# Patient Record
Sex: Male | Born: 1975 | Race: White | Hispanic: No | Smoking: Former smoker
Health system: Southern US, Community
[De-identification: ages and names within clinical notes are randomized; demographics above are authoritative.]

## PROBLEM LIST (undated history)

## (undated) DIAGNOSIS — M109 Gout, unspecified: Secondary | ICD-10-CM

## (undated) DIAGNOSIS — G6181 Chronic inflammatory demyelinating polyneuritis: Secondary | ICD-10-CM

## (undated) DIAGNOSIS — F1010 Alcohol abuse, uncomplicated: Secondary | ICD-10-CM

## (undated) HISTORY — PX: FOOT SURGERY: SHX648

---

## 2001-02-12 ENCOUNTER — Encounter: Payer: Self-pay | Admitting: Podiatry

## 2001-02-16 ENCOUNTER — Encounter: Payer: Self-pay | Admitting: Podiatry

## 2001-02-16 ENCOUNTER — Ambulatory Visit (HOSPITAL_COMMUNITY): Admission: RE | Admit: 2001-02-16 | Discharge: 2001-02-16 | Payer: Self-pay | Admitting: Podiatry

## 2006-06-29 ENCOUNTER — Emergency Department (HOSPITAL_COMMUNITY): Admission: EM | Admit: 2006-06-29 | Discharge: 2006-06-29 | Payer: Self-pay | Admitting: Emergency Medicine

## 2014-01-29 ENCOUNTER — Emergency Department (HOSPITAL_COMMUNITY): Payer: 59

## 2014-01-29 ENCOUNTER — Emergency Department (HOSPITAL_COMMUNITY)
Admission: EM | Admit: 2014-01-29 | Discharge: 2014-01-29 | Disposition: A | Discharge status: Home / Self Care | Payer: 59 | Attending: Emergency Medicine | Admitting: Emergency Medicine

## 2014-01-29 ENCOUNTER — Encounter (HOSPITAL_COMMUNITY): Payer: Self-pay | Admitting: Emergency Medicine

## 2014-01-29 DIAGNOSIS — Z87891 Personal history of nicotine dependence: Secondary | ICD-10-CM | POA: Insufficient documentation

## 2014-01-29 DIAGNOSIS — M25469 Effusion, unspecified knee: Secondary | ICD-10-CM | POA: Insufficient documentation

## 2014-01-29 DIAGNOSIS — M25461 Effusion, right knee: Secondary | ICD-10-CM

## 2014-01-29 DIAGNOSIS — Z862 Personal history of diseases of the blood and blood-forming organs and certain disorders involving the immune mechanism: Secondary | ICD-10-CM | POA: Insufficient documentation

## 2014-01-29 DIAGNOSIS — Z8639 Personal history of other endocrine, nutritional and metabolic disease: Secondary | ICD-10-CM | POA: Insufficient documentation

## 2014-01-29 HISTORY — DX: Gout, unspecified: M10.9

## 2014-01-29 MED ORDER — IBUPROFEN 800 MG PO TABS
800.0000 mg | ORAL_TABLET | Freq: Once | ORAL | Status: AC
  Administered 2014-01-29: 800 mg via ORAL
  Filled 2014-01-29: qty 1

## 2014-01-29 MED ORDER — PREDNISONE 50 MG PO TABS: 50.0000 mg | ORAL_TABLET | Freq: Every day | ORAL | Status: DC

## 2014-01-29 MED ORDER — ACETAMINOPHEN 325 MG PO TABS
975.0000 mg | ORAL_TABLET | Freq: Once | ORAL | Status: AC
  Administered 2014-01-29: 975 mg via ORAL
  Filled 2014-01-29: qty 3

## 2014-01-29 MED ORDER — IBUPROFEN 800 MG PO TABS: 800.0000 mg | ORAL_TABLET | Freq: Three times a day (TID) | ORAL | Status: DC | PRN

## 2014-01-29 NOTE — Discharge Instructions (Signed)
Return here as needed.  Followup with the orthopedist provided for further care and evaluation.  Ice and elevate her knee and wear the knee immobilizer and use crutches.

## 2014-01-29 NOTE — ED Notes (Signed)
Ortho tech at bedside 

## 2014-01-29 NOTE — ED Notes (Signed)
Patient reports that he ran on a treadmill 2 days ago and that afternoon his right knee was swollen.

## 2014-01-29 NOTE — ED Provider Notes (Signed)
CSN: [id number]     Arrival date & time 01/29/14  1039 History   First MD Initiated Contact with Patient 01/29/14 1114     Chief Complaint  Patient presents with  . Joint Swelling     (Consider location/radiation/quality/duration/timing/severity/associated sxs/prior Treatment) HPI Patient presents to the emergency department with swelling to his right knee that started 2 days, ago.  The patient, states, that he was using a treadmill and shortly thereafter his noticed his knee became stiff.  Patient, states he did not fall or injure the knee in any way.  The patient didn't feel any pain while running.  The patient denies nausea, vomiting, shortness of breath, numbness, weakness, dizziness, fever, back pain, or syncope.  The patient, states, that he's had a history of gout in the past and this feels somewhat similar but not completely like his previous gout episodes. Past Medical History  Diagnosis Date  . Gout    Past Surgical History  Procedure Laterality Date  . Foot surgery     Family History  Problem Relation Age of Onset  . COPD Father    History  Substance Use Topics  . Smoking status: Former Games developermoker  . Smokeless tobacco: Never Used  . Alcohol Use: No    Review of Systems  All other systems negative except as documented in the HPI. All pertinent positives and negatives as reviewed in the HPI.   Allergies  Review of patient's allergies indicates no known allergies.  Home Medications   Prior to Admission medications   Medication Sig Start Date End Date Taking? Authorizing Provider  ibuprofen (ADVIL,MOTRIN) 200 MG tablet Take 800 mg by mouth every 6 (six) hours as needed for moderate pain.   Yes Historical Provider, MD  Multiple Vitamin (MULTIVITAMIN WITH MINERALS) TABS tablet Take 1 tablet by mouth daily.   Yes Historical Provider, MD   BP 127/74  Pulse 64  Temp(Src) 98.9 F (37.2 C) (Oral)  Resp 18  Ht 6\' 2"  (1.88 m)  Wt 240 lb (108.863 kg)  BMI 30.80 kg/m2   SpO2 95% Physical Exam  Nursing note and vitals reviewed. Constitutional: He is oriented to person, place, and time. He appears well-developed and well-nourished. No distress.  HENT:  Head: Normocephalic and atraumatic.  Eyes: Pupils are equal, round, and reactive to light.  Pulmonary/Chest: Effort normal. No respiratory distress.  Musculoskeletal:       Right knee: He exhibits decreased range of motion, swelling and effusion. He exhibits no ecchymosis, no deformity, no laceration, no erythema, normal alignment, no LCL laxity, normal patellar mobility, no bony tenderness, normal meniscus and no MCL laxity. Tenderness found. Medial joint line and lateral joint line tenderness noted.  Neurological: He is alert and oriented to person, place, and time. He exhibits normal muscle tone. Coordination normal.  Skin: Skin is warm and dry. No erythema.    ED Course  Procedures (including critical care time) Labs Review Labs Reviewed - No data to display  Imaging Review Dg Knee Complete 4 Views Right  01/29/2014   CLINICAL DATA:  pain and swelling a  EXAM: RIGHT KNEE - COMPLETE 4+ VIEW  COMPARISON:  None.  FINDINGS: There is no evidence of fracture or dislocation. Moderate effusion in the suprapatellar bursa. Ossicles at the tibial tubercle. There is no evidence of arthropathy or other focal bone abnormality. Soft tissues are unremarkable.  IMPRESSION: 1. Moderate joint effusion without fracture or other acute bone abnormality.   Electronically Signed   By: Oley Balmaniel  Hassell  M.D.   On: 01/29/2014 11:54     Patient be placed in a knee immobilizer and given crutches.  Told to ice and elevate the knee.  Patient will be referred to orthopedics for further evaluation and care.  Told to return here as needed.  Prescription represent a bursitis versus gout flare.  Carlyle Dollyhristopher W Edie Darley, PA-C 01/29/14 1232

## 2014-01-29 NOTE — ED Provider Notes (Signed)
Medical screening examination/treatment/procedure(s) were performed by non-physician practitioner and as supervising physician I was immediately available for consultation/collaboration.   EKG Interpretation None      Devoria AlbeIva Knapp, MD, Armando GangFACEPWard Givens'  Iva L Knapp, MD 01/29/14 [phone]

## 2014-01-29 NOTE — ED Notes (Addendum)
Per pt on treadmill 2 days ago with increased stiffness and swelling to right knee since event. No redness but slight warmth at site.   PA at  Bedside and aware.

## 2014-01-29 NOTE — ED Notes (Signed)
Pt states he does not want anything additional for pain.

## 2014-01-29 NOTE — ED Notes (Signed)
Pt transported to xray. Will administer pain medication and ice with pt return.

## 2014-11-01 ENCOUNTER — Encounter: Payer: Self-pay | Admitting: Orthopedic Surgery

## 2015-02-09 IMAGING — CR DG KNEE COMPLETE 4+V*R*
4 series · 4 of 4 positions shown · non-contrast
Comparison: None.

CLINICAL DATA: pain and swelling a

EXAM:
RIGHT KNEE - COMPLETE 4+ VIEW

[t knee obl right (1 of 3)]
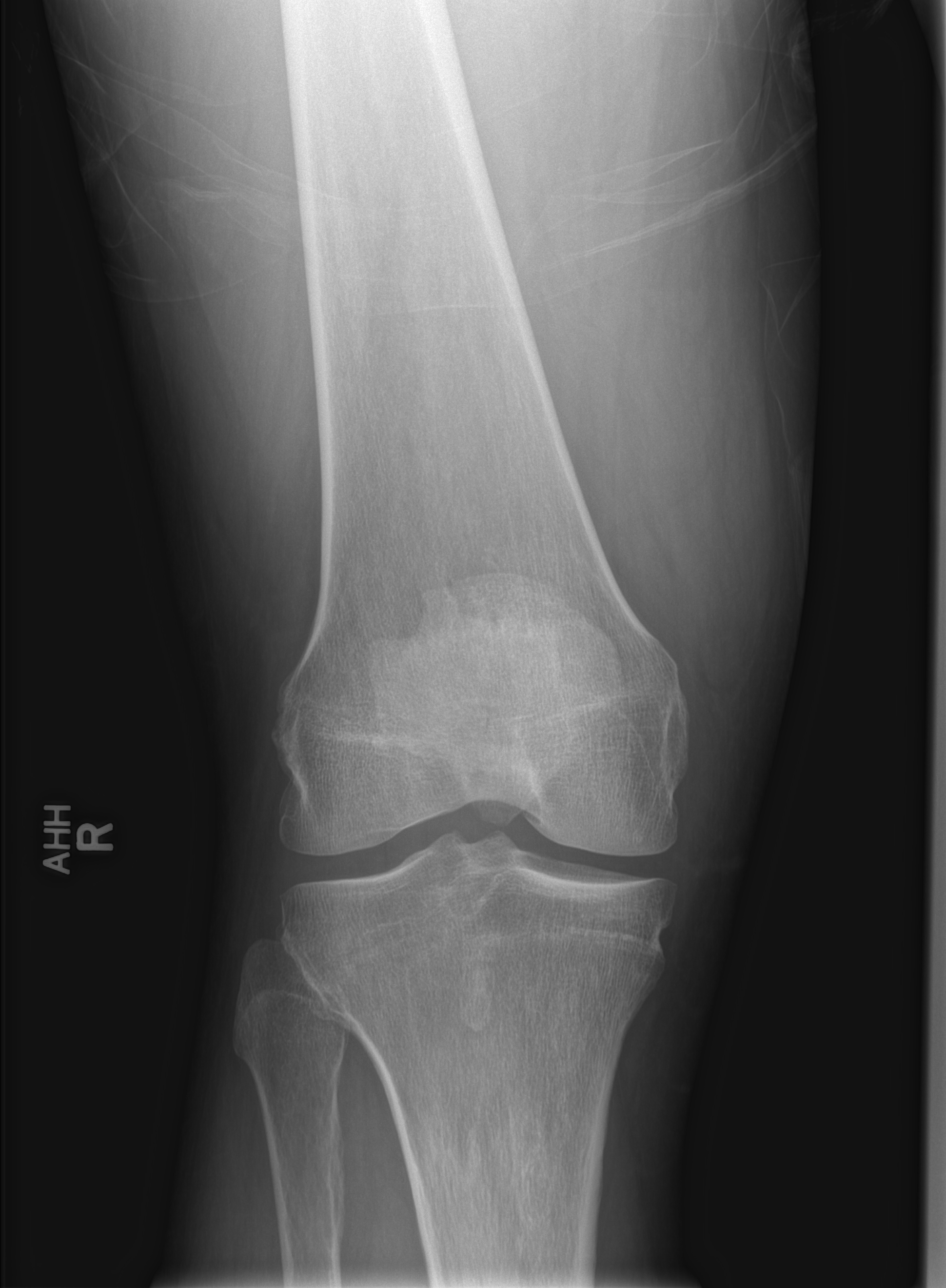

[t knee obl right (2 of 3)]
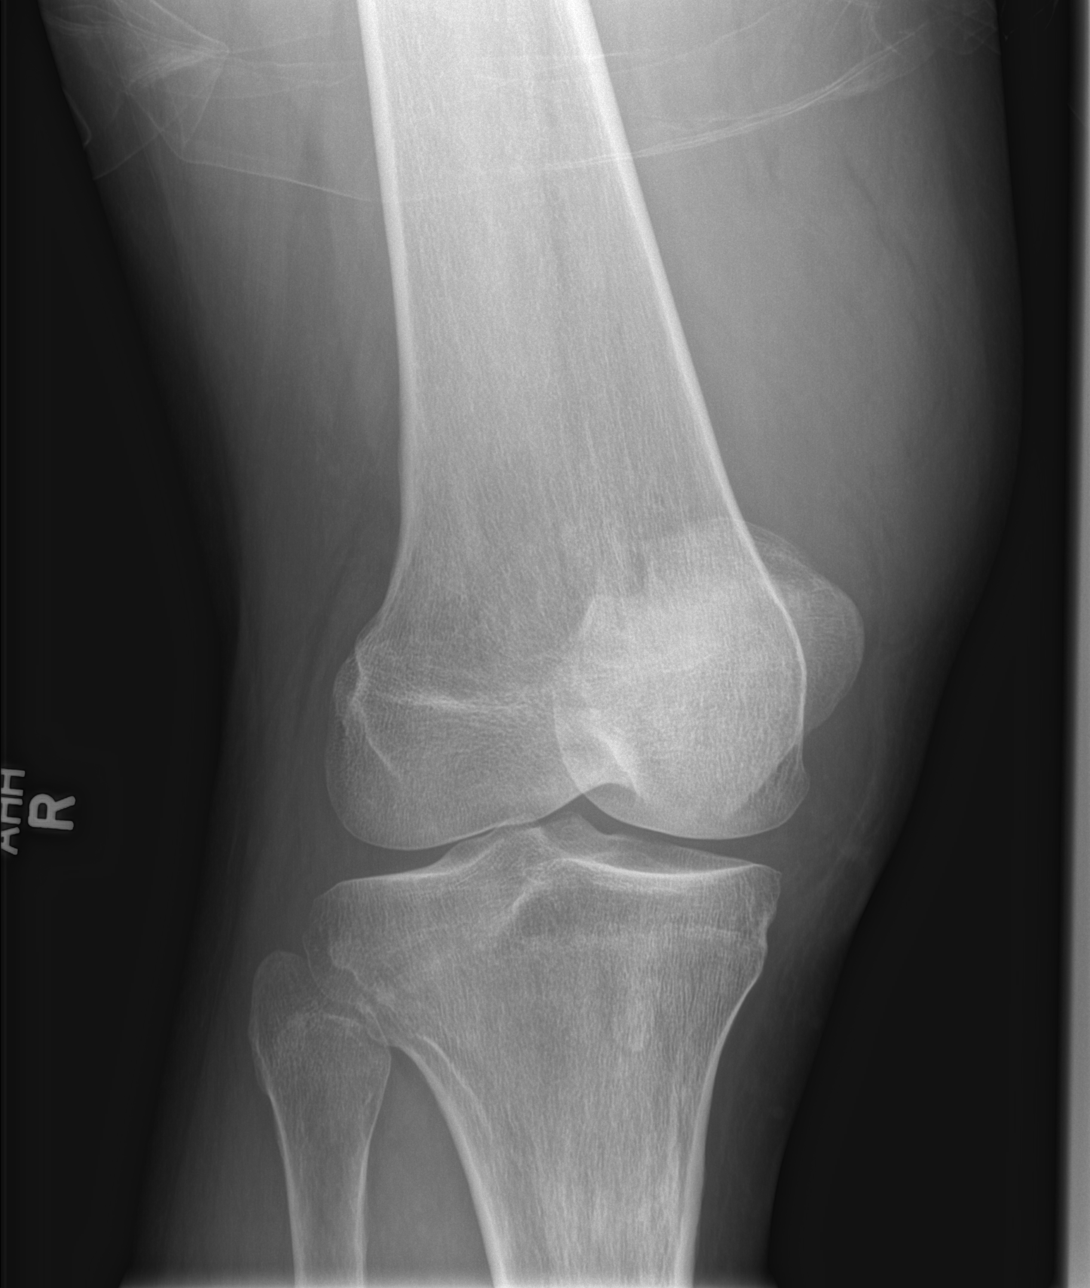

[t knee obl right (3 of 3)]
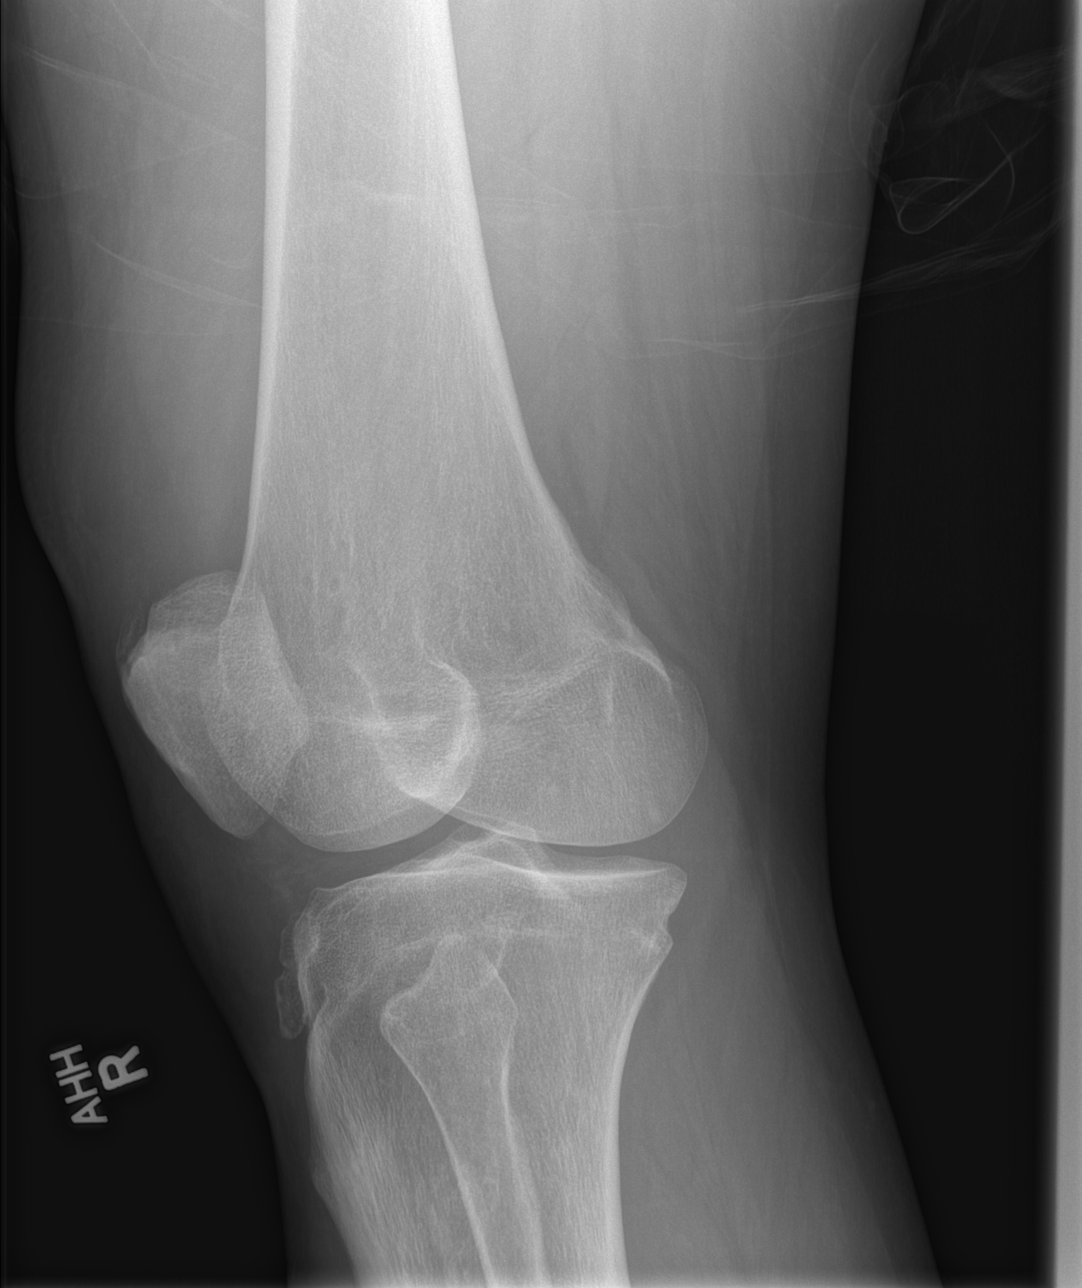

[t knee lat right]
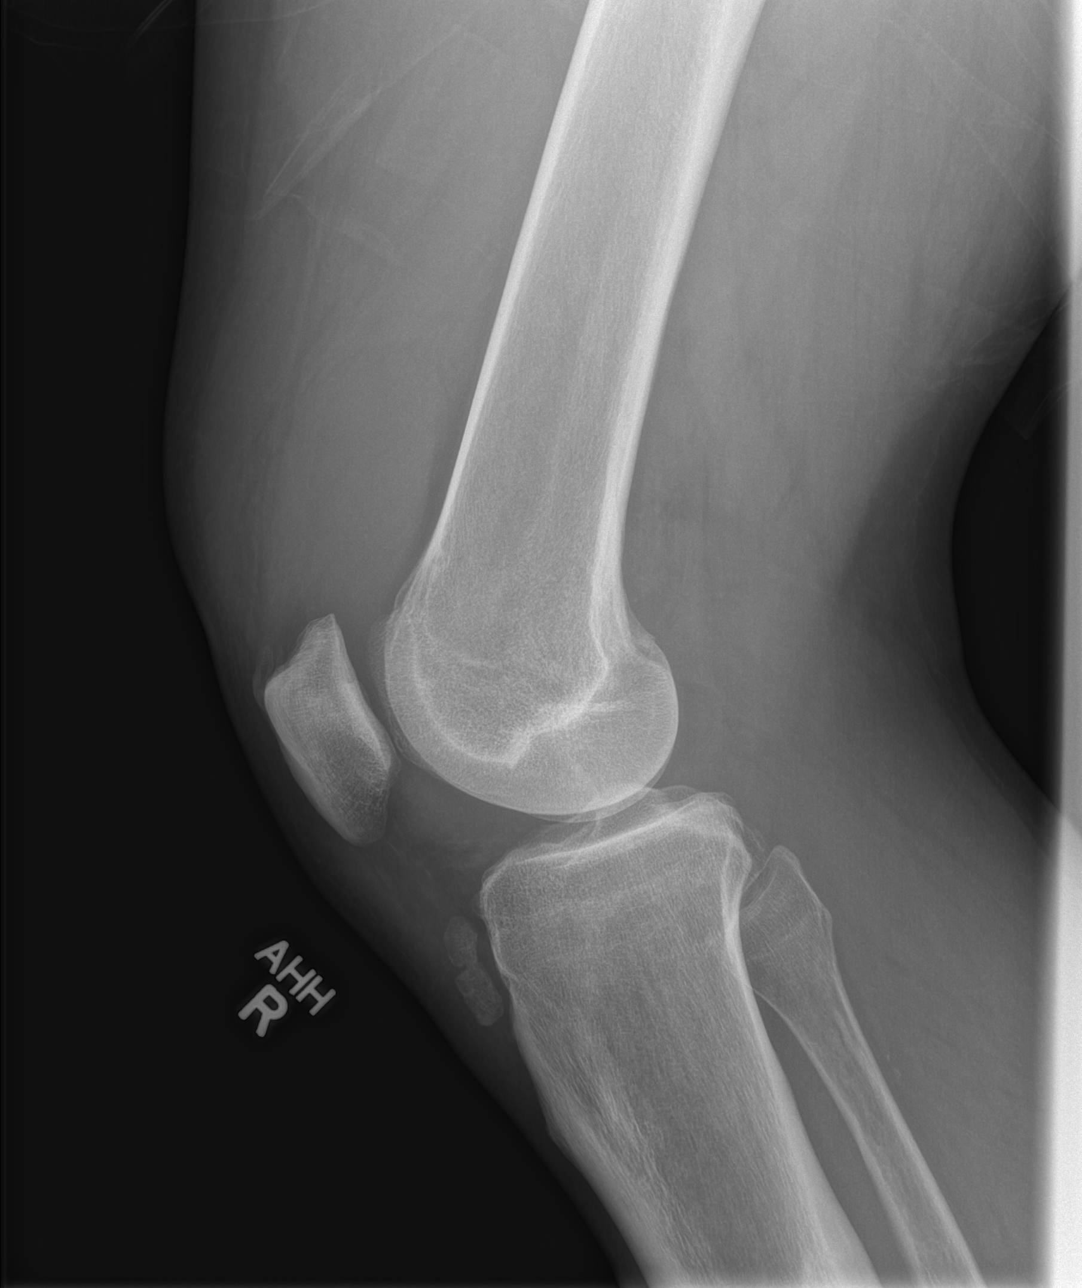

[4 of 4 positions shown; findings below may reference images not displayed]

FINDINGS: There is no evidence of fracture or dislocation. Moderate effusion
in the suprapatellar bursa. Ossicles at the tibial tubercle. There
is no evidence of arthropathy or other focal bone abnormality. Soft
tissues are unremarkable.
IMPRESSION: 1. Moderate joint effusion without fracture or other acute bone
abnormality.

## 2016-09-30 DIAGNOSIS — M109 Gout, unspecified: Secondary | ICD-10-CM | POA: Diagnosis not present

## 2016-11-06 DIAGNOSIS — M109 Gout, unspecified: Secondary | ICD-10-CM | POA: Diagnosis not present

## 2016-11-06 DIAGNOSIS — R7301 Impaired fasting glucose: Secondary | ICD-10-CM | POA: Diagnosis not present

## 2016-11-06 DIAGNOSIS — E782 Mixed hyperlipidemia: Secondary | ICD-10-CM | POA: Diagnosis not present

## 2016-11-08 DIAGNOSIS — R7301 Impaired fasting glucose: Secondary | ICD-10-CM | POA: Diagnosis not present

## 2016-11-08 DIAGNOSIS — E782 Mixed hyperlipidemia: Secondary | ICD-10-CM | POA: Diagnosis not present

## 2016-11-08 DIAGNOSIS — Z0000 Encounter for general adult medical examination without abnormal findings: Secondary | ICD-10-CM | POA: Diagnosis not present

## 2017-01-22 DIAGNOSIS — M109 Gout, unspecified: Secondary | ICD-10-CM | POA: Diagnosis not present

## 2017-02-06 DIAGNOSIS — E782 Mixed hyperlipidemia: Secondary | ICD-10-CM | POA: Diagnosis not present

## 2017-02-06 DIAGNOSIS — R7301 Impaired fasting glucose: Secondary | ICD-10-CM | POA: Diagnosis not present

## 2017-02-06 DIAGNOSIS — R5383 Other fatigue: Secondary | ICD-10-CM | POA: Diagnosis not present

## 2017-02-10 DIAGNOSIS — E782 Mixed hyperlipidemia: Secondary | ICD-10-CM | POA: Diagnosis not present

## 2017-02-10 DIAGNOSIS — R7301 Impaired fasting glucose: Secondary | ICD-10-CM | POA: Diagnosis not present

## 2017-02-10 DIAGNOSIS — E790 Hyperuricemia without signs of inflammatory arthritis and tophaceous disease: Secondary | ICD-10-CM | POA: Diagnosis not present

## 2017-08-13 DIAGNOSIS — R7301 Impaired fasting glucose: Secondary | ICD-10-CM | POA: Diagnosis not present

## 2017-08-13 DIAGNOSIS — M109 Gout, unspecified: Secondary | ICD-10-CM | POA: Diagnosis not present

## 2017-08-13 DIAGNOSIS — E782 Mixed hyperlipidemia: Secondary | ICD-10-CM | POA: Diagnosis not present

## 2017-08-13 DIAGNOSIS — E790 Hyperuricemia without signs of inflammatory arthritis and tophaceous disease: Secondary | ICD-10-CM | POA: Diagnosis not present

## 2017-08-13 DIAGNOSIS — E6609 Other obesity due to excess calories: Secondary | ICD-10-CM | POA: Diagnosis not present

## 2018-01-19 DIAGNOSIS — R7301 Impaired fasting glucose: Secondary | ICD-10-CM | POA: Diagnosis not present

## 2018-01-19 DIAGNOSIS — E782 Mixed hyperlipidemia: Secondary | ICD-10-CM | POA: Diagnosis not present

## 2018-01-19 DIAGNOSIS — Z125 Encounter for screening for malignant neoplasm of prostate: Secondary | ICD-10-CM | POA: Diagnosis not present

## 2018-01-22 DIAGNOSIS — R7301 Impaired fasting glucose: Secondary | ICD-10-CM | POA: Diagnosis not present

## 2018-01-22 DIAGNOSIS — E790 Hyperuricemia without signs of inflammatory arthritis and tophaceous disease: Secondary | ICD-10-CM | POA: Diagnosis not present

## 2018-05-07 DIAGNOSIS — Z0289 Encounter for other administrative examinations: Secondary | ICD-10-CM | POA: Diagnosis not present

## 2018-08-12 DIAGNOSIS — R7301 Impaired fasting glucose: Secondary | ICD-10-CM | POA: Diagnosis not present

## 2018-08-12 DIAGNOSIS — E6609 Other obesity due to excess calories: Secondary | ICD-10-CM | POA: Diagnosis not present

## 2018-08-12 DIAGNOSIS — E782 Mixed hyperlipidemia: Secondary | ICD-10-CM | POA: Diagnosis not present

## 2018-08-24 DIAGNOSIS — Z0000 Encounter for general adult medical examination without abnormal findings: Secondary | ICD-10-CM | POA: Diagnosis not present

## 2018-08-24 DIAGNOSIS — E782 Mixed hyperlipidemia: Secondary | ICD-10-CM | POA: Diagnosis not present

## 2018-08-24 DIAGNOSIS — M109 Gout, unspecified: Secondary | ICD-10-CM | POA: Diagnosis not present

## 2018-08-24 DIAGNOSIS — E669 Obesity, unspecified: Secondary | ICD-10-CM | POA: Diagnosis not present

## 2022-05-24 ENCOUNTER — Other Ambulatory Visit (HOSPITAL_COMMUNITY): Payer: Self-pay

## 2022-05-24 MED ORDER — WEGOVY 0.25 MG/0.5ML ~~LOC~~ SOAJ
0.2500 mg | SUBCUTANEOUS | 0 refills | Status: DC
  Filled 2022-05-24 – 2022-05-31 (×2): qty 2, 28d supply, fill #0

## 2022-05-24 MED ORDER — WEGOVY 1 MG/0.5ML ~~LOC~~ SOAJ
1.0000 mg | SUBCUTANEOUS | 2 refills | Status: DC
  Filled 2022-05-24 – 2022-05-31 (×2): qty 2, 28d supply, fill #0

## 2022-05-24 MED ORDER — WEGOVY 0.5 MG/0.5ML ~~LOC~~ SOAJ
0.5000 mg | SUBCUTANEOUS | 0 refills | Status: DC
  Filled 2022-05-24 – 2022-06-04 (×2): qty 2, 28d supply, fill #0

## 2022-05-31 ENCOUNTER — Other Ambulatory Visit (HOSPITAL_COMMUNITY): Payer: Self-pay

## 2022-06-04 ENCOUNTER — Other Ambulatory Visit (HOSPITAL_COMMUNITY): Payer: Self-pay

## 2022-09-24 ENCOUNTER — Other Ambulatory Visit (HOSPITAL_COMMUNITY): Payer: Self-pay | Admitting: Internal Medicine

## 2022-09-24 DIAGNOSIS — E782 Mixed hyperlipidemia: Secondary | ICD-10-CM

## 2022-11-14 ENCOUNTER — Ambulatory Visit (HOSPITAL_COMMUNITY)
Admission: RE | Admit: 2022-11-14 | Discharge: 2022-11-14 | Disposition: A | Discharge status: Home / Self Care | Payer: Self-pay | Source: Ambulatory Visit | Attending: Internal Medicine | Admitting: Internal Medicine

## 2022-11-14 DIAGNOSIS — E782 Mixed hyperlipidemia: Secondary | ICD-10-CM | POA: Insufficient documentation

## 2022-12-10 ENCOUNTER — Other Ambulatory Visit (HOSPITAL_COMMUNITY): Payer: Self-pay | Admitting: Internal Medicine

## 2022-12-10 ENCOUNTER — Encounter (HOSPITAL_COMMUNITY): Payer: Self-pay | Admitting: Internal Medicine

## 2022-12-10 DIAGNOSIS — R945 Abnormal results of liver function studies: Secondary | ICD-10-CM

## 2022-12-11 ENCOUNTER — Encounter: Payer: Self-pay | Admitting: Internal Medicine

## 2022-12-11 ENCOUNTER — Ambulatory Visit: Payer: 59 | Attending: Internal Medicine | Admitting: Internal Medicine

## 2022-12-11 VITALS — BP 128/88 | HR 101 | Ht 74.0 in | Wt 245.2 lb

## 2022-12-11 DIAGNOSIS — E8809 Other disorders of plasma-protein metabolism, not elsewhere classified: Secondary | ICD-10-CM

## 2022-12-11 DIAGNOSIS — Z136 Encounter for screening for cardiovascular disorders: Secondary | ICD-10-CM

## 2022-12-11 DIAGNOSIS — E785 Hyperlipidemia, unspecified: Secondary | ICD-10-CM | POA: Insufficient documentation

## 2022-12-11 DIAGNOSIS — E46 Unspecified protein-calorie malnutrition: Secondary | ICD-10-CM | POA: Insufficient documentation

## 2022-12-11 DIAGNOSIS — M7989 Other specified soft tissue disorders: Secondary | ICD-10-CM | POA: Diagnosis not present

## 2022-12-11 DIAGNOSIS — Z8249 Family history of ischemic heart disease and other diseases of the circulatory system: Secondary | ICD-10-CM | POA: Insufficient documentation

## 2022-12-11 DIAGNOSIS — E7849 Other hyperlipidemia: Secondary | ICD-10-CM | POA: Diagnosis not present

## 2022-12-11 DIAGNOSIS — G603 Idiopathic progressive neuropathy: Secondary | ICD-10-CM

## 2022-12-11 DIAGNOSIS — G629 Polyneuropathy, unspecified: Secondary | ICD-10-CM | POA: Insufficient documentation

## 2022-12-11 DIAGNOSIS — E781 Pure hyperglyceridemia: Secondary | ICD-10-CM | POA: Insufficient documentation

## 2022-12-11 NOTE — Progress Notes (Signed)
Cardiology Office Note  Date: 12/11/2022   ID: CUTBERTO RICCA, DOB [birth year], MRN [id number]  PCP:  Donnetta Hail, MD  Cardiologist:  Marjo Bicker, MD Electrophysiologist:  None   Reason for Office Visit: CAD screening at the request of Dr. Margo Aye   History of Present Illness: Connor Copeland is a 47 y.o. male with no PMH was referred to cardiology clinic for CAD screening.  Patient has family history of CAD but not premature. His father had PCI/CABG in his late 31s.  Patient denied having any symptoms of angina, DOE, syncope, palpitations.  He does have leg swelling for some time. 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  Recently, in the last 3 to 6 months, he started noticing burning, tingling, paresthesias in bilateral lower extremities making him hard to walk.  He used to smoke cigarettes but not anymore.  Past Medical History:  Diagnosis Date   Gout     Current Outpatient Medications  Medication Sig Dispense Refill   Multiple Vitamin (MULTIVITAMIN WITH MINERALS) TABS tablet Take 1 tablet by mouth daily.     No current facility-administered medications for this visit.   Allergies:  Patient has no known allergies.   Social History: The patient  reports that he has quit smoking. He has never used smokeless tobacco. He reports that he does not drink alcohol and does not use drugs.   Family History: The patient's family history includes COPD in his father.   ROS:  Please see the history of present illness. Otherwise, complete review of systems is positive for none  All other systems are reviewed and negative.   Physical Exam: VS:  BP 128/88   Pulse (!) 101   Ht 6\' 2"  (1.88 m)   Wt 245 lb 3.2 oz (111.2 kg)   SpO2 96%   BMI 31.48 kg/m , BMI Body mass index is 31.48 kg/m.  Wt Readings from Last 3 Encounters:  12/11/22 245 lb 3.2 oz (111.2 kg)  01/29/14 240 lb (108.9 kg)    General: Patient appears  comfortable at rest. HEENT: Conjunctiva and lids normal, oropharynx clear with moist mucosa. Neck: Supple, no elevated JVP or carotid bruits, no thyromegaly. Lungs: Clear to auscultation, nonlabored breathing at rest. Cardiac: Regular rate and rhythm, no S3 or significant systolic murmur, no pericardial rub. Abdomen: Soft, nontender, no hepatomegaly, bowel sounds present, no guarding or rebound. Extremities: 1-2+ pitting edema, distal pulses 2+. Skin: Warm and dry. Musculoskeletal: No kyphosis. Neuropsychiatric: Alert and oriented x3, affect grossly appropriate.  Recent Labwork: No results found for requested labs within last 365 days.  No results found for: "CHOL", "TRIG", "HDL", "CHOLHDL", "VLDL", "LDLCALC", "LDLDIRECT"  Other Studies Reviewed Today: I personally reviewed the labs  Assessment and Plan: Patient is a 47 year old M with no PMH was referred to cardiology clinic for screening of CAD.  # Family history of CAD # HLD, hypertriglyceridemia -Obtain CT calcium scoring of coronaries. I recommended to start moderate intensity statin but patient wants to try diet and exercise before starting statin. Follow-up with PCP to repeat lipid panel in 3 months and start statin if triglycerides are more than 150.  # Bilateral lower EXTR swelling likely secondary to hypoalbuminemia, but rule out heart failure: Obtain 2D echocardiogram.  # Peripheral neuropathy: Obtain ABI with ultrasound arterial Doppler lower extremities.  If ABIs are normal, he probably might benefit from neurology referral.  I have spent a total of 45 minutes with patient reviewing chart, EKGs, labs and examining patient as well as establishing an assessment and plan that was discussed with the patient.  > 50% of time was spent in direct patient care.    Medication Adjustments/Labs and Tests Ordered: Current medicines are reviewed at length with the patient today.  Concerns regarding medicines are outlined above.    Tests Ordered: Orders Placed This Encounter  Procedures   US ARTERIAL SEG MULTIPLE LE (ABI, SEGMENTAL PRESSURES, PVR'S)   EKG 12-Lead   ECHOCARDIOGRAM COMPLETE    Medication Changes: No orders of the defined types were placed in this encounter.   Disposition:  Follow up  6 months  Signed Demarques Pilz Verne Spurr, MD, 12/11/2022 2:21 PM    [facility] at [facility] [address] [place], [place], [place] [address]

## 2022-12-11 NOTE — Patient Instructions (Addendum)
Medication Instructions:  Your physician recommends that you continue on your current medications as directed. Please refer to the Current Medication list given to you today.  Labwork: none  Testing/Procedures: Your physician has requested that you have an echocardiogram. Echocardiography is a painless test that uses sound waves to create images of your heart. It provides your doctor with information about the size and shape of your heart and how well your heart's chambers and valves are working. This procedure takes approximately one hour. There are no restrictions for this procedure. Please do NOT wear cologne, perfume, aftershave, or lotions (deodorant is allowed). Please arrive 15 minutes prior to your appointment time. Your physician has requested that you have an ankle brachial index (ABI). During this test an ultrasound and blood pressure cuff are used to evaluate the arteries that supply the arms and legs with blood. Allow thirty minutes for this exam. There are no restrictions or special instructions.  Follow-Up: Your physician recommends that you schedule a follow-up appointment in: 6 months  Any Other Special Instructions Will Be Listed Below (If Applicable).  If you need a refill on your cardiac medications before your next appointment, please call your pharmacy.

## 2022-12-12 ENCOUNTER — Emergency Department (HOSPITAL_COMMUNITY): Payer: 59

## 2022-12-12 ENCOUNTER — Emergency Department (HOSPITAL_COMMUNITY)
Admission: EM | Admit: 2022-12-12 | Discharge: 2022-12-12 | Disposition: A | Discharge status: Home / Self Care | Payer: 59 | Attending: Emergency Medicine | Admitting: Emergency Medicine

## 2022-12-12 ENCOUNTER — Other Ambulatory Visit: Payer: Self-pay

## 2022-12-12 DIAGNOSIS — R209 Unspecified disturbances of skin sensation: Secondary | ICD-10-CM | POA: Insufficient documentation

## 2022-12-12 DIAGNOSIS — R2243 Localized swelling, mass and lump, lower limb, bilateral: Secondary | ICD-10-CM | POA: Diagnosis present

## 2022-12-12 DIAGNOSIS — R600 Localized edema: Secondary | ICD-10-CM | POA: Insufficient documentation

## 2022-12-12 LAB — COMPREHENSIVE METABOLIC PANEL
ALT: 40 U/L (ref 0–44)
AST: 36 U/L (ref 15–41)
Albumin: 3.1 g/dL — ABNORMAL LOW (ref 3.5–5.0)
Alkaline Phosphatase: 74 U/L (ref 38–126)
Anion gap: 10 (ref 5–15)
BUN: 11 mg/dL (ref 6–20)
CO2: 26 mmol/L (ref 22–32)
Calcium: 8.4 mg/dL — ABNORMAL LOW (ref 8.9–10.3)
Chloride: 100 mmol/L (ref 98–111)
Creatinine, Ser: 0.63 mg/dL (ref 0.61–1.24)
GFR, Estimated: >60 mL/min (ref >60)
Glucose, Bld: 92 mg/dL (ref 70–99)
Potassium: 3.7 mmol/L (ref 3.5–5.1)
Sodium: 136 mmol/L (ref 135–145)
Total Bilirubin: 0.8 mg/dL (ref 0.3–1.2)
Total Protein: 5.6 g/dL — ABNORMAL LOW (ref 6.5–8.1)

## 2022-12-12 LAB — URINALYSIS, ROUTINE W REFLEX MICROSCOPIC
Bilirubin Urine: NEGATIVE
Glucose, UA: NEGATIVE mg/dL
Hgb urine dipstick: NEGATIVE
Ketones, ur: NEGATIVE mg/dL
Leukocytes,Ua: NEGATIVE
Nitrite: NEGATIVE
Protein, ur: NEGATIVE mg/dL
Specific Gravity, Urine: 1.014 (ref 1.005–1.030)
pH: 6 (ref 5.0–8.0)

## 2022-12-12 LAB — CBC
HCT: 39.1 % (ref 39.0–52.0)
Hemoglobin: 13.3 g/dL (ref 13.0–17.0)
MCH: 35.1 pg — ABNORMAL HIGH (ref 26.0–34.0)
MCHC: 34 g/dL (ref 30.0–36.0)
MCV: 103.2 fL — ABNORMAL HIGH (ref 80.0–100.0)
Platelets: 396 10*3/uL (ref 150–400)
RBC: 3.79 MIL/uL — ABNORMAL LOW (ref 4.22–5.81)
RDW: 14.5 % (ref 11.5–15.5)
WBC: 8 10*3/uL (ref 4.0–10.5)
nRBC: 0 % (ref 0.0–0.2)

## 2022-12-12 LAB — D-DIMER, QUANTITATIVE: D-Dimer, Quant: 0.78 ug/mL-FEU — ABNORMAL HIGH (ref 0.00–0.50)

## 2022-12-12 LAB — BRAIN NATRIURETIC PEPTIDE: B Natriuretic Peptide: 107 pg/mL — ABNORMAL HIGH (ref 0.0–100.0)

## 2022-12-12 MED ORDER — HYDROCODONE-ACETAMINOPHEN 5-325 MG PO TABS
2.0000 | ORAL_TABLET | Freq: Once | ORAL | Status: AC
  Administered 2022-12-12: 2 via ORAL
  Filled 2022-12-12: qty 2

## 2022-12-12 MED ORDER — FUROSEMIDE 20 MG PO TABS: 40.0000 mg | ORAL_TABLET | Freq: Every day | ORAL | 0 refills | Status: DC

## 2022-12-12 MED ORDER — FUROSEMIDE 10 MG/ML IJ SOLN
40.0000 mg | Freq: Once | INTRAMUSCULAR | Status: AC
Start: 2022-12-12 — End: 2022-12-12
  Administered 2022-12-12: 40 mg via INTRAVENOUS
  Filled 2022-12-12: qty 4

## 2022-12-12 NOTE — Discharge Instructions (Addendum)
Evaluation today was overall reassuring.  I am starting you on Lasix which is a diuretic to help with the swelling in your legs.  Recommend you follow-up with your cardiologist as you need further evaluation for heart failure.  Also recommend you follow-up with your PCP.  If you have new shortness of breath, chest pain or swelling worsens in your legs please return emergency department for further evaluation.

## 2022-12-12 NOTE — ED Provider Notes (Signed)
[place] EMERGENCY DEPARTMENT AT [facility] Provider Note   CSN: [id number] Arrival date & time: 12/12/22  2006     History  Chief Complaint  Patient presents with   Leg Swelling   Numbness   HPI Connor Copeland is a 47 y.o. male with history of hyperlipidemia, peripheral neuropathy, and hypoalbuminemia and gout presenting for bilateral leg swelling and numbness.  States he noticed some pain tingling like sensation in both legs starting in the upper leg extending down to the toes about a week ago.  Has progressively worsened.  Woke up this morning and noticed significant swelling around both ankles extending up to the upper leg about the lower thigh and both legs.  Denies chest pain shortness of breath.  Denies urinary changes.  States now unable to ambulate around the house because of the pain and swelling.  HPI     Home Medications Prior to Admission medications   Medication Sig Start Date End Date Taking? Authorizing Provider  furosemide (LASIX) 20 MG tablet Take 2 tablets (40 mg total) by mouth daily for 3 days. 12/12/22 12/15/22 Yes Gareth Eagle, PA-C  gabapentin (NEURONTIN) 300 MG capsule Take 300 mg by mouth 3 (three) times daily. 12/09/22  Yes [provider]  Multiple Vitamin (MULTIVITAMIN WITH MINERALS) TABS tablet Take 1 tablet by mouth daily.   Yes [provider]  terbinafine (LAMISIL) 250 MG tablet Take 250 mg by mouth daily. Patient not taking: Reported on 12/12/2022 09/24/22   [provider]      Allergies    Patient has no known allergies.    Review of Systems   See HPI for pertinent positives   Physical Exam   Vitals:   12/12/22 2303 12/12/22 2304  BP: (!) 146/107 (!) 146/101  Pulse: 88 92  Resp: 15 17  Temp:    SpO2: 99% 100%    CONSTITUTIONAL:  well-appearing, NAD NEURO:  Alert and oriented x 3, CN 3-12 grossly intact EYES:  eyes equal and reactive ENT/NECK:  Supple, no stridor  CARDIO:  regular rate  and rhythm, appears well-perfused  PULM:  No respiratory distress, CTAB GI/GU:  non-distended, soft MSK/SPINE: 2+ pitting edema around his ankles extending upward towards the mid thigh bilaterally, no gross deformities, moves all extremities, able to ambulate. Gait is steady. SKIN:  no rash, atraumatic  *Additional and/or pertinent findings included in MDM below   ED Results / Procedures / Treatments   Labs (all labs ordered are listed, but only abnormal results are displayed) Labs Reviewed  CBC - Abnormal; Notable for the following components:      Result Value   RBC 3.79 (*)    MCV 103.2 (*)    MCH 35.1 (*)    All other components within normal limits  COMPREHENSIVE METABOLIC PANEL - Abnormal; Notable for the following components:   Calcium 8.4 (*)    Total Protein 5.6 (*)    Albumin 3.1 (*)    All other components within normal limits  BRAIN NATRIURETIC PEPTIDE - Abnormal; Notable for the following components:   B Natriuretic Peptide 107.0 (*)    All other components within normal limits  D-DIMER, QUANTITATIVE - Abnormal; Notable for the following components:   D-Dimer, Quant 0.78 (*)    All other components within normal limits  URINALYSIS, ROUTINE W REFLEX MICROSCOPIC    EKG EKG Interpretation  Date/Time:  Thursday Dec 12 2022 21:45:47 EDT Ventricular Rate:  88 PR Interval:  143 QRS  Duration: 101 QT Interval:  409 QTC Calculation: 495 R Axis:   94 Text Interpretation: Sinus rhythm Low voltage, extremity leads Borderline prolonged QT interval Confirmed by Benjiman Core [phone]) on 12/12/2022 10:41:44 PM  Radiology DG Chest Portable 1 View  Result Date: 12/12/2022 CLINICAL DATA:  Bilateral ankle swelling. EXAM: PORTABLE CHEST 1 VIEW COMPARISON:  None Available. FINDINGS: The heart size and mediastinal contours are within normal limits. Both lungs are clear. The visualized skeletal structures are unremarkable. IMPRESSION: No active disease. Electronically Signed    By: Aram Candela M.D.   On: 12/12/2022 22:08    Procedures Procedures    Medications Ordered in ED Medications  furosemide (LASIX) injection 40 mg (40 mg Intravenous Given 12/12/22 2255)  HYDROcodone-acetaminophen (NORCO/VICODIN) 5-325 MG per tablet 2 tablet (2 tablets Oral Given 12/12/22 2333)    ED Course/ Medical Decision Making/ A&P Clinical Course as of 12/13/22 1554  Thu Dec 12, 2022  2205 DG Chest Portable 1 View [JR]    Clinical Course User Index [JR] Gareth Eagle, PA-C                             Medical Decision Making Amount and/or Complexity of Data Reviewed Labs: ordered. Radiology: ordered. Decision-making details documented in ED Course.  Risk Prescription drug management.   Initial Impression and Ddx 47 year old well-appearing male presenting for lower extremity edema.  Exam notable for 2+ pitting edema around his ankles extending upward towards the mid thigh bilaterally.  DDx includes DVT, CHF exacerbation, venous stasis, renal failure and PE. Patient PMH that increases complexity of ED encounter:  hyperlipidemia, peripheral neuropathy, and hypoalbuminemia and gout  Interpretation of Diagnostics I independent reviewed and interpreted the labs as followed: Elevated BNP and D-dimer  - I independently visualized the following imaging with scope of interpretation limited to determining acute life threatening conditions related to emergency care: No acute findings on chest x-ray  -I independently interpreted and reviewed EKG which revealed sinus rhythm  Patient Reassessment and Ultimate Disposition/Management Treated with IV Lasix and with good result. Upon reassessment there was at least a liter of urine output noted.  Patient also mentioned that the swelling folic it had improved.  D-dimer was elevated but suspicion for DVT was unlikely given the swelling was bilateral, PE also unlikely given no chest pain shortness of breath and patient not tachycardic.   BNP was elevated along with his lower extremity edema, there is some concern for new onset heart failure.  Per chart review it appears that cardiology is following him for this concern.  Has a scheduled echocardiogram next week.  Given overall how clinically well he appears without shortness of breath or chest pain, thought it was appropriate for him to be discharged given his close follow-up.  Sent home on 3-day course of Lasix.  Advised to follow-up with cardiology.  Vital stable discharge.  Discussed pertinent return precautions.  Patient management required discussion with the following services or consulting groups:  None  Complexity of Problems Addressed Acute complicated illness or Injury  Additional Data Reviewed and Analyzed Further history obtained from: Past medical history and medications listed in the EMR, Prior ED visit notes, and Recent Consult notes  Patient Encounter Risk Assessment Prescriptions         Final Clinical Impression(s) / ED Diagnoses Final diagnoses:  Bilateral lower extremity edema    Rx / DC Orders ED Discharge Orders  Ordered    furosemide (LASIX) 20 MG tablet  Daily        12/12/22 2330              Gareth Eagle, PA-C 12/13/22 1554    Benjiman Core, MD 12/13/22 2245

## 2022-12-12 NOTE — ED Triage Notes (Signed)
Pt presents with bilateral ankle swelling that started today. Pt states that that he has some numbness in both legs and that it is getting difficult to walk. Pt has not been feeling good last few weeks with fatigue and not being able to eat much. Also endorses pain in bilateral legs.

## 2022-12-17 ENCOUNTER — Ambulatory Visit (HOSPITAL_COMMUNITY)
Admission: RE | Admit: 2022-12-17 | Discharge: 2022-12-17 | Disposition: A | Discharge status: Home / Self Care | Payer: 59 | Source: Ambulatory Visit | Attending: Internal Medicine | Admitting: Internal Medicine

## 2022-12-17 ENCOUNTER — Other Ambulatory Visit: Payer: Self-pay | Admitting: Internal Medicine

## 2022-12-17 DIAGNOSIS — G629 Polyneuropathy, unspecified: Secondary | ICD-10-CM | POA: Insufficient documentation

## 2022-12-17 DIAGNOSIS — G603 Idiopathic progressive neuropathy: Secondary | ICD-10-CM

## 2022-12-17 DIAGNOSIS — E7849 Other hyperlipidemia: Secondary | ICD-10-CM

## 2022-12-17 DIAGNOSIS — E8809 Other disorders of plasma-protein metabolism, not elsewhere classified: Secondary | ICD-10-CM

## 2022-12-17 DIAGNOSIS — Z8249 Family history of ischemic heart disease and other diseases of the circulatory system: Secondary | ICD-10-CM

## 2022-12-17 DIAGNOSIS — M7989 Other specified soft tissue disorders: Secondary | ICD-10-CM

## 2022-12-17 DIAGNOSIS — Z136 Encounter for screening for cardiovascular disorders: Secondary | ICD-10-CM

## 2022-12-17 DIAGNOSIS — E781 Pure hyperglyceridemia: Secondary | ICD-10-CM

## 2022-12-31 ENCOUNTER — Ambulatory Visit (HOSPITAL_COMMUNITY)
Admission: RE | Admit: 2022-12-31 | Discharge: 2022-12-31 | Disposition: A | Discharge status: Home / Self Care | Payer: 59 | Source: Ambulatory Visit | Attending: Internal Medicine | Admitting: Internal Medicine

## 2022-12-31 DIAGNOSIS — R945 Abnormal results of liver function studies: Secondary | ICD-10-CM | POA: Insufficient documentation

## 2023-01-10 ENCOUNTER — Other Ambulatory Visit (HOSPITAL_BASED_OUTPATIENT_CLINIC_OR_DEPARTMENT_OTHER): Payer: Self-pay

## 2023-01-10 ENCOUNTER — Emergency Department (HOSPITAL_BASED_OUTPATIENT_CLINIC_OR_DEPARTMENT_OTHER): Payer: 59 | Admitting: Radiology

## 2023-01-10 ENCOUNTER — Encounter (HOSPITAL_BASED_OUTPATIENT_CLINIC_OR_DEPARTMENT_OTHER): Payer: Self-pay | Admitting: Emergency Medicine

## 2023-01-10 ENCOUNTER — Other Ambulatory Visit: Payer: Self-pay

## 2023-01-10 ENCOUNTER — Emergency Department (HOSPITAL_BASED_OUTPATIENT_CLINIC_OR_DEPARTMENT_OTHER)
Admission: EM | Admit: 2023-01-10 | Discharge: 2023-01-10 | Disposition: A | Discharge status: Home / Self Care | Payer: 59 | Attending: Emergency Medicine | Admitting: Emergency Medicine

## 2023-01-10 DIAGNOSIS — K760 Fatty (change of) liver, not elsewhere classified: Secondary | ICD-10-CM

## 2023-01-10 DIAGNOSIS — G9009 Other idiopathic peripheral autonomic neuropathy: Secondary | ICD-10-CM | POA: Diagnosis present

## 2023-01-10 DIAGNOSIS — B370 Candidal stomatitis: Secondary | ICD-10-CM | POA: Insufficient documentation

## 2023-01-10 DIAGNOSIS — R748 Abnormal levels of other serum enzymes: Secondary | ICD-10-CM | POA: Diagnosis not present

## 2023-01-10 DIAGNOSIS — G609 Hereditary and idiopathic neuropathy, unspecified: Secondary | ICD-10-CM

## 2023-01-10 LAB — CBC WITH DIFFERENTIAL/PLATELET
Abs Immature Granulocytes: 0.04 10*3/uL (ref 0.00–0.07)
Basophils Absolute: 0.1 10*3/uL (ref 0.0–0.1)
Basophils Relative: 1 %
Eosinophils Absolute: 0 10*3/uL (ref 0.0–0.5)
Eosinophils Relative: 0 %
HCT: 41 % (ref 39.0–52.0)
Hemoglobin: 14.7 g/dL (ref 13.0–17.0)
Immature Granulocytes: 1 %
Lymphocytes Relative: 33 %
Lymphs Abs: 2.9 10*3/uL (ref 0.7–4.0)
MCH: 34.4 pg — ABNORMAL HIGH (ref 26.0–34.0)
MCHC: 35.9 g/dL (ref 30.0–36.0)
MCV: 96 fL (ref 80.0–100.0)
Monocytes Absolute: 0.9 10*3/uL (ref 0.1–1.0)
Monocytes Relative: 10 %
Neutro Abs: 4.9 10*3/uL (ref 1.7–7.7)
Neutrophils Relative %: 55 %
Platelets: 306 10*3/uL (ref 150–400)
RBC: 4.27 MIL/uL (ref 4.22–5.81)
RDW: 13.8 % (ref 11.5–15.5)
WBC: 8.8 10*3/uL (ref 4.0–10.5)
nRBC: 0 % (ref 0.0–0.2)

## 2023-01-10 LAB — URINALYSIS, ROUTINE W REFLEX MICROSCOPIC
Bilirubin Urine: NEGATIVE
Glucose, UA: NEGATIVE mg/dL
Hgb urine dipstick: NEGATIVE
Ketones, ur: NEGATIVE mg/dL
Leukocytes,Ua: NEGATIVE
Nitrite: NEGATIVE
Protein, ur: NEGATIVE mg/dL
Specific Gravity, Urine: 1.013 (ref 1.005–1.030)
pH: 5.5 (ref 5.0–8.0)

## 2023-01-10 LAB — RAPID HIV SCREEN (HIV 1/2 AB+AG)
HIV 1/2 Antibodies: NONREACTIVE
HIV-1 P24 Antigen - HIV24: NONREACTIVE

## 2023-01-10 LAB — COMPREHENSIVE METABOLIC PANEL
ALT: 28 U/L (ref 0–44)
AST: 35 U/L (ref 15–41)
Albumin: 3.2 g/dL — ABNORMAL LOW (ref 3.5–5.0)
Alkaline Phosphatase: 63 U/L (ref 38–126)
Anion gap: 14 (ref 5–15)
BUN: 9 mg/dL (ref 6–20)
CO2: 26 mmol/L (ref 22–32)
Calcium: 8 mg/dL — ABNORMAL LOW (ref 8.9–10.3)
Chloride: 100 mmol/L (ref 98–111)
Creatinine, Ser: 0.66 mg/dL (ref 0.61–1.24)
GFR, Estimated: >60 mL/min (ref >60)
Glucose, Bld: 91 mg/dL (ref 70–99)
Potassium: 3.8 mmol/L (ref 3.5–5.1)
Sodium: 140 mmol/L (ref 135–145)
Total Bilirubin: 0.8 mg/dL (ref 0.3–1.2)
Total Protein: 5.3 g/dL — ABNORMAL LOW (ref 6.5–8.1)

## 2023-01-10 LAB — LIPASE, BLOOD: Lipase: 88 U/L — ABNORMAL HIGH (ref 11–51)

## 2023-01-10 LAB — VITAMIN B12: Vitamin B-12: 485 pg/mL (ref 180–914)

## 2023-01-10 LAB — PROTIME-INR
INR: 1.1 (ref 0.8–1.2)
Prothrombin Time: 14.8 seconds (ref 11.4–15.2)

## 2023-01-10 LAB — CBG MONITORING, ED: Glucose-Capillary: 110 mg/dL — ABNORMAL HIGH (ref 70–99)

## 2023-01-10 MED ORDER — FENTANYL CITRATE PF 50 MCG/ML IJ SOSY
50.0000 ug | PREFILLED_SYRINGE | Freq: Once | INTRAMUSCULAR | Status: AC
  Administered 2023-01-10: 50 ug via INTRAVENOUS
  Filled 2023-01-10: qty 1

## 2023-01-10 MED ORDER — HYDROCODONE-ACETAMINOPHEN 5-325 MG PO TABS: 1.0000 | ORAL_TABLET | Freq: Four times a day (QID) | ORAL | 0 refills | Status: DC | PRN

## 2023-01-10 MED ORDER — HYDROMORPHONE HCL 1 MG/ML IJ SOLN
0.5000 mg | Freq: Once | INTRAMUSCULAR | Status: AC
  Administered 2023-01-10: 0.5 mg via INTRAVENOUS
  Filled 2023-01-10: qty 1

## 2023-01-10 MED ORDER — CLOTRIMAZOLE 10 MG MT TROC: 10.0000 mg | Freq: Every day | OROMUCOSAL | 0 refills | Status: AC

## 2023-01-10 NOTE — Discharge Instructions (Signed)
Please read and follow all provided instructions.  Your diagnoses today include:  1. Idiopathic peripheral neuropathy   2. Hepatic steatosis   3. Oral thrush     Tests performed today include: Blood cell counts and electrolytes: No problems Protein levels remain slightly low Pancreas enzyme test was slightly elevated Chest x-ray was clear Vital signs. See below for your results today.   Medications prescribed:  Clotrimazole: Medication for oral thrush  Vicodin (hydrocodone/acetaminophen) - narcotic pain medication  DO NOT drive or perform any activities that require you to be awake and alert because this medicine can make you drowsy. BE VERY CAREFUL not to take multiple medicines containing Tylenol (also called acetaminophen). Doing so can lead to an overdose which can damage your liver and cause liver failure and possibly death.  Do not combine with alcohol.  Take any prescribed medications only as directed.  Home care instructions:  Follow any educational materials contained in this packet.  Follow-up instructions: Please follow-up with neurology and your primary care doctor in the next 1 week.  I have placed a referral to neurology on your behalf today.  Return instructions:  Please return to the Emergency Department if you experience worsening symptoms.  If you cannot manage at home prior to your appointment, please go to [facility] as you would not likely need to be transferred in order to see a neurologist if symptoms were to worsen.  Otherwise go to the nearest emergency department for treatment if you are unable to travel. Return if you have weakness in your arms or legs, slurred speech, trouble walking or talking, confusion, or trouble with your balance.  Please return if you have any other emergent concerns.  Additional Information:  Your vital signs today were: BP 119/85 (BP Location: Right Arm)   Pulse (!) 108   Temp 97.8 F (36.6 C) (Oral)   Resp 16    Wt 96.2 kg   SpO2 97%   BMI 27.22 kg/m  If your blood pressure (BP) was elevated above 135/85 this visit, please have this repeated by your doctor within one month. --------------

## 2023-01-10 NOTE — ED Notes (Signed)
Pt informed to not get up and ambulate on his own due to the meds that were given.... Pt still ambulating on his own.Marland KitchenMarland Kitchen

## 2023-01-10 NOTE — ED Notes (Signed)
Pt states he was hospitalized about 2 weeks ago due to reaction to gabapentin ( bilateral lower extr edema )) and was treated with lasix

## 2023-01-10 NOTE — ED Triage Notes (Signed)
Pt has been having bilateral leg/feet numbness and hands for about a month and a half. Primary sent him to rheumatologist. Was seen today, upon assessing him was told to come to ED because there was a rash in his mouth/and also his lungs sounded "raspy".

## 2023-01-10 NOTE — ED Provider Notes (Signed)
[place] EMERGENCY DEPARTMENT AT [facility] Provider Note   CSN: [id number] Arrival date & time: 01/10/23  0930     History  No chief complaint on file.   Connor Copeland is a 47 y.o. male.  Patient with history of hypertriglyceridemia, recent workup for leg swelling with normal ABIs, incidental aortic aneurysm noted on ultrasound as well as hepatic steatosis --presents to the emergency department after seeing [facility] rheumatology today.  Patient's symptoms started about 6 weeks ago.  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  He developed numbness in his bilateral legs symmetrically.  He also developed generalized weakness in his legs and had difficulty walking, so he has not been able to work since shortly after the onset of his symptoms.  Not too long after the numbness developed in his legs, he developed numbness in his bilateral hands.  He followed with Dr. Margo Aye in [place] who has done a workup but at this point, but patient does not currently have a diagnosis.  From his PCP he was referred to rheumatology, who was reportedly concerned about weight loss and development of oral thrush and sent him to the emergency department.  Patient has had about a 25 pound weight loss over the past 2 months.  Family has had to help him with ADLs due to his disability.  He denies any tick bites or rashes.  He has not had a fever or URI symptoms.  No headache, neck pain or back pain.  No difficulty with controlling urine.  Patient was placed on gabapentin at 1 point and had an ED visit due to leg swelling.  The symptoms were resolved after discontinuation of gabapentin.  Currently he is on Ativan to help him sleep, hydrocodone for pain related to neuropathy.  He states that he was unable to get into a neurologist until August. He does drink alcohol daily (bourbon).        Home Medications Prior to Admission medications   Medication Sig Start Date End Date Taking? Authorizing  Provider  HYDROcodone-acetaminophen (NORCO) 7.5-325 MG tablet Take by mouth. 12/27/22  Yes [provider]  LORazepam (ATIVAN) 0.5 MG tablet Take 0.5 mg by mouth daily. 12/27/22  Yes [provider]  potassium chloride SA (KLOR-CON M) 20 MEQ tablet Take 20 mEq by mouth daily. 12/14/22  Yes [provider]  furosemide (LASIX) 20 MG tablet Take 2 tablets (40 mg total) by mouth daily for 3 days. 12/12/22 12/15/22  Gareth Eagle, PA-C  gabapentin (NEURONTIN) 300 MG capsule Take 300 mg by mouth 3 (three) times daily. 12/09/22   [provider]  Multiple Vitamin (MULTIVITAMIN WITH MINERALS) TABS tablet Take 1 tablet by mouth daily.    [provider]  terbinafine (LAMISIL) 250 MG tablet Take 250 mg by mouth daily. Patient not taking: Reported on 12/12/2022 09/24/22   [provider]      Allergies    Gabapentin    Review of Systems   Review of Systems  Physical Exam Updated Vital Signs BP (!) 127/95   Pulse (!) 104   Temp 97.8 F (36.6 C) (Oral)   Resp 16   Wt 96.2 kg   SpO2 99%   BMI 27.22 kg/m   Physical Exam Vitals and nursing note reviewed.  Constitutional:      Appearance: He is well-developed.  HENT:     Head: Normocephalic and atraumatic.     Right Ear: Tympanic membrane, ear canal and external ear normal.  Left Ear: Tympanic membrane, ear canal and external ear normal.     Nose: Nose normal.     Mouth/Throat:     Pharynx: Uvula midline. Oropharyngeal exudate present.     Comments: Patient with extensive thrush on the buccal mucosa, tongue, posterior oropharynx. Eyes:     General: Lids are normal.     Conjunctiva/sclera: Conjunctivae normal.     Pupils: Pupils are equal, round, and reactive to light.  Cardiovascular:     Rate and Rhythm: Normal rate and regular rhythm.  Pulmonary:     Effort: Pulmonary effort is normal.     Breath sounds: Normal breath sounds.  Abdominal:     Palpations: Abdomen is soft.      Tenderness: There is no abdominal tenderness.  Musculoskeletal:        General: Normal range of motion.     Cervical back: Normal range of motion and neck supple. No tenderness or bony tenderness.  Skin:    General: Skin is warm and dry.  Neurological:     Mental Status: He is alert and oriented to person, place, and time.     GCS: GCS eye subscore is 4. GCS verbal subscore is 5. GCS motor subscore is 6.     Cranial Nerves: No cranial nerve deficit.     Sensory: Sensory deficit present.     Motor: Weakness present. No abnormal muscle tone.     Coordination: Coordination normal.     Deep Tendon Reflexes: Reflexes are normal and symmetric.     Comments: Patient reports decreased sensation in the bilateral lower extremities up to the groin and cannot feel me pinching his lower legs.  Also has decreased sensation of his hands but normal sensation of the risks and proximal.  Trace weakness of the lower extremities.  He is able to slowly push himself up in the bed to reposition himself.  DTRs are dulled in the lower extremities, no clonus.     ED Results / Procedures / Treatments   Labs (all labs ordered are listed, but only abnormal results are displayed) Labs Reviewed  CBC WITH DIFFERENTIAL/PLATELET  COMPREHENSIVE METABOLIC PANEL  LIPASE, BLOOD  PROTIME-INR  RAPID HIV SCREEN (HIV 1/2 AB+AG)  URINALYSIS, ROUTINE W REFLEX MICROSCOPIC  VITAMIN B12  CBG MONITORING, ED    EKG None  Radiology No results found.  Procedures Procedures    Medications Ordered in ED Medications  fentaNYL (SUBLIMAZE) injection 50 mcg (has no administration in time range)    ED Course/ Medical Decision Making/ A&P    Patient seen and examined. History obtained directly from patient.  I also reviewed previous ED visits and outpatient cardiology workup.  Labs/EKG: Ordered CBC, CMP, lipase, PT/INR due to concern for liver dysfunction, HIV, B12.  Imaging: Ordered chest x-ray  Medications/Fluids:  Ordered: IV fentanyl for pain.   Most recent vital signs reviewed and are as follows: BP (!) 127/95   Pulse (!) 104   Temp 97.8 F (36.6 C) (Oral)   Resp 16   Wt 96.2 kg   SpO2 99%   BMI 27.22 kg/m   Initial impression: Peripheral neuropathy, thrush, weight loss, unclear etiology.  Patient discussed with and seen by Dr. Adela Lank.  12:55 PM Reassessment performed. Patient appears comfortable.  He was feeling better after additional dose of IV pain medicine.  Labs personally reviewed and interpreted including: CBC with normal white blood cell count, normal red blood cell count; CMP again with low protein and albumin otherwise unremarkable;  lipase minimally elevated at 88, HIV negative; UA negative.   Imaging personally visualized and interpreted including: Chest x-ray, agree negative  Most current vital signs reviewed and are as follows: BP 119/85 (BP Location: Right Arm)   Pulse (!) 108   Temp 97.8 F (36.6 C) (Oral)   Resp 16   Wt 96.2 kg   SpO2 97%   BMI 27.22 kg/m   I discussed the patient case with on-call neurology, Dr. Selina Cooley.  She agrees that patient needs close outpatient neurology follow-up and consideration for EMG.  Discussed that this could not be done as an inpatient and given subacute nature of symptoms, do not feel that patient would require inpatient admission based on current symptoms.  I asked about neuroimaging, and agrees that this does not need to be done emergently.  I have discussed lab findings with patient and wife at bedside.  Discussed ability to place neurology referral.  Also recommended GI follow-up given weight loss and hepatic steatosis found on recent ultrasound.  Discussed that alcohol could be contributing to some of the symptoms.  Patient's wife voices some apprehension about being able to help take care of patient at home, however they are both agreeable to discharge currently.  I discussed with them that if he did have progressive symptoms or  could not make it at home until his neuro follow-up, that he should go to [facility] as logistically this would be simpler to obtain neurology consultation.  However if travel is a barrier, he should go to the nearest emergency department.  They voiced understanding and agree with plan.  Plan for discharge to home.  Will give oral clotrimazole for candidiasis.  Patient states that he is running low on home pain medication and he will be given # 6 tablets of hydrocodone.  Discussed that I cannot give a large amount of pain medication from the emergency department.  Discussed limited utility of OTC meds and narcotic pain medications for neuropathic type of pain.  Patient counseled to return if they have weakness in their arms or legs, slurred speech, trouble walking or talking, confusion, trouble with their balance, or if they have any other concerns. Patient verbalizes understanding and agrees with plan.   Discussed follow-up with PCP and neurology within the next 1 week, also encouraged to call and schedule appointment with GI.                              Medical Decision Making Amount and/or Complexity of Data Reviewed Labs: ordered. Radiology: ordered.  Risk Prescription drug management.   Patient with approximately 6 weeks of peripheral neuropathy involving the bilateral legs and bilateral hands.  He has mild weakness.  He was sent in today due to concerns from rheumatologist.  It is unclear as to the underlying cause of the patient's symptoms currently.  He will need additional workup as an outpatient.  I completely agree with neurology follow-up.  Discussed with on-call neurology today and at this point no indication for inpatient admission.  I have placed ambulatory referral and patient and wife are comfortable with this close follow-up.  He may also need GI follow-up given findings on recent ultrasound as well as weight loss.  Other than alcohol use, it is unclear what may tie these  individual symptoms together.    The patient's vital signs, pertinent lab work and imaging were reviewed and interpreted as discussed in the ED course. Hospitalization  was considered for further testing, treatments, or serial exams/observation. However as patient is well-appearing, has a stable exam, and reassuring studies today, I do not feel that they warrant admission at this time. This plan was discussed with the patient who verbalizes agreement and comfort with this plan and seems reliable and able to return to the Emergency Department with worsening or changing symptoms.          Final Clinical Impression(s) / ED Diagnoses Final diagnoses:  Idiopathic peripheral neuropathy  Hepatic steatosis  Oral thrush    Rx / DC Orders ED Discharge Orders          Ordered    clotrimazole (MYCELEX) 10 MG troche  5 times daily        01/10/23 1248    HYDROcodone-acetaminophen (NORCO/VICODIN) 5-325 MG tablet  Every 6 hours PRN        01/10/23 1248    Ambulatory referral to Neurology       Comments: An appointment is requested in approximately: 1 week, for progressive peripheral neuropathy   01/10/23 1249              Renne Crigler, PA-C 01/10/23 1301    Melene Plan, DO 01/10/23 1422

## 2023-01-15 ENCOUNTER — Encounter: Payer: Self-pay | Admitting: Internal Medicine

## 2023-01-21 ENCOUNTER — Encounter: Payer: Self-pay | Admitting: Neurology

## 2023-01-21 ENCOUNTER — Ambulatory Visit (INDEPENDENT_AMBULATORY_CARE_PROVIDER_SITE_OTHER): Payer: 59 | Admitting: Neurology

## 2023-01-21 VITALS — BP 114/83 | HR 120 | Ht 74.0 in | Wt 228.0 lb

## 2023-01-21 DIAGNOSIS — G629 Polyneuropathy, unspecified: Secondary | ICD-10-CM

## 2023-01-21 DIAGNOSIS — R208 Other disturbances of skin sensation: Secondary | ICD-10-CM

## 2023-01-21 DIAGNOSIS — M791 Myalgia, unspecified site: Secondary | ICD-10-CM | POA: Diagnosis not present

## 2023-01-21 MED ORDER — METHYLPREDNISOLONE 4 MG PO TABS: ORAL_TABLET | ORAL | 0 refills | Status: DC

## 2023-01-21 MED ORDER — LAMOTRIGINE 25 MG PO TABS: 25.0000 mg | ORAL_TABLET | Freq: Every day | ORAL | 3 refills | Status: DC

## 2023-01-21 MED ORDER — LAMOTRIGINE 100 MG PO TABS: ORAL_TABLET | ORAL | 5 refills | Status: DC

## 2023-01-21 NOTE — Progress Notes (Signed)
[facility]  PATIENT: Connor Copeland DOB: 1976-06-16  REFERRING DOCTOR OR PCP: Nita Sells, MD SOURCE: Patient, note from primary care, note from emergency room, laboratory results  _________________________________   HISTORICAL  CHIEF COMPLAINT:  Chief Complaint  Patient presents with   Room 11    Pt is here with his Mother. Pt states that his neuropathy started 2 months ago. Pt states that he has tingling in his arms and his legs that last all day. Pt states that his hands and feet feel numb and burning. Pt's mother states that pt fell yesterday. Pt is in a lot of pain right now.     HISTORY OF PRESENT ILLNESS:  I saw your patient, Connor Copeland, at [facility] for neurologic consultation regarding his pain and numbness that has been chronic over the last 2 months.  He is a 47 year old man who reports the onset of leg pain that started in both feet about 2 months ago.  He descroves the sensation as a numb burning that is intense.   Pain worsened and spread to include the entire legs and more recently into the arms, worse in his hands.  He feels every day is worse than the preceding day.     Besides pain in his skin he also notes pian in his muscles  Over the last 2 months he has presented to the emergency room twice (12/10/2022 and 01/10/2023) lab work showed normal ESR, CRP, B12, HIV.  Besides the pain, he notes weakness in his entire body and he feels fatigued.   He also notes balance is poor and he fell getting out of th bath tub yesterday.   He reports sleeping poorly due to pain with frequent awakenings.  Pain is a little better at night when he gets up and walks.     He took gabapentin but reports his legs swelled up when he took it.  Therefore, he stopped.  He also was prescribed hydrocodone bit feels it has not helped.     He has gout and has been prescribed colchicine but has not taken recently.   The gout has mostly affected  the knees and feet.    Current pain is completely different than his gout pain in the past.  No change in bladder frequency.      Labs: 12/10/2022:  ESR, CRP, Vit D and B12 were normal   01/10/2023:   B12, HIV, CBC/D were normal.   Calcium mildly reduced (but Albumin also mildly reduced)    REVIEW OF SYSTEMS: Constitutional: No fevers, chills, sweats, or change in appetite Eyes: No visual changes, double vision, eye pain Ear, nose and throat: No hearing loss, ear pain, nasal congestion, sore throat Cardiovascular: No chest pain, palpitations Respiratory:  No shortness of breath at rest or with exertion.   No wheezes GastrointestinaI: No nausea, vomiting, diarrhea, abdominal pain, fecal incontinence Genitourinary:  No dysuria, urinary retention or frequency.  No nocturia. Musculoskeletal: As above Integumentary: No rash, pruritus, skin lesions Neurological: as above Psychiatric: No depression at this time.  No anxiety Endocrine: No palpitations, diaphoresis, change in appetite, change in weigh or increased thirst Hematologic/Lymphatic:  No anemia, purpura, petechiae. Allergic/Immunologic: No itchy/runny eyes, nasal congestion, recent allergic reactions, rashes  ALLERGIES: Allergies  Allergen Reactions   Gabapentin Swelling    Swelling off the legs    HOME MEDICATIONS:  Current Outpatient Medications:    HYDROcodone-acetaminophen (NORCO) 7.5-325 MG tablet, Take by mouth., Disp: , Rfl:  Multiple Vitamin (MULTIVITAMIN WITH MINERALS) TABS tablet, Take 1 tablet by mouth daily., Disp: , Rfl:    potassium chloride SA (KLOR-CON M) 20 MEQ tablet, Take 20 mEq by mouth daily., Disp: , Rfl:    clotrimazole (MYCELEX) 10 MG troche, Take 1 tablet (10 mg total) by mouth 5 (five) times daily for 14 days. (Patient not taking: Reported on 01/21/2023), Disp: 70 tablet, Rfl: 0   furosemide (LASIX) 20 MG tablet, Take 2 tablets (40 mg total) by mouth daily for 3 days., Disp: 6 tablet, Rfl: 0    HYDROcodone-acetaminophen (NORCO/VICODIN) 5-325 MG tablet, Take 1 tablet by mouth every 6 (six) hours as needed for severe pain. (Patient not taking: Reported on 01/21/2023), Disp: 8 tablet, Rfl: 0   LORazepam (ATIVAN) 0.5 MG tablet, Take 0.5 mg by mouth daily. (Patient not taking: Reported on 01/21/2023), Disp: , Rfl:    terbinafine (LAMISIL) 250 MG tablet, Take 250 mg by mouth daily. (Patient not taking: Reported on 12/12/2022), Disp: , Rfl:   PAST MEDICAL HISTORY: Past Medical History:  Diagnosis Date   Gout     PAST SURGICAL HISTORY: Past Surgical History:  Procedure Laterality Date   FOOT SURGERY      FAMILY HISTORY: Family History  Problem Relation Age of Onset   COPD Father     SOCIAL HISTORY: Social History   Socioeconomic History   Marital status: Divorced    Spouse name: Not on file   Number of children: Not on file   Years of education: Not on file   Highest education level: Not on file  Occupational History   Not on file  Tobacco Use   Smoking status: Former   Smokeless tobacco: Never  Substance and Sexual Activity   Alcohol use: No   Drug use: No   Sexual activity: Not on file  Other Topics Concern   Not on file  Social History Narrative   Right Handed   No Caffeine    Social Determinants of Health   Financial Resource Strain: Not on file  Food Insecurity: Not on file  Transportation Needs: Not on file  Physical Activity: Not on file  Stress: Not on file  Social Connections: Not on file  Intimate Partner Violence: Not on file       PHYSICAL EXAM  Vitals:   01/21/23 1454  BP: 114/83  Pulse: (!) 120  Weight: 228 lb (103.4 kg)  Height: 6\' 2"  (1.88 m)    Body mass index is 29.27 kg/m.   General: The patient is well-developed and well-nourished and in no acute distress  HEENT:  Head is [address]/AT.  Sclera are anicteric.    Neck: No carotid bruits are noted.  The neck is nontender.  Good range of motion  Cardiovascular: The heart has a  regular rate and rhythm with a normal S1 and S2. There were no murmurs, gallops or rubs.    Skin: Extremities are without rash or  edema.  Musculoskeletal: He has some tenderness over the muscles  Neurologic Exam  Mental status: The patient is alert and oriented x 3 at the time of the examination. The patient has apparent normal recent and remote memory, with an apparently normal attention span and concentration ability.   Speech is normal.  Cranial nerves: Extraocular movements are full. Pupils are equal, round, and reactive to light and accomodation.  There is good facial sensation to soft touch bilaterally.Facial strength is normal.  Trapezius and sternocleidomastoid strength is normal. No dysarthria is noted.  The tongue is midline, and the patient has symmetric elevation of the soft palate. No obvious hearing deficits are noted.  Motor:  Muscle bulk is normal.   Tone is normal. Strength is  4+/5 in toe extension and normal/near normal elsewhere. .   Sensory: He reported reduced sensation to pinprick and vibration in hands, more sensation in forearms.   He reported absent  sensation to vibration at toes and ankles and better at knees   He reported reduced vibration to pinprick in  entire leg, absent at toes/ankles  Coordination: Cerebellar testing reveals good finger-nose-finger and heel-to-shin bilaterally.  Gait and station: Station is normal.   Gait is antalgic.  Tandem gait is wide but apparently due to pain.  Romberg is negative.   Reflexes: Deep tendon reflexes are symmetric and normal in arms but absent in knees and ankle.   Plantar responses are flexor.    DIAGNOSTIC DATA (LABS, IMAGING, TESTING) - I reviewed patient records, labs, notes, testing and imaging myself where available.  Lab Results  Component Value Date   WBC 8.8 01/10/2023   HGB 14.7 01/10/2023   HCT 41.0 01/10/2023   MCV 96.0 01/10/2023   PLT 306 01/10/2023      Component Value Date/Time   NA 140  01/10/2023 1017   K 3.8 01/10/2023 1017   CL 100 01/10/2023 1017   CO2 26 01/10/2023 1017   GLUCOSE 91 01/10/2023 1017   BUN 9 01/10/2023 1017   CREATININE 0.66 01/10/2023 1017   CALCIUM 8.0 (L) 01/10/2023 1017   PROT 5.3 (L) 01/10/2023 1017   ALBUMIN 3.2 (L) 01/10/2023 1017   AST 35 01/10/2023 1017   ALT 28 01/10/2023 1017   ALKPHOS 63 01/10/2023 1017   BILITOT 0.8 01/10/2023 1017   GFRNONAA >60 01/10/2023 1017    Lab Results  Component Value Date   VITAMINB12 485 01/10/2023   No results found for: "TSH"     ASSESSMENT AND PLAN  Polyneuropathy - Plan: Multiple Myeloma Panel (SPEP&IFE w/QIG), ANA+ENA+DNA/DS+Scl 70+SjoSSA/B, Rheumatoid factor, NCV with EMG(electromyography)  Myalgia - Plan: Multiple Myeloma Panel (SPEP&IFE w/QIG), ANA+ENA+DNA/DS+Scl 70+SjoSSA/B, Rheumatoid factor, MR CERVICAL SPINE WO CONTRAST, MR LUMBAR SPINE WO CONTRAST, NCV with EMG(electromyography), CK  Dysesthesia - Plan: Multiple Myeloma Panel (SPEP&IFE w/QIG), ANA+ENA+DNA/DS+Scl 70+SjoSSA/B, Rheumatoid factor, MR CERVICAL SPINE WO CONTRAST, MR LUMBAR SPINE WO CONTRAST   In summary, Connor Copeland is a 47 year old man with a 23-month history of pain and numbness that he feels is progressively worsening.  He also has noted some weakness and reduced balance.   The etiology of his symptoms is unclear.  Guillain-Barr syndrome is unlikely given the time course of steady progression over the last 2 months and a preponderance of pain over any of the symptoms.  We will check blood work for autoimmune processes and elevated CK.  I will check MRI of the cervical and lumbar spine to determine if there is a compressive neurogenic component.  Because he does have reduced reflexes in the legs (baseline is unknown) and reports numbness we will check NCV/EMG to further characterize.  He was unable to tolerate gabapentin.  To help with the dysesthesias we will start lamotrigine and titrate up to 100 mg p.o. twice daily.  I  advised him to stop the medication if a rash develops.  Additionally I will send in a steroid pack in case there is an inflammatory component to his pain.  Follow-up will be determined based on results of the studies and he should call  if he has significantly more numbness or if he feels his gait is worsening.  I did let him know that our office does not do chronic pain management.  Thank you for asking me to see Connor Copeland.  Please let me know if I will be of further assistance with him or other patients in the future.    Connor Copeland A. Epimenio Foot, MD, [facility] 01/21/2023, 3:01 PM Certified in Neurology, Clinical Neurophysiology, Sleep Medicine and Neuroimaging  [facility] [address], [address] [place], [place] [address] [phone]

## 2023-01-21 NOTE — Patient Instructions (Signed)
The pharmacy has the prescription for lamotrigine 100 mg tablets.  For 5 days, just take one half pill a day.  For the next 5 days, take one half pill twice a day.  Then start taking one pill twice a day from this point on.      If you get a rash, need to stop the medication and not take it again.

## 2023-01-22 ENCOUNTER — Ambulatory Visit (HOSPITAL_COMMUNITY)
Admission: RE | Admit: 2023-01-22 | Discharge: 2023-01-22 | Disposition: A | Discharge status: Home / Self Care | Payer: 59 | Source: Ambulatory Visit | Attending: Internal Medicine | Admitting: Internal Medicine

## 2023-01-22 DIAGNOSIS — M7989 Other specified soft tissue disorders: Secondary | ICD-10-CM | POA: Insufficient documentation

## 2023-01-22 LAB — ECHOCARDIOGRAM COMPLETE
AR max vel: 2.31 cm2
AV Area VTI: 2.78 cm2
AV Area mean vel: 2.59 cm2
AV Mean grad: 3 mmHg
AV Peak grad: 6.9 mmHg
Ao pk vel: 1.31 m/s
Area-P 1/2: 5.62 cm2
MV VTI: 3.38 cm2
S' Lateral: 2.6 cm

## 2023-01-22 LAB — MULTIPLE MYELOMA PANEL, SERUM

## 2023-01-22 LAB — ANA+ENA+DNA/DS+SCL 70+SJOSSA/B
ENA SM Ab Ser-aCnc: <0.2 AI (ref 0.0–0.9)
ENA SSB (LA) Ab: 0.5 AI (ref 0.0–0.9)
Scleroderma (Scl-70) (ENA) Antibody, IgG: <0.2 AI (ref 0.0–0.9)

## 2023-01-22 NOTE — Progress Notes (Signed)
*  PRELIMINARY RESULTS* Echocardiogram 2D Echocardiogram has been performed.  Carolyne Fiscal 01/22/2023, 11:07 AM

## 2023-01-25 LAB — ANA+ENA+DNA/DS+SCL 70+SJOSSA/B: dsDNA Ab: 2 IU/mL (ref 0–9)

## 2023-01-25 LAB — MULTIPLE MYELOMA PANEL, SERUM
IgM (Immunoglobulin M), Srm: 64 mg/dL (ref 20–172)
Total Protein: 5.4 g/dL — ABNORMAL LOW (ref 6.0–8.5)

## 2023-01-28 ENCOUNTER — Emergency Department (HOSPITAL_COMMUNITY): Payer: 59

## 2023-01-28 ENCOUNTER — Encounter (HOSPITAL_COMMUNITY): Payer: Self-pay

## 2023-01-28 ENCOUNTER — Other Ambulatory Visit: Payer: Self-pay

## 2023-01-28 ENCOUNTER — Inpatient Hospital Stay (HOSPITAL_COMMUNITY)
Admission: EM | Admit: 2023-01-28 | Discharge: 2023-02-03 | DRG: 101 | Disposition: A | Discharge status: Home Health | Payer: 59 | Attending: Internal Medicine | Admitting: Internal Medicine

## 2023-01-28 DIAGNOSIS — R410 Disorientation, unspecified: Secondary | ICD-10-CM | POA: Diagnosis present

## 2023-01-28 DIAGNOSIS — D539 Nutritional anemia, unspecified: Secondary | ICD-10-CM | POA: Diagnosis present

## 2023-01-28 DIAGNOSIS — G629 Polyneuropathy, unspecified: Secondary | ICD-10-CM | POA: Diagnosis present

## 2023-01-28 DIAGNOSIS — F1010 Alcohol abuse, uncomplicated: Secondary | ICD-10-CM | POA: Diagnosis not present

## 2023-01-28 DIAGNOSIS — E781 Pure hyperglyceridemia: Secondary | ICD-10-CM | POA: Diagnosis present

## 2023-01-28 DIAGNOSIS — Z79899 Other long term (current) drug therapy: Secondary | ICD-10-CM | POA: Diagnosis not present

## 2023-01-28 DIAGNOSIS — E8809 Other disorders of plasma-protein metabolism, not elsewhere classified: Secondary | ICD-10-CM | POA: Diagnosis present

## 2023-01-28 DIAGNOSIS — F17200 Nicotine dependence, unspecified, uncomplicated: Secondary | ICD-10-CM | POA: Diagnosis not present

## 2023-01-28 DIAGNOSIS — M109 Gout, unspecified: Secondary | ICD-10-CM | POA: Diagnosis present

## 2023-01-28 DIAGNOSIS — S01512A Laceration without foreign body of oral cavity, initial encounter: Secondary | ICD-10-CM | POA: Diagnosis present

## 2023-01-28 DIAGNOSIS — G609 Hereditary and idiopathic neuropathy, unspecified: Secondary | ICD-10-CM | POA: Diagnosis not present

## 2023-01-28 DIAGNOSIS — E46 Unspecified protein-calorie malnutrition: Secondary | ICD-10-CM | POA: Diagnosis present

## 2023-01-28 DIAGNOSIS — D649 Anemia, unspecified: Secondary | ICD-10-CM | POA: Diagnosis present

## 2023-01-28 DIAGNOSIS — R569 Unspecified convulsions: Secondary | ICD-10-CM | POA: Diagnosis not present

## 2023-01-28 DIAGNOSIS — Z888 Allergy status to other drugs, medicaments and biological substances status: Secondary | ICD-10-CM

## 2023-01-28 DIAGNOSIS — Z8249 Family history of ischemic heart disease and other diseases of the circulatory system: Secondary | ICD-10-CM

## 2023-01-28 DIAGNOSIS — G40509 Epileptic seizures related to external causes, not intractable, without status epilepticus: Principal | ICD-10-CM | POA: Diagnosis present

## 2023-01-28 DIAGNOSIS — F10939 Alcohol use, unspecified with withdrawal, unspecified: Principal | ICD-10-CM

## 2023-01-28 DIAGNOSIS — F17210 Nicotine dependence, cigarettes, uncomplicated: Secondary | ICD-10-CM | POA: Diagnosis present

## 2023-01-28 DIAGNOSIS — R7401 Elevation of levels of liver transaminase levels: Secondary | ICD-10-CM | POA: Diagnosis present

## 2023-01-28 DIAGNOSIS — E876 Hypokalemia: Secondary | ICD-10-CM | POA: Diagnosis not present

## 2023-01-28 DIAGNOSIS — E871 Hypo-osmolality and hyponatremia: Secondary | ICD-10-CM | POA: Diagnosis present

## 2023-01-28 DIAGNOSIS — F10288 Alcohol dependence with other alcohol-induced disorder: Secondary | ICD-10-CM | POA: Diagnosis present

## 2023-01-28 DIAGNOSIS — F10931 Alcohol use, unspecified with withdrawal delirium: Secondary | ICD-10-CM | POA: Diagnosis not present

## 2023-01-28 DIAGNOSIS — G603 Idiopathic progressive neuropathy: Secondary | ICD-10-CM | POA: Diagnosis not present

## 2023-01-28 DIAGNOSIS — E785 Hyperlipidemia, unspecified: Secondary | ICD-10-CM | POA: Diagnosis present

## 2023-01-28 DIAGNOSIS — M7989 Other specified soft tissue disorders: Secondary | ICD-10-CM | POA: Diagnosis present

## 2023-01-28 DIAGNOSIS — R718 Other abnormality of red blood cells: Secondary | ICD-10-CM | POA: Diagnosis present

## 2023-01-28 DIAGNOSIS — Z825 Family history of asthma and other chronic lower respiratory diseases: Secondary | ICD-10-CM

## 2023-01-28 DIAGNOSIS — D696 Thrombocytopenia, unspecified: Secondary | ICD-10-CM | POA: Diagnosis present

## 2023-01-28 DIAGNOSIS — F1091 Alcohol use, unspecified, in remission: Secondary | ICD-10-CM | POA: Diagnosis present

## 2023-01-28 HISTORY — DX: Alcohol abuse, uncomplicated: F10.10

## 2023-01-28 LAB — COMPREHENSIVE METABOLIC PANEL
ALT: 54 U/L — ABNORMAL HIGH (ref 0–44)
AST: 99 U/L — ABNORMAL HIGH (ref 15–41)
Albumin: 2.8 g/dL — ABNORMAL LOW (ref 3.5–5.0)
Alkaline Phosphatase: 84 U/L (ref 38–126)
Anion gap: 16 — ABNORMAL HIGH (ref 5–15)
BUN: 7 mg/dL (ref 6–20)
CO2: 22 mmol/L (ref 22–32)
Calcium: 7.4 mg/dL — ABNORMAL LOW (ref 8.9–10.3)
Chloride: 92 mmol/L — ABNORMAL LOW (ref 98–111)
Creatinine, Ser: 0.8 mg/dL (ref 0.61–1.24)
GFR, Estimated: >60 mL/min (ref >60)
Glucose, Bld: 97 mg/dL (ref 70–99)
Potassium: 3.5 mmol/L (ref 3.5–5.1)
Sodium: 130 mmol/L — ABNORMAL LOW (ref 135–145)
Total Bilirubin: 1.9 mg/dL — ABNORMAL HIGH (ref 0.3–1.2)
Total Protein: 5.2 g/dL — ABNORMAL LOW (ref 6.5–8.1)

## 2023-01-28 LAB — CBC WITH DIFFERENTIAL/PLATELET
Abs Immature Granulocytes: 0.06 10*3/uL (ref 0.00–0.07)
Basophils Absolute: 0 10*3/uL (ref 0.0–0.1)
Basophils Relative: 1 %
Eosinophils Absolute: 0 10*3/uL (ref 0.0–0.5)
Eosinophils Relative: 0 %
HCT: 39.4 % (ref 39.0–52.0)
Hemoglobin: 13.6 g/dL (ref 13.0–17.0)
Immature Granulocytes: 1 %
Lymphocytes Relative: 11 %
Lymphs Abs: 0.8 10*3/uL (ref 0.7–4.0)
MCH: 35.3 pg — ABNORMAL HIGH (ref 26.0–34.0)
MCHC: 34.5 g/dL (ref 30.0–36.0)
MCV: 102.3 fL — ABNORMAL HIGH (ref 80.0–100.0)
Monocytes Absolute: 0.9 10*3/uL (ref 0.1–1.0)
Monocytes Relative: 12 %
Neutro Abs: 5.5 10*3/uL (ref 1.7–7.7)
Neutrophils Relative %: 75 %
Platelets: 135 10*3/uL — ABNORMAL LOW (ref 150–400)
RBC: 3.85 MIL/uL — ABNORMAL LOW (ref 4.22–5.81)
RDW: 15.9 % — ABNORMAL HIGH (ref 11.5–15.5)
WBC: 7.4 10*3/uL (ref 4.0–10.5)
nRBC: 0.9 % — ABNORMAL HIGH (ref 0.0–0.2)

## 2023-01-28 LAB — RAPID URINE DRUG SCREEN, HOSP PERFORMED
Amphetamines: NOT DETECTED
Barbiturates: NOT DETECTED
Benzodiazepines: NOT DETECTED
Cocaine: NOT DETECTED
Opiates: POSITIVE — AB
Tetrahydrocannabinol: NOT DETECTED

## 2023-01-28 LAB — IRON AND TIBC
Iron: 53 ug/dL (ref 45–182)
Saturation Ratios: 18 % (ref 17.9–39.5)
TIBC: 302 ug/dL (ref 250–450)
UIBC: 249 ug/dL

## 2023-01-28 LAB — CBC
HCT: 37.1 % — ABNORMAL LOW (ref 39.0–52.0)
Hemoglobin: 13 g/dL (ref 13.0–17.0)
MCH: 35.1 pg — ABNORMAL HIGH (ref 26.0–34.0)
MCHC: 35 g/dL (ref 30.0–36.0)
MCV: 100.3 fL — ABNORMAL HIGH (ref 80.0–100.0)
Platelets: 113 10*3/uL — ABNORMAL LOW (ref 150–400)
RBC: 3.7 MIL/uL — ABNORMAL LOW (ref 4.22–5.81)
RDW: 15.9 % — ABNORMAL HIGH (ref 11.5–15.5)
WBC: 6.6 10*3/uL (ref 4.0–10.5)
nRBC: 0.6 % — ABNORMAL HIGH (ref 0.0–0.2)

## 2023-01-28 LAB — GLUCOSE, CAPILLARY
Glucose-Capillary: 84 mg/dL (ref 70–99)
Glucose-Capillary: 86 mg/dL (ref 70–99)

## 2023-01-28 LAB — MULTIPLE MYELOMA PANEL, SERUM
Albumin/Glob SerPl: 1.2 (ref 0.7–1.7)
B-Globulin SerPl Elph-Mcnc: 0.9 g/dL (ref 0.7–1.3)
Globulin, Total: 2.5 g/dL (ref 2.2–3.9)
IgG (Immunoglobin G), Serum: 704 mg/dL (ref 603–1613)

## 2023-01-28 LAB — ACETAMINOPHEN LEVEL: Acetaminophen (Tylenol), Serum: 10 ug/mL (ref 10–30)

## 2023-01-28 LAB — ETHANOL: Alcohol, Ethyl (B): <10 mg/dL (ref <10)

## 2023-01-28 LAB — VITAMIN B12: Vitamin B-12: 614 pg/mL (ref 180–914)

## 2023-01-28 LAB — VITAMIN D 25 HYDROXY (VIT D DEFICIENCY, FRACTURES): Vit D, 25-Hydroxy: 38.03 ng/mL (ref 30–100)

## 2023-01-28 LAB — CREATININE, SERUM
Creatinine, Ser: 0.58 mg/dL — ABNORMAL LOW (ref 0.61–1.24)
GFR, Estimated: >60 mL/min (ref >60)

## 2023-01-28 LAB — ANA+ENA+DNA/DS+SCL 70+SJOSSA/B
ANA Titer 1: NEGATIVE
ENA RNP Ab: <0.2 AI (ref 0.0–0.9)
ENA SSA (RO) Ab: <0.2 AI (ref 0.0–0.9)

## 2023-01-28 LAB — CBG MONITORING, ED: Glucose-Capillary: 107 mg/dL — ABNORMAL HIGH (ref 70–99)

## 2023-01-28 LAB — RETICULOCYTES: RBC.: 3.88 MIL/uL — ABNORMAL LOW (ref 4.22–5.81)

## 2023-01-28 LAB — MRSA NEXT GEN BY PCR, NASAL: MRSA by PCR Next Gen: NOT DETECTED

## 2023-01-28 LAB — FOLATE: Folate: 3.6 ng/mL — ABNORMAL LOW (ref >5.9)

## 2023-01-28 LAB — CK
Total CK: 140 U/L (ref 49–439)
Total CK: 192 U/L (ref 49–397)

## 2023-01-28 LAB — MAGNESIUM: Magnesium: 2.1 mg/dL (ref 1.7–2.4)

## 2023-01-28 LAB — FERRITIN: Ferritin: 1234 ng/mL — ABNORMAL HIGH (ref 24–336)

## 2023-01-28 LAB — RHEUMATOID FACTOR: Rheumatoid fact SerPl-aCnc: 14.3 IU/mL — ABNORMAL HIGH (ref <14.0)

## 2023-01-28 MED ORDER — ENOXAPARIN SODIUM 40 MG/0.4ML IJ SOSY
40.0000 mg | PREFILLED_SYRINGE | INTRAMUSCULAR | Status: DC
  Administered 2023-01-28 – 2023-02-02 (×6): 40 mg via SUBCUTANEOUS
  Filled 2023-01-28 (×6): qty 0.4

## 2023-01-28 MED ORDER — CHLORDIAZEPOXIDE HCL 25 MG PO CAPS
25.0000 mg | ORAL_CAPSULE | Freq: Three times a day (TID) | ORAL | Status: DC
  Administered 2023-01-28 – 2023-02-03 (×19): 25 mg via ORAL
  Filled 2023-01-28 (×20): qty 1

## 2023-01-28 MED ORDER — FENTANYL CITRATE PF 50 MCG/ML IJ SOSY
25.0000 ug | PREFILLED_SYRINGE | INTRAMUSCULAR | Status: DC | PRN
  Administered 2023-01-28 – 2023-01-31 (×9): 25 ug via INTRAVENOUS
  Filled 2023-01-28 (×9): qty 1

## 2023-01-28 MED ORDER — ONDANSETRON HCL 4 MG PO TABS: 4.0000 mg | ORAL_TABLET | Freq: Four times a day (QID) | ORAL | Status: DC | PRN

## 2023-01-28 MED ORDER — CHLORHEXIDINE GLUCONATE CLOTH 2 % EX PADS
6.0000 | MEDICATED_PAD | Freq: Every day | CUTANEOUS | Status: DC
  Administered 2023-01-28 – 2023-02-01 (×5): 6 via TOPICAL

## 2023-01-28 MED ORDER — THIAMINE HCL 100 MG/ML IJ SOLN
100.0000 mg | Freq: Every day | INTRAMUSCULAR | Status: DC
  Administered 2023-01-30: 100 mg via INTRAVENOUS
  Filled 2023-01-28: qty 2

## 2023-01-28 MED ORDER — ADULT MULTIVITAMIN W/MINERALS CH
1.0000 | ORAL_TABLET | Freq: Every day | ORAL | Status: DC
  Administered 2023-01-28 – 2023-02-03 (×7): 1 via ORAL
  Filled 2023-01-28 (×7): qty 1

## 2023-01-28 MED ORDER — FOLIC ACID 1 MG PO TABS
1.0000 mg | ORAL_TABLET | Freq: Every day | ORAL | Status: DC
  Administered 2023-01-28 – 2023-01-29 (×2): 1 mg via ORAL
  Filled 2023-01-28 (×2): qty 1

## 2023-01-28 MED ORDER — SODIUM CHLORIDE 0.9 % IV BOLUS
1000.0000 mL | Freq: Once | INTRAVENOUS | Status: AC
  Administered 2023-01-28: 1000 mL via INTRAVENOUS

## 2023-01-28 MED ORDER — BIOTENE DRY MOUTH MT LIQD
15.0000 mL | Freq: Three times a day (TID) | OROMUCOSAL | Status: AC
  Administered 2023-01-28 – 2023-01-31 (×11): 15 mL via OROMUCOSAL

## 2023-01-28 MED ORDER — PANTOPRAZOLE SODIUM 40 MG PO TBEC
40.0000 mg | DELAYED_RELEASE_TABLET | Freq: Every day | ORAL | Status: DC
  Administered 2023-01-28 – 2023-01-29 (×2): 40 mg via ORAL
  Filled 2023-01-28 (×2): qty 1

## 2023-01-28 MED ORDER — LORAZEPAM 1 MG PO TABS
1.0000 mg | ORAL_TABLET | ORAL | Status: AC | PRN
  Administered 2023-01-29: 1 mg via ORAL
  Administered 2023-01-29: 3 mg via ORAL
  Filled 2023-01-28: qty 3
  Filled 2023-01-28: qty 1

## 2023-01-28 MED ORDER — HYDRALAZINE HCL 20 MG/ML IJ SOLN
5.0000 mg | INTRAMUSCULAR | Status: DC | PRN
  Administered 2023-01-28: 5 mg via INTRAVENOUS
  Filled 2023-01-28: qty 1

## 2023-01-28 MED ORDER — DIAZEPAM 5 MG PO TABS
5.0000 mg | ORAL_TABLET | Freq: Once | ORAL | Status: AC
  Administered 2023-01-28: 5 mg via ORAL
  Filled 2023-01-28: qty 1

## 2023-01-28 MED ORDER — NICOTINE 21 MG/24HR TD PT24
21.0000 mg | MEDICATED_PATCH | Freq: Every day | TRANSDERMAL | Status: DC
  Administered 2023-01-28 – 2023-02-03 (×7): 21 mg via TRANSDERMAL
  Filled 2023-01-28 (×7): qty 1

## 2023-01-28 MED ORDER — LORAZEPAM 1 MG PO TABS: 2.0000 mg | ORAL_TABLET | ORAL | Status: DC

## 2023-01-28 MED ORDER — ONDANSETRON HCL 4 MG/2ML IJ SOLN: 4.0000 mg | Freq: Four times a day (QID) | INTRAMUSCULAR | Status: DC | PRN

## 2023-01-28 MED ORDER — OXYCODONE-ACETAMINOPHEN 5-325 MG PO TABS
1.0000 | ORAL_TABLET | Freq: Four times a day (QID) | ORAL | Status: DC | PRN
  Administered 2023-01-28 – 2023-02-02 (×12): 1 via ORAL
  Filled 2023-01-28 (×12): qty 1

## 2023-01-28 MED ORDER — HYDROCODONE-ACETAMINOPHEN 7.5-325 MG PO TABS
1.0000 | ORAL_TABLET | ORAL | Status: DC | PRN
  Administered 2023-01-28: 1 via ORAL
  Filled 2023-01-28: qty 1

## 2023-01-28 MED ORDER — POLYETHYLENE GLYCOL 3350 17 G PO PACK: 17.0000 g | PACK | Freq: Every day | ORAL | Status: DC | PRN

## 2023-01-28 MED ORDER — THIAMINE MONONITRATE 100 MG PO TABS
100.0000 mg | ORAL_TABLET | Freq: Every day | ORAL | Status: DC
  Administered 2023-01-28 – 2023-02-03 (×6): 100 mg via ORAL
  Filled 2023-01-28 (×7): qty 1

## 2023-01-28 MED ORDER — LACTATED RINGERS IV SOLN: INTRAVENOUS | Status: DC

## 2023-01-28 MED ORDER — LORAZEPAM 2 MG/ML IJ SOLN
1.0000 mg | INTRAMUSCULAR | Status: AC | PRN
  Administered 2023-01-28: 2 mg via INTRAVENOUS
  Administered 2023-01-29: 4 mg via INTRAVENOUS
  Administered 2023-01-29: 2 mg via INTRAVENOUS
  Administered 2023-01-29: 4 mg via INTRAVENOUS
  Administered 2023-01-29: 1 mg via INTRAVENOUS
  Administered 2023-01-29 (×2): 4 mg via INTRAVENOUS
  Administered 2023-01-30 – 2023-01-31 (×5): 2 mg via INTRAVENOUS
  Filled 2023-01-28 (×2): qty 1
  Filled 2023-01-28: qty 2
  Filled 2023-01-28 (×4): qty 1
  Filled 2023-01-28: qty 2
  Filled 2023-01-28: qty 1
  Filled 2023-01-28 (×2): qty 2
  Filled 2023-01-28: qty 1

## 2023-01-28 NOTE — ED Notes (Signed)
Seizure pads on bed. Suction set up at bedside.  

## 2023-01-28 NOTE — ED Notes (Signed)
Lights dimmed per pt request

## 2023-01-28 NOTE — ED Triage Notes (Signed)
Pt BIB [facility] from home C/O witnessed seizure, described as "twitching all over." Hx ETOH abuse, last drink 2 days ago.

## 2023-01-28 NOTE — ED Notes (Signed)
Portable Xray at bedside.

## 2023-01-28 NOTE — Hospital Course (Addendum)
47 year old male long time heavy alcohol abuse (at least one fifth of liquor daily), chronic painful peripheral neuropathy, gout, tobacco abuser (2 ppd), hyperlipidemia, hypertriglyceridemia, FH CAD has been having increasing symptoms of painful neuropathy that has progressed over the past 2 months.  He has been evaluated by primary care and neurology.  He recently saw neurology and was started on lamictal rapid taper up. Pt reports he stopped taking it.  He finally decided that alcohol was the cause of his painful feet and legs and he stopped alcohol completely 2 days PTA.   His mother reported that he has not been eating or drinking fluids in the past couple of days.  She reported that he was in alcohol rehab about 10 years ago but has been drinking consistently heavily every day for past 10 years.  She came home to witness patient sitting in a recliner having a tonic clonic seizure.  He bit his tongue and lost control of his bowel and bladder.  [facility] was called and brought him into the ED. Seizure had stopped by the time he arrived, but pt was post-ictal and lethargic.  His BS was normal.    He was evaluated in ED and found to have normal head CT and CT cervical spine.  He had evidence of tongue lacerations and was noted to have hyponatremia and mild LFT elevation.  He is being admitted to stepdown ICU for acute alcohol withdrawal.   He remained agitated on CIWA ativan.  He was started on precedex on 01/29/23 He was subsequently weaned off on 02/01/23 and remained clinically stable.  He was physically deconditioned.  PT was ordered and he made functional gains daily.  HHPT will be set up

## 2023-01-28 NOTE — ED Notes (Signed)
Lab at bedside

## 2023-01-28 NOTE — ED Provider Notes (Signed)
[place] EMERGENCY DEPARTMENT AT [facility] Provider Note   CSN: [id number] Arrival date & time: 01/28/23  6213     History  Chief Complaint  Patient presents with   Seizures    Connor Copeland is a 47 y.o. male.   Seizures  This patient is a 47 year old male, he is currently treated with some Lasix for peripheral edema, he has recently been seen by neurology for what appears to be a peripheral neuropathy.  There was significant lab work which was ordered as well as EMG, as well as MRI of the lumbar and cervical spines which have not yet been performed, he is a chronic alcoholic according to his own report.  He stopped drinking within the last 24 hours and this morning was found to have tonic-clonic seizure activity with tongue biting, this stopped prior to EMS arrival, they found him to have a normal blood sugar, placed a peripheral IV but no medications were given.  He was transported to the hospital without recurrent seizures.  He is a little bit confused but is able to tell me that he tried to stop drinking cold [place].  The patient has no history of seizures, he has no history of neuroimaging.    Home Medications Prior to Admission medications   Medication Sig Start Date End Date Taking? Authorizing Provider  furosemide (LASIX) 20 MG tablet Take 2 tablets (40 mg total) by mouth daily for 3 days. 12/12/22 12/15/22  Gareth Eagle, PA-C  HYDROcodone-acetaminophen [facility]) 7.5-325 MG tablet Take by mouth. 12/27/22   [provider]  HYDROcodone-acetaminophen (NORCO/VICODIN) 5-325 MG tablet Take 1 tablet by mouth every 6 (six) hours as needed for severe pain. Patient not taking: Reported on 01/21/2023 01/10/23   Renne Crigler, PA-C  lamoTRIgine (LAMICTAL) 100 MG tablet Take one 1/2 pill po every day x 5 days, then 1/2 pill po bid x 5 days then 1 pill po bid 01/21/23   Sater, Pearletha Furl, MD  LORazepam (ATIVAN) 0.5 MG tablet Take 0.5 mg by mouth daily. Patient not  taking: Reported on 01/21/2023 12/27/22   [provider]  methylPREDNISolone (MEDROL) 4 MG tablet Taper from 6 pills po for one day to 1 pill po the last day over 6 days 01/21/23   Sater, Pearletha Furl, MD  Multiple Vitamin (MULTIVITAMIN WITH MINERALS) TABS tablet Take 1 tablet by mouth daily.    [provider]  potassium chloride SA (KLOR-CON M) 20 MEQ tablet Take 20 mEq by mouth daily. 12/14/22   [provider]  terbinafine (LAMISIL) 250 MG tablet Take 250 mg by mouth daily. Patient not taking: Reported on 12/12/2022 09/24/22   [provider]      Allergies    Gabapentin    Review of Systems   Review of Systems  Neurological:  Positive for seizures.  All other systems reviewed and are negative.   Physical Exam Updated Vital Signs BP (!) 142/104 (BP Location: Right Arm)   Pulse (!) 112   Temp 98.7 F (37.1 C) (Oral)   Resp 20   Ht 1.88 m (6\' 2" )   Wt 103.4 kg   SpO2 98%   BMI 29.27 kg/m  Physical Exam Vitals and nursing note reviewed.  Constitutional:      General: He is in acute distress.     Appearance: He is well-developed. He is ill-appearing.     Comments: Somnolent but arousable  HENT:     Head: Normocephalic and atraumatic.  Mouth/Throat:     Pharynx: No oropharyngeal exudate.     Comments: Tongue biting, no lacerations in need of repair, dentition is intact Eyes:     General: No scleral icterus.       Right eye: No discharge.        Left eye: No discharge.     Conjunctiva/sclera: Conjunctivae normal.     Pupils: Pupils are equal, round, and reactive to light.  Neck:     Thyroid: No thyromegaly.     Vascular: No JVD.  Cardiovascular:     Rate and Rhythm: Regular rhythm. Tachycardia present.     Heart sounds: Normal heart sounds. No murmur heard.    No friction rub. No gallop.  Pulmonary:     Effort: Pulmonary effort is normal. No respiratory distress.     Breath sounds: Normal breath sounds. No wheezing or rales.   Abdominal:     General: Bowel sounds are normal. There is no distension.     Palpations: Abdomen is soft. There is no mass.     Tenderness: There is no abdominal tenderness.  Musculoskeletal:        General: No tenderness. Normal range of motion.     Cervical back: Normal range of motion and neck supple.     Right lower leg: No edema.     Left lower leg: No edema.  Lymphadenopathy:     Cervical: No cervical adenopathy.  Skin:    General: Skin is warm and dry.     Findings: No erythema or rash.  Neurological:     Coordination: Coordination normal.     Comments: Able to slowly follow commands, no obvious deficits right or left, symmetrically mildly slow, cranial nerves III through XII appear normal.  Able to reposition himself in the bed slowly, no asterixis.  Psychiatric:        Behavior: Behavior normal.     ED Results / Procedures / Treatments   Labs (all labs ordered are listed, but only abnormal results are displayed) Labs Reviewed  CBC WITH DIFFERENTIAL/PLATELET  COMPREHENSIVE METABOLIC PANEL  CBG MONITORING, ED    EKG None  Radiology No results found.  Procedures Procedures    Medications Ordered in ED Medications  diazepam (VALIUM) tablet 5 mg (has no administration in time range)  sodium chloride 0.9 % bolus 1,000 mL (has no administration in time range)    ED Course/ Medical Decision Making/ A&P                             Medical Decision Making Amount and/or Complexity of Data Reviewed Labs: ordered. Radiology: ordered. ECG/medicine tests: ordered.  Risk Prescription drug management. Decision regarding hospitalization.    This patient presents to the ED for concern of seizures, this involves an extensive number of treatment options, and is a complaint that carries with it a high risk of complications and morbidity.  The differential diagnosis includes alcohol withdrawal, primary seizure disorder, drug-related, trauma, evidently the patient  has had recent falls which is left him with some difficulty with balance   Co morbidities that complicate the patient evaluation  Alcohol abuse   Additional history obtained:  Additional history obtained from medical record External records from outside source obtained and reviewed including neurology notes   Lab Tests:  I Ordered, and personally interpreted labs.  The pertinent results include: No findings of significant abnormalities except for mild hyponatremia but not to the level  where this would cause seizures   Imaging Studies ordered:  I ordered imaging studies including CT scan of the head and cervical spine and the chest x-ray I independently visualized and interpreted imaging which showed no acute findings I agree with the radiologist interpretation   Cardiac Monitoring: / EKG:  The patient was maintained on a cardiac monitor.  I personally viewed and interpreted the cardiac monitored which showed an underlying rhythm of: Initially tachycardic but improved with IV fluids   Consultations Obtained:  I requested consultation with the hospitalist,  and discussed lab and imaging findings as well as pertinent plan - they recommend: Admission   Problem List / ED Course / Critical interventions / Medication management  Patient with what appears to be alcohol withdrawal seizures I ordered medication including benzodiazepines for withdrawal Reevaluation of the patient after these medicines showed that the patient improved I have reviewed the patients home medicines and have made adjustments as needed   Social Determinants of Health:  Chronic alcohol use   Test / Admission - Considered:  Admitted         Final Clinical Impression(s) / ED Diagnoses Final diagnoses:  Alcohol withdrawal seizure without complication [facility])    Rx / DC Orders ED Discharge Orders     None         Eber Hong, MD 02/07/23 [phone]

## 2023-01-28 NOTE — H&P (Addendum)
History and Physical  [facility]  Connor Copeland PPI:[id number] DOB: [birth year] DOA: 01/28/2023  PCP: Benita Stabile, MD  Neurologist: Epimenio Foot Cardiologist: Mallipeddi Patient coming from: Home by [facility]  Level of care: Stepdown  I have personally briefly reviewed patient's old medical records in [facility]  Chief Complaint: Seizure  HPI: Connor Copeland is a 47 year old male long time heavy alcohol abuse (at least one fifth of liquor daily), chronic painful peripheral neuropathy, gout, tobacco abuser (2 ppd), hyperlipidemia, hypertriglyceridemia, FH CAD has been having increasing symptoms of painful neuropathy that has progressed over the past 2 months.  He has been evaluated by primary care and neurology.  He recently saw neurology and was started on lamictal rapid taper up. Pt reports he stopped taking it.  He finally decided that alcohol was the cause of his painful feet and legs and he stopped alcohol completely 2 days ago.    His mother reported that he has not been eating or drinking fluids in the past couple of days.  She reported that he was in alcohol rehab about 10 years ago but has been drinking consistently heavily every day for past 10 years.  She came home to witness patient sitting in a recliner having a tonic clonic seizure.  He bit his tongue and lost control of his bowel and bladder. [facility] was called and brought him into the ED. Seizure had stopped by the time he arrived.  His BS was normal.    He was evaluated in ED and found to have normal head CT and CT cervical spine.  He had evidence of tongue lacerations and was noted to have hyponatremia and mild LFT elevation.  He is being admitted to stepdown ICU for acute alcohol withdrawal.      Past Medical History:  Diagnosis Date   ETOH abuse    Gout     Past Surgical History:  Procedure Laterality Date   FOOT SURGERY       reports that he has been smoking cigarettes. He has been smoking an  average of 1 pack per day. He has never used smokeless tobacco. He reports current alcohol use of about 42.0 standard drinks of alcohol per week. He reports that he does not use drugs.  Allergies  Allergen Reactions   Gabapentin Swelling    Swelling off the legs    Family History  Problem Relation Age of Onset   COPD Father     Prior to Admission medications   Medication Sig Start Date End Date Taking? Authorizing Provider  HYDROcodone-acetaminophen (NORCO) 7.5-325 MG tablet Take 1 tablet by mouth every 4 (four) hours as needed for moderate pain or severe pain. 12/27/22  Yes [provider]  LORazepam (ATIVAN) 0.5 MG tablet Take 0.5 mg by mouth daily. 12/27/22  Yes [provider]  furosemide (LASIX) 20 MG tablet Take 2 tablets (40 mg total) by mouth daily for 3 days. Patient not taking: Reported on 01/28/2023 12/12/22 12/15/22  Gareth Eagle, PA-C    Physical Exam: Vitals:   01/28/23 1019 01/28/23 1045 01/28/23 1108 01/28/23 1144  BP: (!) 125/95 (!) 138/101 (!) 131/97 (!) 145/109  Pulse: (!) 103 (!) 106 (!) 106 (!) 109  Resp: 15 16 16    Temp:   98.5 F (36.9 C)   TempSrc:   Oral   SpO2: 92% 92% 95%   Weight:      Height:        Constitutional: appears  older than stated age, NAD, calm, comfortable Eyes: PERRL, lids and conjunctivae normal ENMT: Mucous membranes are pale and dry; multiple tongue lacerations seen. Posterior pharynx clear of any exudate or lesions. poor dentition.  Neck: normal, supple, no masses, no thyromegaly Respiratory: clear to auscultation bilaterally, no wheezing, no crackles. Normal respiratory effort. No accessory muscle use.  Cardiovascular: normal s1, s2 sounds, no murmurs / rubs / gallops. No extremity edema. 2+ pedal pulses. No carotid bruits.  Abdomen: no tenderness, no masses palpated. No hepatosplenomegaly. Bowel sounds positive.  Musculoskeletal: no clubbing / cyanosis. No joint deformity upper and lower extremities. Good ROM,  no contractures. Normal muscle tone.  Skin: no rashes, lesions, ulcers. No induration Neurologic: CN 2-12 grossly intact. Sensation intact, DTR normal. Strength 5/5 in all 4.  Psychiatric: Poor judgment and insight. Alert and oriented x 3. Flat affect.   Labs on Admission: I have personally reviewed following labs and imaging studies  CBC: Recent Labs  Lab 01/28/23 0940  WBC 7.4  NEUTROABS 5.5  HGB 13.6  HCT 39.4  MCV 102.3*  PLT 135*   Basic Metabolic Panel: Recent Labs  Lab 01/28/23 0940  NA 130*  K 3.5  CL 92*  CO2 22  GLUCOSE 97  BUN 7  CREATININE 0.80  CALCIUM 7.4*   GFR: Estimated Creatinine Clearance: 144.9 mL/min (by C-G formula based on SCr of 0.8 mg/dL). Liver Function Tests: Recent Labs  Lab 01/21/23 1614 01/28/23 0940  AST  --  99*  ALT  --  54*  ALKPHOS  --  84  BILITOT  --  1.9*  PROT 5.4* 5.2*  ALBUMIN  --  2.8*   No results for input(s): "LIPASE", "AMYLASE" in the last 168 hours. No results for input(s): "AMMONIA" in the last 168 hours. Coagulation Profile: No results for input(s): "INR", "PROTIME" in the last 168 hours. Cardiac Enzymes: Recent Labs  Lab 01/21/23 1614  CKTOTAL 140   BNP (last 3 results) No results for input(s): "PROBNP" in the last 8760 hours. HbA1C: No results for input(s): "HGBA1C" in the last 72 hours. CBG: Recent Labs  Lab 01/28/23 0928  GLUCAP 107*   Lipid Profile: No results for input(s): "CHOL", "HDL", "LDLCALC", "TRIG", "CHOLHDL", "LDLDIRECT" in the last 72 hours. Thyroid Function Tests: No results for input(s): "TSH", "T4TOTAL", "FREET4", "T3FREE", "THYROIDAB" in the last 72 hours. Anemia Panel: No results for input(s): "VITAMINB12", "FOLATE", "FERRITIN", "TIBC", "IRON", "RETICCTPCT" in the last 72 hours. Urine analysis:    Component Value Date/Time   COLORURINE YELLOW 01/10/2023 1019   APPEARANCEUR CLEAR 01/10/2023 1019   LABSPEC 1.013 01/10/2023 1019   PHURINE 5.5 01/10/2023 1019   GLUCOSEU  NEGATIVE 01/10/2023 1019   HGBUR NEGATIVE 01/10/2023 1019   BILIRUBINUR NEGATIVE 01/10/2023 1019   KETONESUR NEGATIVE 01/10/2023 1019   PROTEINUR NEGATIVE 01/10/2023 1019   NITRITE NEGATIVE 01/10/2023 1019   LEUKOCYTESUR NEGATIVE 01/10/2023 1019    Radiological Exams on Admission: CT Cervical Spine Wo Contrast  Result Date: 01/28/2023 CLINICAL DATA:  Polytrauma, blunt EXAM: CT CERVICAL SPINE WITHOUT CONTRAST TECHNIQUE: Multidetector CT imaging of the cervical spine was performed without intravenous contrast. Multiplanar CT image reconstructions were also generated. RADIATION DOSE REDUCTION: This exam was performed according to the departmental dose-optimization program which includes automated exposure control, adjustment of the mA and/or kV according to patient size and/or use of iterative reconstruction technique. COMPARISON:  None Available. FINDINGS: Alignment: Normal Skull base and vertebrae: No acute fracture. No primary bone lesion or focal pathologic process. Soft  tissues and spinal canal: No prevertebral fluid or swelling. No visible canal hematoma. Disc levels:  Disc space narrowing and early spurring at C6-7. Upper chest: No acute findings Other: None IMPRESSION: No acute bony abnormality. Electronically Signed   By: Charlett Nose M.D.   On: 01/28/2023 10:09   CT Head Wo Contrast  Result Date: 01/28/2023 CLINICAL DATA:  Seizure, new-onset, history of trauma EXAM: CT HEAD WITHOUT CONTRAST TECHNIQUE: Contiguous axial images were obtained from the base of the skull through the vertex without intravenous contrast. RADIATION DOSE REDUCTION: This exam was performed according to the departmental dose-optimization program which includes automated exposure control, adjustment of the mA and/or kV according to patient size and/or use of iterative reconstruction technique. COMPARISON:  None Available. FINDINGS: Brain: No acute intracranial abnormality. Specifically, no hemorrhage, hydrocephalus, mass  lesion, acute infarction, or significant intracranial injury. Vascular: No hyperdense vessel or unexpected calcification. Skull: No acute calvarial abnormality. Sinuses/Orbits: No acute findings Other: None IMPRESSION: Normal study. Electronically Signed   By: Charlett Nose M.D.   On: 01/28/2023 10:03   DG Chest Port 1 View  Result Date: 01/28/2023 CLINICAL DATA:  47 year old male status post seizure. EXAM: PORTABLE CHEST 1 VIEW COMPARISON:  Chest radiographs 01/10/2023 and earlier. FINDINGS: Portable AP upright view at 0936 hours. Improved lung volumes. Normal cardiac size and mediastinal contours. Visualized tracheal air column is within normal limits. Allowing for portable technique the lungs are clear. No pneumothorax or pleural effusion. No acute osseous abnormality identified. Paucity of visible bowel gas. IMPRESSION: Negative portable chest. Electronically Signed   By: Odessa Fleming M.D.   On: 01/28/2023 09:47     Assessment/Plan Principal Problem:   Alcohol withdrawal seizure (HCC) Active Problems:   Alcohol abuse   HLD (hyperlipidemia)   Hypertriglyceridemia   Family history of coronary artery disease   Peripheral neuropathy   Leg swelling   Hypoalbuminemia   Tobacco use disorder   Hyponatremia   Elevated MCV   Macrocytic anemia   Elevated transaminase level   Hyperbilirubinemia   Gout   Acute Alcohol Withdrawal / Alcohol withdrawal seizure  - Pt stopped alcohol completely 2 days ago after drinking heavily everyday for at least 10 years - he had a grand mal tonic clonic seizure witnessed by his mother  - He is HIGH RISK for further withdrawal seizures without further medical intervention - he is threatening to leave already but mother was able to convince him to stay at least 1 night - he will be admitted to stepdown ICU  - starting librium 25 mg TID with CIWA protocol for acute alcohol withdrawal  - may need to escalate therapy if acute delirium tremens develops - IV fluid  hydration started  - check urine toxicology screen  - seizure precautions ordered  - when medically stabilized TOC consultation for alcohol treatment program options  Tongue Laceration  - ordered peridex antimicrobial mouth rinse three times daily x 4 days  Tobacco abuser - smokes at least 2 ppd per mother - nicotine patch 21 mg daily ordered, could add an additional patch 14 or 21 mg if needed - counseled on tobacco cessation but he does not seem ready at this time  Severe painful peripheral polyneuropathy - secondary to chronic daily alcohol abuse - he was recently started on lamictal by neurology but stopped taking it - lamictal can cause seizure and abrupt DC of lamictal can cause seizure - we did not restart the medication  - lamictal level pending  -  resumed home hydrocodone/APAP started by PCP last month - mother reports that he had been taking them frequently at home contrary to the prescription directions - careful with escalation of opioids here   Hyponatremia  - secondary to poor oral intake over last 2-3 days - hydrating with IV fluid - follow closely  - Na down to 130 don't think this was low enough to precipitate seizure  Elevated transaminases - he had normal LFTs 01/10/23 - rise possibly acute phase reaction - mother also reported that patient was taking hydrocodone/APAP tabs much more frequently than prescribed - added an acetaminophen level  - recheck LFTs in AM, if remain elevated or not trending down, consider abd [place] - IV fluid hydration as ordered   Macrocytic anemia  - follow up anemia panel pending  - checking B12, folic acid and iron studies  Critical Care Procedure Note Authorized and Performed by: Maryln Manuel MD  Total Critical Care time:   75 mins Due to a high probability of clinically significant, life threatening deterioration, the patient required my highest level of preparedness to intervene emergently and I personally spent this critical care  time directly and personally managing the patient.  This critical care time included obtaining a history; examining the patient, pulse oximetry; ordering and review of studies; arranging urgent treatment with development of a management plan; evaluation of patient's response of treatment; frequent reassessment; and discussions with other providers.  This critical care time was performed to assess and manage the high probability of imminent and life threatening deterioration that could result in multi-organ failure.  It was exclusive of separately billable procedures and treating other patients and teaching time.   DVT prophylaxis: enoxaparin   Code Status: Full   Family Communication: mother at bedside   Disposition Plan: TBD   Consults called:   Admission status: INP   Level of care: Stepdown Standley Dakins MD [facility] How to contact the [facility] Attending or Consulting provider 7A - 7P or covering provider during after hours 7P -7A, for this patient?  Check the care team in [facility] and look for a) attending/consulting [facility] provider listed and b) the [facility] team listed Log into [email or web address] and use [place]'s universal password to access. If you do not have the password, please contact the hospital operator. Locate the [facility] provider you are looking for under [facility] and page to a number that you can be directly reached. If you still have difficulty reaching the provider, please page the [facility] (Director on Call) for the Hospitalists listed on amion for assistance.   If 7PM-7AM, please contact night-coverage [email or web address] Password [facility]  01/28/2023, 12:32 PM

## 2023-01-28 NOTE — ED Notes (Signed)
Pt transported to CT via stretcher.  

## 2023-01-29 DIAGNOSIS — G609 Hereditary and idiopathic neuropathy, unspecified: Secondary | ICD-10-CM | POA: Diagnosis not present

## 2023-01-29 DIAGNOSIS — R569 Unspecified convulsions: Secondary | ICD-10-CM | POA: Diagnosis not present

## 2023-01-29 DIAGNOSIS — F10931 Alcohol use, unspecified with withdrawal delirium: Secondary | ICD-10-CM | POA: Insufficient documentation

## 2023-01-29 LAB — PHOSPHORUS: Phosphorus: 2.7 mg/dL (ref 2.5–4.6)

## 2023-01-29 LAB — CBC WITH DIFFERENTIAL/PLATELET
Abs Immature Granulocytes: 0.05 10*3/uL (ref 0.00–0.07)
Basophils Absolute: 0 10*3/uL (ref 0.0–0.1)
Basophils Relative: 1 %
Eosinophils Absolute: 0.1 10*3/uL (ref 0.0–0.5)
Eosinophils Relative: 1 %
HCT: 37.8 % — ABNORMAL LOW (ref 39.0–52.0)
Hemoglobin: 13.2 g/dL (ref 13.0–17.0)
Immature Granulocytes: 1 %
Lymphocytes Relative: 17 %
Lymphs Abs: 1.1 10*3/uL (ref 0.7–4.0)
MCH: 35.3 pg — ABNORMAL HIGH (ref 26.0–34.0)
MCHC: 34.9 g/dL (ref 30.0–36.0)
MCV: 101.1 fL — ABNORMAL HIGH (ref 80.0–100.0)
Monocytes Absolute: 0.8 10*3/uL (ref 0.1–1.0)
Monocytes Relative: 13 %
Neutro Abs: 4.1 10*3/uL (ref 1.7–7.7)
Neutrophils Relative %: 67 %
Platelets: 106 10*3/uL — ABNORMAL LOW (ref 150–400)
RBC: 3.74 MIL/uL — ABNORMAL LOW (ref 4.22–5.81)
RDW: 15.9 % — ABNORMAL HIGH (ref 11.5–15.5)
WBC: 6.1 10*3/uL (ref 4.0–10.5)
nRBC: 0.5 % — ABNORMAL HIGH (ref 0.0–0.2)

## 2023-01-29 LAB — TSH: TSH: 2.199 u[IU]/mL (ref 0.350–4.500)

## 2023-01-29 LAB — GLUCOSE, CAPILLARY
Glucose-Capillary: 80 mg/dL (ref 70–99)
Glucose-Capillary: 69 mg/dL — ABNORMAL LOW (ref 70–99)
Glucose-Capillary: 79 mg/dL (ref 70–99)

## 2023-01-29 LAB — COMPREHENSIVE METABOLIC PANEL
ALT: 44 U/L (ref 0–44)
AST: 74 U/L — ABNORMAL HIGH (ref 15–41)
Albumin: 2.5 g/dL — ABNORMAL LOW (ref 3.5–5.0)
Alkaline Phosphatase: 74 U/L (ref 38–126)
Anion gap: 11 (ref 5–15)
BUN: 5 mg/dL — ABNORMAL LOW (ref 6–20)
CO2: 24 mmol/L (ref 22–32)
Calcium: 7.2 mg/dL — ABNORMAL LOW (ref 8.9–10.3)
Chloride: 95 mmol/L — ABNORMAL LOW (ref 98–111)
Creatinine, Ser: 0.56 mg/dL — ABNORMAL LOW (ref 0.61–1.24)
GFR, Estimated: >60 mL/min (ref >60)
Glucose, Bld: 71 mg/dL (ref 70–99)
Potassium: 2.8 mmol/L — ABNORMAL LOW (ref 3.5–5.1)
Sodium: 130 mmol/L — ABNORMAL LOW (ref 135–145)
Total Bilirubin: 1.6 mg/dL — ABNORMAL HIGH (ref 0.3–1.2)
Total Protein: 4.7 g/dL — ABNORMAL LOW (ref 6.5–8.1)

## 2023-01-29 LAB — MAGNESIUM: Magnesium: 1.9 mg/dL (ref 1.7–2.4)

## 2023-01-29 LAB — LAMOTRIGINE LEVEL: Lamotrigine Lvl: 7 ug/mL (ref 2.0–20.0)

## 2023-01-29 MED ORDER — ORAL CARE MOUTH RINSE
15.0000 mL | OROMUCOSAL | Status: DC
  Administered 2023-01-29 – 2023-02-03 (×16): 15 mL via OROMUCOSAL

## 2023-01-29 MED ORDER — PANTOPRAZOLE SODIUM 40 MG IV SOLR
40.0000 mg | INTRAVENOUS | Status: DC
  Administered 2023-01-30 – 2023-02-02 (×4): 40 mg via INTRAVENOUS
  Filled 2023-01-29 (×4): qty 10

## 2023-01-29 MED ORDER — POTASSIUM CHLORIDE IN NACL 40-0.9 MEQ/L-% IV SOLN: INTRAVENOUS | Status: DC

## 2023-01-29 MED ORDER — ORAL CARE MOUTH RINSE: 15.0000 mL | OROMUCOSAL | Status: DC | PRN

## 2023-01-29 MED ORDER — POLYVINYL ALCOHOL 1.4 % OP SOLN
1.0000 [drp] | OPHTHALMIC | Status: DC | PRN
  Administered 2023-01-29: 1 [drp] via OPHTHALMIC
  Filled 2023-01-29: qty 15

## 2023-01-29 MED ORDER — METHOCARBAMOL 1000 MG/10ML IJ SOLN
500.0000 mg | Freq: Once | INTRAVENOUS | Status: AC
  Administered 2023-01-29: 500 mg via INTRAVENOUS
  Filled 2023-01-29: qty 5

## 2023-01-29 MED ORDER — DEXMEDETOMIDINE HCL IN NACL 400 MCG/100ML IV SOLN
0.0000 ug/kg/h | INTRAVENOUS | Status: DC
  Administered 2023-01-29: 0.3 ug/kg/h via INTRAVENOUS
  Administered 2023-01-29: 0.4 ug/kg/h via INTRAVENOUS
  Administered 2023-01-30 (×4): 0.8 ug/kg/h via INTRAVENOUS
  Administered 2023-01-31 (×2): 0.4 ug/kg/h via INTRAVENOUS
  Administered 2023-01-31 (×3): 0.9 ug/kg/h via INTRAVENOUS
  Filled 2023-01-29 (×12): qty 100

## 2023-01-29 MED ORDER — METHOCARBAMOL 1000 MG/10ML IJ SOLN
INTRAMUSCULAR | Status: AC
  Filled 2023-01-29: qty 10

## 2023-01-29 MED ORDER — DEXMEDETOMIDINE BOLUS VIA INFUSION
1.0000 ug/kg | Freq: Once | INTRAVENOUS | Status: AC
  Administered 2023-01-29: 102.1 ug via INTRAVENOUS
  Filled 2023-01-29: qty 103

## 2023-01-29 MED ORDER — POTASSIUM CHLORIDE 10 MEQ/100ML IV SOLN
10.0000 meq | INTRAVENOUS | Status: AC
  Administered 2023-01-29 (×4): 10 meq via INTRAVENOUS
  Filled 2023-01-29 (×4): qty 100

## 2023-01-29 MED ORDER — SODIUM CHLORIDE 0.9 % IV SOLN
1.0000 mg | Freq: Every day | INTRAVENOUS | Status: DC
  Filled 2023-01-29 (×2): qty 0.2

## 2023-01-29 NOTE — Progress Notes (Signed)
PROGRESS NOTE  Connor Copeland:[id number] DOB: [birth year] DOA: 01/28/2023 PCP: Benita Stabile, MD  Brief History:  47 year old male long time heavy alcohol abuse (at least one fifth of liquor daily), chronic painful peripheral neuropathy, gout, tobacco abuser (2 ppd), hyperlipidemia, hypertriglyceridemia, FH CAD has been having increasing symptoms of painful neuropathy that has progressed over the past 2 months.  He has been evaluated by primary care and neurology.  He recently saw neurology and was started on lamictal rapid taper up. Pt reports he stopped taking it.  He finally decided that alcohol was the cause of his painful feet and legs and he stopped alcohol completely 2 days PTA.   His mother reported that he has not been eating or drinking fluids in the past couple of days.  She reported that he was in alcohol rehab about 10 years ago but has been drinking consistently heavily every day for past 10 years.  She came home to witness patient sitting in a recliner having a tonic clonic seizure.  He bit his tongue and lost control of his bowel and bladder.  [facility] was called and brought him into the ED. Seizure had stopped by the time he arrived, but pt was post-ictal and lethargic.  His BS was normal.    He was evaluated in ED and found to have normal head CT and CT cervical spine.  He had evidence of tongue lacerations and was noted to have hyponatremia and mild LFT elevation.  He is being admitted to stepdown ICU for acute alcohol withdrawal.   He remained agitated on CIWA ativan.  He was started on precedex on 01/29/23    Assessment/Plan:  Acute Alcohol Withdrawal / Alcohol withdrawal seizure  - Pt stopped alcohol completely 2 days PTA after drinking heavily everyday for at least 10 years - tonic clonic seizure witnessed by his mother  - remains agitated and combative on CIWA Ativan - start precedex - continue IV Ativan - continue IVF - check urine toxicology  screen>>positive opiates - seizure precautions ordered  - when medically stabilized TOC consultation for alcohol treatment program options   Tongue Laceration  - ordered peridex antimicrobial mouth rinse three times daily x 4 days   Tobacco abuser - smokes at least 2 ppd per mother - nicotine patch 21 mg daily ordered, could add an additional patch 14 or 21 mg if needed - counseled on tobacco cessation but he does not seem ready at this time   painful peripheral polyneuropathy - secondary to chronic daily alcohol abuse - he was recently started on lamictal by neurology but stopped taking it - we did not restart the medication  - resumed home hydrocodone/APAP started by PCP last month - mother reports that he had been taking them frequently at home contrary to the prescription directions - careful with escalation of opioids here    Hyponatremia  - secondary to poor oral intake  - hydrating with IV fluid - follow closely  - Na down to 130 don't think this was low enough to precipitate seizure   Elevated transaminases - he had normal LFTs 01/10/23 - trending down - mother also reported that patient was taking hydrocodone/APAP tabs much more frequently than prescribed    Family Communication:   mother and sister at bedside  Consultants:  none  Code Status:  FULL   DVT Prophylaxis:[place] Lovenox   Procedures: As Listed in Progress Note Above  Antibiotics:  None    The patient is critically ill with multiple organ systems failure and requires high complexity decision making for assessment and support, frequent evaluation and titration of therapies, application of advanced monitoring technologies and extensive interpretation of multiple databases.  Critical care time - 35 mins.     Subjective: Patient is confused and agitated.  ROS not possible  Objective: Vitals:   01/29/23 0900 01/29/23 0933 01/29/23 0948 01/29/23 1000  BP: 99/63   114/74  Pulse: (!) 121 (!) 114 (!)  122 (!) 124  Resp:  17 (!) 23 20  Temp:      TempSrc:      SpO2:  99% 99% 97%  Weight:      Height:        Intake/Output Summary (Last 24 hours) at 01/29/2023 1031 Last data filed at 01/29/2023 1005 Gross per 24 hour  Intake 3727.93 ml  Output 700 ml  Net 3027.93 ml   Weight change:  Exam:  General:  Pt is confused and agitated.  No following commands HEENT: No icterus, No thrush, No neck mass, [place]/AT Cardiovascular: RRR, S1/S2, no rubs, no gallops Respiratory: CTA bilaterally, no wheezing, no crackles, no rhonchi Abdomen: Soft/+BS, non tender, non distended, no guarding Extremities: No edema, No lymphangitis, No petechiae, No rashes, no synovitis   Data Reviewed: I have personally reviewed following labs and imaging studies Basic Metabolic Panel: Recent Labs  Lab 01/28/23 0940 01/28/23 1221 01/29/23 0423  NA 130*  --  130*  K 3.5  --  2.8*  CL 92*  --  95*  CO2 22  --  24  GLUCOSE 97  --  71  BUN 7  --  5*  CREATININE 0.80 0.58* 0.56*  CALCIUM 7.4*  --  7.2*  MG  --  2.1 1.9  PHOS  --   --  2.7   Liver Function Tests: Recent Labs  Lab 01/28/23 0940 01/29/23 0423  AST 99* 74*  ALT 54* 44  ALKPHOS 84 74  BILITOT 1.9* 1.6*  PROT 5.2* 4.7*  ALBUMIN 2.8* 2.5*   No results for input(s): "LIPASE", "AMYLASE" in the last 168 hours. No results for input(s): "AMMONIA" in the last 168 hours. Coagulation Profile: No results for input(s): "INR", "PROTIME" in the last 168 hours. CBC: Recent Labs  Lab 01/28/23 0940 01/28/23 1221 01/29/23 0423  WBC 7.4 6.6 6.1  NEUTROABS 5.5  --  4.1  HGB 13.6 13.0 13.2  HCT 39.4 37.1* 37.8*  MCV 102.3* 100.3* 101.1*  PLT 135* 113* 106*   Cardiac Enzymes: Recent Labs  Lab 01/28/23 0940  CKTOTAL 192   BNP: Invalid input(s): "POCBNP" CBG: Recent Labs  Lab 01/28/23 0928 01/28/23 1616 01/28/23 2216  GLUCAP 107* 84 86   HbA1C: No results for input(s): "HGBA1C" in the last 72 hours. Urine analysis:    Component  Value Date/Time   COLORURINE YELLOW 01/10/2023 1019   APPEARANCEUR CLEAR 01/10/2023 1019   LABSPEC 1.013 01/10/2023 1019   PHURINE 5.5 01/10/2023 1019   GLUCOSEU NEGATIVE 01/10/2023 1019   HGBUR NEGATIVE 01/10/2023 1019   BILIRUBINUR NEGATIVE 01/10/2023 1019   KETONESUR NEGATIVE 01/10/2023 1019   PROTEINUR NEGATIVE 01/10/2023 1019   NITRITE NEGATIVE 01/10/2023 1019   LEUKOCYTESUR NEGATIVE 01/10/2023 1019   Sepsis Labs: @LABRCNTIP (procalcitonin:4,lacticidven:4) ) Recent Results (from the past 240 hour(s))  MRSA Next Gen by PCR, Nasal     Status: None   Collection Time: 01/28/23 12:45 PM   Specimen: Nasal Mucosa; Nasal Swab  Result Value Ref Range Status   MRSA by PCR Next Gen NOT DETECTED NOT DETECTED Final    Comment: (NOTE) The GeneXpert MRSA Assay (FDA approved for NASAL specimens only), is one component of a comprehensive MRSA colonization surveillance program. It is not intended to diagnose MRSA infection nor to guide or monitor treatment for MRSA infections. Test performance is not FDA approved in patients less than 74 years old. Performed at [facility], [address] [place], [place] [address]      Scheduled Meds:  antiseptic oral rinse  15 mL Mouth Rinse TID   chlordiazePOXIDE  25 mg Oral TID   Chlorhexidine Gluconate Cloth  6 each Topical Daily   dexmedetomidine  1 mcg/kg Intravenous Once   enoxaparin (LOVENOX) injection  40 mg Subcutaneous Q24H   folic acid  1 mg Oral Daily   multivitamin with minerals  1 tablet Oral Daily   nicotine  21 mg Transdermal Daily   mouth rinse  15 mL Mouth Rinse 4 times per day   pantoprazole  40 mg Oral Q0600   thiamine  100 mg Oral Daily   Or   thiamine  100 mg Intravenous Daily   Continuous Infusions:  dexmedetomidine (PRECEDEX) IV infusion     lactated ringers 125 mL/hr at 01/29/23 1005    Procedures/Studies: CT Cervical Spine Wo Contrast  Result Date: 01/28/2023 CLINICAL DATA:  Polytrauma, blunt EXAM: CT CERVICAL  SPINE WITHOUT CONTRAST TECHNIQUE: Multidetector CT imaging of the cervical spine was performed without intravenous contrast. Multiplanar CT image reconstructions were also generated. RADIATION DOSE REDUCTION: This exam was performed according to the departmental dose-optimization program which includes automated exposure control, adjustment of the mA and/or kV according to patient size and/or use of iterative reconstruction technique. COMPARISON:  None Available. FINDINGS: Alignment: Normal Skull base and vertebrae: No acute fracture. No primary bone lesion or focal pathologic process. Soft tissues and spinal canal: No prevertebral fluid or swelling. No visible canal hematoma. Disc levels:  Disc space narrowing and early spurring at C6-7. Upper chest: No acute findings Other: None IMPRESSION: No acute bony abnormality. Electronically Signed   By: Charlett Nose M.D.   On: 01/28/2023 10:09   CT Head Wo Contrast  Result Date: 01/28/2023 CLINICAL DATA:  Seizure, new-onset, history of trauma EXAM: CT HEAD WITHOUT CONTRAST TECHNIQUE: Contiguous axial images were obtained from the base of the skull through the vertex without intravenous contrast. RADIATION DOSE REDUCTION: This exam was performed according to the departmental dose-optimization program which includes automated exposure control, adjustment of the mA and/or kV according to patient size and/or use of iterative reconstruction technique. COMPARISON:  None Available. FINDINGS: Brain: No acute intracranial abnormality. Specifically, no hemorrhage, hydrocephalus, mass lesion, acute infarction, or significant intracranial injury. Vascular: No hyperdense vessel or unexpected calcification. Skull: No acute calvarial abnormality. Sinuses/Orbits: No acute findings Other: None IMPRESSION: Normal study. Electronically Signed   By: Charlett Nose M.D.   On: 01/28/2023 10:03   DG Chest Port 1 View  Result Date: 01/28/2023 CLINICAL DATA:  47 year old male status post  seizure. EXAM: PORTABLE CHEST 1 VIEW COMPARISON:  Chest radiographs 01/10/2023 and earlier. FINDINGS: Portable AP upright view at 0936 hours. Improved lung volumes. Normal cardiac size and mediastinal contours. Visualized tracheal air column is within normal limits. Allowing for portable technique the lungs are clear. No pneumothorax or pleural effusion. No acute osseous abnormality identified. Paucity of visible bowel gas. IMPRESSION: Negative portable chest. Electronically Signed   By: Althea Grimmer.D.  On: 01/28/2023 09:47   ECHOCARDIOGRAM COMPLETE  Result Date: 01/22/2023    ECHOCARDIOGRAM REPORT   Patient Name:   MAXIMIANO LOTT Date of Exam: 01/22/2023 Medical Rec #:  [id number]              Height:       74.0 in Accession #:    [id number]             Weight:       228.0 lb Date of Birth:  03-18-1976              BSA:          2.298 m Patient Age:    46 years               BP:           114/83 mmHg Patient Gender: M                      HR:           103 bpm. [name] Procedure: 2D Echo, Cardiac Doppler and Color Doppler Indications:    Leg swelling  History:        Patient has no prior history of Echocardiogram examinations.                 Risk Factors:Dyslipidemia. Leg swelling.  Sonographer:    Mikki Harbor Referring Phys: [id number] VISHNU P MALLIPEDDI  Sonographer Comments: Patient is obese. IMPRESSIONS  1. Left ventricular ejection fraction, by estimation, is 65 to 70%. The left ventricle has normal function. The left ventricle has no regional wall motion abnormalities. There is mild concentric left ventricular hypertrophy. Left ventricular diastolic parameters are consistent with Grade I diastolic dysfunction (impaired relaxation).  2. Right ventricular systolic function is normal. The right ventricular size is normal. Tricuspid regurgitation signal is inadequate for assessing PA pressure.  3. The mitral valve is grossly normal. Trivial mitral valve regurgitation.  4. The  aortic valve was not well visualized, likely tricuspid. Aortic valve regurgitation is not visualized. No aortic stenosis is present. Aortic valve mean gradient measures 3.0 mmHg.  5. Aortic dilatation noted. There is mild dilatation of the aortic root, measuring 41 mm.  6. The inferior vena cava is normal in size with greater than 50% respiratory variability, suggesting right atrial pressure of 3 mmHg. Comparison(s): No prior Echocardiogram. FINDINGS  Left Ventricle: Left ventricular ejection fraction, by estimation, is 65 to 70%. The left ventricle has normal function. The left ventricle has no regional wall motion abnormalities. The left ventricular internal cavity size was normal in size. There is  mild concentric left ventricular hypertrophy. Left ventricular diastolic parameters are consistent with Grade I diastolic dysfunction (impaired relaxation). Right Ventricle: The right ventricular size is normal. No increase in right ventricular wall thickness. Right ventricular systolic function is normal. Tricuspid regurgitation signal is inadequate for assessing PA pressure. Left Atrium: Left atrial size was normal in size. Right Atrium: Right atrial size was normal in size. Pericardium: There is no evidence of pericardial effusion. Presence of epicardial fat layer. Mitral Valve: The mitral valve is grossly normal. Trivial mitral valve regurgitation. MV peak gradient, 3.7 mmHg. The mean mitral valve gradient is 1.5 mmHg. Tricuspid Valve: The tricuspid valve is grossly normal. Tricuspid valve regurgitation is trivial. Aortic Valve: The aortic valve was not well visualized. There is mild aortic valve annular calcification. Aortic valve regurgitation is not visualized. No aortic stenosis is  present. Aortic valve mean gradient measures 3.0 mmHg. Aortic valve peak gradient measures 6.9 mmHg. Aortic valve area, by VTI measures 2.78 cm. Pulmonic Valve: The pulmonic valve was grossly normal. Pulmonic valve regurgitation is  trivial. Aorta: Aortic dilatation noted. There is mild dilatation of the aortic root, measuring 41 mm. Venous: The inferior vena cava is normal in size with greater than 50% respiratory variability, suggesting right atrial pressure of 3 mmHg. IAS/Shunts: No atrial level shunt detected by color flow Doppler.  LEFT VENTRICLE PLAX 2D LVIDd:         4.20 cm   Diastology LVIDs:         2.60 cm   LV e' medial:    7.62 cm/s LV PW:         1.10 cm   LV E/e' medial:  7.0 LV IVS:        1.30 cm   LV e' lateral:   10.10 cm/s LVOT diam:     2.10 cm   LV E/e' lateral: 5.3 LV SV:         62 LV SV Index:   27 LVOT Area:     3.46 cm  RIGHT VENTRICLE RV Basal diam:  3.00 cm RV Mid diam:    2.70 cm RV S prime:     13.80 cm/s TAPSE (M-mode): 2.6 cm LEFT ATRIUM             Index LA diam:        3.40 cm 1.48 cm/m LA Vol (A2C):   36.2 ml 15.75 ml/m LA Vol (A4C):   42.7 ml 18.58 ml/m LA Biplane Vol: 39.7 ml 17.28 ml/m  AORTIC VALVE AV Area (Vmax):    2.31 cm AV Area (Vmean):   2.59 cm AV Area (VTI):     2.78 cm AV Vmax:           131.00 cm/s AV Vmean:          75.300 cm/s AV VTI:            0.222 m AV Peak Grad:      6.9 mmHg AV Mean Grad:      3.0 mmHg LVOT Vmax:         87.20 cm/s LVOT Vmean:        56.300 cm/s LVOT VTI:          0.178 m LVOT/AV VTI ratio: 0.80  AORTA Ao Root diam: 4.10 cm Ao Arch diam: 3.7 cm MITRAL VALVE MV Area (PHT): 5.62 cm    SHUNTS MV Area VTI:   3.38 cm    Systemic VTI:  0.18 m MV Peak grad:  3.7 mmHg    Systemic Diam: 2.10 cm MV Mean grad:  1.5 mmHg MV Vmax:       0.96 m/s MV Vmean:      57.0 cm/s MV Decel Time: 135 msec MV E velocity: 53.50 cm/s MV A velocity: 85.00 cm/s MV E/A ratio:  0.63 Nona Dell MD Electronically signed by Nona Dell MD Signature Date/Time: 01/22/2023/2:06:29 PM    Final    DG Chest 2 View  Result Date: 01/10/2023 CLINICAL DATA:  Thrush, chest pain. EXAM: CHEST - 2 VIEW COMPARISON:  12/12/2022. FINDINGS: Low lung volumes accentuate the pulmonary vasculature and  cardiomediastinal silhouette. No consolidation pulmonary edema. No pleural effusion or pneumothorax. Visualized bones and upper abdomen are unremarkable. IMPRESSION: No evidence of acute cardiopulmonary disease. Electronically Signed   By: Orvan Falconer M.D.   On: 01/10/2023  11:16   US Abdomen Complete  Result Date: 12/31/2022 CLINICAL DATA:  Abnormal LFTs EXAM: ABDOMEN ULTRASOUND COMPLETE COMPARISON:  None Available. FINDINGS: Gallbladder: No gallstones or wall thickening visualized. No sonographic Murphy sign noted by sonographer. Common bile duct: Diameter: 4 mm Liver: Markedly increased echogenicity. No focal lesion. Portal vein is patent on color Doppler imaging with normal direction of blood flow towards the liver. IVC: No abnormality visualized. Pancreas: Visualized portion unremarkable. Spleen: Size and appearance within normal limits. Right Kidney: Length: 11.7 cm. Echogenicity within normal limits. No mass or hydronephrosis visualized. Left Kidney: Length: 12.1 cm. Echogenicity within normal limits. No mass or hydronephrosis visualized. Abdominal aorta: Proximal abdominal aorta measures 3.9 cm, mid abdominal aorta measures 2.7 cm and distal abdominal aorta measures 1.7 cm. Other findings: None. IMPRESSION: 1. Markedly increased hepatic parenchymal echogenicity suggestive of steatosis. 2. Abdominal aortic aneurysm measuring 3.9 cm. Recommend follow-up every 2 years. Reference: J Am Coll Radiol 2013;10:789-794. Electronically Signed   By: Annia Belt M.D.   On: 12/31/2022 09:03    Catarina Hartshorn, DO  [facility]  If 7PM-7AM, please contact night-coverage [email or web address] Password [facility] 01/29/2023, 10:31 AM   LOS: 1 day

## 2023-01-29 NOTE — Progress Notes (Signed)
Patient's mother and sister at bedside. Patient has had high CIWA scores and has received multiple doses of ativan to help with managing withdrawal symptoms. Patient's sister states that we should not be giving the patient ativan because it is making him mean and unmanageable, because that is how it did to their father. Education provided about alcohol withdrawal symptoms, importance of ativan, as well as prevention of alcohol withdrawal seizures. Patient's sister still not happy with ativan being given. Dr. Arbutus Leas requested to come to bedside. Precedex gtt ordered and subsequently started. Per Dr. Arbutus Leas it is still okay to give ativan, IF needed for breakthrough symptoms while on the precedex gtt.

## 2023-01-29 NOTE — Progress Notes (Signed)
Transition of Care [facility]) - Inpatient Brief Assessment   Patient Details  Name: ISABELLA IDA MRN: [id number] Date of Birth: [birth year]  [facility] CM/SW Contact:    Annice Needy, LCSW Phone Number: 01/29/2023, 2:51 PM   Clinical Narrative:    Transition of Care Asessment: Insurance and Status: Insurance coverage has been reviewed Patient has primary care physician: Yes Home environment has been reviewed: from home with parent Prior level of function:: indep Prior/Current Home Services: No current home services Social Determinants of Health Reivew: SDOH reviewed no interventions necessary Readmission risk has been reviewed: Yes Transition of care needs: no transition of care needs at this time

## 2023-01-30 DIAGNOSIS — F17200 Nicotine dependence, unspecified, uncomplicated: Secondary | ICD-10-CM | POA: Diagnosis not present

## 2023-01-30 DIAGNOSIS — R569 Unspecified convulsions: Secondary | ICD-10-CM | POA: Diagnosis not present

## 2023-01-30 DIAGNOSIS — F10931 Alcohol use, unspecified with withdrawal delirium: Secondary | ICD-10-CM | POA: Diagnosis not present

## 2023-01-30 DIAGNOSIS — R7401 Elevation of levels of liver transaminase levels: Secondary | ICD-10-CM | POA: Diagnosis not present

## 2023-01-30 LAB — CBC WITH DIFFERENTIAL/PLATELET
Abs Immature Granulocytes: 0.04 10*3/uL (ref 0.00–0.07)
Basophils Absolute: 0 10*3/uL (ref 0.0–0.1)
Basophils Relative: 0 %
Eosinophils Absolute: 0.1 10*3/uL (ref 0.0–0.5)
Eosinophils Relative: 2 %
HCT: 37.3 % — ABNORMAL LOW (ref 39.0–52.0)
Hemoglobin: 13.2 g/dL (ref 13.0–17.0)
Immature Granulocytes: 1 %
Lymphocytes Relative: 16 %
Lymphs Abs: 0.8 10*3/uL (ref 0.7–4.0)
MCH: 36.1 pg — ABNORMAL HIGH (ref 26.0–34.0)
MCHC: 35.4 g/dL (ref 30.0–36.0)
MCV: 101.9 fL — ABNORMAL HIGH (ref 80.0–100.0)
Monocytes Absolute: 0.6 10*3/uL (ref 0.1–1.0)
Monocytes Relative: 11 %
Neutro Abs: 3.4 10*3/uL (ref 1.7–7.7)
Neutrophils Relative %: 70 %
Platelets: 114 10*3/uL — ABNORMAL LOW (ref 150–400)
RBC: 3.66 MIL/uL — ABNORMAL LOW (ref 4.22–5.81)
RDW: 16.2 % — ABNORMAL HIGH (ref 11.5–15.5)
WBC: 4.9 10*3/uL (ref 4.0–10.5)
nRBC: 0 % (ref 0.0–0.2)

## 2023-01-30 LAB — GLUCOSE, CAPILLARY
Glucose-Capillary: 106 mg/dL — ABNORMAL HIGH (ref 70–99)
Glucose-Capillary: 80 mg/dL (ref 70–99)
Glucose-Capillary: 80 mg/dL (ref 70–99)
Glucose-Capillary: 82 mg/dL (ref 70–99)
Glucose-Capillary: 88 mg/dL (ref 70–99)
Glucose-Capillary: 93 mg/dL (ref 70–99)

## 2023-01-30 LAB — COMPREHENSIVE METABOLIC PANEL
ALT: 41 U/L (ref 0–44)
AST: 65 U/L — ABNORMAL HIGH (ref 15–41)
Albumin: 2.3 g/dL — ABNORMAL LOW (ref 3.5–5.0)
Alkaline Phosphatase: 79 U/L (ref 38–126)
Anion gap: 10 (ref 5–15)
BUN: 9 mg/dL (ref 6–20)
CO2: 21 mmol/L — ABNORMAL LOW (ref 22–32)
Calcium: 7.4 mg/dL — ABNORMAL LOW (ref 8.9–10.3)
Chloride: 101 mmol/L (ref 98–111)
Creatinine, Ser: 0.54 mg/dL — ABNORMAL LOW (ref 0.61–1.24)
GFR, Estimated: >60 mL/min (ref >60)
Glucose, Bld: 80 mg/dL (ref 70–99)
Potassium: 3.4 mmol/L — ABNORMAL LOW (ref 3.5–5.1)
Sodium: 132 mmol/L — ABNORMAL LOW (ref 135–145)
Total Bilirubin: 1.7 mg/dL — ABNORMAL HIGH (ref 0.3–1.2)
Total Protein: 4.7 g/dL — ABNORMAL LOW (ref 6.5–8.1)

## 2023-01-30 LAB — MAGNESIUM: Magnesium: 1.9 mg/dL (ref 1.7–2.4)

## 2023-01-30 LAB — PHOSPHORUS: Phosphorus: 3.4 mg/dL (ref 2.5–4.6)

## 2023-01-30 LAB — AMMONIA: Ammonia: 54 umol/L — ABNORMAL HIGH (ref 9–35)

## 2023-01-30 MED ORDER — FOLIC ACID 5 MG/ML IJ SOLN
1.0000 mg | Freq: Every day | INTRAMUSCULAR | Status: DC
  Administered 2023-01-30 – 2023-02-01 (×3): 1 mg via INTRAVENOUS
  Filled 2023-01-30 (×2): qty 0.2

## 2023-01-30 MED ORDER — POTASSIUM CHLORIDE IN NACL 40-0.9 MEQ/L-% IV SOLN
INTRAVENOUS | Status: AC
  Administered 2023-01-30: 75 mL/h via INTRAVENOUS

## 2023-01-30 NOTE — Progress Notes (Signed)
Dr. Arbutus Leas notified that K+ was 3.4 on AM labs and that NS with 40 mEq of K+ was set to be discontinued at noon.

## 2023-01-30 NOTE — Progress Notes (Signed)
Patient noted to only have of amber colored urine since 7am. Bladder scan done with result of . Patient has no complaints of abdominal discomfort or pain. Dr Tat made aware of urine output, color and bladder scan result. No new orders at this time and continue to monitor per Dr Tat.

## 2023-01-30 NOTE — Progress Notes (Addendum)
PROGRESS NOTE  Connor Copeland VWU:[id number] DOB: 01/05/1976 DOA: 01/28/2023 PCP: Benita Stabile, MD  Brief History:  47 year old male long time heavy alcohol abuse (at least one fifth of liquor daily), chronic painful peripheral neuropathy, gout, tobacco abuser (2 ppd), hyperlipidemia, hypertriglyceridemia, FH CAD has been having increasing symptoms of painful neuropathy that has progressed over the past 2 months.  He has been evaluated by primary care and neurology.  He recently saw neurology and was started on lamictal rapid taper up. Pt reports he stopped taking it.  He finally decided that alcohol was the cause of his painful feet and legs and he stopped alcohol completely 2 days PTA.   His mother reported that he has not been eating or drinking fluids in the past couple of days.  She reported that he was in alcohol rehab about 10 years ago but has been drinking consistently heavily every day for past 10 years.  She came home to witness patient sitting in a recliner having a tonic clonic seizure.  He bit his tongue and lost control of his bowel and bladder.  [facility] was called and brought him into the ED. Seizure had stopped by the time he arrived, but pt was post-ictal and lethargic.  His BS was normal.    He was evaluated in ED and found to have normal head CT and CT cervical spine.  He had evidence of tongue lacerations and was noted to have hyponatremia and mild LFT elevation.  He is being admitted to stepdown ICU for acute alcohol withdrawal.   He remained agitated on CIWA ativan.  He was started on precedex on 01/29/23    Assessment/Plan: Acute Alcohol Withdrawal / Alcohol withdrawal seizure  - Pt stopped alcohol completely 2 days PTA after drinking heavily everyday for at least 10 years - tonic clonic seizure witnessed by his mother  - remains agitated and combative on CIWA Ativan - continue precedex>>start wean if possible - continue IV Ativan - continue IVF - urine  toxicology screen>>positive opiates - seizure precautions ordered  - when medically stabilized TOC consultation for alcohol treatment program options   Tongue Laceration  - ordered peridex mouth rinse three times daily x 4 days   Tobacco abuser - smokes at least 2 ppd per mother - nicotine patch 21 mg daily ordered, could add an additional patch 14 or 21 mg if needed - counseled on tobacco cessation but he does not seem ready at this time   painful peripheral polyneuropathy - secondary to chronic daily alcohol abuse - he was recently started on lamictal by neurology but stopped taking it - we did not restart the medication  - resumed home hydrocodone/APAP started by PCP last month - mother reports that he had been taking them frequently at home contrary to the prescription directions - PDMP reviewed--Norco 7.5/325, #90, last refill 01/20/23 -Ativan 0.5mg , #30, last refill 12/27/22  Hypokalemia -repleting -added KCl to IVF   Hyponatremia  - secondary to poor oral intake  - hydrating with IV fluid - Na down to 130 don't think this was low enough to precipitate seizure   Elevated transaminases - he had normal LFTs 01/10/23 - trending down - mother also reported that patient was taking hydrocodone/APAP tabs much more frequently than prescribed  Hyperammonemia -level 54 -clinical significance unclear presently -monitor mentation as sedation is weaned  Thrombocytopenia -due to Etoh -monitor for signs of bleeding  Family Communication:   mother at bedside 6/27   Consultants:  none   Code Status:  FULL    DVT Prophylaxis:[place] Lovenox          Subjective: Pt awakens to voice.  Denies cp, sob.  Remainder ROS not obtainable due to pharmacologic sedation  Objective: Vitals:   01/30/23 0500 01/30/23 0521 01/30/23 0600 01/30/23 0700  BP: (!) 136/95  (!) 138/93   Pulse: 73  66   Resp: 16  (!) 21   Temp:  (!) 96.5 F (35.8 C)  98.4 F (36.9 C)  TempSrc:   Axillary  Axillary  SpO2: 98%  98%   Weight:      Height:        Intake/Output Summary (Last 24 hours) at 01/30/2023 0841 Last data filed at 01/30/2023 0517 Gross per 24 hour  Intake 3160.33 ml  Output 1550 ml  Net 1610.33 ml   Weight change:  Exam:  General:  Pt is alert, NAD, awakens to voice HEENT: No icterus, No thrush, No neck mass, [place]/AT Cardiovascular: RRR, S1/S2, no rubs, no gallops Respiratory: bibasilar crackles.  No wheeze Abdomen: Soft/+BS, non tender, non distended, no guarding Extremities: No edema, No lymphangitis, No petechiae, No rashes, no synovitis   Data Reviewed: I have personally reviewed following labs and imaging studies Basic Metabolic Panel: Recent Labs  Lab 01/28/23 0940 01/28/23 1221 01/29/23 0423 01/30/23 0425  NA 130*  --  130* 132*  K 3.5  --  2.8* 3.4*  CL 92*  --  95* 101  CO2 22  --  24 21*  GLUCOSE 97  --  71 80  BUN 7  --  5* 9  CREATININE 0.80 0.58* 0.56* 0.54*  CALCIUM 7.4*  --  7.2* 7.4*  MG  --  2.1 1.9 1.9  PHOS  --   --  2.7 3.4   Liver Function Tests: Recent Labs  Lab 01/28/23 0940 01/29/23 0423 01/30/23 0425  AST 99* 74* 65*  ALT 54* 44 41  ALKPHOS 84 74 79  BILITOT 1.9* 1.6* 1.7*  PROT 5.2* 4.7* 4.7*  ALBUMIN 2.8* 2.5* 2.3*   No results for input(s): "LIPASE", "AMYLASE" in the last 168 hours. Recent Labs  Lab 01/30/23 0425  AMMONIA 54*   Coagulation Profile: No results for input(s): "INR", "PROTIME" in the last 168 hours. CBC: Recent Labs  Lab 01/28/23 0940 01/28/23 1221 01/29/23 0423 01/30/23 0425  WBC 7.4 6.6 6.1 4.9  NEUTROABS 5.5  --  4.1 3.4  HGB 13.6 13.0 13.2 13.2  HCT 39.4 37.1* 37.8* 37.3*  MCV 102.3* 100.3* 101.1* 101.9*  PLT 135* 113* 106* 114*   Cardiac Enzymes: Recent Labs  Lab 01/28/23 0940  CKTOTAL 192   BNP: Invalid input(s): "POCBNP" CBG: Recent Labs  Lab 01/29/23 1625 01/29/23 2110 01/30/23 0022 01/30/23 0515 01/30/23 0718  GLUCAP 69* 79 88 80 82   HbA1C: No  results for input(s): "HGBA1C" in the last 72 hours. Urine analysis:    Component Value Date/Time   COLORURINE YELLOW 01/10/2023 1019   APPEARANCEUR CLEAR 01/10/2023 1019   LABSPEC 1.013 01/10/2023 1019   PHURINE 5.5 01/10/2023 1019   GLUCOSEU NEGATIVE 01/10/2023 1019   HGBUR NEGATIVE 01/10/2023 1019   BILIRUBINUR NEGATIVE 01/10/2023 1019   KETONESUR NEGATIVE 01/10/2023 1019   PROTEINUR NEGATIVE 01/10/2023 1019   NITRITE NEGATIVE 01/10/2023 1019   LEUKOCYTESUR NEGATIVE 01/10/2023 1019   Sepsis Labs: @LABRCNTIP (procalcitonin:4,lacticidven:4) ) Recent Results (from the past 240 hour(s))  MRSA Next  Gen by PCR, Nasal     Status: None   Collection Time: 01/28/23 12:45 PM   Specimen: Nasal Mucosa; Nasal Swab  Result Value Ref Range Status   MRSA by PCR Next Gen NOT DETECTED NOT DETECTED Final    Comment: (NOTE) The GeneXpert MRSA Assay (FDA approved for NASAL specimens only), is one component of a comprehensive MRSA colonization surveillance program. It is not intended to diagnose MRSA infection nor to guide or monitor treatment for MRSA infections. Test performance is not FDA approved in patients less than 33 years old. Performed at [facility], [address] [place], [place] [address]      Scheduled Meds:  antiseptic oral rinse  15 mL Mouth Rinse TID   chlordiazePOXIDE  25 mg Oral TID   Chlorhexidine Gluconate Cloth  6 each Topical Daily   enoxaparin (LOVENOX) injection  40 mg Subcutaneous Q24H   folic acid  1 mg Intravenous Daily   multivitamin with minerals  1 tablet Oral Daily   nicotine  21 mg Transdermal Daily   mouth rinse  15 mL Mouth Rinse 4 times per day   pantoprazole (PROTONIX) IV  40 mg Intravenous Q24H   thiamine  100 mg Oral Daily   Or   thiamine  100 mg Intravenous Daily   Continuous Infusions:  0.9 % NaCl with KCl 40 mEq / L 75 mL/hr at 01/30/23 0517   dexmedetomidine (PRECEDEX) IV infusion 0.8 mcg/kg/hr (01/30/23 0541)    Procedures/Studies: CT  Cervical Spine Wo Contrast  Result Date: 01/28/2023 CLINICAL DATA:  Polytrauma, blunt EXAM: CT CERVICAL SPINE WITHOUT CONTRAST TECHNIQUE: Multidetector CT imaging of the cervical spine was performed without intravenous contrast. Multiplanar CT image reconstructions were also generated. RADIATION DOSE REDUCTION: This exam was performed according to the departmental dose-optimization program which includes automated exposure control, adjustment of the mA and/or kV according to patient size and/or use of iterative reconstruction technique. COMPARISON:  None Available. FINDINGS: Alignment: Normal Skull base and vertebrae: No acute fracture. No primary bone lesion or focal pathologic process. Soft tissues and spinal canal: No prevertebral fluid or swelling. No visible canal hematoma. Disc levels:  Disc space narrowing and early spurring at C6-7. Upper chest: No acute findings Other: None IMPRESSION: No acute bony abnormality. Electronically Signed   By: Charlett Nose M.D.   On: 01/28/2023 10:09   CT Head Wo Contrast  Result Date: 01/28/2023 CLINICAL DATA:  Seizure, new-onset, history of trauma EXAM: CT HEAD WITHOUT CONTRAST TECHNIQUE: Contiguous axial images were obtained from the base of the skull through the vertex without intravenous contrast. RADIATION DOSE REDUCTION: This exam was performed according to the departmental dose-optimization program which includes automated exposure control, adjustment of the mA and/or kV according to patient size and/or use of iterative reconstruction technique. COMPARISON:  None Available. FINDINGS: Brain: No acute intracranial abnormality. Specifically, no hemorrhage, hydrocephalus, mass lesion, acute infarction, or significant intracranial injury. Vascular: No hyperdense vessel or unexpected calcification. Skull: No acute calvarial abnormality. Sinuses/Orbits: No acute findings Other: None IMPRESSION: Normal study. Electronically Signed   By: Charlett Nose M.D.   On: 01/28/2023  10:03   DG Chest Port 1 View  Result Date: 01/28/2023 CLINICAL DATA:  47 year old male status post seizure. EXAM: PORTABLE CHEST 1 VIEW COMPARISON:  Chest radiographs 01/10/2023 and earlier. FINDINGS: Portable AP upright view at 0936 hours. Improved lung volumes. Normal cardiac size and mediastinal contours. Visualized tracheal air column is within normal limits. Allowing for portable technique the lungs are clear.  No pneumothorax or pleural effusion. No acute osseous abnormality identified. Paucity of visible bowel gas. IMPRESSION: Negative portable chest. Electronically Signed   By: Odessa Fleming M.D.   On: 01/28/2023 09:47   ECHOCARDIOGRAM COMPLETE  Result Date: 01/22/2023    ECHOCARDIOGRAM REPORT   Patient Name:   Connor Copeland Date of Exam: 01/22/2023 Medical Rec #:  [id number]              Height:       74.0 in Accession #:    [id number]             Weight:       228.0 lb Date of Birth:  Oct 17, 1975              BSA:          2.298 m Patient Age:    46 years               BP:           114/83 mmHg Patient Gender: M                      HR:           103 bpm. [name] Procedure: 2D Echo, Cardiac Doppler and Color Doppler Indications:    Leg swelling  History:        Patient has no prior history of Echocardiogram examinations.                 Risk Factors:Dyslipidemia. Leg swelling.  Sonographer:    Mikki Harbor Referring Phys: [id number] VISHNU P MALLIPEDDI  Sonographer Comments: Patient is obese. IMPRESSIONS  1. Left ventricular ejection fraction, by estimation, is 65 to 70%. The left ventricle has normal function. The left ventricle has no regional wall motion abnormalities. There is mild concentric left ventricular hypertrophy. Left ventricular diastolic parameters are consistent with Grade I diastolic dysfunction (impaired relaxation).  2. Right ventricular systolic function is normal. The right ventricular size is normal. Tricuspid regurgitation signal is inadequate for assessing  PA pressure.  3. The mitral valve is grossly normal. Trivial mitral valve regurgitation.  4. The aortic valve was not well visualized, likely tricuspid. Aortic valve regurgitation is not visualized. No aortic stenosis is present. Aortic valve mean gradient measures 3.0 mmHg.  5. Aortic dilatation noted. There is mild dilatation of the aortic root, measuring 41 mm.  6. The inferior vena cava is normal in size with greater than 50% respiratory variability, suggesting right atrial pressure of 3 mmHg. Comparison(s): No prior Echocardiogram. FINDINGS  Left Ventricle: Left ventricular ejection fraction, by estimation, is 65 to 70%. The left ventricle has normal function. The left ventricle has no regional wall motion abnormalities. The left ventricular internal cavity size was normal in size. There is  mild concentric left ventricular hypertrophy. Left ventricular diastolic parameters are consistent with Grade I diastolic dysfunction (impaired relaxation). Right Ventricle: The right ventricular size is normal. No increase in right ventricular wall thickness. Right ventricular systolic function is normal. Tricuspid regurgitation signal is inadequate for assessing PA pressure. Left Atrium: Left atrial size was normal in size. Right Atrium: Right atrial size was normal in size. Pericardium: There is no evidence of pericardial effusion. Presence of epicardial fat layer. Mitral Valve: The mitral valve is grossly normal. Trivial mitral valve regurgitation. MV peak gradient, 3.7 mmHg. The mean mitral valve gradient is 1.5 mmHg. Tricuspid Valve: The tricuspid valve is grossly normal. Tricuspid  valve regurgitation is trivial. Aortic Valve: The aortic valve was not well visualized. There is mild aortic valve annular calcification. Aortic valve regurgitation is not visualized. No aortic stenosis is present. Aortic valve mean gradient measures 3.0 mmHg. Aortic valve peak gradient measures 6.9 mmHg. Aortic valve area, by VTI measures  2.78 cm. Pulmonic Valve: The pulmonic valve was grossly normal. Pulmonic valve regurgitation is trivial. Aorta: Aortic dilatation noted. There is mild dilatation of the aortic root, measuring 41 mm. Venous: The inferior vena cava is normal in size with greater than 50% respiratory variability, suggesting right atrial pressure of 3 mmHg. IAS/Shunts: No atrial level shunt detected by color flow Doppler.  LEFT VENTRICLE PLAX 2D LVIDd:         4.20 cm   Diastology LVIDs:         2.60 cm   LV e' medial:    7.62 cm/s LV PW:         1.10 cm   LV E/e' medial:  7.0 LV IVS:        1.30 cm   LV e' lateral:   10.10 cm/s LVOT diam:     2.10 cm   LV E/e' lateral: 5.3 LV SV:         62 LV SV Index:   27 LVOT Area:     3.46 cm  RIGHT VENTRICLE RV Basal diam:  3.00 cm RV Mid diam:    2.70 cm RV S prime:     13.80 cm/s TAPSE (M-mode): 2.6 cm LEFT ATRIUM             Index LA diam:        3.40 cm 1.48 cm/m LA Vol (A2C):   36.2 ml 15.75 ml/m LA Vol (A4C):   42.7 ml 18.58 ml/m LA Biplane Vol: 39.7 ml 17.28 ml/m  AORTIC VALVE AV Area (Vmax):    2.31 cm AV Area (Vmean):   2.59 cm AV Area (VTI):     2.78 cm AV Vmax:           131.00 cm/s AV Vmean:          75.300 cm/s AV VTI:            0.222 m AV Peak Grad:      6.9 mmHg AV Mean Grad:      3.0 mmHg LVOT Vmax:         87.20 cm/s LVOT Vmean:        56.300 cm/s LVOT VTI:          0.178 m LVOT/AV VTI ratio: 0.80  AORTA Ao Root diam: 4.10 cm Ao Arch diam: 3.7 cm MITRAL VALVE MV Area (PHT): 5.62 cm    SHUNTS MV Area VTI:   3.38 cm    Systemic VTI:  0.18 m MV Peak grad:  3.7 mmHg    Systemic Diam: 2.10 cm MV Mean grad:  1.5 mmHg MV Vmax:       0.96 m/s MV Vmean:      57.0 cm/s MV Decel Time: 135 msec MV E velocity: 53.50 cm/s MV A velocity: 85.00 cm/s MV E/A ratio:  0.63 Nona Dell MD Electronically signed by Nona Dell MD Signature Date/Time: 01/22/2023/2:06:29 PM    Final    DG Chest 2 View  Result Date: 01/10/2023 CLINICAL DATA:  Thrush, chest pain. EXAM: CHEST - 2 VIEW  COMPARISON:  12/12/2022. FINDINGS: Low lung volumes accentuate the pulmonary vasculature and cardiomediastinal silhouette. No consolidation pulmonary edema. No pleural  effusion or pneumothorax. Visualized bones and upper abdomen are unremarkable. IMPRESSION: No evidence of acute cardiopulmonary disease. Electronically Signed   By: Orvan Falconer M.D.   On: 01/10/2023 11:16    Catarina Hartshorn, DO  [facility]  If 7PM-7AM, please contact night-coverage [email or web address] Password [email or web address] 01/30/2023, 8:41 AM   LOS: 2 days

## 2023-01-31 DIAGNOSIS — F10931 Alcohol use, unspecified with withdrawal delirium: Secondary | ICD-10-CM | POA: Diagnosis not present

## 2023-01-31 DIAGNOSIS — R569 Unspecified convulsions: Secondary | ICD-10-CM | POA: Diagnosis not present

## 2023-01-31 DIAGNOSIS — R7401 Elevation of levels of liver transaminase levels: Secondary | ICD-10-CM | POA: Diagnosis not present

## 2023-01-31 DIAGNOSIS — F17200 Nicotine dependence, unspecified, uncomplicated: Secondary | ICD-10-CM | POA: Diagnosis not present

## 2023-01-31 LAB — CBC WITH DIFFERENTIAL/PLATELET
Abs Immature Granulocytes: 0.08 10*3/uL — ABNORMAL HIGH (ref 0.00–0.07)
Basophils Absolute: 0 10*3/uL (ref 0.0–0.1)
Basophils Relative: 1 %
Eosinophils Absolute: 0.2 10*3/uL (ref 0.0–0.5)
Eosinophils Relative: 2 %
HCT: 37.5 % — ABNORMAL LOW (ref 39.0–52.0)
Hemoglobin: 12.9 g/dL — ABNORMAL LOW (ref 13.0–17.0)
Immature Granulocytes: 1 %
Lymphocytes Relative: 16 %
Lymphs Abs: 1.1 10*3/uL (ref 0.7–4.0)
MCH: 35 pg — ABNORMAL HIGH (ref 26.0–34.0)
MCHC: 34.4 g/dL (ref 30.0–36.0)
MCV: 101.6 fL — ABNORMAL HIGH (ref 80.0–100.0)
Monocytes Absolute: 0.8 10*3/uL (ref 0.1–1.0)
Monocytes Relative: 13 %
Neutro Abs: 4.5 10*3/uL (ref 1.7–7.7)
Neutrophils Relative %: 67 %
Platelets: 100 10*3/uL — ABNORMAL LOW (ref 150–400)
RBC: 3.69 MIL/uL — ABNORMAL LOW (ref 4.22–5.81)
RDW: 15.9 % — ABNORMAL HIGH (ref 11.5–15.5)
WBC: 6.6 10*3/uL (ref 4.0–10.5)
nRBC: 0 % (ref 0.0–0.2)

## 2023-01-31 LAB — GLUCOSE, CAPILLARY
Glucose-Capillary: 102 mg/dL — ABNORMAL HIGH (ref 70–99)
Glucose-Capillary: 104 mg/dL — ABNORMAL HIGH (ref 70–99)
Glucose-Capillary: 104 mg/dL — ABNORMAL HIGH (ref 70–99)
Glucose-Capillary: 73 mg/dL (ref 70–99)
Glucose-Capillary: 76 mg/dL (ref 70–99)
Glucose-Capillary: 87 mg/dL (ref 70–99)
Glucose-Capillary: 93 mg/dL (ref 70–99)

## 2023-01-31 LAB — MAGNESIUM: Magnesium: 1.6 mg/dL — ABNORMAL LOW (ref 1.7–2.4)

## 2023-01-31 LAB — COMPREHENSIVE METABOLIC PANEL
ALT: 40 U/L (ref 0–44)
AST: 59 U/L — ABNORMAL HIGH (ref 15–41)
Albumin: 2.3 g/dL — ABNORMAL LOW (ref 3.5–5.0)
Alkaline Phosphatase: 80 U/L (ref 38–126)
Anion gap: 9 (ref 5–15)
BUN: 9 mg/dL (ref 6–20)
CO2: 21 mmol/L — ABNORMAL LOW (ref 22–32)
Calcium: 7.3 mg/dL — ABNORMAL LOW (ref 8.9–10.3)
Chloride: 102 mmol/L (ref 98–111)
Creatinine, Ser: 0.63 mg/dL (ref 0.61–1.24)
GFR, Estimated: >60 mL/min (ref >60)
Glucose, Bld: 94 mg/dL (ref 70–99)
Potassium: 3.6 mmol/L (ref 3.5–5.1)
Sodium: 132 mmol/L — ABNORMAL LOW (ref 135–145)
Total Bilirubin: 1.4 mg/dL — ABNORMAL HIGH (ref 0.3–1.2)
Total Protein: 4.8 g/dL — ABNORMAL LOW (ref 6.5–8.1)

## 2023-01-31 MED ORDER — MAGNESIUM SULFATE 2 GM/50ML IV SOLN
2.0000 g | Freq: Once | INTRAVENOUS | Status: AC
  Administered 2023-01-31: 2 g via INTRAVENOUS
  Filled 2023-01-31: qty 50

## 2023-01-31 MED ORDER — ACETAMINOPHEN 325 MG PO TABS
650.0000 mg | ORAL_TABLET | Freq: Four times a day (QID) | ORAL | Status: DC | PRN
  Administered 2023-01-31: 650 mg via ORAL
  Filled 2023-01-31: qty 2

## 2023-01-31 NOTE — Progress Notes (Signed)
Per Nurse, patient remains lethargic. TOC aware of consult for SA resources. SA resources will be discussed once patient is more cognitively appropriate.    Denae Zulueta, Juleen China, LCSW

## 2023-01-31 NOTE — Progress Notes (Signed)
PROGRESS NOTE  Connor Copeland ZOX:[id number] DOB: Oct 04, 1975 DOA: 01/28/2023 PCP: Benita Stabile, MD  Brief History:  47 year old male long time heavy alcohol abuse (at least one fifth of liquor daily), chronic painful peripheral neuropathy, gout, tobacco abuser (2 ppd), hyperlipidemia, hypertriglyceridemia, FH CAD has been having increasing symptoms of painful neuropathy that has progressed over the past 2 months.  He has been evaluated by primary care and neurology.  He recently saw neurology and was started on lamictal rapid taper up. Pt reports he stopped taking it.  He finally decided that alcohol was the cause of his painful feet and legs and he stopped alcohol completely 2 days PTA.   His mother reported that he has not been eating or drinking fluids in the past couple of days.  She reported that he was in alcohol rehab about 10 years ago but has been drinking consistently heavily every day for past 10 years.  She came home to witness patient sitting in a recliner having a tonic clonic seizure.  He bit his tongue and lost control of his bowel and bladder.  [facility] was called and brought him into the ED. Seizure had stopped by the time he arrived, but pt was post-ictal and lethargic.  His BS was normal.    He was evaluated in ED and found to have normal head CT and CT cervical spine.  He had evidence of tongue lacerations and was noted to have hyponatremia and mild LFT elevation.  He is being admitted to stepdown ICU for acute alcohol withdrawal.   He remained agitated on CIWA ativan.  He was started on precedex on 01/29/23    Assessment/Plan:  Acute Alcohol Withdrawal / Alcohol withdrawal seizure  - Pt stopped alcohol completely 2 days PTA after drinking heavily everyday for at least 10 years - tonic clonic seizure witnessed by his mother  - remains agitated and combative on CIWA Ativan - continue precedex>>continue to wean - continue IV Ativan - continue IVF - urine  toxicology screen>>positive opiates - seizure precautions ordered  - when medically stabilized TOC consultation for alcohol treatment program options   Tongue Laceration  - ordered peridex mouth rinse three times daily x 4 days   Tobacco abuser - smokes at least 2 ppd per mother - nicotine patch 21 mg daily ordered, could add an additional patch 14 or 21 mg if needed - counseled on tobacco cessation but he does not seem ready at this time   painful peripheral polyneuropathy - secondary to chronic daily alcohol abuse - he was recently started on lamictal by neurology but stopped taking it - we did not restart the medication  - resumed home hydrocodone/APAP started by PCP last month - mother reports that he had been taking them frequently at home contrary to the prescription directions - PDMP reviewed--Norco 7.5/325, #90, last refill 01/20/23 -Ativan 0.5mg , #30, last refill 12/27/22   Hypokalemia -repleting -added KCl to IVF   Hyponatremia  - secondary to poor oral intake  - hydrating with IV fluid--improving   Elevated transaminases - he had normal LFTs 01/10/23 - trending down - mother also reported that patient was taking hydrocodone/APAP tabs much more frequently than prescribed   Hyperammonemia -level 54 -clinical significance unclear presently -monitor mentation as sedation is weaned   Thrombocytopenia -due to Etoh -monitor for signs of bleeding       Family Communication:   mother at bedside 6/28  Consultants:  none   Code Status:  FULL    DVT Prophylaxis:[place] Lovenox       Subjective: Pt complains of back pain.  Denies cp, sob, n/vd, abd pain  Objective: Vitals:   01/31/23 1600 01/31/23 1700 01/31/23 1730 01/31/23 1800  BP: (!) 125/94 (!) 87/53 93/70 108/83  Pulse: 74 (!) 109 82 88  Resp: 20 (!) 23 (!) 21 16  Temp: 97.9 F (36.6 C)     TempSrc: Oral     SpO2: 100% 97% 97% 98%  Weight:      Height:        Intake/Output Summary (Last 24 hours)  at 01/31/2023 1834 Last data filed at 01/31/2023 1800 Gross per 24 hour  Intake 2606.42 ml  Output 2500 ml  Net 106.42 ml   Weight change:  Exam:  General:  Pt is alert, follows commands appropriately, not in acute distress HEENT: No icterus, No thrush, No neck mass, [place]/AT Cardiovascular: RRR, S1/S2, no rubs, no gallops Respiratory: CTA bilaterally, no wheezing, no crackles, no rhonchi Abdomen: Soft/+BS, non tender, non distended, no guarding Extremities: No edema, No lymphangitis, No petechiae, No rashes, no synovitis   Data Reviewed: I have personally reviewed following labs and imaging studies Basic Metabolic Panel: Recent Labs  Lab 01/28/23 0940 01/28/23 1221 01/29/23 0423 01/30/23 0425 01/31/23 0415  NA 130*  --  130* 132* 132*  K 3.5  --  2.8* 3.4* 3.6  CL 92*  --  95* 101 102  CO2 22  --  24 21* 21*  GLUCOSE 97  --  71 80 94  BUN 7  --  5* 9 9  CREATININE 0.80 0.58* 0.56* 0.54* 0.63  CALCIUM 7.4*  --  7.2* 7.4* 7.3*  MG  --  2.1 1.9 1.9 1.6*  PHOS  --   --  2.7 3.4  --    Liver Function Tests: Recent Labs  Lab 01/28/23 0940 01/29/23 0423 01/30/23 0425 01/31/23 0415  AST 99* 74* 65* 59*  ALT 54* 44 41 40  ALKPHOS 84 74 79 80  BILITOT 1.9* 1.6* 1.7* 1.4*  PROT 5.2* 4.7* 4.7* 4.8*  ALBUMIN 2.8* 2.5* 2.3* 2.3*   No results for input(s): "LIPASE", "AMYLASE" in the last 168 hours. Recent Labs  Lab 01/30/23 0425  AMMONIA 54*   Coagulation Profile: No results for input(s): "INR", "PROTIME" in the last 168 hours. CBC: Recent Labs  Lab 01/28/23 0940 01/28/23 1221 01/29/23 0423 01/30/23 0425 01/31/23 0415  WBC 7.4 6.6 6.1 4.9 6.6  NEUTROABS 5.5  --  4.1 3.4 4.5  HGB 13.6 13.0 13.2 13.2 12.9*  HCT 39.4 37.1* 37.8* 37.3* 37.5*  MCV 102.3* 100.3* 101.1* 101.9* 101.6*  PLT 135* 113* 106* 114* 100*   Cardiac Enzymes: Recent Labs  Lab 01/28/23 0940  CKTOTAL 192   BNP: Invalid input(s): "POCBNP" CBG: Recent Labs  Lab 01/31/23 0043  01/31/23 0414 01/31/23 0759 01/31/23 1114 01/31/23 1605  GLUCAP 76 102* 87 104* 104*   HbA1C: No results for input(s): "HGBA1C" in the last 72 hours. Urine analysis:    Component Value Date/Time   COLORURINE YELLOW 01/10/2023 1019   APPEARANCEUR CLEAR 01/10/2023 1019   LABSPEC 1.013 01/10/2023 1019   PHURINE 5.5 01/10/2023 1019   GLUCOSEU NEGATIVE 01/10/2023 1019   HGBUR NEGATIVE 01/10/2023 1019   BILIRUBINUR NEGATIVE 01/10/2023 1019   KETONESUR NEGATIVE 01/10/2023 1019   PROTEINUR NEGATIVE 01/10/2023 1019   NITRITE NEGATIVE 01/10/2023 1019   LEUKOCYTESUR NEGATIVE 01/10/2023 1019  Sepsis Labs: @LABRCNTIP (procalcitonin:4,lacticidven:4) ) Recent Results (from the past 240 hour(s))  MRSA Next Gen by PCR, Nasal     Status: None   Collection Time: 01/28/23 12:45 PM   Specimen: Nasal Mucosa; Nasal Swab  Result Value Ref Range Status   MRSA by PCR Next Gen NOT DETECTED NOT DETECTED Final    Comment: (NOTE) The GeneXpert MRSA Assay (FDA approved for NASAL specimens only), is one component of a comprehensive MRSA colonization surveillance program. It is not intended to diagnose MRSA infection nor to guide or monitor treatment for MRSA infections. Test performance is not FDA approved in patients less than 42 years old. Performed at [facility], [address], [place], [place] [address]      Scheduled Meds:  antiseptic oral rinse  15 mL Mouth Rinse TID   chlordiazePOXIDE  25 mg Oral TID   Chlorhexidine Gluconate Cloth  6 each Topical Daily   enoxaparin (LOVENOX) injection  40 mg Subcutaneous Q24H   folic acid  1 mg Intravenous Daily   multivitamin with minerals  1 tablet Oral Daily   nicotine  21 mg Transdermal Daily   mouth rinse  15 mL Mouth Rinse 4 times per day   pantoprazole (PROTONIX) IV  40 mg Intravenous Q24H   thiamine  100 mg Oral Daily   Or   thiamine  100 mg Intravenous Daily   Continuous Infusions:  dexmedetomidine (PRECEDEX) IV infusion 0.4 mcg/kg/hr  (01/31/23 1800)    Procedures/Studies: CT Cervical Spine Wo Contrast  Result Date: 01/28/2023 CLINICAL DATA:  Polytrauma, blunt EXAM: CT CERVICAL SPINE WITHOUT CONTRAST TECHNIQUE: Multidetector CT imaging of the cervical spine was performed without intravenous contrast. Multiplanar CT image reconstructions were also generated. RADIATION DOSE REDUCTION: This exam was performed according to the departmental dose-optimization program which includes automated exposure control, adjustment of the mA and/or kV according to patient size and/or use of iterative reconstruction technique. COMPARISON:  None Available. FINDINGS: Alignment: Normal Skull base and vertebrae: No acute fracture. No primary bone lesion or focal pathologic process. Soft tissues and spinal canal: No prevertebral fluid or swelling. No visible canal hematoma. Disc levels:  Disc space narrowing and early spurring at C6-7. Upper chest: No acute findings Other: None IMPRESSION: No acute bony abnormality. Electronically Signed   By: Charlett Nose M.D.   On: 01/28/2023 10:09   CT Head Wo Contrast  Result Date: 01/28/2023 CLINICAL DATA:  Seizure, new-onset, history of trauma EXAM: CT HEAD WITHOUT CONTRAST TECHNIQUE: Contiguous axial images were obtained from the base of the skull through the vertex without intravenous contrast. RADIATION DOSE REDUCTION: This exam was performed according to the departmental dose-optimization program which includes automated exposure control, adjustment of the mA and/or kV according to patient size and/or use of iterative reconstruction technique. COMPARISON:  None Available. FINDINGS: Brain: No acute intracranial abnormality. Specifically, no hemorrhage, hydrocephalus, mass lesion, acute infarction, or significant intracranial injury. Vascular: No hyperdense vessel or unexpected calcification. Skull: No acute calvarial abnormality. Sinuses/Orbits: No acute findings Other: None IMPRESSION: Normal study. Electronically  Signed   By: Charlett Nose M.D.   On: 01/28/2023 10:03   DG Chest Port 1 View  Result Date: 01/28/2023 CLINICAL DATA:  47 year old male status post seizure. EXAM: PORTABLE CHEST 1 VIEW COMPARISON:  Chest radiographs 01/10/2023 and earlier. FINDINGS: Portable AP upright view at 0936 hours. Improved lung volumes. Normal cardiac size and mediastinal contours. Visualized tracheal air column is within normal limits. Allowing for portable technique the lungs are clear. No pneumothorax  or pleural effusion. No acute osseous abnormality identified. Paucity of visible bowel gas. IMPRESSION: Negative portable chest. Electronically Signed   By: Odessa Fleming M.D.   On: 01/28/2023 09:47   ECHOCARDIOGRAM COMPLETE  Result Date: 01/22/2023    ECHOCARDIOGRAM REPORT   Patient Name:   KRISTIAN BATDORF Date of Exam: 01/22/2023 Medical Rec #:  [id number]              Height:       74.0 in Accession #:    [id number]             Weight:       228.0 lb Date of Birth:  05-Jul-1976              BSA:          2.298 m Patient Age:    46 years               BP:           114/83 mmHg Patient Gender: M                      HR:           103 bpm. [name] Procedure: 2D Echo, Cardiac Doppler and Color Doppler Indications:    Leg swelling  History:        Patient has no prior history of Echocardiogram examinations.                 Risk Factors:Dyslipidemia. Leg swelling.  Sonographer:    Mikki Harbor Referring Phys: [id number] VISHNU P MALLIPEDDI  Sonographer Comments: Patient is obese. IMPRESSIONS  1. Left ventricular ejection fraction, by estimation, is 65 to 70%. The left ventricle has normal function. The left ventricle has no regional wall motion abnormalities. There is mild concentric left ventricular hypertrophy. Left ventricular diastolic parameters are consistent with Grade I diastolic dysfunction (impaired relaxation).  2. Right ventricular systolic function is normal. The right ventricular size is normal. Tricuspid  regurgitation signal is inadequate for assessing PA pressure.  3. The mitral valve is grossly normal. Trivial mitral valve regurgitation.  4. The aortic valve was not well visualized, likely tricuspid. Aortic valve regurgitation is not visualized. No aortic stenosis is present. Aortic valve mean gradient measures 3.0 mmHg.  5. Aortic dilatation noted. There is mild dilatation of the aortic root, measuring 41 mm.  6. The inferior vena cava is normal in size with greater than 50% respiratory variability, suggesting right atrial pressure of 3 mmHg. Comparison(s): No prior Echocardiogram. FINDINGS  Left Ventricle: Left ventricular ejection fraction, by estimation, is 65 to 70%. The left ventricle has normal function. The left ventricle has no regional wall motion abnormalities. The left ventricular internal cavity size was normal in size. There is  mild concentric left ventricular hypertrophy. Left ventricular diastolic parameters are consistent with Grade I diastolic dysfunction (impaired relaxation). Right Ventricle: The right ventricular size is normal. No increase in right ventricular wall thickness. Right ventricular systolic function is normal. Tricuspid regurgitation signal is inadequate for assessing PA pressure. Left Atrium: Left atrial size was normal in size. Right Atrium: Right atrial size was normal in size. Pericardium: There is no evidence of pericardial effusion. Presence of epicardial fat layer. Mitral Valve: The mitral valve is grossly normal. Trivial mitral valve regurgitation. MV peak gradient, 3.7 mmHg. The mean mitral valve gradient is 1.5 mmHg. Tricuspid Valve: The tricuspid valve is grossly normal. Tricuspid valve regurgitation  is trivial. Aortic Valve: The aortic valve was not well visualized. There is mild aortic valve annular calcification. Aortic valve regurgitation is not visualized. No aortic stenosis is present. Aortic valve mean gradient measures 3.0 mmHg. Aortic valve peak gradient  measures 6.9 mmHg. Aortic valve area, by VTI measures 2.78 cm. Pulmonic Valve: The pulmonic valve was grossly normal. Pulmonic valve regurgitation is trivial. Aorta: Aortic dilatation noted. There is mild dilatation of the aortic root, measuring 41 mm. Venous: The inferior vena cava is normal in size with greater than 50% respiratory variability, suggesting right atrial pressure of 3 mmHg. IAS/Shunts: No atrial level shunt detected by color flow Doppler.  LEFT VENTRICLE PLAX 2D LVIDd:         4.20 cm   Diastology LVIDs:         2.60 cm   LV e' medial:    7.62 cm/s LV PW:         1.10 cm   LV E/e' medial:  7.0 LV IVS:        1.30 cm   LV e' lateral:   10.10 cm/s LVOT diam:     2.10 cm   LV E/e' lateral: 5.3 LV SV:         62 LV SV Index:   27 LVOT Area:     3.46 cm  RIGHT VENTRICLE RV Basal diam:  3.00 cm RV Mid diam:    2.70 cm RV S prime:     13.80 cm/s TAPSE (M-mode): 2.6 cm LEFT ATRIUM             Index LA diam:        3.40 cm 1.48 cm/m LA Vol (A2C):   36.2 ml 15.75 ml/m LA Vol (A4C):   42.7 ml 18.58 ml/m LA Biplane Vol: 39.7 ml 17.28 ml/m  AORTIC VALVE AV Area (Vmax):    2.31 cm AV Area (Vmean):   2.59 cm AV Area (VTI):     2.78 cm AV Vmax:           131.00 cm/s AV Vmean:          75.300 cm/s AV VTI:            0.222 m AV Peak Grad:      6.9 mmHg AV Mean Grad:      3.0 mmHg LVOT Vmax:         87.20 cm/s LVOT Vmean:        56.300 cm/s LVOT VTI:          0.178 m LVOT/AV VTI ratio: 0.80  AORTA Ao Root diam: 4.10 cm Ao Arch diam: 3.7 cm MITRAL VALVE MV Area (PHT): 5.62 cm    SHUNTS MV Area VTI:   3.38 cm    Systemic VTI:  0.18 m MV Peak grad:  3.7 mmHg    Systemic Diam: 2.10 cm MV Mean grad:  1.5 mmHg MV Vmax:       0.96 m/s MV Vmean:      57.0 cm/s MV Decel Time: 135 msec MV E velocity: 53.50 cm/s MV A velocity: 85.00 cm/s MV E/A ratio:  0.63 Nona Dell MD Electronically signed by Nona Dell MD Signature Date/Time: 01/22/2023/2:06:29 PM    Final    DG Chest 2 View  Result Date:  01/10/2023 CLINICAL DATA:  Thrush, chest pain. EXAM: CHEST - 2 VIEW COMPARISON:  12/12/2022. FINDINGS: Low lung volumes accentuate the pulmonary vasculature and cardiomediastinal silhouette. No consolidation pulmonary edema. No pleural effusion or  pneumothorax. Visualized bones and upper abdomen are unremarkable. IMPRESSION: No evidence of acute cardiopulmonary disease. Electronically Signed   By: Orvan Falconer M.D.   On: 01/10/2023 11:16    Catarina Hartshorn, DO  [facility]  If 7PM-7AM, please contact night-coverage [email or web address] Password [facility] 01/31/2023, 6:34 PM   LOS: 3 days

## 2023-02-01 DIAGNOSIS — R569 Unspecified convulsions: Secondary | ICD-10-CM | POA: Diagnosis not present

## 2023-02-01 DIAGNOSIS — F10931 Alcohol use, unspecified with withdrawal delirium: Secondary | ICD-10-CM | POA: Diagnosis not present

## 2023-02-01 DIAGNOSIS — F17200 Nicotine dependence, unspecified, uncomplicated: Secondary | ICD-10-CM | POA: Diagnosis not present

## 2023-02-01 LAB — COMPREHENSIVE METABOLIC PANEL
ALT: 38 U/L (ref 0–44)
AST: 42 U/L — ABNORMAL HIGH (ref 15–41)
Albumin: 2.2 g/dL — ABNORMAL LOW (ref 3.5–5.0)
Alkaline Phosphatase: 83 U/L (ref 38–126)
Anion gap: 8 (ref 5–15)
BUN: 7 mg/dL (ref 6–20)
CO2: 23 mmol/L (ref 22–32)
Calcium: 7.3 mg/dL — ABNORMAL LOW (ref 8.9–10.3)
Chloride: 101 mmol/L (ref 98–111)
Creatinine, Ser: 0.6 mg/dL — ABNORMAL LOW (ref 0.61–1.24)
GFR, Estimated: >60 mL/min (ref >60)
Glucose, Bld: 95 mg/dL (ref 70–99)
Potassium: 3.3 mmol/L — ABNORMAL LOW (ref 3.5–5.1)
Sodium: 132 mmol/L — ABNORMAL LOW (ref 135–145)
Total Bilirubin: 0.9 mg/dL (ref 0.3–1.2)
Total Protein: 4.9 g/dL — ABNORMAL LOW (ref 6.5–8.1)

## 2023-02-01 LAB — CBC WITH DIFFERENTIAL/PLATELET
Abs Immature Granulocytes: 0.04 10*3/uL (ref 0.00–0.07)
Basophils Absolute: 0 10*3/uL (ref 0.0–0.1)
Basophils Relative: 0 %
Eosinophils Absolute: 0.1 10*3/uL (ref 0.0–0.5)
Eosinophils Relative: 2 %
HCT: 36.4 % — ABNORMAL LOW (ref 39.0–52.0)
Hemoglobin: 12.7 g/dL — ABNORMAL LOW (ref 13.0–17.0)
Immature Granulocytes: 1 %
Lymphocytes Relative: 23 %
Lymphs Abs: 1.3 10*3/uL (ref 0.7–4.0)
MCH: 35.8 pg — ABNORMAL HIGH (ref 26.0–34.0)
MCHC: 34.9 g/dL (ref 30.0–36.0)
MCV: 102.5 fL — ABNORMAL HIGH (ref 80.0–100.0)
Monocytes Absolute: 1 10*3/uL (ref 0.1–1.0)
Monocytes Relative: 16 %
Neutro Abs: 3.4 10*3/uL (ref 1.7–7.7)
Neutrophils Relative %: 58 %
Platelets: 125 10*3/uL — ABNORMAL LOW (ref 150–400)
RBC: 3.55 MIL/uL — ABNORMAL LOW (ref 4.22–5.81)
RDW: 15.9 % — ABNORMAL HIGH (ref 11.5–15.5)
WBC: 5.8 10*3/uL (ref 4.0–10.5)
nRBC: 0 % (ref 0.0–0.2)

## 2023-02-01 LAB — URINALYSIS, W/ REFLEX TO CULTURE (INFECTION SUSPECTED)
Bacteria, UA: NONE SEEN
Bilirubin Urine: NEGATIVE
Glucose, UA: NEGATIVE mg/dL
Hgb urine dipstick: NEGATIVE
Ketones, ur: NEGATIVE mg/dL
Leukocytes,Ua: NEGATIVE
Nitrite: NEGATIVE
Protein, ur: NEGATIVE mg/dL
Specific Gravity, Urine: 1.008 (ref 1.005–1.030)
pH: 6 (ref 5.0–8.0)

## 2023-02-01 LAB — GLUCOSE, CAPILLARY
Glucose-Capillary: 114 mg/dL — ABNORMAL HIGH (ref 70–99)
Glucose-Capillary: 83 mg/dL (ref 70–99)
Glucose-Capillary: 100 mg/dL — ABNORMAL HIGH (ref 70–99)

## 2023-02-01 MED ORDER — POTASSIUM CHLORIDE CRYS ER 20 MEQ PO TBCR
40.0000 meq | EXTENDED_RELEASE_TABLET | Freq: Once | ORAL | Status: AC
  Administered 2023-02-01: 40 meq via ORAL
  Filled 2023-02-01: qty 2

## 2023-02-01 MED ORDER — SODIUM CHLORIDE 0.9 % IV BOLUS
1000.0000 mL | Freq: Once | INTRAVENOUS | Status: AC
  Administered 2023-02-01: 1000 mL via INTRAVENOUS

## 2023-02-01 NOTE — Progress Notes (Signed)
Precedex gtt stopped at 0755. Patient awake, alert, able to sit on the side of the bed and eat breakfast.

## 2023-02-01 NOTE — Plan of Care (Signed)
  Problem: Acute Rehab PT Goals(only PT should resolve) Goal: Pt Will Go Supine/Side To Sit Outcome: Progressing Flowsheets (Taken 02/01/2023 1303) Pt will go Supine/Side to Sit: with modified independence Goal: Patient Will Transfer Sit To/From Stand Outcome: Progressing Flowsheets (Taken 02/01/2023 1303) Patient will transfer sit to/from stand: with modified independence Goal: Pt Will Transfer Bed To Chair/Chair To Bed Outcome: Progressing Flowsheets (Taken 02/01/2023 1303) Pt will Transfer Bed to Chair/Chair to Bed: with modified independence Goal: Pt Will Ambulate Outcome: Progressing Flowsheets (Taken 02/01/2023 1303) Pt will Ambulate:  75 feet  with min guard assist  with rolling walker   1:03 PM, 02/01/23 Ocie Bob, MPT Physical Therapist with [facility] 336 [phone] office [phone] mobile phone

## 2023-02-01 NOTE — Progress Notes (Signed)
PROGRESS NOTE  Connor Copeland:[id number] DOB: 04-06-1976 DOA: 01/28/2023 PCP: Benita Stabile, MD  Brief History:  47 year old male long time heavy alcohol abuse (at least one fifth of liquor daily), chronic painful peripheral neuropathy, gout, tobacco abuser (2 ppd), hyperlipidemia, hypertriglyceridemia, FH CAD has been having increasing symptoms of painful neuropathy that has progressed over the past 2 months.  He has been evaluated by primary care and neurology.  He recently saw neurology and was started on lamictal rapid taper up. Pt reports he stopped taking it.  He finally decided that alcohol was the cause of his painful feet and legs and he stopped alcohol completely 2 days PTA.   His mother reported that he has not been eating or drinking fluids in the past couple of days.  She reported that he was in alcohol rehab about 10 years ago but has been drinking consistently heavily every day for past 10 years.  She came home to witness patient sitting in a recliner having a tonic clonic seizure.  He bit his tongue and lost control of his bowel and bladder.  [facility] was called and brought him into the ED. Seizure had stopped by the time he arrived, but pt was post-ictal and lethargic.  His BS was normal.    He was evaluated in ED and found to have normal head CT and CT cervical spine.  He had evidence of tongue lacerations and was noted to have hyponatremia and mild LFT elevation.  He is being admitted to stepdown ICU for acute alcohol withdrawal.   He remained agitated on CIWA ativan.  He was started on precedex on 01/29/23    Assessment/Plan:  Acute Alcohol Withdrawal / Alcohol withdrawal seizure  - Pt stopped alcohol completely 2 days PTA after drinking heavily everyday for at least 10 years - tonic clonic seizure witnessed by his mother  - remains agitated and combative on CIWA Ativan - continue precedex>>weaned off 6/29 at 0720 - continue IV Ativan prn - continue IVF -  urine toxicology screen>>positive opiates - seizure precautions ordered  - when medically stabilized TOC consultation for alcohol treatment program options   Tongue Laceration  - ordered peridex mouth rinse three times daily x 4 days   Tobacco abuser - smokes at least 2 ppd per mother - nicotine patch 21 mg daily ordered, could add an additional patch 14 or 21 mg if needed - counseled on tobacco cessation but he does not seem ready at this time   painful peripheral polyneuropathy - secondary to chronic daily alcohol abuse - he was recently started on lamictal by neurology but stopped taking it - we did not restart the medication  - resumed home hydrocodone/APAP started by PCP last month - mother reports that he had been taking them frequently at home contrary to the prescription directions - PDMP reviewed--Norco 7.5/325, #90, last refill 01/20/23 -Ativan 0.5mg , #30, last refill 12/27/22   Hypokalemia -repleting -added KCl to IVF -check mag   Hyponatremia  - secondary to poor oral intake  - hydrating with IV fluid--improving   Elevated transaminases - he had normal LFTs 01/10/23 - trending down - mother also reported that patient was taking hydrocodone/APAP tabs much more frequently than prescribed   Hyperammonemia -level 54 -clinical significance unclear presently -monitor mentation as sedation is weaned   Thrombocytopenia -due to Etoh -monitor for signs of bleeding -now overall stable       Family  Communication:   mother at bedside 6/28   Consultants:  none   Code Status:  FULL    DVT Prophylaxis:[place] Lovenox     Subjective: Patient denies fevers, chills, headache, chest pain, dyspnea, nausea, vomiting, diarrhea, abdominal pain, dysuria, hematuria, hematochezia, and melena.   Objective: Vitals:   02/01/23 1330 02/01/23 1630 02/01/23 1730 02/01/23 1800  BP: 91/64 (!) 118/97 93/75 111/71  Pulse:  98 (!) 128 97  Resp: 17 16 14    Temp:  98.3 F (36.8 C)     TempSrc:  Oral    SpO2:  100% 98% 100%  Weight:      Height:        Intake/Output Summary (Last 24 hours) at 02/01/2023 1832 Last data filed at 02/01/2023 1823 Gross per 24 hour  Intake 948.33 ml  Output 1300 ml  Net -351.67 ml   Weight change: 0.3 kg Exam:  General:  Pt is alert, follows commands appropriately, not in acute distress HEENT: No icterus, No thrush, No neck mass, [place]/AT Cardiovascular: RRR, S1/S2, no rubs, no gallops Respiratory: CTA bilaterally, no wheezing, no crackles, no rhonchi Abdomen: Soft/+BS, non tender, non distended, no guarding Extremities: No edema, No lymphangitis, No petechiae, No rashes, no synovitis   Data Reviewed: I have personally reviewed following labs and imaging studies Basic Metabolic Panel: Recent Labs  Lab 01/28/23 0940 01/28/23 1221 01/29/23 0423 01/30/23 0425 01/31/23 0415 02/01/23 0427  NA 130*  --  130* 132* 132* 132*  K 3.5  --  2.8* 3.4* 3.6 3.3*  CL 92*  --  95* 101 102 101  CO2 22  --  24 21* 21* 23  GLUCOSE 97  --  71 80 94 95  BUN 7  --  5* 9 9 7   CREATININE 0.80 0.58* 0.56* 0.54* 0.63 0.60*  CALCIUM 7.4*  --  7.2* 7.4* 7.3* 7.3*  MG  --  2.1 1.9 1.9 1.6*  --   PHOS  --   --  2.7 3.4  --   --    Liver Function Tests: Recent Labs  Lab 01/28/23 0940 01/29/23 0423 01/30/23 0425 01/31/23 0415 02/01/23 0427  AST 99* 74* 65* 59* 42*  ALT 54* 44 41 40 38  ALKPHOS 84 74 79 80 83  BILITOT 1.9* 1.6* 1.7* 1.4* 0.9  PROT 5.2* 4.7* 4.7* 4.8* 4.9*  ALBUMIN 2.8* 2.5* 2.3* 2.3* 2.2*   No results for input(s): "LIPASE", "AMYLASE" in the last 168 hours. Recent Labs  Lab 01/30/23 0425  AMMONIA 54*   Coagulation Profile: No results for input(s): "INR", "PROTIME" in the last 168 hours. CBC: Recent Labs  Lab 01/28/23 0940 01/28/23 1221 01/29/23 0423 01/30/23 0425 01/31/23 0415 02/01/23 0427  WBC 7.4 6.6 6.1 4.9 6.6 5.8  NEUTROABS 5.5  --  4.1 3.4 4.5 3.4  HGB 13.6 13.0 13.2 13.2 12.9* 12.7*  HCT 39.4 37.1*  37.8* 37.3* 37.5* 36.4*  MCV 102.3* 100.3* 101.1* 101.9* 101.6* 102.5*  PLT 135* 113* 106* 114* 100* 125*   Cardiac Enzymes: Recent Labs  Lab 01/28/23 0940  CKTOTAL 192   BNP: Invalid input(s): "POCBNP" CBG: Recent Labs  Lab 01/31/23 1940 01/31/23 2305 02/01/23 0410 02/01/23 0739 02/01/23 1136  GLUCAP 93 73 114* 83 100*   HbA1C: No results for input(s): "HGBA1C" in the last 72 hours. Urine analysis:    Component Value Date/Time   COLORURINE YELLOW 01/10/2023 1019   APPEARANCEUR CLEAR 01/10/2023 1019   LABSPEC 1.013 01/10/2023 1019   PHURINE 5.5  01/10/2023 1019   GLUCOSEU NEGATIVE 01/10/2023 1019   HGBUR NEGATIVE 01/10/2023 1019   BILIRUBINUR NEGATIVE 01/10/2023 1019   KETONESUR NEGATIVE 01/10/2023 1019   PROTEINUR NEGATIVE 01/10/2023 1019   NITRITE NEGATIVE 01/10/2023 1019   LEUKOCYTESUR NEGATIVE 01/10/2023 1019   Sepsis Labs: @LABRCNTIP (procalcitonin:4,lacticidven:4) ) Recent Results (from the past 240 hour(s))  MRSA Next Gen by PCR, Nasal     Status: None   Collection Time: 01/28/23 12:45 PM   Specimen: Nasal Mucosa; Nasal Swab  Result Value Ref Range Status   MRSA by PCR Next Gen NOT DETECTED NOT DETECTED Final    Comment: (NOTE) The GeneXpert MRSA Assay (FDA approved for NASAL specimens only), is one component of a comprehensive MRSA colonization surveillance program. It is not intended to diagnose MRSA infection nor to guide or monitor treatment for MRSA infections. Test performance is not FDA approved in patients less than 49 years old. Performed at [facility], [address] [place], [place] [address]      Scheduled Meds:  chlordiazePOXIDE  25 mg Oral TID   Chlorhexidine Gluconate Cloth  6 each Topical Daily   enoxaparin (LOVENOX) injection  40 mg Subcutaneous Q24H   folic acid  1 mg Intravenous Daily   multivitamin with minerals  1 tablet Oral Daily   nicotine  21 mg Transdermal Daily   mouth rinse  15 mL Mouth Rinse 4 times per day    pantoprazole (PROTONIX) IV  40 mg Intravenous Q24H   thiamine  100 mg Oral Daily   Or   thiamine  100 mg Intravenous Daily   Continuous Infusions:  dexmedetomidine (PRECEDEX) IV infusion Stopped (02/01/23 0755)    Procedures/Studies: CT Cervical Spine Wo Contrast  Result Date: 01/28/2023 CLINICAL DATA:  Polytrauma, blunt EXAM: CT CERVICAL SPINE WITHOUT CONTRAST TECHNIQUE: Multidetector CT imaging of the cervical spine was performed without intravenous contrast. Multiplanar CT image reconstructions were also generated. RADIATION DOSE REDUCTION: This exam was performed according to the departmental dose-optimization program which includes automated exposure control, adjustment of the mA and/or kV according to patient size and/or use of iterative reconstruction technique. COMPARISON:  None Available. FINDINGS: Alignment: Normal Skull base and vertebrae: No acute fracture. No primary bone lesion or focal pathologic process. Soft tissues and spinal canal: No prevertebral fluid or swelling. No visible canal hematoma. Disc levels:  Disc space narrowing and early spurring at C6-7. Upper chest: No acute findings Other: None IMPRESSION: No acute bony abnormality. Electronically Signed   By: Charlett Nose M.D.   On: 01/28/2023 10:09   CT Head Wo Contrast  Result Date: 01/28/2023 CLINICAL DATA:  Seizure, new-onset, history of trauma EXAM: CT HEAD WITHOUT CONTRAST TECHNIQUE: Contiguous axial images were obtained from the base of the skull through the vertex without intravenous contrast. RADIATION DOSE REDUCTION: This exam was performed according to the departmental dose-optimization program which includes automated exposure control, adjustment of the mA and/or kV according to patient size and/or use of iterative reconstruction technique. COMPARISON:  None Available. FINDINGS: Brain: No acute intracranial abnormality. Specifically, no hemorrhage, hydrocephalus, mass lesion, acute infarction, or significant  intracranial injury. Vascular: No hyperdense vessel or unexpected calcification. Skull: No acute calvarial abnormality. Sinuses/Orbits: No acute findings Other: None IMPRESSION: Normal study. Electronically Signed   By: Charlett Nose M.D.   On: 01/28/2023 10:03   DG Chest Port 1 View  Result Date: 01/28/2023 CLINICAL DATA:  47 year old male status post seizure. EXAM: PORTABLE CHEST 1 VIEW COMPARISON:  Chest radiographs 01/10/2023 and earlier. FINDINGS:  Portable AP upright view at 0936 hours. Improved lung volumes. Normal cardiac size and mediastinal contours. Visualized tracheal air column is within normal limits. Allowing for portable technique the lungs are clear. No pneumothorax or pleural effusion. No acute osseous abnormality identified. Paucity of visible bowel gas. IMPRESSION: Negative portable chest. Electronically Signed   By: Odessa Fleming M.D.   On: 01/28/2023 09:47   ECHOCARDIOGRAM COMPLETE  Result Date: 01/22/2023    ECHOCARDIOGRAM REPORT   Patient Name:   Connor Copeland Date of Exam: 01/22/2023 Medical Rec #:  [id number]              Height:       74.0 in Accession #:    [id number]             Weight:       228.0 lb Date of Birth:  August 13, 1975              BSA:          2.298 m Patient Age:    46 years               BP:           114/83 mmHg Patient Gender: M                      HR:           103 bpm. [name] Procedure: 2D Echo, Cardiac Doppler and Color Doppler Indications:    Leg swelling  History:        Patient has no prior history of Echocardiogram examinations.                 Risk Factors:Dyslipidemia. Leg swelling.  Sonographer:    Mikki Harbor Referring Phys: [id number] VISHNU P MALLIPEDDI  Sonographer Comments: Patient is obese. IMPRESSIONS  1. Left ventricular ejection fraction, by estimation, is 65 to 70%. The left ventricle has normal function. The left ventricle has no regional wall motion abnormalities. There is mild concentric left ventricular hypertrophy.  Left ventricular diastolic parameters are consistent with Grade I diastolic dysfunction (impaired relaxation).  2. Right ventricular systolic function is normal. The right ventricular size is normal. Tricuspid regurgitation signal is inadequate for assessing PA pressure.  3. The mitral valve is grossly normal. Trivial mitral valve regurgitation.  4. The aortic valve was not well visualized, likely tricuspid. Aortic valve regurgitation is not visualized. No aortic stenosis is present. Aortic valve mean gradient measures 3.0 mmHg.  5. Aortic dilatation noted. There is mild dilatation of the aortic root, measuring 41 mm.  6. The inferior vena cava is normal in size with greater than 50% respiratory variability, suggesting right atrial pressure of 3 mmHg. Comparison(s): No prior Echocardiogram. FINDINGS  Left Ventricle: Left ventricular ejection fraction, by estimation, is 65 to 70%. The left ventricle has normal function. The left ventricle has no regional wall motion abnormalities. The left ventricular internal cavity size was normal in size. There is  mild concentric left ventricular hypertrophy. Left ventricular diastolic parameters are consistent with Grade I diastolic dysfunction (impaired relaxation). Right Ventricle: The right ventricular size is normal. No increase in right ventricular wall thickness. Right ventricular systolic function is normal. Tricuspid regurgitation signal is inadequate for assessing PA pressure. Left Atrium: Left atrial size was normal in size. Right Atrium: Right atrial size was normal in size. Pericardium: There is no evidence of pericardial effusion. Presence of epicardial fat layer. Mitral Valve:  The mitral valve is grossly normal. Trivial mitral valve regurgitation. MV peak gradient, 3.7 mmHg. The mean mitral valve gradient is 1.5 mmHg. Tricuspid Valve: The tricuspid valve is grossly normal. Tricuspid valve regurgitation is trivial. Aortic Valve: The aortic valve was not well  visualized. There is mild aortic valve annular calcification. Aortic valve regurgitation is not visualized. No aortic stenosis is present. Aortic valve mean gradient measures 3.0 mmHg. Aortic valve peak gradient measures 6.9 mmHg. Aortic valve area, by VTI measures 2.78 cm. Pulmonic Valve: The pulmonic valve was grossly normal. Pulmonic valve regurgitation is trivial. Aorta: Aortic dilatation noted. There is mild dilatation of the aortic root, measuring 41 mm. Venous: The inferior vena cava is normal in size with greater than 50% respiratory variability, suggesting right atrial pressure of 3 mmHg. IAS/Shunts: No atrial level shunt detected by color flow Doppler.  LEFT VENTRICLE PLAX 2D LVIDd:         4.20 cm   Diastology LVIDs:         2.60 cm   LV e' medial:    7.62 cm/s LV PW:         1.10 cm   LV E/e' medial:  7.0 LV IVS:        1.30 cm   LV e' lateral:   10.10 cm/s LVOT diam:     2.10 cm   LV E/e' lateral: 5.3 LV SV:         62 LV SV Index:   27 LVOT Area:     3.46 cm  RIGHT VENTRICLE RV Basal diam:  3.00 cm RV Mid diam:    2.70 cm RV S prime:     13.80 cm/s TAPSE (M-mode): 2.6 cm LEFT ATRIUM             Index LA diam:        3.40 cm 1.48 cm/m LA Vol (A2C):   36.2 ml 15.75 ml/m LA Vol (A4C):   42.7 ml 18.58 ml/m LA Biplane Vol: 39.7 ml 17.28 ml/m  AORTIC VALVE AV Area (Vmax):    2.31 cm AV Area (Vmean):   2.59 cm AV Area (VTI):     2.78 cm AV Vmax:           131.00 cm/s AV Vmean:          75.300 cm/s AV VTI:            0.222 m AV Peak Grad:      6.9 mmHg AV Mean Grad:      3.0 mmHg LVOT Vmax:         87.20 cm/s LVOT Vmean:        56.300 cm/s LVOT VTI:          0.178 m LVOT/AV VTI ratio: 0.80  AORTA Ao Root diam: 4.10 cm Ao Arch diam: 3.7 cm MITRAL VALVE MV Area (PHT): 5.62 cm    SHUNTS MV Area VTI:   3.38 cm    Systemic VTI:  0.18 m MV Peak grad:  3.7 mmHg    Systemic Diam: 2.10 cm MV Mean grad:  1.5 mmHg MV Vmax:       0.96 m/s MV Vmean:      57.0 cm/s MV Decel Time: 135 msec MV E velocity: 53.50  cm/s MV A velocity: 85.00 cm/s MV E/A ratio:  0.63 Nona Dell MD Electronically signed by Nona Dell MD Signature Date/Time: 01/22/2023/2:06:29 PM    Final    DG Chest 2 View  Result Date:  01/10/2023 CLINICAL DATA:  Thrush, chest pain. EXAM: CHEST - 2 VIEW COMPARISON:  12/12/2022. FINDINGS: Low lung volumes accentuate the pulmonary vasculature and cardiomediastinal silhouette. No consolidation pulmonary edema. No pleural effusion or pneumothorax. Visualized bones and upper abdomen are unremarkable. IMPRESSION: No evidence of acute cardiopulmonary disease. Electronically Signed   By: Orvan Falconer M.D.   On: 01/10/2023 11:16    Catarina Hartshorn, DO  [facility]  If 7PM-7AM, please contact night-coverage [email or web address] Password [facility] 02/01/2023, 6:32 PM   LOS: 4 days

## 2023-02-01 NOTE — Evaluation (Signed)
Physical Therapy Evaluation Patient Details Name: Connor Copeland MRN: [id number] DOB: 1976/02/01 Today's Date: 02/01/2023  History of Present Illness  Connor Copeland is a 47 year old male long time heavy alcohol abuse (at least one fifth of liquor daily), chronic painful peripheral neuropathy, gout, tobacco abuser (2 ppd), hyperlipidemia, hypertriglyceridemia, FH CAD has been having increasing symptoms of painful neuropathy that has progressed over the past 2 months.  He has been evaluated by primary care and neurology.  He recently saw neurology and was started on lamictal rapid taper up. Pt reports he stopped taking it.  He finally decided that alcohol was the cause of his painful feet and legs and he stopped alcohol completely 2 days ago.       His mother reported that he has not been eating or drinking fluids in the past couple of days.  She reported that he was in alcohol rehab about 10 years ago but has been drinking consistently heavily every day for past 10 years.  She came home to witness patient sitting in a recliner having a tonic clonic seizure.  He bit his tongue and lost control of his bowel and bladder. [facility] was called and brought him into the ED. Seizure had stopped by the time he arrived.  His BS was normal.   Clinical Impression  Patient demonstrates slow labored movement for sitting up at bedside, unable to stand without AD due to BLE weakness, required use of RW for safety, demonstrated slow labored cadence during gait training with most c/o pain in BLE, some in back and arms and limited mostly due to c/o fatigue.  Patient tolerated sitting up in chair with family members present after therapy.  Patient will benefit from continued skilled physical therapy in hospital and recommended venue below to increase strength, balance, endurance for safe ADLs and gait.          Recommendations for follow up therapy are one component of a multi-disciplinary discharge planning  process, led by the attending physician.  Recommendations may be updated based on patient status, additional functional criteria and insurance authorization.  Follow Up Recommendations Can patient physically be transported by private vehicle: Yes     Assistance Recommended at Discharge Set up Supervision/Assistance  Patient can return home with the following  A little help with walking and/or transfers;A little help with bathing/dressing/bathroom;Help with stairs or ramp for entrance;Assistance with cooking/housework    Equipment Recommendations Rolling walker (2 wheels)  Recommendations for Other Services       Functional Status Assessment Patient has had a recent decline in their functional status and demonstrates the ability to make significant improvements in function in a reasonable and predictable amount of time.     Precautions / Restrictions Precautions Precautions: Fall Restrictions Weight Bearing Restrictions: No      Mobility  Bed Mobility Overal bed mobility: Needs Assistance Bed Mobility: Rolling, Supine to Sit Rolling: Min guard   Supine to sit: Min guard     General bed mobility comments: increased time, labored movement    Transfers Overall transfer level: Needs assistance Equipment used: Rolling walker (2 wheels) Transfers: Sit to/from Stand, Bed to chair/wheelchair/BSC Sit to Stand: Min assist   Step pivot transfers: Min assist       General transfer comment: unable to stand without AD due to weakness, required use of RW for safety    Ambulation/Gait Ambulation/Gait assistance: Min assist Gait Distance (Feet): 35 Feet Assistive device: Rolling walker (2 wheels) Gait Pattern/deviations: Decreased  step length - right, Decreased step length - left, Decreased stride length Gait velocity: decreased     General Gait Details: slow labored unsteady cadence without loss of balance and limited mostly due to fatigue  Stairs             Wheelchair Mobility    Modified Rankin (Stroke Patients Only)       Balance Overall balance assessment: Needs assistance Sitting-balance support: Feet supported, No upper extremity supported Sitting balance-Leahy Scale: Fair Sitting balance - Comments: fair/good seated at EOB   Standing balance support: During functional activity, No upper extremity supported Standing balance-Leahy Scale: Poor Standing balance comment: fair using RW                             Pertinent Vitals/Pain Pain Assessment Pain Assessment: Faces Faces Pain Scale: Hurts little more Pain Location: mostly BLE, some in back and arms Pain Descriptors / Indicators: Sore, Discomfort Pain Intervention(s): Limited activity within patient's tolerance, Monitored during session, Repositioned    Home Living Family/patient expects to be discharged to:: Private residence Living Arrangements: Parent Available Help at Discharge: Family;Available PRN/intermittently Type of Home: House Home Access: Stairs to enter Entrance Stairs-Rails: Right;Left;Can reach both Entrance Stairs-Number of Steps: 4 Alternate Level Stairs-Number of Steps: 14 Home Layout: Two level Home Equipment: None      Prior Function Prior Level of Function : Independent/Modified Independent             Mobility Comments: Community ambulator without AD, drives ADLs Comments: Independent     Hand Dominance        Extremity/Trunk Assessment   Upper Extremity Assessment Upper Extremity Assessment: Generalized weakness    Lower Extremity Assessment Lower Extremity Assessment: Generalized weakness    Cervical / Trunk Assessment Cervical / Trunk Assessment: Normal  Communication   Communication: No difficulties  Cognition Arousal/Alertness: Awake/alert Behavior During Therapy: WFL for tasks assessed/performed Overall Cognitive Status: Within Functional Limits for tasks assessed                                           General Comments      Exercises     Assessment/Plan    PT Assessment Patient needs continued PT services  PT Problem List Decreased strength;Decreased activity tolerance;Decreased balance;Decreased mobility       PT Treatment Interventions DME instruction;Gait training;Stair training;Functional mobility training;Therapeutic activities;Therapeutic exercise;Patient/family education;Balance training    PT Goals (Current goals can be found in the Care Plan section)  Acute Rehab PT Goals Patient Stated Goal: return home after rehab PT Goal Formulation: With patient/family Time For Goal Achievement: 02/15/23 Potential to Achieve Goals: Good    Frequency Min 3X/week     Co-evaluation               AM-PAC PT "6 Clicks" Mobility  Outcome Measure Help needed turning from your back to your side while in a flat bed without using bedrails?: None Help needed moving from lying on your back to sitting on the side of a flat bed without using bedrails?: A Little Help needed moving to and from a bed to a chair (including a wheelchair)?: A Little Help needed standing up from a chair using your arms (e.g., wheelchair or bedside chair)?: A Little Help needed to walk in hospital room?: A Little Help needed climbing  3-5 steps with a railing? : A Lot 6 Click Score: 18    End of Session   Activity Tolerance: Patient tolerated treatment well;Patient limited by fatigue Patient left: in chair;with call bell/phone within reach;with family/visitor present Nurse Communication: Mobility status PT Visit Diagnosis: Unsteadiness on feet (R26.81);Other abnormalities of gait and mobility (R26.89);Muscle weakness (generalized) (M62.81)    Time: 5409-8119 PT Time Calculation (min) (ACUTE ONLY): 31 min   Charges:   PT Evaluation $PT Eval Moderate Complexity: 1 Mod PT Treatments $Therapeutic Activity: 23-37 mins        1:01 PM, 02/01/23 Ocie Bob, MPT Physical  Therapist with [facility] [phone] office [phone] mobile phone

## 2023-02-02 ENCOUNTER — Inpatient Hospital Stay (HOSPITAL_COMMUNITY): Payer: 59

## 2023-02-02 DIAGNOSIS — R569 Unspecified convulsions: Secondary | ICD-10-CM | POA: Diagnosis not present

## 2023-02-02 DIAGNOSIS — R7401 Elevation of levels of liver transaminase levels: Secondary | ICD-10-CM | POA: Diagnosis not present

## 2023-02-02 DIAGNOSIS — F10931 Alcohol use, unspecified with withdrawal delirium: Secondary | ICD-10-CM | POA: Diagnosis not present

## 2023-02-02 LAB — COMPREHENSIVE METABOLIC PANEL
ALT: 36 U/L (ref 0–44)
AST: 34 U/L (ref 15–41)
Albumin: 2.1 g/dL — ABNORMAL LOW (ref 3.5–5.0)
Alkaline Phosphatase: 80 U/L (ref 38–126)
Anion gap: 8 (ref 5–15)
BUN: 6 mg/dL (ref 6–20)
CO2: 22 mmol/L (ref 22–32)
Calcium: 7.5 mg/dL — ABNORMAL LOW (ref 8.9–10.3)
Chloride: 105 mmol/L (ref 98–111)
Creatinine, Ser: 0.57 mg/dL — ABNORMAL LOW (ref 0.61–1.24)
GFR, Estimated: >60 mL/min (ref >60)
Glucose, Bld: 90 mg/dL (ref 70–99)
Potassium: 3.3 mmol/L — ABNORMAL LOW (ref 3.5–5.1)
Sodium: 135 mmol/L (ref 135–145)
Total Bilirubin: 0.9 mg/dL (ref 0.3–1.2)
Total Protein: 4.7 g/dL — ABNORMAL LOW (ref 6.5–8.1)

## 2023-02-02 LAB — MAGNESIUM: Magnesium: 1.7 mg/dL (ref 1.7–2.4)

## 2023-02-02 LAB — PROCALCITONIN: Procalcitonin: 0.62 ng/mL

## 2023-02-02 MED ORDER — MAGNESIUM OXIDE -MG SUPPLEMENT 400 (240 MG) MG PO TABS
400.0000 mg | ORAL_TABLET | Freq: Two times a day (BID) | ORAL | Status: DC
  Administered 2023-02-02 – 2023-02-03 (×3): 400 mg via ORAL
  Filled 2023-02-02 (×3): qty 1

## 2023-02-02 MED ORDER — POTASSIUM CHLORIDE CRYS ER 20 MEQ PO TBCR
40.0000 meq | EXTENDED_RELEASE_TABLET | Freq: Once | ORAL | Status: AC
  Administered 2023-02-02: 40 meq via ORAL
  Filled 2023-02-02: qty 2

## 2023-02-02 MED ORDER — PANTOPRAZOLE SODIUM 40 MG PO TBEC
40.0000 mg | DELAYED_RELEASE_TABLET | Freq: Every day | ORAL | Status: DC
  Administered 2023-02-03: 40 mg via ORAL
  Filled 2023-02-02 (×2): qty 1

## 2023-02-02 MED ORDER — FOLIC ACID 1 MG PO TABS
1.0000 mg | ORAL_TABLET | Freq: Every day | ORAL | Status: DC
  Administered 2023-02-02 – 2023-02-03 (×2): 1 mg via ORAL
  Filled 2023-02-02 (×2): qty 1

## 2023-02-02 MED ORDER — LAMOTRIGINE 25 MG PO TABS
25.0000 mg | ORAL_TABLET | Freq: Every day | ORAL | Status: DC
  Administered 2023-02-02 – 2023-02-03 (×2): 25 mg via ORAL
  Filled 2023-02-02 (×2): qty 1

## 2023-02-02 MED ORDER — OXYCODONE-ACETAMINOPHEN 5-325 MG PO TABS
1.0000 | ORAL_TABLET | ORAL | Status: DC | PRN
  Administered 2023-02-02 – 2023-02-03 (×6): 1 via ORAL
  Filled 2023-02-02 (×6): qty 1

## 2023-02-02 NOTE — TOC Progression Note (Addendum)
Transition of Care [facility]) - Progression Note    Patient Details  Name: CODEY TRICKEY MRN: [id number] Date of Birth: [birth year]  Transition of Care [facility]) CM/SW Contact  Carmina Miller, LCSWA Phone Number: 02/02/2023, 12:48 PM  Clinical Narrative:     PT recs SNF, mom wants SNF, pt still on CIWA protocol and precedex, TOC will attempt to work pt up when closer to being medically stable and off of CIWA.   CSW spoke with pt's mom via phone, she expressed fear of bringing pt home as he is unable to walk and she can't care for him in his current state. CSW explained potential barrier to SNF placement, mom verbalized understanding but still continues to state she has no one at home to help care for pt and would like to exhaust all options for pt to build his strength up. TOC will continue to follow.        Expected Discharge Plan and Services                                               Social Determinants of Health (SDOH) Interventions SDOH Screenings   Food Insecurity: No Food Insecurity (01/28/2023)  Housing: Low Risk  (01/28/2023)  Transportation Needs: No Transportation Needs (01/28/2023)  Utilities: Not At Risk (01/28/2023)  Tobacco Use: High Risk (01/28/2023)    Readmission Risk Interventions     No data to display

## 2023-02-02 NOTE — Plan of Care (Signed)

## 2023-02-02 NOTE — Progress Notes (Addendum)
PROGRESS NOTE  JOSEMARIA TOLMAN Copeland:[id number] DOB: August 26, 1975 DOA: 01/28/2023 PCP: Benita Stabile, MD  Brief History:  47 year old male long time heavy alcohol abuse (at least one fifth of liquor daily), chronic painful peripheral neuropathy, gout, tobacco abuser (2 ppd), hyperlipidemia, hypertriglyceridemia, FH CAD has been having increasing symptoms of painful neuropathy that has progressed over the past 2 months.  He has been evaluated by primary care and neurology.  He recently saw neurology and was started on lamictal rapid taper up. Pt reports he stopped taking it.  He finally decided that alcohol was the cause of his painful feet and legs and he stopped alcohol completely 2 days PTA.   His mother reported that he has not been eating or drinking fluids in the past couple of days.  She reported that he was in alcohol rehab about 10 years ago but has been drinking consistently heavily every day for past 10 years.  She came home to witness patient sitting in a recliner having a tonic clonic seizure.  He bit his tongue and lost control of his bowel and bladder.  [facility] was called and brought him into the ED. Seizure had stopped by the time he arrived, but pt was post-ictal and lethargic.  His BS was normal.    He was evaluated in ED and found to have normal head CT and CT cervical spine.  He had evidence of tongue lacerations and was noted to have hyponatremia and mild LFT elevation.  He is being admitted to stepdown ICU for acute alcohol withdrawal.   He remained agitated on CIWA ativan.  He was started on precedex on 01/29/23    Assessment/Plan:  Acute Alcohol Withdrawal / Alcohol withdrawal seizure  - Pt stopped alcohol completely 2 days PTA after drinking heavily everyday for at least 10 years - tonic clonic seizure witnessed by his mother  - was agitated and combative on CIWA Ativan>>precedex started - continue precedex>>weaned off 6/29 at 0720 - 6/30--intermittent confusion  overnight, but calm and A&O x 4 in am - continue IVF>>saline locked - urine toxicology screen>>positive opiates - seizure precautions ordered  - when medically stabilized TOC consultation for alcohol treatment program options -UA--neg for pyuria   Tongue Laceration  - ordered peridex mouth rinse three times daily x 4 days   Tobacco abuser - smokes at least 2 ppd per mother - nicotine patch 21 mg daily ordered, could add an additional patch 14 or 21 mg if needed - counseled on tobacco cessation but he does not seem ready at this time   painful peripheral polyneuropathy - secondary to chronic daily alcohol abuse - he was recently started on lamictal by neurology but stopped taking it>>restart 25 mg daily - we did not restart the medication  - resumed home hydrocodone/APAP started by PCP last month but pt continued to complain of poor pain control>>percocet - mother reports that he had been taking them frequently at home contrary to the prescription directions - PDMP reviewed--Norco 7.5/325, #90, last refill 01/20/23 -Ativan 0.5mg , #30, last refill 12/27/22   Hypokalemia -repleting -added KCl to IVF -check mag--1.7   Hyponatremia  - secondary to poor oral intake  - hydrating with IV fluid--improving   Elevated transaminases - he had normal LFTs 01/10/23 - trending down>>improved - mother also reported that patient was taking hydrocodone/APAP tabs much more frequently than prescribed   Hyperammonemia -level 54 -clinical significance unclear presently -monitor mentation as  sedation is weaned   Thrombocytopenia -due to Etoh -monitor for signs of bleeding -now overall stable       Family Communication:   mother at bedside 6/28   Consultants:  none   Code Status:  FULL    DVT Prophylaxis:[place] Lovenox            Subjective: Pt complains of pain in his legs.  Denies f/c, cp, sob, n/v/d, abd pain  Objective: Vitals:   02/01/23 2030 02/02/23 0051 02/02/23 0444  02/02/23 0742  BP: 117/84 116/70 121/87 119/86  Pulse: 94  91 (!) 107  Resp: 19 18 19 20   Temp: 98.7 F (37.1 C) 100.2 F (37.9 C) 98.7 F (37.1 C) 98.9 F (37.2 C)  TempSrc:  Oral Oral Oral  SpO2: 100% 98% 100% 99%  Weight:      Height:        Intake/Output Summary (Last 24 hours) at 02/02/2023 0904 Last data filed at 02/02/2023 0214 Gross per 24 hour  Intake 905.03 ml  Output 1250 ml  Net -344.97 ml   Weight change:  Exam:  General:  Pt is alert, follows commands appropriately, not in acute distress HEENT: No icterus, No thrush, No neck mass, [employer]/AT Cardiovascular: RRR, S1/S2, no rubs, no gallops Respiratory: CTA bilaterally, no wheezing, no crackles, no rhonchi Abdomen: Soft/+BS, non tender, non distended, no guarding Extremities: 1 + LE edema, No lymphangitis, No petechiae, No rashes, no synovitis Neuro:  CN II-XII intact, strength 4/5 in RUE, RLE, strength 4/5 LUE, LLE; sensation intact bilateral; no dysmetria; babinski equivocal    Data Reviewed: I have personally reviewed following labs and imaging studies Basic Metabolic Panel: Recent Labs  Lab 01/28/23 1221 01/29/23 0423 01/30/23 0425 01/31/23 0415 02/01/23 0427 02/02/23 0508  NA  --  130* 132* 132* 132* 135  K  --  2.8* 3.4* 3.6 3.3* 3.3*  CL  --  95* 101 102 101 105  CO2  --  24 21* 21* 23 22  GLUCOSE  --  71 80 94 95 90  BUN  --  5* 9 9 7 6   CREATININE 0.58* 0.56* 0.54* 0.63 0.60* 0.57*  CALCIUM  --  7.2* 7.4* 7.3* 7.3* 7.5*  MG 2.1 1.9 1.9 1.6*  --  1.7  PHOS  --  2.7 3.4  --   --   --    Liver Function Tests: Recent Labs  Lab 01/29/23 0423 01/30/23 0425 01/31/23 0415 02/01/23 0427 02/02/23 0508  AST 74* 65* 59* 42* 34  ALT 44 41 40 38 36  ALKPHOS 74 79 80 83 80  BILITOT 1.6* 1.7* 1.4* 0.9 0.9  PROT 4.7* 4.7* 4.8* 4.9* 4.7*  ALBUMIN 2.5* 2.3* 2.3* 2.2* 2.1*   No results for input(s): "LIPASE", "AMYLASE" in the last 168 hours. Recent Labs  Lab 01/30/23 0425  AMMONIA 54*    Coagulation Profile: No results for input(s): "INR", "PROTIME" in the last 168 hours. CBC: Recent Labs  Lab 01/28/23 0940 01/28/23 1221 01/29/23 0423 01/30/23 0425 01/31/23 0415 02/01/23 0427  WBC 7.4 6.6 6.1 4.9 6.6 5.8  NEUTROABS 5.5  --  4.1 3.4 4.5 3.4  HGB 13.6 13.0 13.2 13.2 12.9* 12.7*  HCT 39.4 37.1* 37.8* 37.3* 37.5* 36.4*  MCV 102.3* 100.3* 101.1* 101.9* 101.6* 102.5*  PLT 135* 113* 106* 114* 100* 125*   Cardiac Enzymes: Recent Labs  Lab 01/28/23 0940  CKTOTAL 192   BNP: Invalid input(s): "POCBNP" CBG: Recent Labs  Lab 01/31/23 1940 01/31/23 2305  02/01/23 0410 02/01/23 0739 02/01/23 1136  GLUCAP 93 73 114* 83 100*   HbA1C: No results for input(s): "HGBA1C" in the last 72 hours. Urine analysis:    Component Value Date/Time   COLORURINE YELLOW 02/01/2023 1951   APPEARANCEUR CLEAR 02/01/2023 1951   LABSPEC 1.008 02/01/2023 1951   PHURINE 6.0 02/01/2023 1951   GLUCOSEU NEGATIVE 02/01/2023 1951   HGBUR NEGATIVE 02/01/2023 1951   BILIRUBINUR NEGATIVE 02/01/2023 1951   KETONESUR NEGATIVE 02/01/2023 1951   PROTEINUR NEGATIVE 02/01/2023 1951   NITRITE NEGATIVE 02/01/2023 1951   LEUKOCYTESUR NEGATIVE 02/01/2023 1951   Sepsis Labs: @LABRCNTIP (procalcitonin:4,lacticidven:4) ) Recent Results (from the past 240 hour(s))  MRSA Next Gen by PCR, Nasal     Status: None   Collection Time: 01/28/23 12:45 PM   Specimen: Nasal Mucosa; Nasal Swab  Result Value Ref Range Status   MRSA by PCR Next Gen NOT DETECTED NOT DETECTED Final    Comment: (NOTE) The GeneXpert MRSA Assay (FDA approved for NASAL specimens only), is one component of a comprehensive MRSA colonization surveillance program. It is not intended to diagnose MRSA infection nor to guide or monitor treatment for MRSA infections. Test performance is not FDA approved in patients less than 25 years old. Performed at [facility], [address] [place], [place] [address]      Scheduled Meds:   chlordiazePOXIDE  25 mg Oral TID   Chlorhexidine Gluconate Cloth  6 each Topical Daily   enoxaparin (LOVENOX) injection  40 mg Subcutaneous Q24H   folic acid  1 mg Intravenous Daily   multivitamin with minerals  1 tablet Oral Daily   nicotine  21 mg Transdermal Daily   mouth rinse  15 mL Mouth Rinse 4 times per day   pantoprazole (PROTONIX) IV  40 mg Intravenous Q24H   thiamine  100 mg Oral Daily   Or   thiamine  100 mg Intravenous Daily   Continuous Infusions:  dexmedetomidine (PRECEDEX) IV infusion Stopped (02/01/23 0755)    Procedures/Studies: CT Cervical Spine Wo Contrast  Result Date: 01/28/2023 CLINICAL DATA:  Polytrauma, blunt EXAM: CT CERVICAL SPINE WITHOUT CONTRAST TECHNIQUE: Multidetector CT imaging of the cervical spine was performed without intravenous contrast. Multiplanar CT image reconstructions were also generated. RADIATION DOSE REDUCTION: This exam was performed according to the departmental dose-optimization program which includes automated exposure control, adjustment of the mA and/or kV according to patient size and/or use of iterative reconstruction technique. COMPARISON:  None Available. FINDINGS: Alignment: Normal Skull base and vertebrae: No acute fracture. No primary bone lesion or focal pathologic process. Soft tissues and spinal canal: No prevertebral fluid or swelling. No visible canal hematoma. Disc levels:  Disc space narrowing and early spurring at C6-7. Upper chest: No acute findings Other: None IMPRESSION: No acute bony abnormality. Electronically Signed   By: Charlett Nose M.D.   On: 01/28/2023 10:09   CT Head Wo Contrast  Result Date: 01/28/2023 CLINICAL DATA:  Seizure, new-onset, history of trauma EXAM: CT HEAD WITHOUT CONTRAST TECHNIQUE: Contiguous axial images were obtained from the base of the skull through the vertex without intravenous contrast. RADIATION DOSE REDUCTION: This exam was performed according to the departmental dose-optimization program  which includes automated exposure control, adjustment of the mA and/or kV according to patient size and/or use of iterative reconstruction technique. COMPARISON:  None Available. FINDINGS: Brain: No acute intracranial abnormality. Specifically, no hemorrhage, hydrocephalus, mass lesion, acute infarction, or significant intracranial injury. Vascular: No hyperdense vessel or unexpected calcification. Skull: No acute calvarial  abnormality. Sinuses/Orbits: No acute findings Other: None IMPRESSION: Normal study. Electronically Signed   By: Charlett Nose M.D.   On: 01/28/2023 10:03   DG Chest Port 1 View  Result Date: 01/28/2023 CLINICAL DATA:  47 year old male status post seizure. EXAM: PORTABLE CHEST 1 VIEW COMPARISON:  Chest radiographs 01/10/2023 and earlier. FINDINGS: Portable AP upright view at 0936 hours. Improved lung volumes. Normal cardiac size and mediastinal contours. Visualized tracheal air column is within normal limits. Allowing for portable technique the lungs are clear. No pneumothorax or pleural effusion. No acute osseous abnormality identified. Paucity of visible bowel gas. IMPRESSION: Negative portable chest. Electronically Signed   By: Odessa Fleming M.D.   On: 01/28/2023 09:47   ECHOCARDIOGRAM COMPLETE  Result Date: 01/22/2023    ECHOCARDIOGRAM REPORT   Patient Name:   Connor Copeland Date of Exam: 01/22/2023 Medical Rec #:  [id number]              Height:       74.0 in Accession #:    [id number]             Weight:       228.0 lb Date of Birth:  06-Aug-1975              BSA:          2.298 m Patient Age:    46 years               BP:           114/83 mmHg Patient Gender: M                      HR:           103 bpm. [name] Procedure: 2D Echo, Cardiac Doppler and Color Doppler Indications:    Leg swelling  History:        Patient has no prior history of Echocardiogram examinations.                 Risk Factors:Dyslipidemia. Leg swelling.  Sonographer:    Mikki Harbor  Referring Phys: [id number] VISHNU P MALLIPEDDI  Sonographer Comments: Patient is obese. IMPRESSIONS  1. Left ventricular ejection fraction, by estimation, is 65 to 70%. The left ventricle has normal function. The left ventricle has no regional wall motion abnormalities. There is mild concentric left ventricular hypertrophy. Left ventricular diastolic parameters are consistent with Grade I diastolic dysfunction (impaired relaxation).  2. Right ventricular systolic function is normal. The right ventricular size is normal. Tricuspid regurgitation signal is inadequate for assessing PA pressure.  3. The mitral valve is grossly normal. Trivial mitral valve regurgitation.  4. The aortic valve was not well visualized, likely tricuspid. Aortic valve regurgitation is not visualized. No aortic stenosis is present. Aortic valve mean gradient measures 3.0 mmHg.  5. Aortic dilatation noted. There is mild dilatation of the aortic root, measuring 41 mm.  6. The inferior vena cava is normal in size with greater than 50% respiratory variability, suggesting right atrial pressure of 3 mmHg. Comparison(s): No prior Echocardiogram. FINDINGS  Left Ventricle: Left ventricular ejection fraction, by estimation, is 65 to 70%. The left ventricle has normal function. The left ventricle has no regional wall motion abnormalities. The left ventricular internal cavity size was normal in size. There is  mild concentric left ventricular hypertrophy. Left ventricular diastolic parameters are consistent with Grade I diastolic dysfunction (impaired relaxation). Right Ventricle: The right ventricular size is  normal. No increase in right ventricular wall thickness. Right ventricular systolic function is normal. Tricuspid regurgitation signal is inadequate for assessing PA pressure. Left Atrium: Left atrial size was normal in size. Right Atrium: Right atrial size was normal in size. Pericardium: There is no evidence of pericardial effusion. Presence of  epicardial fat layer. Mitral Valve: The mitral valve is grossly normal. Trivial mitral valve regurgitation. MV peak gradient, 3.7 mmHg. The mean mitral valve gradient is 1.5 mmHg. Tricuspid Valve: The tricuspid valve is grossly normal. Tricuspid valve regurgitation is trivial. Aortic Valve: The aortic valve was not well visualized. There is mild aortic valve annular calcification. Aortic valve regurgitation is not visualized. No aortic stenosis is present. Aortic valve mean gradient measures 3.0 mmHg. Aortic valve peak gradient measures 6.9 mmHg. Aortic valve area, by VTI measures 2.78 cm. Pulmonic Valve: The pulmonic valve was grossly normal. Pulmonic valve regurgitation is trivial. Aorta: Aortic dilatation noted. There is mild dilatation of the aortic root, measuring 41 mm. Venous: The inferior vena cava is normal in size with greater than 50% respiratory variability, suggesting right atrial pressure of 3 mmHg. IAS/Shunts: No atrial level shunt detected by color flow Doppler.  LEFT VENTRICLE PLAX 2D LVIDd:         4.20 cm   Diastology LVIDs:         2.60 cm   LV e' medial:    7.62 cm/s LV PW:         1.10 cm   LV E/e' medial:  7.0 LV IVS:        1.30 cm   LV e' lateral:   10.10 cm/s LVOT diam:     2.10 cm   LV E/e' lateral: 5.3 LV SV:         62 LV SV Index:   27 LVOT Area:     3.46 cm  RIGHT VENTRICLE RV Basal diam:  3.00 cm RV Mid diam:    2.70 cm RV S prime:     13.80 cm/s TAPSE (M-mode): 2.6 cm LEFT ATRIUM             Index LA diam:        3.40 cm 1.48 cm/m LA Vol (A2C):   36.2 ml 15.75 ml/m LA Vol (A4C):   42.7 ml 18.58 ml/m LA Biplane Vol: 39.7 ml 17.28 ml/m  AORTIC VALVE AV Area (Vmax):    2.31 cm AV Area (Vmean):   2.59 cm AV Area (VTI):     2.78 cm AV Vmax:           131.00 cm/s AV Vmean:          75.300 cm/s AV VTI:            0.222 m AV Peak Grad:      6.9 mmHg AV Mean Grad:      3.0 mmHg LVOT Vmax:         87.20 cm/s LVOT Vmean:        56.300 cm/s LVOT VTI:          0.178 m LVOT/AV VTI ratio:  0.80  AORTA Ao Root diam: 4.10 cm Ao Arch diam: 3.7 cm MITRAL VALVE MV Area (PHT): 5.62 cm    SHUNTS MV Area VTI:   3.38 cm    Systemic VTI:  0.18 m MV Peak grad:  3.7 mmHg    Systemic Diam: 2.10 cm MV Mean grad:  1.5 mmHg MV Vmax:       0.96 m/s  MV Vmean:      57.0 cm/s MV Decel Time: 135 msec MV E velocity: 53.50 cm/s MV A velocity: 85.00 cm/s MV E/A ratio:  0.63 Nona Dell MD Electronically signed by Nona Dell MD Signature Date/Time: 01/22/2023/2:06:29 PM    Final    DG Chest 2 View  Result Date: 01/10/2023 CLINICAL DATA:  Thrush, chest pain. EXAM: CHEST - 2 VIEW COMPARISON:  12/12/2022. FINDINGS: Low lung volumes accentuate the pulmonary vasculature and cardiomediastinal silhouette. No consolidation pulmonary edema. No pleural effusion or pneumothorax. Visualized bones and upper abdomen are unremarkable. IMPRESSION: No evidence of acute cardiopulmonary disease. Electronically Signed   By: Orvan Falconer M.D.   On: 01/10/2023 11:16    Catarina Hartshorn, DO  [facility]  If 7PM-7AM, please contact night-coverage [email or web address] Password [email or web address] 02/02/2023, 9:04 AM   LOS: 5 days

## 2023-02-03 DIAGNOSIS — E8809 Other disorders of plasma-protein metabolism, not elsewhere classified: Secondary | ICD-10-CM

## 2023-02-03 LAB — BASIC METABOLIC PANEL
Anion gap: 10 (ref 5–15)
BUN: 6 mg/dL (ref 6–20)
CO2: 22 mmol/L (ref 22–32)
Calcium: 8.2 mg/dL — ABNORMAL LOW (ref 8.9–10.3)
Chloride: 103 mmol/L (ref 98–111)
Creatinine, Ser: 0.57 mg/dL — ABNORMAL LOW (ref 0.61–1.24)
GFR, Estimated: >60 mL/min (ref >60)
Glucose, Bld: 88 mg/dL (ref 70–99)
Potassium: 3.5 mmol/L (ref 3.5–5.1)
Sodium: 135 mmol/L (ref 135–145)

## 2023-02-03 LAB — CBC
HCT: 39 % (ref 39.0–52.0)
Hemoglobin: 12.8 g/dL — ABNORMAL LOW (ref 13.0–17.0)
MCH: 34 pg (ref 26.0–34.0)
MCHC: 32.8 g/dL (ref 30.0–36.0)
MCV: 103.7 fL — ABNORMAL HIGH (ref 80.0–100.0)
Platelets: 311 10*3/uL (ref 150–400)
RBC: 3.76 MIL/uL — ABNORMAL LOW (ref 4.22–5.81)
RDW: 16.3 % — ABNORMAL HIGH (ref 11.5–15.5)
WBC: 6.6 10*3/uL (ref 4.0–10.5)
nRBC: 0 % (ref 0.0–0.2)

## 2023-02-03 LAB — URIC ACID: Uric Acid, Serum: 6.8 mg/dL (ref 3.7–8.6)

## 2023-02-03 LAB — CK: Total CK: 43 U/L — ABNORMAL LOW (ref 49–397)

## 2023-02-03 LAB — MAGNESIUM: Magnesium: 1.7 mg/dL (ref 1.7–2.4)

## 2023-02-03 MED ORDER — OXYCODONE-ACETAMINOPHEN 5-325 MG PO TABS: 1.0000 | ORAL_TABLET | Freq: Four times a day (QID) | ORAL | 0 refills | Status: DC | PRN

## 2023-02-03 MED ORDER — MAGNESIUM OXIDE -MG SUPPLEMENT 400 (240 MG) MG PO TABS: 400.0000 mg | ORAL_TABLET | Freq: Two times a day (BID) | ORAL | 0 refills | Status: DC

## 2023-02-03 MED ORDER — PREGABALIN 25 MG PO CAPS
25.0000 mg | ORAL_CAPSULE | Freq: Once | ORAL | Status: AC
  Administered 2023-02-03: 25 mg via ORAL
  Filled 2023-02-03: qty 1

## 2023-02-03 MED ORDER — NICOTINE 21 MG/24HR TD PT24: 21.0000 mg | MEDICATED_PATCH | Freq: Every day | TRANSDERMAL | 1 refills | Status: DC

## 2023-02-03 NOTE — Discharge Summary (Signed)
Physician Discharge Summary   Patient: Connor Copeland MRN: [id number] DOB: 04/08/1976  Admit date:     01/28/2023  Discharge date: 02/03/23  Discharge Physician: Onalee Hua Verlisa Vara   PCP: Benita Stabile, MD   Recommendations at discharge:  { Please follow up with primary care provider within 1-2 weeks  Please repeat BMP and CBC in one week      Hospital Course: 47 year old male long time heavy alcohol abuse (at least one fifth of liquor daily), chronic painful peripheral neuropathy, gout, tobacco abuser (2 ppd), hyperlipidemia, hypertriglyceridemia, FH CAD has been having increasing symptoms of painful neuropathy that has progressed over the past 2 months.  He has been evaluated by primary care and neurology.  He recently saw neurology and was started on lamictal rapid taper up. Pt reports he stopped taking it.  He finally decided that alcohol was the cause of his painful feet and legs and he stopped alcohol completely 2 days PTA.   His mother reported that he has not been eating or drinking fluids in the past couple of days.  She reported that he was in alcohol rehab about 10 years ago but has been drinking consistently heavily every day for past 10 years.  She came home to witness patient sitting in a recliner having a tonic clonic seizure.  He bit his tongue and lost control of his bowel and bladder.  [facility] was called and brought him into the ED. Seizure had stopped by the time he arrived, but pt was post-ictal and lethargic.  His BS was normal.    He was evaluated in ED and found to have normal head CT and CT cervical spine.  He had evidence of tongue lacerations and was noted to have hyponatremia and mild LFT elevation.  He is being admitted to stepdown ICU for acute alcohol withdrawal.   He remained agitated on CIWA ativan.  He was started on precedex on 01/29/23 He was subsequently weaned off on 02/01/23 and remained clinically stable.  He was physically deconditioned.  PT was ordered and  he made functional gains daily.  HHPT will be set up   Assessment and Plan: Acute Alcohol Withdrawal / Alcohol withdrawal seizure  - Pt stopped alcohol completely 2 days PTA after drinking heavily everyday for at least 10 years - tonic clonic seizure witnessed by his mother  - was agitated and combative on CIWA Ativan>>precedex started - continue precedex>>weaned off 6/29 at 0720 - 6/30--intermittent confusion overnight, but calm and A&O x 4 in am - 7/1 calm and A&O x 4 - continue IVF>>saline locked - urine toxicology screen>>positive opiates - seizure precautions ordered  - when medically stabilized TOC consultation for alcohol treatment program options -UA--neg for pyuria   Tongue Laceration  - ordered peridex mouth rinse three times daily x 4 days   Tobacco abuser - smokes at least 2 ppd per mother - nicotine patch 21 mg daily ordered, could add an additional patch 14 or 21 mg if needed - counseled on tobacco cessation but he does not seem ready at this time   painful peripheral polyneuropathy - secondary to chronic daily alcohol abuse - he was recently started on lamictal by neurology but stopped taking it>>restart 25 mg daily - resumed home hydrocodone/APAP started by PCP last month but pt continued to complain of poor pain control>>percocet - mother reports that he had been taking them frequently at home contrary to the prescription directions - PDMP reviewed--Norco 7.5/325, #90, last refill 01/20/23 -  Ativan 0.5mg , #30, last refill 12/27/22   Hypokalemia/Hypomagnesemia -repleting -added KCl to IVF   Hyponatremia  - secondary to poor oral intake  - hydrating with IV fluid--improving   Elevated transaminases - he had normal LFTs 01/10/23 - trending down>>improved - mother also reported that patient was taking hydrocodone/APAP tabs much more frequently than prescribed   Hyperammonemia -level 54 -clinical significance unclear presently -monitor mentation as sedation is  weaned   Thrombocytopenia -due to Etoh -monitor for signs of bleeding -now overall stable        Consultants: none Procedures performed: none  Disposition: Home Diet recommendation:  Cardiac diet DISCHARGE MEDICATION: Allergies as of 02/03/2023       Reactions   Gabapentin Swelling   Swelling off the legs        Medication List     STOP taking these medications    HYDROcodone-acetaminophen 7.5-325 MG tablet Commonly known as: NORCO       TAKE these medications    furosemide 20 MG tablet Commonly known as: LASIX Take 2 tablets (40 mg total) by mouth daily for 3 days.   LORazepam 0.5 MG tablet Commonly known as: ATIVAN Take 0.5 mg by mouth daily.   magnesium oxide 400 (240 Mg) MG tablet Commonly known as: MAG-OX Take 1 tablet (400 mg total) by mouth 2 (two) times daily.   nicotine 21 mg/24hr patch Commonly known as: NICODERM CQ - dosed in mg/24 hours Place 1 patch (21 mg total) onto the skin daily. Start taking on: February 04, 2023   oxyCODONE-acetaminophen 5-325 MG tablet Commonly known as: PERCOCET/ROXICET Take 1 tablet by mouth every 6 (six) hours as needed for moderate pain.        Discharge Exam: Filed Weights   01/29/23 0500 01/31/23 0452 02/01/23 0400  Weight: 102.1 kg 104.3 kg 104.6 kg   HEENT:  /AT, No thrush, no icterus CV:  RRR, no rub, no S3, no S4 Lung:  CTA, no wheeze, no rhonchi Abd:  soft/+BS, NT Ext:   1 + LE edema, no lymphangitis, no synovitis, no rash   Condition at discharge: stable  The results of significant diagnostics from this hospitalization (including imaging, microbiology, ancillary and laboratory) are listed below for reference.   Imaging Studies: US Venous Img Lower Bilateral (DVT)  Result Date: 02/02/2023 CLINICAL DATA:  Pain EXAM: BILATERAL LOWER EXTREMITY VENOUS DOPPLER ULTRASOUND TECHNIQUE: Gray-scale sonography with graded compression, as well as color Doppler and duplex ultrasound were performed to  evaluate the lower extremity deep venous systems from the level of the common femoral vein and including the common femoral, femoral, profunda femoral, popliteal and calf veins including the posterior tibial, peroneal and gastrocnemius veins when visible. The superficial great saphenous vein was also interrogated. Spectral Doppler was utilized to evaluate flow at rest and with distal augmentation maneuvers in the common femoral, femoral and popliteal veins. COMPARISON:  None Available. FINDINGS: RIGHT LOWER EXTREMITY Common Femoral Vein: No evidence of thrombus. Normal compressibility, respiratory phasicity and response to augmentation. Saphenofemoral Junction: No evidence of thrombus. Normal compressibility and flow on color Doppler imaging. Profunda Femoral Vein: No evidence of thrombus. Normal compressibility and flow on color Doppler imaging. Femoral Vein: No evidence of thrombus. Normal compressibility, respiratory phasicity and response to augmentation. Popliteal Vein: No evidence of thrombus. Normal compressibility, respiratory phasicity and response to augmentation. Calf Veins: No evidence of thrombus. Normal compressibility and flow on color Doppler imaging. Superficial Great Saphenous Vein: No evidence of thrombus. Normal compressibility. Venous Reflux:  None. Other Findings:  None. LEFT LOWER EXTREMITY Common Femoral Vein: No evidence of thrombus. Normal compressibility, respiratory phasicity and response to augmentation. Saphenofemoral Junction: No evidence of thrombus. Normal compressibility and flow on color Doppler imaging. Profunda Femoral Vein: No evidence of thrombus. Normal compressibility and flow on color Doppler imaging. Femoral Vein: No evidence of thrombus. Normal compressibility, respiratory phasicity and response to augmentation. Popliteal Vein: No evidence of thrombus. Normal compressibility, respiratory phasicity and response to augmentation. Calf Veins: No evidence of thrombus. Normal  compressibility and flow on color Doppler imaging. Superficial Great Saphenous Vein: No evidence of thrombus. Normal compressibility. Venous Reflux:  None. Other Findings:  None. IMPRESSION: No evidence of deep venous thrombosis in either lower extremity. Electronically Signed   By: Ernie Avena M.D.   On: 02/02/2023 12:42   CT Cervical Spine Wo Contrast  Result Date: 01/28/2023 CLINICAL DATA:  Polytrauma, blunt EXAM: CT CERVICAL SPINE WITHOUT CONTRAST TECHNIQUE: Multidetector CT imaging of the cervical spine was performed without intravenous contrast. Multiplanar CT image reconstructions were also generated. RADIATION DOSE REDUCTION: This exam was performed according to the departmental dose-optimization program which includes automated exposure control, adjustment of the mA and/or kV according to patient size and/or use of iterative reconstruction technique. COMPARISON:  None Available. FINDINGS: Alignment: Normal Skull base and vertebrae: No acute fracture. No primary bone lesion or focal pathologic process. Soft tissues and spinal canal: No prevertebral fluid or swelling. No visible canal hematoma. Disc levels:  Disc space narrowing and early spurring at C6-7. Upper chest: No acute findings Other: None IMPRESSION: No acute bony abnormality. Electronically Signed   By: Charlett Nose M.D.   On: 01/28/2023 10:09   CT Head Wo Contrast  Result Date: 01/28/2023 CLINICAL DATA:  Seizure, new-onset, history of trauma EXAM: CT HEAD WITHOUT CONTRAST TECHNIQUE: Contiguous axial images were obtained from the base of the skull through the vertex without intravenous contrast. RADIATION DOSE REDUCTION: This exam was performed according to the departmental dose-optimization program which includes automated exposure control, adjustment of the mA and/or kV according to patient size and/or use of iterative reconstruction technique. COMPARISON:  None Available. FINDINGS: Brain: No acute intracranial abnormality.  Specifically, no hemorrhage, hydrocephalus, mass lesion, acute infarction, or significant intracranial injury. Vascular: No hyperdense vessel or unexpected calcification. Skull: No acute calvarial abnormality. Sinuses/Orbits: No acute findings Other: None IMPRESSION: Normal study. Electronically Signed   By: Charlett Nose M.D.   On: 01/28/2023 10:03   DG Chest Port 1 View  Result Date: 01/28/2023 CLINICAL DATA:  47 year old male status post seizure. EXAM: PORTABLE CHEST 1 VIEW COMPARISON:  Chest radiographs 01/10/2023 and earlier. FINDINGS: Portable AP upright view at 0936 hours. Improved lung volumes. Normal cardiac size and mediastinal contours. Visualized tracheal air column is within normal limits. Allowing for portable technique the lungs are clear. No pneumothorax or pleural effusion. No acute osseous abnormality identified. Paucity of visible bowel gas. IMPRESSION: Negative portable chest. Electronically Signed   By: Odessa Fleming M.D.   On: 01/28/2023 09:47   ECHOCARDIOGRAM COMPLETE  Result Date: 01/22/2023    ECHOCARDIOGRAM REPORT   Patient Name:   Connor Copeland Date of Exam: 01/22/2023 Medical Rec #:  [id number]              Height:       74.0 in Accession #:    [id number]             Weight:       228.0 lb Date of Birth:  1976/04/22              BSA:          2.298 m Patient Age:    46 years               BP:           114/83 mmHg Patient Gender: M                      HR:           103 bpm. [name] Procedure: 2D Echo, Cardiac Doppler and Color Doppler Indications:    Leg swelling  History:        Patient has no prior history of Echocardiogram examinations.                 Risk Factors:Dyslipidemia. Leg swelling.  Sonographer:    Mikki Harbor Referring Phys: [id number] VISHNU P MALLIPEDDI  Sonographer Comments: Patient is obese. IMPRESSIONS  1. Left ventricular ejection fraction, by estimation, is 65 to 70%. The left ventricle has normal function. The left ventricle has no  regional wall motion abnormalities. There is mild concentric left ventricular hypertrophy. Left ventricular diastolic parameters are consistent with Grade I diastolic dysfunction (impaired relaxation).  2. Right ventricular systolic function is normal. The right ventricular size is normal. Tricuspid regurgitation signal is inadequate for assessing PA pressure.  3. The mitral valve is grossly normal. Trivial mitral valve regurgitation.  4. The aortic valve was not well visualized, likely tricuspid. Aortic valve regurgitation is not visualized. No aortic stenosis is present. Aortic valve mean gradient measures 3.0 mmHg.  5. Aortic dilatation noted. There is mild dilatation of the aortic root, measuring 41 mm.  6. The inferior vena cava is normal in size with greater than 50% respiratory variability, suggesting right atrial pressure of 3 mmHg. Comparison(s): No prior Echocardiogram. FINDINGS  Left Ventricle: Left ventricular ejection fraction, by estimation, is 65 to 70%. The left ventricle has normal function. The left ventricle has no regional wall motion abnormalities. The left ventricular internal cavity size was normal in size. There is  mild concentric left ventricular hypertrophy. Left ventricular diastolic parameters are consistent with Grade I diastolic dysfunction (impaired relaxation). Right Ventricle: The right ventricular size is normal. No increase in right ventricular wall thickness. Right ventricular systolic function is normal. Tricuspid regurgitation signal is inadequate for assessing PA pressure. Left Atrium: Left atrial size was normal in size. Right Atrium: Right atrial size was normal in size. Pericardium: There is no evidence of pericardial effusion. Presence of epicardial fat layer. Mitral Valve: The mitral valve is grossly normal. Trivial mitral valve regurgitation. MV peak gradient, 3.7 mmHg. The mean mitral valve gradient is 1.5 mmHg. Tricuspid Valve: The tricuspid valve is grossly normal.  Tricuspid valve regurgitation is trivial. Aortic Valve: The aortic valve was not well visualized. There is mild aortic valve annular calcification. Aortic valve regurgitation is not visualized. No aortic stenosis is present. Aortic valve mean gradient measures 3.0 mmHg. Aortic valve peak gradient measures 6.9 mmHg. Aortic valve area, by VTI measures 2.78 cm. Pulmonic Valve: The pulmonic valve was grossly normal. Pulmonic valve regurgitation is trivial. Aorta: Aortic dilatation noted. There is mild dilatation of the aortic root, measuring 41 mm. Venous: The inferior vena cava is normal in size with greater than 50% respiratory variability, suggesting right atrial pressure of 3 mmHg. IAS/Shunts: No atrial level shunt detected by color flow Doppler.  LEFT VENTRICLE  PLAX 2D LVIDd:         4.20 cm   Diastology LVIDs:         2.60 cm   LV e' medial:    7.62 cm/s LV PW:         1.10 cm   LV E/e' medial:  7.0 LV IVS:        1.30 cm   LV e' lateral:   10.10 cm/s LVOT diam:     2.10 cm   LV E/e' lateral: 5.3 LV SV:         62 LV SV Index:   27 LVOT Area:     3.46 cm  RIGHT VENTRICLE RV Basal diam:  3.00 cm RV Mid diam:    2.70 cm RV S prime:     13.80 cm/s TAPSE (M-mode): 2.6 cm LEFT ATRIUM             Index LA diam:        3.40 cm 1.48 cm/m LA Vol (A2C):   36.2 ml 15.75 ml/m LA Vol (A4C):   42.7 ml 18.58 ml/m LA Biplane Vol: 39.7 ml 17.28 ml/m  AORTIC VALVE AV Area (Vmax):    2.31 cm AV Area (Vmean):   2.59 cm AV Area (VTI):     2.78 cm AV Vmax:           131.00 cm/s AV Vmean:          75.300 cm/s AV VTI:            0.222 m AV Peak Grad:      6.9 mmHg AV Mean Grad:      3.0 mmHg LVOT Vmax:         87.20 cm/s LVOT Vmean:        56.300 cm/s LVOT VTI:          0.178 m LVOT/AV VTI ratio: 0.80  AORTA Ao Root diam: 4.10 cm Ao Arch diam: 3.7 cm MITRAL VALVE MV Area (PHT): 5.62 cm    SHUNTS MV Area VTI:   3.38 cm    Systemic VTI:  0.18 m MV Peak grad:  3.7 mmHg    Systemic Diam: 2.10 cm MV Mean grad:  1.5 mmHg MV Vmax:        0.96 m/s MV Vmean:      57.0 cm/s MV Decel Time: 135 msec MV E velocity: 53.50 cm/s MV A velocity: 85.00 cm/s MV E/A ratio:  0.63 Nona Dell MD Electronically signed by Nona Dell MD Signature Date/Time: 01/22/2023/2:06:29 PM    Final    DG Chest 2 View  Result Date: 01/10/2023 CLINICAL DATA:  Thrush, chest pain. EXAM: CHEST - 2 VIEW COMPARISON:  12/12/2022. FINDINGS: Low lung volumes accentuate the pulmonary vasculature and cardiomediastinal silhouette. No consolidation pulmonary edema. No pleural effusion or pneumothorax. Visualized bones and upper abdomen are unremarkable. IMPRESSION: No evidence of acute cardiopulmonary disease. Electronically Signed   By: Orvan Falconer M.D.   On: 01/10/2023 11:16    Microbiology: Results for orders placed or performed during the hospital encounter of 01/28/23  MRSA Next Gen by PCR, Nasal     Status: None   Collection Time: 01/28/23 12:45 PM   Specimen: Nasal Mucosa; Nasal Swab  Result Value Ref Range Status   MRSA by PCR Next Gen NOT DETECTED NOT DETECTED Final    Comment: (NOTE) The GeneXpert MRSA Assay (FDA approved for NASAL specimens only), is one component of a comprehensive MRSA colonization surveillance program.  It is not intended to diagnose MRSA infection nor to guide or monitor treatment for MRSA infections. Test performance is not FDA approved in patients less than 69 years old. Performed at [facility], [address] [place], [place] [address]     Labs: CBC: Recent Labs  Lab 01/28/23 0940 01/28/23 1221 01/29/23 0423 01/30/23 0425 01/31/23 0415 02/01/23 0427 02/03/23 0502  WBC 7.4   < > 6.1 4.9 6.6 5.8 6.6  NEUTROABS 5.5  --  4.1 3.4 4.5 3.4  --   HGB 13.6   < > 13.2 13.2 12.9* 12.7* 12.8*  HCT 39.4   < > 37.8* 37.3* 37.5* 36.4* 39.0  MCV 102.3*   < > 101.1* 101.9* 101.6* 102.5* 103.7*  PLT 135*   < > 106* 114* 100* 125* 311   < > = values in this interval not displayed.   Basic Metabolic Panel: Recent Labs   Lab 01/29/23 0423 01/30/23 0425 01/31/23 0415 02/01/23 0427 02/02/23 0508 02/03/23 0502  NA 130* 132* 132* 132* 135 135  K 2.8* 3.4* 3.6 3.3* 3.3* 3.5  CL 95* 101 102 101 105 103  CO2 24 21* 21* 23 22 22   GLUCOSE 71 80 94 95 90 88  BUN 5* 9 9 7 6 6   CREATININE 0.56* 0.54* 0.63 0.60* 0.57* 0.57*  CALCIUM 7.2* 7.4* 7.3* 7.3* 7.5* 8.2*  MG 1.9 1.9 1.6*  --  1.7 1.7  PHOS 2.7 3.4  --   --   --   --    Liver Function Tests: Recent Labs  Lab 01/29/23 0423 01/30/23 0425 01/31/23 0415 02/01/23 0427 02/02/23 0508  AST 74* 65* 59* 42* 34  ALT 44 41 40 38 36  ALKPHOS 74 79 80 83 80  BILITOT 1.6* 1.7* 1.4* 0.9 0.9  PROT 4.7* 4.7* 4.8* 4.9* 4.7*  ALBUMIN 2.5* 2.3* 2.3* 2.2* 2.1*   CBG: Recent Labs  Lab 01/31/23 1940 01/31/23 2305 02/01/23 0410 02/01/23 0739 02/01/23 1136  GLUCAP 93 73 114* 83 100*    Discharge time spent: greater than 30 minutes.  Signed: Catarina Hartshorn, MD [facility] 02/03/2023

## 2023-02-03 NOTE — TOC Initial Note (Signed)
Transition of Care [facility]) - Initial/Assessment Note    Patient Details  Name: Connor Copeland MRN: [id number] Date of Birth: 29-Jan-1976  Transition of Care [facility]) CM/SW Contact:    Annice Needy, LCSW Phone Number: 02/03/2023, 12:00 PM  Clinical Narrative:                 Patient from home with mother. Independent with ADLs, drives, works full-time. Discharging home with HH and RW. TOC consulted for SA resources. Patient declines SA resources. States that he is making the choice to not drink currently. Ten years ago, patient went to [facility] and was in treatment for a year. He states that he does not need additional treatment for alcohol use.   Expected Discharge Plan: Home w Home Health Services Barriers to Discharge: No Barriers Identified   Patient Goals and CMS Choice Patient states their goals for this hospitalization and ongoing recovery are:: return home with [facility]          Expected Discharge Plan and Services       Living arrangements for the past 2 months: Single Family Home Expected Discharge Date: 02/03/23                 DME Agency: [employer] Date DME Agency Contacted: 02/03/23 Time DME Agency Contacted: 1146 Representative spoke with at DME Agency: [facility] HH Arranged: PT HH Agency: [facility] Date [facility] Agency Contacted: 02/03/23 Time HH Agency Contacted: 1200 Representative spoke with at [facility] Agency: Clifton Custard  Prior Living Arrangements/Services Living arrangements for the past 2 months: Single Family Home Lives with:: Parents Patient language and need for interpreter reviewed:: Yes Do you feel safe going back to the place where you live?: Yes      Need for Family Participation in Patient Care: Yes (Comment)     Criminal Activity/Legal Involvement Pertinent to Current Situation/Hospitalization: No - Comment as needed  Activities of Daily Living Home Assistive Devices/Equipment: None ADL Screening (condition at time of  admission) Patient's cognitive ability adequate to safely complete daily activities?: Yes Is the patient deaf or have difficulty hearing?: No Does the patient have difficulty seeing, even when wearing glasses/contacts?: No Does the patient have difficulty concentrating, remembering, or making decisions?: No Patient able to express need for assistance with ADLs?: Yes Does the patient have difficulty dressing or bathing?: No Independently performs ADLs?: Yes (appropriate for developmental age) Does the patient have difficulty walking or climbing stairs?: No Weakness of Legs: Both Weakness of Arms/Hands: None  Permission Sought/Granted Permission sought to share information with : Family Supports    Share Information with NAME: mother and sister were at bedside           Emotional Assessment     Affect (typically observed): Appropriate Orientation: : Oriented to Self, Oriented to Place, Oriented to  Time Alcohol / Substance Use: Tobacco Use, Alcohol Use Psych Involvement: No (comment)  Admission diagnosis:  Alcohol withdrawal seizure (HCC) [W09.811, R56.9] Patient Active Problem List   Diagnosis Date Noted   Delirium tremens (HCC) 01/29/2023   Alcohol withdrawal seizure (HCC) 01/28/2023   Alcohol abuse 01/28/2023   Tobacco use disorder 01/28/2023   Hyponatremia 01/28/2023   Elevated MCV 01/28/2023   Macrocytic anemia 01/28/2023   Elevated transaminase level 01/28/2023   Hyperbilirubinemia 01/28/2023   Gout 01/28/2023   HLD (hyperlipidemia) 12/11/2022   Hypertriglyceridemia 12/11/2022   Family history of coronary artery disease 12/11/2022   Peripheral neuropathy 12/11/2022   Leg swelling 12/11/2022   Hypoalbuminemia  12/11/2022   PCP:  Benita Stabile, MD Pharmacy:   [facility] - [place] - [address] [place] [address] Phone: [phone] Fax: [phone]  [facility] [address] [place] [address] Phone: [phone] Fax: [phone]     Social Determinants of Health (SDOH) Social History: SDOH Screenings   Food Insecurity: No Food Insecurity (01/28/2023)  Housing: Low Risk  (01/28/2023)  Transportation Needs: No Transportation Needs (01/28/2023)  Utilities: Not At Risk (01/28/2023)  Tobacco Use: High Risk (01/28/2023)   SDOH Interventions:     Readmission Risk Interventions     No data to display

## 2023-02-03 NOTE — Progress Notes (Signed)
Mobility Specialist Progress Note:   02/03/23 1011  Mobility  Activity Ambulated with assistance in hallway  Level of Assistance Standby assist, set-up cues, supervision of patient - no hands on  Assistive Device Front wheel walker  Distance Ambulated (ft) 35 ft  Range of Motion/Exercises Active;All extremities  Activity Response Tolerated well  Mobility Referral Yes  $Mobility charge 1 Mobility  Mobility Specialist Start Time (ACUTE ONLY) 1011  Mobility Specialist Stop Time (ACUTE ONLY) 1021  Mobility Specialist Time Calculation (min) (ACUTE ONLY) 10 min   Pt received in bed, family at bedside. Agreeable to mobility session, ambulated in hallway with RW. Slow labored cadence, tolerated well, asx throughout. Returned pt to room, all needs met.   Feliciana Rossetti Mobility Specialist Please contact via Special educational needs teacher or  Rehab office at [phone]

## 2023-02-03 NOTE — Progress Notes (Signed)
Patient presenting very agitated and upset, stating his gout is acting up. Dr. Carren Rang made aware, see new orders. Patient is not happy and stated he needs prednisone. PRN pain medication given,

## 2023-02-05 ENCOUNTER — Ambulatory Visit (INDEPENDENT_AMBULATORY_CARE_PROVIDER_SITE_OTHER): Payer: 59

## 2023-02-05 DIAGNOSIS — M791 Myalgia, unspecified site: Secondary | ICD-10-CM

## 2023-02-05 DIAGNOSIS — R208 Other disturbances of skin sensation: Secondary | ICD-10-CM

## 2023-02-13 ENCOUNTER — Ambulatory Visit (INDEPENDENT_AMBULATORY_CARE_PROVIDER_SITE_OTHER): Payer: 59 | Admitting: Internal Medicine

## 2023-02-13 ENCOUNTER — Encounter: Payer: Self-pay | Admitting: Internal Medicine

## 2023-02-13 VITALS — BP 120/81 | HR 106 | Temp 97.5°F | Ht 74.0 in | Wt 221.8 lb

## 2023-02-13 DIAGNOSIS — Z1211 Encounter for screening for malignant neoplasm of colon: Secondary | ICD-10-CM | POA: Diagnosis not present

## 2023-02-13 DIAGNOSIS — K700 Alcoholic fatty liver: Secondary | ICD-10-CM | POA: Diagnosis not present

## 2023-02-13 DIAGNOSIS — K7010 Alcoholic hepatitis without ascites: Secondary | ICD-10-CM | POA: Diagnosis not present

## 2023-02-13 NOTE — Progress Notes (Signed)
Primary Care Physician:  Benita Stabile, MD Primary Gastroenterologist:  Dr. Marletta Lor  Chief Complaint  Patient presents with   New Patient (Initial Visit)    Pt referred for elevated liver enzymes     HPI:   Connor Copeland is a 47 y.o. male who presents to clinic today by referral from his PCP Dr. Margo Aye for evaluation for elevated liver enzymes.  Routine blood work 12/27/2022 revealed AST 186, ALT 158, alk phos 72, T. bili 0.8.  Abdominal ultrasound 12/31/2022 normal gallbladder, fatty liver, abdominal aortic aneurysm 3.9 cm with recommended 10-year recall.  At that time, patient states he was drinking multiple bourbon drinks nightly. Approx 1/5 of liquor daily.  States he has been doing this for over 10 years.  He abruptly stopped "cold [place]" drinking and smoking cigarettes.  2 days later he suffered a seizure at home prompting evaluation in the ER and subsequent hospitalization x 6 days for alcohol withdrawal symptoms.  Since his discharge he has remained alcohol free.  States he has been sober now for 23 days and he is adamant that he will never drink again.  Main complaint for me today is neuropathy in his legs.  Currently being seen and worked up by neurology.  No family history of liver disease.  No herbal supplements.  No illicit drug use.  Most recent LFTs in the hospital have completely normalized.  Physical therapy services are coming to his home which he recently started.  In regards to colon cancer screening, denies any family history of colorectal malignancy.  No previous colonoscopy.  No melena hematochezia.     Past Medical History:  Diagnosis Date   ETOH abuse    Gout     Past Surgical History:  Procedure Laterality Date   FOOT SURGERY      Current Outpatient Medications  Medication Sig Dispense Refill   magnesium oxide (MAG-OX) 400 (240 Mg) MG tablet Take 1 tablet (400 mg total) by mouth 2 (two) times daily. 60 tablet 0   nicotine (NICODERM CQ -  DOSED IN MG/24 HOURS) 21 mg/24hr patch Place 1 patch (21 mg total) onto the skin daily. 28 patch 1   potassium chloride SA (KLOR-CON M) 20 MEQ tablet Take 20 mEq by mouth 2 (two) times daily.     furosemide (LASIX) 20 MG tablet Take 2 tablets (40 mg total) by mouth daily for 3 days. (Patient not taking: Reported on 01/28/2023) 6 tablet 0   LORazepam (ATIVAN) 0.5 MG tablet Take 0.5 mg by mouth daily. (Patient not taking: Reported on 02/13/2023)     oxyCODONE-acetaminophen (PERCOCET/ROXICET) 5-325 MG tablet Take 1 tablet by mouth every 6 (six) hours as needed for moderate pain. (Patient not taking: Reported on 02/13/2023) 12 tablet 0   No current facility-administered medications for this visit.    Allergies as of 02/13/2023 - Review Complete 02/13/2023  Allergen Reaction Noted   Gabapentin Swelling 01/10/2023    Family History  Problem Relation Age of Onset   COPD Father     Social History   Socioeconomic History   Marital status: Divorced    Spouse name: Not on file   Number of children: Not on file   Years of education: Not on file   Highest education level: Not on file  Occupational History   Not on file  Tobacco Use   Smoking status: Every Day    Current packs/day: 1.00    Types: Cigarettes   Smokeless tobacco: Never  Substance and Sexual Activity   Alcohol use: Yes    Alcohol/week: 42.0 standard drinks of alcohol    Types: 42 Standard drinks or equivalent per week    Comment: 5-6 "glasses of bourbon daily"   Drug use: No   Sexual activity: Not on file  Other Topics Concern   Not on file  Social History Narrative   Right Handed   No Caffeine    Social Determinants of Health   Financial Resource Strain: Not on file  Food Insecurity: No Food Insecurity (01/28/2023)   Hunger Vital Sign    Worried About Running Out of Food in the Last Year: Never true    Ran Out of Food in the Last Year: Never true  Transportation Needs: No Transportation Needs (01/28/2023)   PRAPARE  - Administrator, Civil Service (Medical): No    Lack of Transportation (Non-Medical): No  Physical Activity: Not on file  Stress: Not on file  Social Connections: Not on file  Intimate Partner Violence: Not At Risk (01/28/2023)   Humiliation, Afraid, Rape, and Kick questionnaire    Fear of Current or Ex-Partner: No    Emotionally Abused: No    Physically Abused: No    Sexually Abused: No    Subjective: Review of Systems  Constitutional:  Positive for malaise/fatigue. Negative for chills and fever.  HENT:  Negative for congestion and hearing loss.   Eyes:  Negative for blurred vision and double vision.  Respiratory:  Negative for cough and shortness of breath.   Cardiovascular:  Negative for chest pain and palpitations.  Gastrointestinal:  Negative for abdominal pain, blood in stool, constipation, diarrhea, heartburn, melena and vomiting.  Genitourinary:  Negative for dysuria and urgency.  Musculoskeletal:  Negative for joint pain and myalgias.  Skin:  Negative for itching and rash.  Neurological:  Negative for dizziness and headaches.  Psychiatric/Behavioral:  Negative for depression. The patient is not nervous/anxious.        Objective: BP 120/81   Pulse (!) 106   Temp (!) 97.5 F (36.4 C)   Ht 6\' 2"  (1.88 m)   Wt 221 lb 12.8 oz (100.6 kg)   BMI 28.48 kg/m  Physical Exam Constitutional:      Appearance: Normal appearance.  HENT:     Head: Normocephalic and atraumatic.  Eyes:     Extraocular Movements: Extraocular movements intact.     Conjunctiva/sclera: Conjunctivae normal.  Cardiovascular:     Rate and Rhythm: Normal rate and regular rhythm.  Pulmonary:     Effort: Pulmonary effort is normal.     Breath sounds: Normal breath sounds.  Abdominal:     General: Bowel sounds are normal.     Palpations: Abdomen is soft.  Musculoskeletal:        General: Normal range of motion.     Cervical back: Normal range of motion and neck supple.     Right lower  leg: Edema present.     Left lower leg: Edema present.  Skin:    General: Skin is warm.  Neurological:     General: No focal deficit present.     Mental Status: He is alert and oriented to person, place, and time.  Psychiatric:        Mood and Affect: Mood normal.        Behavior: Behavior normal.      Assessment: *Alcoholic steatohepatitis *Chronic alcohol use-sober x 23 days *Colon cancer screening *Alcohol withdrawal/seizure  Plan: Patient has been  sober from alcohol x 23 days.  Most recent LFTs have normalized.  Will recheck in 3 to 4 months.  Discussed alcohol liver disease in depth with patient today.  Other risk factors for fatty liver disease in this patient include being overweight as well as dyslipidemia.  Recommend 1-2# weight loss per week until ideal body weight through exercise & diet. Low fat/cholesterol diet.   Avoid sweets, sodas, fruit juices, sweetened beverages like tea, etc. Gradually increase exercise from 15 min daily up to 1 hr per day 5 days/week. Limit alcohol use.  Discussed screening colonoscopy with patient today.  He would like to hold off for now.  Will discuss again on follow-up visit.  Follow-up in 3 to 4 months.  Thank you Dr. Margo Aye for the kind referral  02/13/2023 9:11 AM   Disclaimer: This note was dictated with voice recognition software. Similar sounding words can inadvertently be transcribed and may not be corrected upon review.

## 2023-02-13 NOTE — Patient Instructions (Signed)
Congratulations on alcohol cessation.  That is fantastic.  Continue to avoid alcohol.  Your liver enzymes have already normalized.  We will plan on repeating these in 3 to 4 months on follow-up visit.  Your ultrasound did show fatty liver likely related to alcohol.  I am hoping this will steadily improve as you continue to live a healthy lifestyle.  Recommend 1-2# weight loss per week until ideal body weight through exercise & diet. Low fat/cholesterol diet.   Avoid sweets, sodas, fruit juices, sweetened beverages like tea, etc. Gradually increase exercise from 15 min daily up to 1 hr per day 5 days/week.  Follow-up in 3 to 4 months or sooner if needed.  It was very nice meeting you today.  Dr. Marletta Lor

## 2023-03-18 ENCOUNTER — Ambulatory Visit: Payer: 59 | Admitting: Neurology

## 2023-03-19 ENCOUNTER — Ambulatory Visit (INDEPENDENT_AMBULATORY_CARE_PROVIDER_SITE_OTHER): Payer: 59 | Admitting: Neurology

## 2023-03-19 ENCOUNTER — Ambulatory Visit: Payer: 59 | Admitting: Neurology

## 2023-03-19 VITALS — BP 130/91 | HR 100 | Ht 74.0 in | Wt 228.0 lb

## 2023-03-19 DIAGNOSIS — G629 Polyneuropathy, unspecified: Secondary | ICD-10-CM | POA: Diagnosis not present

## 2023-03-19 DIAGNOSIS — G40909 Epilepsy, unspecified, not intractable, without status epilepticus: Secondary | ICD-10-CM | POA: Diagnosis not present

## 2023-03-19 DIAGNOSIS — G6181 Chronic inflammatory demyelinating polyneuritis: Secondary | ICD-10-CM | POA: Insufficient documentation

## 2023-03-19 DIAGNOSIS — M792 Neuralgia and neuritis, unspecified: Secondary | ICD-10-CM | POA: Insufficient documentation

## 2023-03-19 MED ORDER — DULOXETINE HCL 60 MG PO CPEP: 60.0000 mg | ORAL_CAPSULE | Freq: Every day | ORAL | 11 refills | Status: DC

## 2023-03-19 MED ORDER — NORTRIPTYLINE HCL 25 MG PO CAPS: 50.0000 mg | ORAL_CAPSULE | Freq: Every day | ORAL | 11 refills | Status: DC

## 2023-03-19 NOTE — Progress Notes (Signed)
ASSESSMENT AND PLAN  Connor Copeland is a 47 y.o. male   Chronic inflammatory demyelinating polyradiculoneuropathy History of alcohol abuse, withdrawal seizure on January 28, 2023 Significant neuropathic pain  Symptom onset since February 2024, plateued around May 2024 EMG nerve conduction study today showed features consistent with demyelinating polyradiculoneuropathy, no evidence of axonal loss, disease course is more than 8 weeks, consistent with diagnosis of CIDP, Lumbar puncture Start IVIG, 2 g/kg loading dose followed by 1 g/kg every 3 weeks, Add on Cymbalta 60 mg daily, nortriptyline 25 mg 2 tablets every night, Continue titrating up lamotrigine  Return To Clinic With Dr. Epimenio Foot in 3 months  DIAGNOSTIC DATA (LABS, IMAGING, TESTING) - I reviewed patient records, labs, notes, testing and imaging myself where available.   MEDICAL HISTORY:  Connor Copeland is a 47 year old male,, seen in request by Dr. Epimenio Foot for electrodiagnostic study for acute onset of gait difficulty, neuropathic pain, his primary care physician is Dr. Margo Aye, Connor Copeland   I reviewed and summarized the referring note. PMHX Smoke 11ppd Alcohol 8 oz.  He used to work as a Production designer, theatre/television/film at [employer], including heavy lifting up to 50 pounds without any difficulty, in February 2024, he noticed bilateral foot numbness tingling burning sensation, symptoms gradually getting worse, ascending to bilateral lower extremity, fairly symmetric, by May 2024, able to reach his thiamine level, he also noticed gait abnormality, fell at his parking lot, was not able to continue his job since then  Around May 2024 he also noticed bilateral fingertips numbness tingling, could not feel things, clumsiness of his fingers,  He denies significant low back pain, no neck pain, no bowel and bladder incontinence  He was given gabapentin, reported significant lower extremity swelling in June 2024, leading to emergency  room visit, venous Doppler study showed no evidence of lower extremity DVT  He used to drink hard liquor about 8 to 10 ounces daily, stopped alcohol abruptly at the end of June, had tonic-clonic seizure 2 days later on January 28, 2023, with tongue biting, bowel and bladder incontinence, ED document evidence of tongue laceration, ICU admission for alcohol withdrawal, remaining agitated, was treated with Ativan as needed, Precedex,  He was given lamotrigine titrating dose for neuropathic pain, which he described as 9 out of 10, especially after weightbearing, burning stinging sensation, could not sleep, he has been on short-term disability since he fell in May,  He denies autonomic failure, such as constipation, tachycardia  Personally reviewed MRI of lumbar February 05, 2023, mild degenerative changes, no significant canal foraminal narrowing  MRI cervical spine, mild degenerative changes most noticeable at C6-7, no evidence of spinal cord or nerve root compression  CT head without contrast was normal  Extensive laboratory evaluation since 2024, normal negative protein electrophoresis, ANA HIV B12, rheumatoid factor was mildly elevated 14.3,, CPK, folic acid was mildly low 3.6, negative alcohol, lamotrigine level was 7, normal vitamin D, TSH, BMP, with low calcium 8.2, CBC 12.8   PHYSICAL EXAM:   130/91 heart rate of 100  PHYSICAL EXAMNIATION:  Gen: NAD, conversant, well nourised, well groomed                     Cardiovascular: Regular rate rhythm, no peripheral edema, warm, nontender. Eyes: Conjunctivae clear without exudates or hemorrhage Neck: Supple, no carotid bruits. Pulmonary: Clear to auscultation bilaterally   NEUROLOGICAL EXAM:  MENTAL STATUS: Speech/cognition: Depressed looking anxious middle-age male,, oriented to history taking and casual conversation  CRANIAL NERVES: CN II: Visual fields are full to confrontation. Pupils are round equal and briskly reactive to light. CN III,  IV, VI: extraocular movement are normal. No ptosis. CN V: Facial sensation is intact to light touch CN VII: Face is symmetric with normal eye closure  CN VIII: Hearing is normal to causal conversation. CN IX, X: Phonation is normal. CN XI: Head turning and shoulder shrug are intact  MOTOR: Motor examination is limited by his significant neuropathic pain, felt there was no significant distal upper or lower extremity muscle weakness  REFLEXES: Areflexia  SENSORY: Length-dependent decreased light touch, pinprick, vibratory sensation to ankle level, preserved to toe proprioception,  COORDINATION: There is no trunk or limb dysmetria noted.  GAIT/STANCE: Limited by his neuropathic pain, cautious, unsteady, difficulty standing up on tiptoe, heels, positive Romberg signs,  REVIEW OF SYSTEMS:  Full 14 system review of systems performed and notable only for as above All other review of systems were negative.   ALLERGIES: Allergies  Allergen Reactions   Gabapentin Swelling    Swelling off the legs    HOME MEDICATIONS: Current Outpatient Medications  Medication Sig Dispense Refill   furosemide (LASIX) 20 MG tablet Take 2 tablets (40 mg total) by mouth daily for 3 days. (Patient not taking: Reported on 01/28/2023) 6 tablet 0   LORazepam (ATIVAN) 0.5 MG tablet Take 0.5 mg by mouth daily. (Patient not taking: Reported on 02/13/2023)     magnesium oxide (MAG-OX) 400 (240 Mg) MG tablet Take 1 tablet (400 mg total) by mouth 2 (two) times daily. 60 tablet 0   nicotine (NICODERM CQ - DOSED IN MG/24 HOURS) 21 mg/24hr patch Place 1 patch (21 mg total) onto the skin daily. 28 patch 1   oxyCODONE-acetaminophen (PERCOCET/ROXICET) 5-325 MG tablet Take 1 tablet by mouth every 6 (six) hours as needed for moderate pain. (Patient not taking: Reported on 02/13/2023) 12 tablet 0   potassium chloride SA (KLOR-CON M) 20 MEQ tablet Take 20 mEq by mouth 2 (two) times daily.     No current facility-administered  medications for this visit.    PAST MEDICAL HISTORY: Past Medical History:  Diagnosis Date   ETOH abuse    Gout     PAST SURGICAL HISTORY: Past Surgical History:  Procedure Laterality Date   FOOT SURGERY      FAMILY HISTORY: Family History  Problem Relation Age of Onset   COPD Father     SOCIAL HISTORY: Social History   Socioeconomic History   Marital status: Divorced    Spouse name: Not on file   Number of children: Not on file   Years of education: Not on file   Highest education level: Not on file  Occupational History   Not on file  Tobacco Use   Smoking status: Every Day    Current packs/day: 1.00    Types: Cigarettes   Smokeless tobacco: Never  Substance and Sexual Activity   Alcohol use: Yes    Alcohol/week: 42.0 standard drinks of alcohol    Types: 42 Standard drinks or equivalent per week    Comment: 5-6 "glasses of bourbon daily"   Drug use: No   Sexual activity: Not on file  Other Topics Concern   Not on file  Social History Narrative   Right Handed   No Caffeine    Social Determinants of Health   Financial Resource Strain: Not on file  Food Insecurity: No Food Insecurity (01/28/2023)   Hunger Vital Sign  Worried About Programme researcher, broadcasting/film/video in the Last Year: Never true    Ran Out of Food in the Last Year: Never true  Transportation Needs: No Transportation Needs (01/28/2023)   PRAPARE - Administrator, Civil Service (Medical): No    Lack of Transportation (Non-Medical): No  Physical Activity: Not on file  Stress: Not on file  Social Connections: Not on file  Intimate Partner Violence: Not At Risk (01/28/2023)   Humiliation, Afraid, Rape, and Kick questionnaire    Fear of Current or Ex-Partner: No    Emotionally Abused: No    Physically Abused: No    Sexually Abused: No      Levert Feinstein, M.D. Ph.D.  [facility] [address] [place], [place] [address] Ph: [phone] Fax: [phone]  CC:   Benita Stabile, MD [address] [place],  [place] [address]  Benita Stabile, MD

## 2023-03-25 NOTE — Procedures (Signed)
Full Name: Connor Copeland Gender: Male MRN #: [id number] Date of Birth: Nov 25, 1975    Visit Date: 03/19/2023 14:37 Age: 47 Years Examining Physician: Dr. Levert Feinstein Referring Physician: Dr. Epimenio Foot Height:  6 feet 2 inch History: 47 year old male presenting with gradual onset bilateral foot paresthesia since February, rapid extending of paresthesia to mid thigh and, gait abnormality, also began to have finger involvement since May 2024  Summary of test: Nerve conduction study: Bilateral sural, superficial peroneal sensory responses were absent or with significantly decreased snap amplitude.  Bilateral ulnar sensory responses showed mildly prolonged peak latency with normal snap amplitude.\  Right radial sensory response showed significantly prolonged peak latency, with significantly decreased snap amplitude.  Left median sensory response was absent.  Left radial sensory response was within normal limit  Bilateral tibial motor responses showed normal distal latency, CMAP amplitude, low normal range conduction velocity, with mildly prolonged F-wave latency  Left peroneal to EDB motor responses was absent.  Right peroneal to EDB motor response showed mildly prolonged distal latency, with mildly decreased CMAP amplitude, with mildly decreased conduction velocity.  Bilateral ulnar motor responses showed normal distal latency, CMAP amplitude, conduction velocity, but mild to moderately prolonged F-wave latency.  Right median motor responses showed mildly prolonged distal latency, normal conduction velocity, moderately prolonged F-wave latency  Left median motor responses showed normal distal latency, significantly decreased CMAP amplitude,  Electromyography: Selected needle examinations were performed at left lower extremity muscles, left lumbosacral paraspinals; left upper extremity and left cervical paraspinal muscles.    There is no evidence of active denervation, but there is  evidence of decreased recruitment patterns are multiple muscles tested, most noticeable at left lower extremity muscles and the left distal upper extremity muscles.  There was no spontaneous activity at left cervical and lumbosacral paraspinal muscles.  Conclusion: This is an abnormal study.  There is electrodiagnostic evidence of chronic demyelinating polyradiculoneuropathy based on evidence of multiple mild to moderately prolonged F-wave latency, neuropathic changes at needle examinations.    ------------------------------- Forrestine Him.D.Ph.D.  [facility] [address], [address] [place], [place] [address] Tel: [phone] Fax: [phone]  Verbal informed consent was obtained from the patient, patient was informed of potential risk of procedure, including bruising, bleeding, hematoma formation, infection, muscle weakness, muscle pain, numbness, among others.        MNC    Nerve / Sites Muscle Latency Ref. Amplitude Ref. Rel Amp Segments Distance Velocity Ref. Area    ms ms mV mV %  cm m/s m/s mVms  L Median - APB     Wrist APB 3.7 ?4.4 1.1 ?4.0 100 Wrist - APB 7   2.1     Upper arm APB 6.9  2.3  197 Upper arm - Wrist   ?49 4.0     Ulnar Wrist APB 11.0  1.9  83.5 Ulnar Wrist - APB    3.5  R Median - APB     Wrist APB 4.6 ?4.4 6.1 ?4.0 100 Wrist - APB 7   23.3     Upper arm APB 10.5  4.2  68.9 Upper arm - Wrist 29.6 50 ?49 16.7  L Ulnar - ADM     Wrist ADM 3.0 ?3.3 6.6 ?6.0 100 Wrist - ADM 7   24.1     B.Elbow ADM 5.7  6.1  93.4 B.Elbow - Wrist 14 51 ?49 20.9     A.Elbow ADM 9.5  6.4  104 A.Elbow -  B.Elbow 20 53 ?49 22.0  R Ulnar - ADM     Wrist ADM 2.9 ?3.3 7.6 ?6.0 100 Wrist - ADM 7   27.7     B.Elbow ADM 5.8  7.0  92.8 B.Elbow - Wrist 16 55 ?49 26.8     A.Elbow ADM 9.7  6.9  98.7 A.Elbow - B.Elbow 20 51 ?49 27.8  R Peroneal - EDB     Ankle EDB 6.7 ?6.5 1.4 ?2.0 100 Ankle - EDB 9   2.9     Fib head EDB 14.2  0.7  53.1 Fib head - Ankle 31 42 ?44 1.6      Pop fossa EDB 17.3  1.1  149 Pop fossa - Fib head 11.6 37 ?44 3.1         Pop fossa - Ankle      L Peroneal - EDB     Ankle EDB NR ?6.5 NR ?2.0 NR Ankle - EDB 9   NR         Pop fossa - Ankle      R Tibial - AH     Ankle AH 4.7 ?5.8 4.5 ?4.0 100 Ankle - AH 9   12.5     Pop fossa AH 15.2  3.1  67.7 Pop fossa - Ankle 40 38 ?41 11.7  L Tibial - AH     Ankle AH 4.9 ?5.8 4.1 ?4.0 100 Ankle - AH 9   13.7     Pop fossa AH 15.5  4.0  97.1 Pop fossa - Ankle 44 41 ?41 14.6                     SNC    Nerve / Sites Rec. Site Peak Lat Ref.  Amp Ref. Segments Distance Peak Diff Ref.    ms ms V V  cm ms ms  L Radial - Anatomical snuff box (Forearm)     Forearm Wrist 2.6 ?2.9 19 ?15 Forearm - Wrist 10    R Sural - Ankle (Calf)     Calf Ankle 3.9 ?4.4 2 ?6 Calf - Ankle 14    L Sural - Ankle (Calf)     Calf Ankle NR ?4.4 NR ?6 Calf - Ankle 14    R Superficial peroneal - Ankle     Lat leg Ankle NR ?4.4 NR ?6 Lat leg - Ankle 14    L Superficial peroneal - Ankle     Lat leg Ankle NR ?4.4 NR ?6 Lat leg - Ankle 14    R Median, Ulnar - Transcarpal comparison     Median Palm Wrist 3.4 ?2.2 24 ?35 Median Palm - Wrist 8       Ulnar Palm Wrist 2.4 ?2.2 10 ?12 Ulnar Palm - Wrist 8          Median Palm - Ulnar Palm  1.0 ?0.4  L Median - Orthodromic (Dig II, Mid palm)     Dig II Wrist NR ?3.4 NR ?10 Dig II - Wrist 13    R Median - Orthodromic (Dig II, Mid palm)     Dig II Wrist 4.4 ?3.4 4 ?10 Dig II - Wrist 13    L Ulnar - Orthodromic, (Dig V, Mid palm)     Dig V Wrist 3.4 ?3.1 7 ?5 Dig V - Wrist 11    R Ulnar - Orthodromic, (Dig V, Mid palm)     Dig V Wrist 3.6 ?3.1 6 ?5 Dig V - Wrist  69                           F  Wave    Nerve F Lat Ref.   ms ms  R Tibial - AH 59.9 ?56.0  L Tibial - AH 59.8 ?56.0  L Ulnar - ADM 33.9 ?32.0  R Ulnar - ADM 34.3 ?32.0  L Median - APB 34.9 ?31.0               EMG Summary Table    Spontaneous MUAP Recruitment  Muscle IA Fib PSW Fasc Other Amp Dur. Poly Pattern   L. Tibialis anterior Increased None None None _______ Increased Increased 1+ Reduced  L. Tibialis posterior Increased None None None _______ Increased Increased 1+ Reduced  L. Peroneus longus Normal None None None _______ Normal Normal Normal Reduced  L. Gastrocnemius (Medial head) Normal None None None _______ Normal Normal Normal Reduced  L. Vastus lateralis Normal None None None _______ Normal Normal Normal Reduced  L. First dorsal interosseous Normal None None None _______ Normal Normal Normal Reduced  L. Pronator teres Normal None None None _______ Normal Normal Normal Normal  L. Biceps brachii Normal None None None _______ Normal Normal Normal Normal  L. Deltoid Normal None None None _______ Normal Normal Normal Normal  L. Triceps brachii Normal None None None _______ Normal Normal Normal Normal  L. Extensor digitorum communis Increased None None None _______ Increased Increased 1+ Reduced  L. Cervical paraspinals Normal None None None _______ Normal Normal Normal Normal  L. Lumbar paraspinals (low) Normal None None None _______ Normal Normal Normal Normal  L. Lumbar paraspinals (mid) Normal None None None _______ Normal Normal Normal Normal

## 2023-04-04 NOTE — Discharge Instructions (Signed)

## 2023-04-08 ENCOUNTER — Ambulatory Visit
Admission: RE | Admit: 2023-04-08 | Discharge: 2023-04-08 | Disposition: A | Discharge status: Home / Self Care | Payer: 59 | Source: Ambulatory Visit | Attending: Neurology | Admitting: Neurology

## 2023-04-08 ENCOUNTER — Telehealth: Payer: Self-pay | Admitting: Neurology

## 2023-04-08 VITALS — BP 113/81 | HR 77

## 2023-04-08 DIAGNOSIS — G629 Polyneuropathy, unspecified: Secondary | ICD-10-CM

## 2023-04-08 DIAGNOSIS — M792 Neuralgia and neuritis, unspecified: Secondary | ICD-10-CM

## 2023-04-08 NOTE — Telephone Encounter (Signed)
I received a after-hours message from [facility] regarding STAT results.  I was able to connect with the representative, there are no critical or abnormal results, just results that they wanted [place] to know about (STAT or urgent). I reviewed his CSF results in the chart, thus far nothing abnormal.  We will continue to await final test results.

## 2023-04-09 ENCOUNTER — Other Ambulatory Visit: Payer: 59 | Admitting: *Deleted

## 2023-04-10 ENCOUNTER — Other Ambulatory Visit: Payer: 59 | Admitting: *Deleted

## 2023-04-16 ENCOUNTER — Other Ambulatory Visit: Payer: Self-pay | Admitting: Neurology

## 2023-04-16 ENCOUNTER — Telehealth: Payer: Self-pay | Admitting: Neurology

## 2023-04-16 MED ORDER — PREDNISONE 20 MG PO TABS: ORAL_TABLET | ORAL | 3 refills | Status: DC

## 2023-04-16 NOTE — Telephone Encounter (Signed)
The EMG/NCV was consistent with CIDP.  However, the spinal fluid showed normal protein.     He is still experiencing the same amount of numbness and pain as he was last month.  Therefore, I will empirically start him on steroids at about 1 mg/kg for 6 weeks and then slowly taper  I discussed this with him.  He also had a suspected alcohol withdrawal seizure in June.  He has an EEG next week.

## 2023-04-17 ENCOUNTER — Ambulatory Visit (HOSPITAL_COMMUNITY): Payer: 59 | Attending: Dermatology

## 2023-04-17 ENCOUNTER — Other Ambulatory Visit: Payer: Self-pay

## 2023-04-17 DIAGNOSIS — M79605 Pain in left leg: Secondary | ICD-10-CM | POA: Insufficient documentation

## 2023-04-17 DIAGNOSIS — R29898 Other symptoms and signs involving the musculoskeletal system: Secondary | ICD-10-CM | POA: Insufficient documentation

## 2023-04-17 DIAGNOSIS — M79604 Pain in right leg: Secondary | ICD-10-CM | POA: Insufficient documentation

## 2023-04-17 DIAGNOSIS — R262 Difficulty in walking, not elsewhere classified: Secondary | ICD-10-CM | POA: Diagnosis present

## 2023-04-17 NOTE — Therapy (Addendum)
OUTPATIENT PHYSICAL THERAPY NEURO EVALUATION   Patient Name: Connor Copeland MRN: [id number] DOB:1975/12/20, 47 y.o., male Today's Date: 04/17/2023   PCP: none on file REFERRING PROVIDER: Benita Stabile, MD  END OF SESSION:  PT End of Session - 04/17/23 1608     Visit Number 1    Number of Visits 24    Date for PT Re-Evaluation 07/10/23    Authorization Type [employer] (no auth, no limit, no copay)    Progress Note Due on Visit 8    PT Start Time 1430    PT Stop Time 1520    PT Time Calculation (min) 50 min    Equipment Utilized During Treatment Gait belt    Activity Tolerance Patient limited by pain;Patient limited by fatigue    Behavior During Therapy [facility] for tasks assessed/performed             Past Medical History:  Diagnosis Date   ETOH abuse    Gout    Past Surgical History:  Procedure Laterality Date   FOOT SURGERY     Patient Active Problem List   Diagnosis Date Noted   Seizure disorder (HCC) 03/19/2023   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   Neuropathic pain 03/19/2023   Delirium tremens (HCC) 01/29/2023   Alcohol withdrawal seizure (HCC) 01/28/2023   Alcohol abuse 01/28/2023   Tobacco use disorder 01/28/2023   Hyponatremia 01/28/2023   Elevated MCV 01/28/2023   Macrocytic anemia 01/28/2023   Elevated transaminase level 01/28/2023   Hyperbilirubinemia 01/28/2023   Gout 01/28/2023   HLD (hyperlipidemia) 12/11/2022   Hypertriglyceridemia 12/11/2022   Family history of coronary artery disease 12/11/2022   Peripheral neuropathy 12/11/2022   Leg swelling 12/11/2022   Hypoalbuminemia 12/11/2022    ONSET DATE: March 2024  REFERRING DIAG: G61.81 (ICD-10-CM) - Chronic inflammatory demyelinating polyneuritis  THERAPY DIAG:  Difficulty in walking, not elsewhere classified  Weakness of both lower extremities  Pain in both lower extremities  Rationale for Evaluation and Treatment:  Rehabilitation  SUBJECTIVE:                                                                                                                                                                                             SUBJECTIVE STATEMENT: Arrives to the clinic with c/o difficulty with walking, weakness on B LE (R>L) and some unsteadiness especially when standing with his eyes closed. Uses a rolling walker at times. Patient also reports that he gets tired easily and feels numb on B UE/LE. Patient also states that his grip is weak. Condition started March 2024 when patient had pains on  the hip without apparent reason  which gradually got worse until the pain extended to the feet and hands. Condition got worse in May 2024 where pain was so severe that patient cannot work anymore. Patient also had a seizure in June 2024 and was admitted to the hospital where patient had PT. Patient was D/C and had PT at home for 2 months. In July 2024, MD told him that he may have CIDP. Patient was given Prednisone 80 mg just recently and will be tapered down weekly. Patient was then referred to outpatient PT evaluation and management. Pt accompanied by: self  PERTINENT HISTORY: seizure disorder, gout  PAIN:  Are you having pain? Yes: NPRS scale: 9/10 Pain location: hands/legs Pain description: sharp, burning, throbbing Aggravating factors: laying in bed at night Relieving factors: change position and movement  PRECAUTIONS: Fall  RED FLAGS: Bowel or bladder incontinence: No and Spinal tumors: No   WEIGHT BEARING RESTRICTIONS: No  FALLS: Has patient fallen in last 6 months? Yes. Number of falls 4 (last was in June 2024)  LIVING ENVIRONMENT: Lives with:  lives with mother Lives in: House/apartment Stairs: Yes: Internal: 14 steps; on right going up and External: 4 steps; bilateral but cannot reach both Has following equipment at home: Dan Humphreys - 2 wheeled  PLOF: Independent, Independent with basic ADLs, and  used to be a shift leader at [employer]  PATIENT GOALS: "try to get my balance so I go back to work"  OBJECTIVE:   DIAGNOSTIC FINDINGS:  NEUROIMAGING REPORT   STUDY DATE: 02/05/2023 ... Impression This MRI of the cervical spine without contrast shows the  following:  The spinal cord appears normal.   There is no critical spinal stenosis.  At C6-C7 there are degenerative changes combining to cause mild spinal  stenosis and left greater than right foraminal narrowing.  Although there  is no definite nerve root compression the degenerative changes encroach  upon the C7 nerve roots.  Milder degenerative changes at the other cervical levels as detailed above  do not lead to spinal stenosis or nerve root compression. ...  NEUROIMAGING REPORT   STUDY DATE: 02/05/2023 ... Impression This MRI of the lumbar spine without contrast shows the  following:  The terminal spinal cord and the conus medullary medullaris appear normal.  Degenerative changes at L3-L4, L4-L5 and L5-S1 are noted but do not lead  to spinal stenosis or nerve root compression.  COGNITION: Overall cognitive status: Within functional limits for tasks assessed   SENSATION: Light touch: Impaired  on B LE  MUSCLE LENGTH: Hamstrings: mild restriction on B Gastrocnemius: mild restriction on B  POSTURE: rounded shoulders, forward head, decreased lumbar lordosis, and knees slightly flexed (standing)  LOWER EXTREMITY ROM:     Active  Right Eval Left Eval  Hip flexion [facility]  Hip extension    Hip abduction [facility]  Hip adduction [facility]  Hip internal rotation    Hip external rotation    Knee flexion [facility]  Knee extension [facility]  Ankle dorsiflexion [facility]  Ankle plantarflexion [facility]  Ankle inversion    Ankle eversion     (Blank rows = not tested)  LOWER EXTREMITY MMT:    MMT Right Eval Left Eval  Hip flexion 3+ 3+  Hip extension    Hip abduction 4- 3+  Hip adduction 4- 4-  Hip internal  rotation    Hip external rotation    Knee flexion 3+ 3+  Knee extension  3+ 3+  Ankle dorsiflexion 3+ 3+  Ankle plantarflexion 3+ 3+  Ankle inversion    Ankle eversion    (Blank rows = not tested)  TRANSFERS: Assistive device utilized: None  Sit to stand: SBA Stand to sit: SBA  STAIRS: Level of Assistance: SBA Stair Negotiation Technique: Step to Pattern with Single Rail on Right Number of Stairs: 4  Height of Stairs: 7"  Comments: Wide BOS, slightly staggering, knees slightly flexed, slightly unsteady  GAIT: Gait pattern: decreased arm swing- Right, decreased arm swing- Left, decreased step length- Right, decreased step length- Left, decreased stride length, decreased ankle dorsiflexion- Right, decreased ankle dorsiflexion- Left, knee flexed in stance- Right, knee flexed in stance- Left, decreased trunk rotation, trunk flexed, wide BOS, poor foot clearance- Right, and poor foot clearance- Left Distance walked: 140 ft Assistive device utilized: None Level of assistance: CGA Comments: mildly unsteady  FUNCTIONAL TESTS:  5 times sit to stand: 34.43 sec 2 minute walk test: 140 ft Dynamic Gait Index: 8  DGI 1. Gait level surface (1) Moderate Impairment: Walks 20', slow speed, abnormal gait pattern, evidence for imbalance. 2. Change in gait speed (1) Moderate Impairment: Makes only minor adjustments to walking speed, or accomplishes a change in speed with significant gait deviations, or changes speed but has significant gait deviations, or changes speed but loses balance but is able to recover and continue walking. 3. Gait with horizontal head turns (1) Moderate Impairment: Performs head turns with moderate change in gait velocity, slows down, staggers but recovers, can continue to walk. 4. Gait with vertical head turns 1) Moderate Impairment: Performs head turns with moderate change in gait velocity, slows down, staggers but recovers, can continue to walk. 5. Gait and pivot  turn (1) Moderate Impairment: Turns slowly, requires verbal cueing, requires several small steps to catch balance following turn and stop. 6. Step over obstacle (1) Moderate Impairment: Is able to step over box but must stop, then step over. May require verbal cueing. 7. Step around obstacles (1) Moderate Impairment: Is able to clear cones but must significantly slow, speed to accomplish task, or requires verbal cueing. 8. Stairs (1) Moderate Impairment: Two feet to a stair, must use rail.  TOTAL SCORE: 8 / 24  PATIENT SURVEYS:  LEFS 6/80 = 7.5%  TODAY'S TREATMENT:                                                                                                                              DATE:  04/17/23 Evaluation and patient education done Gait training with a quad cane with CGA for around 80 ft    PATIENT EDUCATION: Education details: Educated on the pathoanatomy of CIDP. Educated on the goals and course of rehab. Educated patient measures to reduce falls at home. on falls reduction at home Person educated: Patient Education method: Explanation Education comprehension: verbalized understanding  HOME EXERCISE PROGRAM: None provided to date  GOALS: Goals reviewed with patient? Yes  SHORT TERM GOALS: Target date: 05/15/23  Pt will demonstrate indep in HEP to facilitate carry-over of skilled services and improve functional outcomes Goal status: INITIAL  2.  Pt will decrease 5TSTS by at least 3 seconds in order to demonstrate clinically significant improvement in LE strength   Baseline: 34.43 sec Goal status: INITIAL  3.  Pt will increase by at least 40 ft in order to demonstrate clinically significant improvement in community ambulation Baseline: 140 ft Goal status: INITIAL  4.  Pt will increase LEFS by at least 9 points in order to demonstrate significant improvement in lower extremity function.  Baseline: 6 Goal status: INITIAL  LONG TERM GOALS: Target date:  07/10/23  Pt will decrease 5TSTS by at least 6 seconds in order to demonstrate clinically significant improvement in LE strength   Baseline: 34.43 sec Goal status: INITIAL  2.  Pt will increase by at least 80 ft in order to demonstrate clinically significant improvement in community ambulation Baseline: 140 ft Goal status: INITIAL  3.  Pt will increase LEFS by at least 18 points in order to demonstrate significant improvement in lower extremity function.  Baseline: 6 Goal status: INITIAL  4.  Pt will demonstrate increase in LE strength to 4 to facilitate ease and safety in ambulation  Baseline: 3+ Goal status: INITIAL  5.  Pt will improve DGI by at least 3 points in order to demonstrate clinically significant improvement in balance and decreased risk for falls  Baseline: 8 Goal status: INITIAL  ASSESSMENT:  CLINICAL IMPRESSION: Patient is a 47 y.o. male who was seen today for physical therapy evaluation and treatment for Chronic Inflammatory Demyelinating Polyneuritis (CIDP). Patient was diagnosed with CIDP by referring provider further defined by difficulty with ambulation and negotiating stairs due to pain, weakness, impaired balance/proprioception and decreased soft tissue extensibility. Skilled PT is required to address the impairments and functional limitations listed below. Due to weakness in grip, patient may also need OT to address patient's issues with ADLs.Interventions today were geared towards gait training for safety. Tolerated all activities with improved confidence with safety when ambulating. May use a quad cane as needed. Demonstrated mild levels of fatigue. Rest periods provided. Provided slight amount of cueing to ensure correct execution of activity with good carry-over.      OBJECTIVE IMPAIRMENTS: Abnormal gait, decreased activity tolerance, decreased balance, decreased endurance, decreased mobility, difficulty walking, decreased strength, impaired flexibility,  impaired sensation, and pain.   ACTIVITY LIMITATIONS: carrying, lifting, bending, sitting, standing, squatting, stairs, transfers, bed mobility, continence, bathing, toileting, dressing, self feeding, reach over head, hygiene/grooming, locomotion level, and caring for others  PARTICIPATION LIMITATIONS: meal prep, cleaning, laundry, driving, shopping, community activity, occupation, and yard work  PERSONAL FACTORS: Time since onset of injury/illness/exacerbation and nature of the condition  are also affecting patient's functional outcome.   REHAB POTENTIAL: Fair    CLINICAL DECISION MAKING: Evolving/moderate complexity  EVALUATION COMPLEXITY: Moderate  PLAN:  PT FREQUENCY: 2x/week  PT DURATION: 12 weeks  PLANNED INTERVENTIONS: Therapeutic exercises, Therapeutic activity, Neuromuscular re-education, Balance training, Gait training, Patient/Family education, Self Care, Stair training, Orthotic/Fit training, Electrical stimulation, Manual therapy, and Re-evaluation  PLAN FOR NEXT SESSION: Provide HEP. Begin LE flexibility, functional strengthening, balance and gait activities.   Tish Frederickson. Nysha Koplin, PT, DPT, OCS Board-Certified Clinical Specialist in Orthopedic PT PT Compact Privilege # ([place]): [id number] T 04/17/2023, 4:13 PM

## 2023-04-18 NOTE — Addendum Note (Signed)
Addended by: Trudee Grip on: 04/18/2023 07:05 AM   Modules accepted: Orders

## 2023-04-23 ENCOUNTER — Ambulatory Visit (INDEPENDENT_AMBULATORY_CARE_PROVIDER_SITE_OTHER): Payer: 59 | Admitting: Neurology

## 2023-04-23 DIAGNOSIS — M792 Neuralgia and neuritis, unspecified: Secondary | ICD-10-CM

## 2023-04-23 DIAGNOSIS — G6181 Chronic inflammatory demyelinating polyneuritis: Secondary | ICD-10-CM

## 2023-04-23 DIAGNOSIS — G40909 Epilepsy, unspecified, not intractable, without status epilepticus: Secondary | ICD-10-CM | POA: Diagnosis not present

## 2023-04-23 DIAGNOSIS — G629 Polyneuropathy, unspecified: Secondary | ICD-10-CM

## 2023-04-24 ENCOUNTER — Ambulatory Visit (HOSPITAL_COMMUNITY): Payer: 59

## 2023-04-24 DIAGNOSIS — M79604 Pain in right leg: Secondary | ICD-10-CM

## 2023-04-24 DIAGNOSIS — R262 Difficulty in walking, not elsewhere classified: Secondary | ICD-10-CM | POA: Diagnosis not present

## 2023-04-24 DIAGNOSIS — R29898 Other symptoms and signs involving the musculoskeletal system: Secondary | ICD-10-CM

## 2023-04-24 NOTE — Therapy (Signed)
OUTPATIENT PHYSICAL THERAPY NEURO TREATMENT   Patient Name: Connor Copeland MRN: [id number] DOB:01/24/1976, 47 y.o., male Today's Date: 04/24/2023   PCP: none on file REFERRING PROVIDER: Benita Stabile, MD  END OF SESSION:  PT End of Session - 04/24/23 0806     Visit Number 2    Number of Visits 24    Date for PT Re-Evaluation 07/10/23    Authorization Type [employer] (no auth, no limit, no copay)    Progress Note Due on Visit 8    PT Start Time 0800    PT Stop Time 0840    PT Time Calculation (min) 40 min    Activity Tolerance Patient limited by pain;Patient limited by fatigue    Behavior During Therapy [facility] for tasks assessed/performed              Past Medical History:  Diagnosis Date   ETOH abuse    Gout    Past Surgical History:  Procedure Laterality Date   FOOT SURGERY     Patient Active Problem List   Diagnosis Date Noted   Seizure disorder (HCC) 03/19/2023   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   Neuropathic pain 03/19/2023   Delirium tremens (HCC) 01/29/2023   Alcohol withdrawal seizure (HCC) 01/28/2023   Alcohol abuse 01/28/2023   Tobacco use disorder 01/28/2023   Hyponatremia 01/28/2023   Elevated MCV 01/28/2023   Macrocytic anemia 01/28/2023   Elevated transaminase level 01/28/2023   Hyperbilirubinemia 01/28/2023   Gout 01/28/2023   HLD (hyperlipidemia) 12/11/2022   Hypertriglyceridemia 12/11/2022   Family history of coronary artery disease 12/11/2022   Peripheral neuropathy 12/11/2022   Leg swelling 12/11/2022   Hypoalbuminemia 12/11/2022    ONSET DATE: March 2024  REFERRING DIAG: G61.81 (ICD-10-CM) - Chronic inflammatory demyelinating polyneuritis  THERAPY DIAG:  Difficulty in walking, not elsewhere classified  Weakness of both lower extremities  Pain in both lower extremities  Rationale for Evaluation and Treatment: Rehabilitation  SUBJECTIVE:                                                                                                                                                                                              SUBJECTIVE STATEMENT: Currently reports of pain on the B LE = 9/10. Denies recent falls. States that he's been using the rolling walker at home and has not gotten the cane yet.   EVAL: Arrives to the clinic with c/o difficulty with walking, weakness on B LE (R>L) and some unsteadiness especially when standing with his eyes closed. Uses a rolling walker at times. Patient also reports that he gets  tired easily and feels numb on B UE/LE. Patient also states that his grip is weak. Condition started March 2024 when patient had pains on the hip without apparent reason  which gradually got worse until the pain extended to the feet and hands. Condition got worse in May 2024 where pain was so severe that patient cannot work anymore. Patient also had a seizure in June 2024 and was admitted to the hospital where patient had PT. Patient was D/C and had PT at home for 2 months. In July 2024, MD told him that he may have CIDP. Patient was given Prednisone 80 mg just recently and will be tapered down weekly. Patient was then referred to outpatient PT evaluation and management. Pt accompanied by: self  PERTINENT HISTORY: seizure disorder, gout  PAIN:  Are you having pain? Yes: NPRS scale: 9/10 Pain location: hands/legs Pain description: sharp, burning, throbbing Aggravating factors: laying in bed at night Relieving factors: change position and movement  PRECAUTIONS: Fall  RED FLAGS: Bowel or bladder incontinence: No and Spinal tumors: No   WEIGHT BEARING RESTRICTIONS: No  FALLS: Has patient fallen in last 6 months? Yes. Number of falls 4 (last was in June 2024)  LIVING ENVIRONMENT: Lives with:  lives with mother Lives in: House/apartment Stairs: Yes: Internal: 14 steps; on right going up and External: 4 steps; bilateral but cannot reach both Has  following equipment at home: Dan Humphreys - 2 wheeled  PLOF: Independent, Independent with basic ADLs, and used to be a shift leader at [employer]  PATIENT GOALS: "try to get my balance so I go back to work"  OBJECTIVE:   DIAGNOSTIC FINDINGS:  NEUROIMAGING REPORT   STUDY DATE: 02/05/2023 ... Impression This MRI of the cervical spine without contrast shows the  following:  The spinal cord appears normal.   There is no critical spinal stenosis.  At C6-C7 there are degenerative changes combining to cause mild spinal  stenosis and left greater than right foraminal narrowing.  Although there  is no definite nerve root compression the degenerative changes encroach  upon the C7 nerve roots.  Milder degenerative changes at the other cervical levels as detailed above  do not lead to spinal stenosis or nerve root compression. ...  NEUROIMAGING REPORT   STUDY DATE: 02/05/2023 ... Impression This MRI of the lumbar spine without contrast shows the  following:  The terminal spinal cord and the conus medullary medullaris appear normal.  Degenerative changes at L3-L4, L4-L5 and L5-S1 are noted but do not lead  to spinal stenosis or nerve root compression.  COGNITION: Overall cognitive status: Within functional limits for tasks assessed   SENSATION: Light touch: Impaired  on B LE  MUSCLE LENGTH: Hamstrings: mild restriction on B Gastrocnemius: mild restriction on B  POSTURE: rounded shoulders, forward head, decreased lumbar lordosis, and knees slightly flexed (standing)  LOWER EXTREMITY ROM:     Active  Right Eval Left Eval  Hip flexion [facility]  Hip extension    Hip abduction [facility]  Hip adduction [facility]  Hip internal rotation    Hip external rotation    Knee flexion [facility]  Knee extension [facility]  Ankle dorsiflexion [facility]  Ankle plantarflexion [facility]  Ankle inversion    Ankle eversion     (Blank rows = not tested)  LOWER EXTREMITY MMT:    MMT Right Eval Left Eval   Hip flexion 3+ 3+  Hip extension    Hip abduction 4- 3+  Hip adduction 4- 4-  Hip internal rotation    Hip external rotation    Knee flexion 3+ 3+  Knee extension 3+ 3+  Ankle dorsiflexion 3+ 3+  Ankle plantarflexion 3+ 3+  Ankle inversion    Ankle eversion    (Blank rows = not tested)  TRANSFERS: Assistive device utilized: None  Sit to stand: SBA Stand to sit: SBA  STAIRS: Level of Assistance: SBA Stair Negotiation Technique: Step to Pattern with Single Rail on Right Number of Stairs: 4  Height of Stairs: 7"  Comments: Wide BOS, slightly staggering, knees slightly flexed, slightly unsteady  GAIT: Gait pattern: decreased arm swing- Right, decreased arm swing- Left, decreased step length- Right, decreased step length- Left, decreased stride length, decreased ankle dorsiflexion- Right, decreased ankle dorsiflexion- Left, knee flexed in stance- Right, knee flexed in stance- Left, decreased trunk rotation, trunk flexed, wide BOS, poor foot clearance- Right, and poor foot clearance- Left Distance walked: 140 ft Assistive device utilized: None Level of assistance: CGA Comments: mildly unsteady  FUNCTIONAL TESTS:  5 times sit to stand: 34.43 sec 2 minute walk test: 140 ft Dynamic Gait Index: 8  DGI 1. Gait level surface (1) Moderate Impairment: Walks 20', slow speed, abnormal gait pattern, evidence for imbalance. 2. Change in gait speed (1) Moderate Impairment: Makes only minor adjustments to walking speed, or accomplishes a change in speed with significant gait deviations, or changes speed but has significant gait deviations, or changes speed but loses balance but is able to recover and continue walking. 3. Gait with horizontal head turns (1) Moderate Impairment: Performs head turns with moderate change in gait velocity, slows down, staggers but recovers, can continue to walk. 4. Gait with vertical head turns 1) Moderate Impairment: Performs head turns with moderate change  in gait velocity, slows down, staggers but recovers, can continue to walk. 5. Gait and pivot turn (1) Moderate Impairment: Turns slowly, requires verbal cueing, requires several small steps to catch balance following turn and stop. 6. Step over obstacle (1) Moderate Impairment: Is able to step over box but must stop, then step over. May require verbal cueing. 7. Step around obstacles (1) Moderate Impairment: Is able to clear cones but must significantly slow, speed to accomplish task, or requires verbal cueing. 8. Stairs (1) Moderate Impairment: Two feet to a stair, must use rail.  TOTAL SCORE: 8 / 24  PATIENT SURVEYS:  LEFS 6/80 = 7.5%  TODAY'S TREATMENT:                                                                                                                              DATE:  04/24/23 Recumbent stepper, arm 8, seat 13, level 1, > 60 SPM Seated Gastrocsoleus stretch with a strap x 30" x 3 Standing:  Mini squats x 3" x 10 x 2  Hip vectors, RTB x 10 x 2  Heel/toe raises x 10 x 2  TKE, RTB x 10 x 2  x 3"  04/17/23 Evaluation and patient education done Gait training with a quad cane with CGA for around 80 ft    PATIENT EDUCATION: Education details: Educated on the pathoanatomy of CIDP. Educated on the goals and course of rehab. Educated patient measures to reduce falls at home. on falls reduction at home Person educated: Patient Education method: Explanation Education comprehension: verbalized understanding  HOME EXERCISE PROGRAM: Access Code: [email or web address] URL: [email or web address] Date: 04/24/2023 Prepared by: Krystal Clark  Exercises - Seated Calf Stretch with Strap  - 1-2 x daily - 7 x weekly - 3 reps - 30 hold - Mini Squat with Counter Support  - 1-2 x daily - 7 x weekly - 2 sets - 10 reps - 3 hold - Standing 3-Way Leg Reach with Resistance at Ankles and Counter Support  - 1-2 x daily - 7 x weekly - 2 sets - 10 reps - Heel Toe Raises with Counter  Support  - 1-2 x daily - 7 x weekly - 2 sets - 10 reps  GOALS: Goals reviewed with patient? Yes  SHORT TERM GOALS: Target date: 05/15/23  Pt will demonstrate indep in HEP to facilitate carry-over of skilled services and improve functional outcomes Goal status: INITIAL  2.  Pt will decrease 5TSTS by at least 3 seconds in order to demonstrate clinically significant improvement in LE strength   Baseline: 34.43 sec Goal status: INITIAL  3.  Pt will increase by at least 40 ft in order to demonstrate clinically significant improvement in community ambulation Baseline: 140 ft Goal status: INITIAL  4.  Pt will increase LEFS by at least 9 points in order to demonstrate significant improvement in lower extremity function.  Baseline: 6 Goal status: INITIAL  LONG TERM GOALS: Target date: 07/10/23  Pt will decrease 5TSTS by at least 6 seconds in order to demonstrate clinically significant improvement in LE strength   Baseline: 34.43 sec Goal status: INITIAL  2.  Pt will increase by at least 80 ft in order to demonstrate clinically significant improvement in community ambulation Baseline: 140 ft Goal status: INITIAL  3.  Pt will increase LEFS by at least 18 points in order to demonstrate significant improvement in lower extremity function.  Baseline: 6 Goal status: INITIAL  4.  Pt will demonstrate increase in LE strength to 4 to facilitate ease and safety in ambulation  Baseline: 3+ Goal status: INITIAL  5.  Pt will improve DGI by at least 3 points in order to demonstrate clinically significant improvement in balance and decreased risk for falls  Baseline: 8 Goal status: INITIAL  ASSESSMENT:  CLINICAL IMPRESSION: Interventions today were geared towards LE strengthening and flexibility. Tolerated all activities without worsening of symptoms. Demonstrated mild to moderate levels of fatigue. Pacing of activities was slow. Rest periods given. Provided slight amount of cueing to  ensure correct execution of activity with good carry-over. To date, skilled PT is required to address the impairments and improve function.  EVAL: Patient is a 47 y.o. male who was seen today for physical therapy evaluation and treatment for Chronic Inflammatory Demyelinating Polyneuritis (CIDP). Patient was diagnosed with CIDP by referring provider further defined by difficulty with ambulation and negotiating stairs due to pain, weakness, impaired balance/proprioception and decreased soft tissue extensibility. Skilled PT is required to address the impairments and functional limitations listed below. Due to weakness in grip, patient may also need OT to address patient's issues with ADLs.Interventions today were geared towards gait training for safety. Tolerated all  activities with improved confidence with safety when ambulating. May use a quad cane as needed. Demonstrated mild levels of fatigue. Rest periods provided. Provided slight amount of cueing to ensure correct execution of activity with good carry-over.      OBJECTIVE IMPAIRMENTS: Abnormal gait, decreased activity tolerance, decreased balance, decreased endurance, decreased mobility, difficulty walking, decreased strength, impaired flexibility, impaired sensation, and pain.   ACTIVITY LIMITATIONS: carrying, lifting, bending, sitting, standing, squatting, stairs, transfers, bed mobility, continence, bathing, toileting, dressing, self feeding, reach over head, hygiene/grooming, locomotion level, and caring for others  PARTICIPATION LIMITATIONS: meal prep, cleaning, laundry, driving, shopping, community activity, occupation, and yard work  PERSONAL FACTORS: Time since onset of injury/illness/exacerbation and nature of the condition  are also affecting patient's functional outcome.   REHAB POTENTIAL: Fair    CLINICAL DECISION MAKING: Evolving/moderate complexity  EVALUATION COMPLEXITY: Moderate  PLAN:  PT FREQUENCY: 2x/week  PT DURATION:  12 weeks  PLANNED INTERVENTIONS: Therapeutic exercises, Therapeutic activity, Neuromuscular re-education, Balance training, Gait training, Patient/Family education, Self Care, Stair training, Orthotic/Fit training, Electrical stimulation, Manual therapy, and Re-evaluation  PLAN FOR NEXT SESSION: Continue POC and may progress as tolerated with emphasis on LE flexibility, functional strengthening, balance and gait activities.   Tish Frederickson. Sandra Tellefsen, PT, DPT, OCS Board-Certified Clinical Specialist in Orthopedic PT PT Compact Privilege # ([place]): [id number] T 04/24/2023, 8:11 AM

## 2023-04-25 ENCOUNTER — Ambulatory Visit (HOSPITAL_COMMUNITY): Payer: 59

## 2023-04-25 DIAGNOSIS — R262 Difficulty in walking, not elsewhere classified: Secondary | ICD-10-CM | POA: Diagnosis not present

## 2023-04-25 DIAGNOSIS — M79604 Pain in right leg: Secondary | ICD-10-CM

## 2023-04-25 DIAGNOSIS — R29898 Other symptoms and signs involving the musculoskeletal system: Secondary | ICD-10-CM

## 2023-04-25 NOTE — Therapy (Addendum)
OUTPATIENT PHYSICAL THERAPY NEURO TREATMENT   Patient Name: Connor Copeland MRN: [id number] DOB:1976-06-20, 47 y.o., male Today's Date: 04/25/2023   PCP: none on file REFERRING PROVIDER: Benita Stabile, MD  END OF SESSION:  PT End of Session - 04/25/23 0809     Visit Number 3    Number of Visits 24    Date for PT Re-Evaluation 07/10/23    Authorization Type [employer] (no auth, no limit, no copay)    Progress Note Due on Visit 8    PT Start Time 0800    PT Stop Time 0840    PT Time Calculation (min) 40 min    Equipment Utilized During Treatment Gait belt    Activity Tolerance Patient limited by pain;Patient limited by fatigue    Behavior During Therapy [facility] for tasks assessed/performed               Past Medical History:  Diagnosis Date   ETOH abuse    Gout    Past Surgical History:  Procedure Laterality Date   FOOT SURGERY     Patient Active Problem List   Diagnosis Date Noted   Seizure disorder (HCC) 03/19/2023   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   Neuropathic pain 03/19/2023   Delirium tremens (HCC) 01/29/2023   Alcohol withdrawal seizure (HCC) 01/28/2023   Alcohol abuse 01/28/2023   Tobacco use disorder 01/28/2023   Hyponatremia 01/28/2023   Elevated MCV 01/28/2023   Macrocytic anemia 01/28/2023   Elevated transaminase level 01/28/2023   Hyperbilirubinemia 01/28/2023   Gout 01/28/2023   HLD (hyperlipidemia) 12/11/2022   Hypertriglyceridemia 12/11/2022   Family history of coronary artery disease 12/11/2022   Peripheral neuropathy 12/11/2022   Leg swelling 12/11/2022   Hypoalbuminemia 12/11/2022    ONSET DATE: March 2024  REFERRING DIAG: G61.81 (ICD-10-CM) - Chronic inflammatory demyelinating polyneuritis  THERAPY DIAG:  Difficulty in walking, not elsewhere classified  Weakness of both lower extremities  Pain in both lower extremities  Rationale for Evaluation and Treatment:  Rehabilitation  SUBJECTIVE:                                                                                                                                                                                             SUBJECTIVE STATEMENT: Still reports of pain = 9/10 on the legs. States that the session yesterday did not make him sore.  EVAL: Arrives to the clinic with c/o difficulty with walking, weakness on B LE (R>L) and some unsteadiness especially when standing with his eyes closed. Uses a rolling walker at times. Patient also reports that he gets tired easily  and feels numb on B UE/LE. Patient also states that his grip is weak. Condition started March 2024 when patient had pains on the hip without apparent reason  which gradually got worse until the pain extended to the feet and hands. Condition got worse in May 2024 where pain was so severe that patient cannot work anymore. Patient also had a seizure in June 2024 and was admitted to the hospital where patient had PT. Patient was D/C and had PT at home for 2 months. In July 2024, MD told him that he may have CIDP. Patient was given Prednisone 80 mg just recently and will be tapered down weekly. Patient was then referred to outpatient PT evaluation and management. Pt accompanied by: self  PERTINENT HISTORY: seizure disorder, gout  PAIN:  Are you having pain? Yes: NPRS scale: 9/10 Pain location: hands/legs Pain description: sharp, burning, throbbing Aggravating factors: laying in bed at night Relieving factors: change position and movement  PRECAUTIONS: Fall  RED FLAGS: Bowel or bladder incontinence: No and Spinal tumors: No   WEIGHT BEARING RESTRICTIONS: No  FALLS: Has patient fallen in last 6 months? Yes. Number of falls 4 (last was in June 2024)  LIVING ENVIRONMENT: Lives with:  lives with mother Lives in: House/apartment Stairs: Yes: Internal: 14 steps; on right going up and External: 4 steps; bilateral but cannot reach  both Has following equipment at home: Dan Humphreys - 2 wheeled  PLOF: Independent, Independent with basic ADLs, and used to be a shift leader at [employer]  PATIENT GOALS: "try to get my balance so I go back to work"  OBJECTIVE:   DIAGNOSTIC FINDINGS:  NEUROIMAGING REPORT   STUDY DATE: 02/05/2023 ... Impression This MRI of the cervical spine without contrast shows the  following:  The spinal cord appears normal.   There is no critical spinal stenosis.  At C6-C7 there are degenerative changes combining to cause mild spinal  stenosis and left greater than right foraminal narrowing.  Although there  is no definite nerve root compression the degenerative changes encroach  upon the C7 nerve roots.  Milder degenerative changes at the other cervical levels as detailed above  do not lead to spinal stenosis or nerve root compression. ...  NEUROIMAGING REPORT   STUDY DATE: 02/05/2023 ... Impression This MRI of the lumbar spine without contrast shows the  following:  The terminal spinal cord and the conus medullary medullaris appear normal.  Degenerative changes at L3-L4, L4-L5 and L5-S1 are noted but do not lead  to spinal stenosis or nerve root compression.  COGNITION: Overall cognitive status: Within functional limits for tasks assessed   SENSATION: Light touch: Impaired  on B LE  MUSCLE LENGTH: Hamstrings: mild restriction on B Gastrocnemius: mild restriction on B  POSTURE: rounded shoulders, forward head, decreased lumbar lordosis, and knees slightly flexed (standing)  LOWER EXTREMITY ROM:     Active  Right Eval Left Eval  Hip flexion [facility]  Hip extension    Hip abduction [facility]  Hip adduction [facility]  Hip internal rotation    Hip external rotation    Knee flexion [facility]  Knee extension [facility]  Ankle dorsiflexion [facility]  Ankle plantarflexion [facility]  Ankle inversion    Ankle eversion     (Blank rows = not tested)  LOWER EXTREMITY MMT:    MMT Right Eval  Left Eval  Hip flexion 3+ 3+  Hip extension    Hip abduction 4- 3+  Hip  adduction 4- 4-  Hip internal rotation    Hip external rotation    Knee flexion 3+ 3+  Knee extension 3+ 3+  Ankle dorsiflexion 3+ 3+  Ankle plantarflexion 3+ 3+  Ankle inversion    Ankle eversion    (Blank rows = not tested)  TRANSFERS: Assistive device utilized: None  Sit to stand: SBA Stand to sit: SBA  STAIRS: Level of Assistance: SBA Stair Negotiation Technique: Step to Pattern with Single Rail on Right Number of Stairs: 4  Height of Stairs: 7"  Comments: Wide BOS, slightly staggering, knees slightly flexed, slightly unsteady  GAIT: Gait pattern: decreased arm swing- Right, decreased arm swing- Left, decreased step length- Right, decreased step length- Left, decreased stride length, decreased ankle dorsiflexion- Right, decreased ankle dorsiflexion- Left, knee flexed in stance- Right, knee flexed in stance- Left, decreased trunk rotation, trunk flexed, wide BOS, poor foot clearance- Right, and poor foot clearance- Left Distance walked: 140 ft Assistive device utilized: None Level of assistance: CGA Comments: mildly unsteady  FUNCTIONAL TESTS:  5 times sit to stand: 34.43 sec 2 minute walk test: 140 ft Dynamic Gait Index: 8  DGI 1. Gait level surface (1) Moderate Impairment: Walks 20', slow speed, abnormal gait pattern, evidence for imbalance. 2. Change in gait speed (1) Moderate Impairment: Makes only minor adjustments to walking speed, or accomplishes a change in speed with significant gait deviations, or changes speed but has significant gait deviations, or changes speed but loses balance but is able to recover and continue walking. 3. Gait with horizontal head turns (1) Moderate Impairment: Performs head turns with moderate change in gait velocity, slows down, staggers but recovers, can continue to walk. 4. Gait with vertical head turns 1) Moderate Impairment: Performs head turns with  moderate change in gait velocity, slows down, staggers but recovers, can continue to walk. 5. Gait and pivot turn (1) Moderate Impairment: Turns slowly, requires verbal cueing, requires several small steps to catch balance following turn and stop. 6. Step over obstacle (1) Moderate Impairment: Is able to step over box but must stop, then step over. May require verbal cueing. 7. Step around obstacles (1) Moderate Impairment: Is able to clear cones but must significantly slow, speed to accomplish task, or requires verbal cueing. 8. Stairs (1) Moderate Impairment: Two feet to a stair, must use rail.  TOTAL SCORE: 8 / 24  PATIENT SURVEYS:  LEFS 6/80 = 7.5%  TODAY'S TREATMENT:                                                                                                                              DATE:  04/25/23 Recumbent stepper, arm 8, seat 13, level 1, > 60 SPM x 5'  Gastrocsoleus slant board stretch x 30" x 3 Standing:  Rhythmic stabilization, firm surface, normal BOS, eyes open x ant/post x 3" x 10 x 2  Yellow med ball raises, firm surface, normal, BOS, eyes open, x 10  Tandem stance, firm surface, eyes open x 30" x 2  TKE, RTB x 10 x 2 x 3"  Resisted walking backwards with bodycraft, 2 plates x 5 rounds  04/24/23 Recumbent stepper, arm 8, seat 13, level 1, > 60 SPM x 5' Seated Gastrocsoleus stretch with a strap x 30" x 3 Standing:  Mini squats x 3" x 10 x 2  Hip vectors, RTB x 10 x 2  Heel/toe raises x 10 x 2  TKE, RTB x 10 x 2 x 3"  04/17/23 Evaluation and patient education done Gait training with a quad cane with CGA for around 80 ft    PATIENT EDUCATION: Education details: Educated on the pathoanatomy of CIDP. Educated on the goals and course of rehab. Educated patient measures to reduce falls at home. on falls reduction at home Person educated: Patient Education method: Explanation Education comprehension: verbalized understanding  HOME EXERCISE PROGRAM: Access  Code: [email or web address] URL: [email or web address] 04/25/2023 - Tandem Stance with Support  - 1-2 x daily - 7 x weekly - 2 reps - 30 hold  Date: 04/24/2023 Prepared by: Krystal Clark  Exercises - Seated Calf Stretch with Strap  - 1-2 x daily - 7 x weekly - 3 reps - 30 hold - Mini Squat with Counter Support  - 1-2 x daily - 7 x weekly - 2 sets - 10 reps - 3 hold - Standing 3-Way Leg Reach with Resistance at Ankles and Counter Support  - 1-2 x daily - 7 x weekly - 2 sets - 10 reps - Heel Toe Raises with Counter Support  - 1-2 x daily - 7 x weekly - 2 sets - 10 reps  GOALS: Goals reviewed with patient? Yes  SHORT TERM GOALS: Target date: 05/15/23  Pt will demonstrate indep in HEP to facilitate carry-over of skilled services and improve functional outcomes Goal status: INITIAL  2.  Pt will decrease 5TSTS by at least 3 seconds in order to demonstrate clinically significant improvement in LE strength   Baseline: 34.43 sec Goal status: INITIAL  3.  Pt will increase by at least 40 ft in order to demonstrate clinically significant improvement in community ambulation Baseline: 140 ft Goal status: INITIAL  4.  Pt will increase LEFS by at least 9 points in order to demonstrate significant improvement in lower extremity function.  Baseline: 6 Goal status: INITIAL  LONG TERM GOALS: Target date: 07/10/23  Pt will decrease 5TSTS by at least 6 seconds in order to demonstrate clinically significant improvement in LE strength   Baseline: 34.43 sec Goal status: INITIAL  2.  Pt will increase by at least 80 ft in order to demonstrate clinically significant improvement in community ambulation Baseline: 140 ft Goal status: INITIAL  3.  Pt will increase LEFS by at least 18 points in order to demonstrate significant improvement in lower extremity function.  Baseline: 6 Goal status: INITIAL  4.  Pt will demonstrate increase in LE strength to 4 to facilitate ease and safety in  ambulation  Baseline: 3+ Goal status: INITIAL  5.  Pt will improve DGI by at least 3 points in order to demonstrate clinically significant improvement in balance and decreased risk for falls  Baseline: 8 Goal status: INITIAL  ASSESSMENT:  CLINICAL IMPRESSION: Interventions today were geared towards balance, LE strengthening and flexibility. Tolerated all activities without worsening of symptoms. Demonstrated mild to moderate levels of fatigue. Pacing of activities was slow. Rest periods given. Slight to mild unsteadiness seen with tandem stance  due to impaired proprioception. Provided slight amount of cueing to ensure correct execution of activity with good carry-over. To date, skilled PT is required to address the impairments and improve function.  EVAL: Patient is a 47 y.o. male who was seen today for physical therapy evaluation and treatment for Chronic Inflammatory Demyelinating Polyneuritis (CIDP). Patient was diagnosed with CIDP by referring provider further defined by difficulty with ambulation and negotiating stairs due to pain, weakness, impaired balance/proprioception and decreased soft tissue extensibility. Skilled PT is required to address the impairments and functional limitations listed below. Due to weakness in grip, patient may also need OT to address patient's issues with ADLs.Interventions today were geared towards gait training for safety. Tolerated all activities with improved confidence with safety when ambulating. May use a quad cane as needed. Demonstrated mild levels of fatigue. Rest periods provided. Provided slight amount of cueing to ensure correct execution of activity with good carry-over.      OBJECTIVE IMPAIRMENTS: Abnormal gait, decreased activity tolerance, decreased balance, decreased endurance, decreased mobility, difficulty walking, decreased strength, impaired flexibility, impaired sensation, and pain.   ACTIVITY LIMITATIONS: carrying, lifting, bending,  sitting, standing, squatting, stairs, transfers, bed mobility, continence, bathing, toileting, dressing, self feeding, reach over head, hygiene/grooming, locomotion level, and caring for others  PARTICIPATION LIMITATIONS: meal prep, cleaning, laundry, driving, shopping, community activity, occupation, and yard work  PERSONAL FACTORS: Time since onset of injury/illness/exacerbation and nature of the condition  are also affecting patient's functional outcome.   REHAB POTENTIAL: Fair    CLINICAL DECISION MAKING: Evolving/moderate complexity  EVALUATION COMPLEXITY: Moderate  PLAN:  PT FREQUENCY: 2x/week  PT DURATION: 12 weeks  PLANNED INTERVENTIONS: Therapeutic exercises, Therapeutic activity, Neuromuscular re-education, Balance training, Gait training, Patient/Family education, Self Care, Stair training, Orthotic/Fit training, Electrical stimulation, Manual therapy, and Re-evaluation  PLAN FOR NEXT SESSION: Continue POC and may progress as tolerated with emphasis on LE flexibility, functional strengthening, balance and gait activities.   Tish Frederickson. Duvid Smalls, PT, DPT, OCS Board-Certified Clinical Specialist in Orthopedic PT PT Compact Privilege # ([place]): [phone] T 04/25/2023, 8:57 AM

## 2023-04-28 ENCOUNTER — Encounter (HOSPITAL_COMMUNITY): Payer: 59

## 2023-04-28 ENCOUNTER — Telehealth (HOSPITAL_COMMUNITY): Payer: Self-pay

## 2023-04-28 NOTE — Progress Notes (Signed)
[facility]  EEG (ELECTROENCEPHALOGRAM) REPORT   STUDY DATE: 04/23/2023 PATIENT NAME: Connor Copeland DOB: Dec 23, 1975 MRN: [id number]  ORDERING CLINICIAN: Fama Muenchow A. Epimenio Foot, MD. PhD  TECHNOLOGIST: Marianne Sofia, REEGT  TECHNIQUE: Electroencephalogram was recorded utilizing standard 10-20 system of lead placement and reformatted into average and bipolar montages.  RECORDING TIME: 27 minutes 4 seconds  CLINICAL INFORMATION: 47 year old man with suspected alcohol withdrawal seizure  FINDINGS: A digital EEG was performed while the patient was awake and drowsy.  Excessive high-frequency artifact was noted throughout the recording and the high-frequency filter was set to 50 Hz.  While awake and most alert there was a 9 hz posterior dominant rhythm. Voltages and frequencies were symmetric.  There were no focal, lateralizing, epileptiform activity or seizures seen.  Photic stimulation had a normal driving response. Hyperventilation and recovery did not change the underlying rhythms. EKG channel shows normal sinus rhythm.  The patient became drowsy but did not enter definitive sleep.Marland Kitchen  IMPRESSION: This is a normal EEG.   INTERPRETING PHYSICIAN:   Byrne Capek A. Epimenio Foot, MD, PhD, [facility] Certified in Neurology, Clinical Neurophysiology, Sleep Medicine, Pain Medicine and Neuroimaging  [facility] [address] [place], [place] [address] [phone]

## 2023-04-28 NOTE — Telephone Encounter (Signed)
Called patient today to follow up with his appointment for his 1st no show. Patient did not answer but PT was unable to leave a voice message because the voice mailbox is full.  Connor Copeland. Connor Copeland, PT, DPT, OCS Board-Certified Clinical Specialist in Orthopedic PT PT Compact Privilege # (): [phone] T

## 2023-04-30 ENCOUNTER — Other Ambulatory Visit: Payer: Self-pay

## 2023-04-30 ENCOUNTER — Telehealth: Payer: Self-pay

## 2023-04-30 NOTE — Telephone Encounter (Signed)
Called pt and let him know that per Dr. Epimenio Foot his EEG Results " EEG was normal"  Pt states that he is in a lot of pain and is sweating 64 degrees in his house. Pt states that he is taking the Prednisone and has gained weight. Pt is asking for 10mg  Hydrocodone for his pain.

## 2023-04-30 NOTE — Telephone Encounter (Signed)
Mail box was full,unable to LVM

## 2023-05-01 NOTE — Telephone Encounter (Signed)
Spoke w/ Dr. Epimenio Foot. He will review message and let us know options

## 2023-05-01 NOTE — Telephone Encounter (Signed)
Pt called to check on the status of sending 10mg  Hydrocodone to the pharmacy.

## 2023-05-01 NOTE — Telephone Encounter (Signed)
Tried calling pt back, mailbox full, unable to LVM

## 2023-05-02 ENCOUNTER — Ambulatory Visit (HOSPITAL_COMMUNITY): Payer: 59

## 2023-05-02 DIAGNOSIS — R262 Difficulty in walking, not elsewhere classified: Secondary | ICD-10-CM | POA: Diagnosis not present

## 2023-05-02 DIAGNOSIS — R29898 Other symptoms and signs involving the musculoskeletal system: Secondary | ICD-10-CM

## 2023-05-02 DIAGNOSIS — M79604 Pain in right leg: Secondary | ICD-10-CM

## 2023-05-02 NOTE — Therapy (Signed)
OUTPATIENT PHYSICAL THERAPY NEURO TREATMENT   Patient Name: Connor Copeland MRN: [id number] DOB:Jul 12, 1976, 47 y.o., male Today's Date: 05/02/2023  PCP: none on file REFERRING PROVIDER: Benita Stabile, MD  END OF SESSION:  PT End of Session - 05/02/23 0804     Visit Number 4    Number of Visits 24    Date for PT Re-Evaluation 07/10/23    Authorization Type [employer] (no auth, no limit, no copay)    Progress Note Due on Visit 8    PT Start Time 0800    PT Stop Time 0845    PT Time Calculation (min) 45 min    Equipment Utilized During Treatment Gait belt    Activity Tolerance Patient limited by pain;Patient limited by fatigue    Behavior During Therapy 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   Neuropathic pain 03/19/2023   Delirium tremens (HCC) 01/29/2023   Alcohol withdrawal seizure (HCC) 01/28/2023   Alcohol abuse 01/28/2023   Tobacco use disorder 01/28/2023   Hyponatremia 01/28/2023   Elevated MCV 01/28/2023   Macrocytic anemia 01/28/2023   Elevated transaminase level 01/28/2023   Hyperbilirubinemia 01/28/2023   Gout 01/28/2023   HLD (hyperlipidemia) 12/11/2022   Hypertriglyceridemia 12/11/2022   Family history of coronary artery disease 12/11/2022   Peripheral neuropathy 12/11/2022   Leg swelling 12/11/2022   Hypoalbuminemia 12/11/2022    ONSET DATE: March 2024  REFERRING DIAG: G61.81 (ICD-10-CM) - Chronic inflammatory demyelinating polyneuritis  THERAPY DIAG:  Difficulty in walking, not elsewhere classified  Weakness of both lower extremities  Pain in both lower extremities  Rationale for Evaluation and Treatment:  Rehabilitation  SUBJECTIVE:                                                                                                                                                                                             SUBJECTIVE STATEMENT: Denies any recent falls. Patient reports that he was unable to attend to his last session because he did not sleep good the other day. Currently reports of B LE pain = 9/10. Reports that he was able to walk and achieve 8000 steps last Saturday.  EVAL: Arrives to the clinic with c/o difficulty with walking, weakness on  B LE (R>L) and some unsteadiness especially when standing with his eyes closed. Uses a rolling walker at times. Patient also reports that he gets tired easily and feels numb on B UE/LE. Patient also states that his grip is weak. Condition started March 2024 when patient had pains on the hip without apparent reason  which gradually got worse until the pain extended to the feet and hands. Condition got worse in May 2024 where pain was so severe that patient cannot work anymore. Patient also had a seizure in June 2024 and was admitted to the hospital where patient had PT. Patient was D/C and had PT at home for 2 months. In July 2024, MD told him that he may have CIDP. Patient was given Prednisone 80 mg just recently and will be tapered down weekly. Patient was then referred to outpatient PT evaluation and management. Pt accompanied by: self  PERTINENT HISTORY: seizure disorder, gout  PAIN:  Are you having pain? Yes: NPRS scale: 9/10 Pain location: hands/legs Pain description: sharp, burning, throbbing Aggravating factors: laying in bed at night Relieving factors: change position and movement  PRECAUTIONS: Fall  RED FLAGS: Bowel or bladder incontinence: No and Spinal tumors: No   WEIGHT BEARING RESTRICTIONS: No  FALLS: Has patient fallen in last 6 months? Yes. Number of falls 4 (last was in June 2024)  LIVING ENVIRONMENT: Lives with:  lives  with mother Lives in: House/apartment Stairs: Yes: Internal: 14 steps; on right going up and External: 4 steps; bilateral but cannot reach both Has following equipment at home: Dan Humphreys - 2 wheeled  PLOF: Independent, Independent with basic ADLs, and used to be a shift leader at [employer]  PATIENT GOALS: "try to get my balance so I go back to work"  OBJECTIVE:   DIAGNOSTIC FINDINGS:  NEUROIMAGING REPORT   STUDY DATE: 02/05/2023 ... Impression This MRI of the cervical spine without contrast shows the  following:  The spinal cord appears normal.   There is no critical spinal stenosis.  At C6-C7 there are degenerative changes combining to cause mild spinal  stenosis and left greater than right foraminal narrowing.  Although there  is no definite nerve root compression the degenerative changes encroach  upon the C7 nerve roots.  Milder degenerative changes at the other cervical levels as detailed above  do not lead to spinal stenosis or nerve root compression. ...  NEUROIMAGING REPORT   STUDY DATE: 02/05/2023 ... Impression This MRI of the lumbar spine without contrast shows the  following:  The terminal spinal cord and the conus medullary medullaris appear normal.  Degenerative changes at L3-L4, L4-L5 and L5-S1 are noted but do not lead  to spinal stenosis or nerve root compression.  COGNITION: Overall cognitive status: Within functional limits for tasks assessed   SENSATION: Light touch: Impaired  on B LE  MUSCLE LENGTH: Hamstrings: mild restriction on B Gastrocnemius: mild restriction on B  POSTURE: rounded shoulders, forward head, decreased lumbar lordosis, and knees slightly flexed (standing)  LOWER EXTREMITY ROM:     Active  Right Eval Left Eval  Hip flexion [facility]  Hip extension    Hip abduction [facility]  Hip adduction [facility]  Hip internal rotation    Hip external rotation    Knee flexion [facility]  Knee extension [facility]  Ankle dorsiflexion [facility]   Ankle plantarflexion [facility]  Ankle inversion    Ankle eversion     (Blank rows = not tested)  LOWER  EXTREMITY MMT:    MMT Right Eval Left Eval  Hip flexion 3+ 3+  Hip extension    Hip abduction 4- 3+  Hip adduction 4- 4-  Hip internal rotation    Hip external rotation    Knee flexion 3+ 3+  Knee extension 3+ 3+  Ankle dorsiflexion 3+ 3+  Ankle plantarflexion 3+ 3+  Ankle inversion    Ankle eversion    (Blank rows = not tested)  TRANSFERS: Assistive device utilized: None  Sit to stand: SBA Stand to sit: SBA  STAIRS: Level of Assistance: SBA Stair Negotiation Technique: Step to Pattern with Single Rail on Right Number of Stairs: 4  Height of Stairs: 7"  Comments: Wide BOS, slightly staggering, knees slightly flexed, slightly unsteady  GAIT: Gait pattern: decreased arm swing- Right, decreased arm swing- Left, decreased step length- Right, decreased step length- Left, decreased stride length, decreased ankle dorsiflexion- Right, decreased ankle dorsiflexion- Left, knee flexed in stance- Right, knee flexed in stance- Left, decreased trunk rotation, trunk flexed, wide BOS, poor foot clearance- Right, and poor foot clearance- Left Distance walked: 140 ft Assistive device utilized: None Level of assistance: CGA Comments: mildly unsteady  FUNCTIONAL TESTS:  5 times sit to stand: 34.43 sec 2 minute walk test: 140 ft Dynamic Gait Index: 8  DGI 1. Gait level surface (1) Moderate Impairment: Walks 20', slow speed, abnormal gait pattern, evidence for imbalance. 2. Change in gait speed (1) Moderate Impairment: Makes only minor adjustments to walking speed, or accomplishes a change in speed with significant gait deviations, or changes speed but has significant gait deviations, or changes speed but loses balance but is able to recover and continue walking. 3. Gait with horizontal head turns (1) Moderate Impairment: Performs head turns with moderate change in gait velocity,  slows down, staggers but recovers, can continue to walk. 4. Gait with vertical head turns 1) Moderate Impairment: Performs head turns with moderate change in gait velocity, slows down, staggers but recovers, can continue to walk. 5. Gait and pivot turn (1) Moderate Impairment: Turns slowly, requires verbal cueing, requires several small steps to catch balance following turn and stop. 6. Step over obstacle (1) Moderate Impairment: Is able to step over box but must stop, then step over. May require verbal cueing. 7. Step around obstacles (1) Moderate Impairment: Is able to clear cones but must significantly slow, speed to accomplish task, or requires verbal cueing. 8. Stairs (1) Moderate Impairment: Two feet to a stair, must use rail.  TOTAL SCORE: 8 / 24  PATIENT SURVEYS:  LEFS 6/80 = 7.5%  TODAY'S TREATMENT:                                                                                                                              DATE:  05/02/23 Recumbent stepper, arm 8, seat 13, level 3, > 60 SPM x 5'  Gastrocsoleus slant board stretch x 30" x 3 Standing:  Mini squats, yellow  med ball x 3" x 10 x 2  Heel raises x 10 x 2  Toe raises x 10 x 2  High marches x 10 x 2  Rhythmic stabilization, foam, normal BOS, eyes open x ant/post x 3" x 10 x 2  Chop/lift, yellow med ball, foam, normal, BOS x 10  Tandem stance, firm surface, eyes open x 30" x 2  TKE, [place]craft, 1 plate x 10 x 2 x 3" each  Resisted walking backwards with bodycraft, 2 plates x 3 rounds  04/25/23 Recumbent stepper, arm 8, seat 13, level 1, > 60 SPM x 5'  Gastrocsoleus slant board stretch x 30" x 3 Standing:  Rhythmic stabilization, firm surface, normal BOS, eyes open x ant/post x 3" x 10 x 2  Yellow med ball raises, firm surface, normal, BOS, eyes open, x 10  Tandem stance, firm surface, eyes open x 30" x 2  TKE, RTB x 10 x 2 x 3"  Resisted walking backwards with bodycraft, 2 plates x 5 rounds  04/24/23 Recumbent  stepper, arm 8, seat 13, level 1, > 60 SPM x 5' Seated Gastrocsoleus stretch with a strap x 30" x 3 Standing:  Mini squats x 3" x 10 x 2  Hip vectors, RTB x 10 x 2  Heel/toe raises x 10 x 2  TKE, RTB x 10 x 2 x 3"  04/17/23 Evaluation and patient education done Gait training with a quad cane with CGA for around 80 ft    PATIENT EDUCATION: Education details: Updated written HEP Person educated: Patient Education method: Explanation Education comprehension: verbalized understanding  HOME EXERCISE PROGRAM: Access Code: [email or web address] URL: [email or web address] 05/02/2023 - Standing March with Counter Support  - 1-2 x daily - 7 x weekly - 2 sets - 10 reps  04/25/2023 - Tandem Stance with Support  - 1-2 x daily - 7 x weekly - 2 reps - 30 hold  Date: 04/24/2023 Prepared by: Krystal Clark  Exercises - Seated Calf Stretch with Strap  - 1-2 x daily - 7 x weekly - 3 reps - 30 hold - Mini Squat with Counter Support  - 1-2 x daily - 7 x weekly - 2 sets - 10 reps - 3 hold - Standing 3-Way Leg Reach with Resistance at Ankles and Counter Support  - 1-2 x daily - 7 x weekly - 2 sets - 10 reps - Heel Toe Raises with Counter Support  - 1-2 x daily - 7 x weekly - 2 sets - 10 reps  GOALS: Goals reviewed with patient? Yes  SHORT TERM GOALS: Target date: 05/15/23  Pt will demonstrate indep in HEP to facilitate carry-over of skilled services and improve functional outcomes Goal status: INITIAL  2.  Pt will decrease 5TSTS by at least 3 seconds in order to demonstrate clinically significant improvement in LE strength   Baseline: 34.43 sec Goal status: INITIAL  3.  Pt will increase by at least 40 ft in order to demonstrate clinically significant improvement in community ambulation Baseline: 140 ft Goal status: INITIAL  4.  Pt will increase LEFS by at least 9 points in order to demonstrate significant improvement in lower extremity function.  Baseline: 6 Goal status:  INITIAL  LONG TERM GOALS: Target date: 07/10/23  Pt will decrease 5TSTS by at least 6 seconds in order to demonstrate clinically significant improvement in LE strength   Baseline: 34.43 sec Goal status: INITIAL  2.  Pt will increase by at least 80 ft in order  to demonstrate clinically significant improvement in community ambulation Baseline: 140 ft Goal status: INITIAL  3.  Pt will increase LEFS by at least 18 points in order to demonstrate significant improvement in lower extremity function.  Baseline: 6 Goal status: INITIAL  4.  Pt will demonstrate increase in LE strength to 4 to facilitate ease and safety in ambulation  Baseline: 3+ Goal status: INITIAL  5.  Pt will improve DGI by at least 3 points in order to demonstrate clinically significant improvement in balance and decreased risk for falls  Baseline: 8 Goal status: INITIAL  ASSESSMENT:  CLINICAL IMPRESSION: Interventions today were geared towards balance, LE strengthening and flexibility. Tolerated all activities without worsening of symptoms except when patient reported of burning sensation on the calf on the last repetition of calf stretch. Demonstrated moderate levels of fatigue. Pacing of activities was slow. Rest periods given. Slight to mild unsteadiness seen with tandem stance and standing on foam due to impaired proprioception. Provided slight amount of cueing to ensure correct execution of activity with good carry-over. To date, skilled PT is required to address the impairments and improve function.  EVAL: Patient is a 47 y.o. male who was seen today for physical therapy evaluation and treatment for Chronic Inflammatory Demyelinating Polyneuritis (CIDP). Patient was diagnosed with CIDP by referring provider further defined by difficulty with ambulation and negotiating stairs due to pain, weakness, impaired balance/proprioception and decreased soft tissue extensibility. Skilled PT is required to address the  impairments and functional limitations listed below. Due to weakness in grip, patient may also need OT to address patient's issues with ADLs.Interventions today were geared towards gait training for safety. Tolerated all activities with improved confidence with safety when ambulating. May use a quad cane as needed. Demonstrated mild levels of fatigue. Rest periods provided. Provided slight amount of cueing to ensure correct execution of activity with good carry-over.      OBJECTIVE IMPAIRMENTS: Abnormal gait, decreased activity tolerance, decreased balance, decreased endurance, decreased mobility, difficulty walking, decreased strength, impaired flexibility, impaired sensation, and pain.   ACTIVITY LIMITATIONS: carrying, lifting, bending, sitting, standing, squatting, stairs, transfers, bed mobility, continence, bathing, toileting, dressing, self feeding, reach over head, hygiene/grooming, locomotion level, and caring for others  PARTICIPATION LIMITATIONS: meal prep, cleaning, laundry, driving, shopping, community activity, occupation, and yard work  PERSONAL FACTORS: Time since onset of injury/illness/exacerbation and nature of the condition  are also affecting patient's functional outcome.   REHAB POTENTIAL: Fair    CLINICAL DECISION MAKING: Evolving/moderate complexity  EVALUATION COMPLEXITY: Moderate  PLAN:  PT FREQUENCY: 2x/week  PT DURATION: 12 weeks  PLANNED INTERVENTIONS: Therapeutic exercises, Therapeutic activity, Neuromuscular re-education, Balance training, Gait training, Patient/Family education, Self Care, Stair training, Orthotic/Fit training, Electrical stimulation, Manual therapy, and Re-evaluation  PLAN FOR NEXT SESSION: Continue POC and may progress as tolerated with emphasis on LE flexibility, functional strengthening, balance and gait activities.   Tish Frederickson. Beaulah Romanek, PT, DPT, OCS Board-Certified Clinical Specialist in Orthopedic PT PT Compact Privilege # ([place]):  [id number] T 05/02/2023, 8:53 AM

## 2023-05-03 LAB — LAB REPORT - SCANNED
A1c: 5.5
Albumin, Urine POC: 13.6
Creatinine, POC: 121 mg/dL
EGFR: 95
Microalb Creat Ratio: 11

## 2023-05-06 MED ORDER — TRAMADOL HCL 50 MG PO TABS
50.0000 mg | ORAL_TABLET | Freq: Four times a day (QID) | ORAL | 3 refills | Status: DC
Start: 2023-05-06 — End: 2023-08-26

## 2023-05-06 NOTE — Addendum Note (Signed)
Addended by: Arther Abbott on: 05/06/2023 09:07 AM   Modules accepted: Orders

## 2023-05-06 NOTE — Telephone Encounter (Signed)
Called pt. Relayed Dr. Bonnita Hollow recommendation. He is agreeable to plan. Aware I will send back to MD to e-scribe rx to:  [facility] - [place], [place] - [address]

## 2023-05-07 LAB — FUNGUS CULTURE W SMEAR
CULTURE:: NO GROWTH
MICRO NUMBER:: 15412876
SMEAR:: NONE SEEN
SPECIMEN QUALITY:: ADEQUATE

## 2023-05-07 LAB — CSF CELL COUNT WITH DIFFERENTIAL
RBC Count, CSF: 0 {cells}/uL
TOTAL NUCLEATED CELL: 4 {cells}/uL (ref 0–5)

## 2023-05-07 LAB — GRAM STAIN
MICRO NUMBER:: 15412875
SPECIMEN QUALITY:: ADEQUATE

## 2023-05-07 LAB — VDRL, CSF: VDRL Quant, CSF: NONREACTIVE

## 2023-05-07 LAB — PROTEIN, CSF: Total Protein, CSF: 38 mg/dL (ref 15–45)

## 2023-05-07 LAB — GLUCOSE, CSF: Glucose, CSF: 64 mg/dL (ref 40–80)

## 2023-05-09 ENCOUNTER — Ambulatory Visit (HOSPITAL_COMMUNITY): Payer: 59 | Attending: Dermatology

## 2023-05-09 DIAGNOSIS — M79605 Pain in left leg: Secondary | ICD-10-CM | POA: Diagnosis present

## 2023-05-09 DIAGNOSIS — M79604 Pain in right leg: Secondary | ICD-10-CM | POA: Insufficient documentation

## 2023-05-09 DIAGNOSIS — R262 Difficulty in walking, not elsewhere classified: Secondary | ICD-10-CM | POA: Diagnosis present

## 2023-05-09 DIAGNOSIS — R29898 Other symptoms and signs involving the musculoskeletal system: Secondary | ICD-10-CM | POA: Insufficient documentation

## 2023-05-09 NOTE — Therapy (Addendum)
OUTPATIENT PHYSICAL THERAPY NEURO TREATMENT   Patient Name: Connor Copeland MRN: [id number] DOB:06-Apr-1976, 47 y.o., male Today's Date: 05/09/2023  PCP: none on file REFERRING PROVIDER: Benita Stabile, MD  END OF SESSION:  PT End of Session - 05/09/23 0806     Visit Number 5    Number of Visits 24    Date for PT Re-Evaluation 07/10/23    Authorization Type [employer] (no auth, no limit, no copay)    Progress Note Due on Visit 8    PT Start Time 0800    PT Stop Time 0840    PT Time Calculation (min) 40 min    Equipment Utilized During Treatment Gait belt    Activity Tolerance Patient limited by pain;Patient limited by fatigue    Behavior During Therapy [facility] for tasks assessed/performed            Past Medical History:  Diagnosis Date   ETOH abuse    Gout    Past Surgical History:  Procedure Laterality Date   FOOT SURGERY     Patient Active Problem List   Diagnosis Date Noted   Seizure disorder (HCC) 03/19/2023   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   Neuropathic pain 03/19/2023   Delirium tremens (HCC) 01/29/2023   Alcohol withdrawal seizure (HCC) 01/28/2023   Alcohol abuse 01/28/2023   Tobacco use disorder 01/28/2023   Hyponatremia 01/28/2023   Elevated MCV 01/28/2023   Macrocytic anemia 01/28/2023   Elevated transaminase level 01/28/2023   Hyperbilirubinemia 01/28/2023   Gout 01/28/2023   HLD (hyperlipidemia) 12/11/2022   Hypertriglyceridemia 12/11/2022   Family history of coronary artery disease 12/11/2022   Peripheral neuropathy 12/11/2022   Leg swelling 12/11/2022   Hypoalbuminemia 12/11/2022    ONSET DATE: March 2024  REFERRING DIAG: G61.81 (ICD-10-CM) - Chronic inflammatory demyelinating polyneuritis  THERAPY DIAG:  Difficulty in walking, not elsewhere classified  Weakness of both lower extremities  Pain in both lower extremities  Rationale for Evaluation and Treatment:  Rehabilitation  SUBJECTIVE:                                                                                                                                                                                             SUBJECTIVE STATEMENT: Doing well today. LE pain remains at 9/10. Denies any falls.   EVAL: Arrives to the clinic with c/o difficulty with walking, weakness on B LE (R>L) and some unsteadiness especially when standing with his eyes closed. Uses a rolling walker at times. Patient also reports that he gets tired easily and feels numb on B UE/LE. Patient also states that his  grip is weak. Condition started March 2024 when patient had pains on the hip without apparent reason  which gradually got worse until the pain extended to the feet and hands. Condition got worse in May 2024 where pain was so severe that patient cannot work anymore. Patient also had a seizure in June 2024 and was admitted to the hospital where patient had PT. Patient was D/C and had PT at home for 2 months. In July 2024, MD told him that he may have CIDP. Patient was given Prednisone 80 mg just recently and will be tapered down weekly. Patient was then referred to outpatient PT evaluation and management. Pt accompanied by: self  PERTINENT HISTORY: seizure disorder, gout  PAIN:  Are you having pain? Yes: NPRS scale: 9/10 Pain location: hands/legs Pain description: sharp, burning, throbbing Aggravating factors: laying in bed at night Relieving factors: change position and movement  PRECAUTIONS: Fall  RED FLAGS: Bowel or bladder incontinence: No and Spinal tumors: No   WEIGHT BEARING RESTRICTIONS: No  FALLS: Has patient fallen in last 6 months? Yes. Number of falls 4 (last was in June 2024)  LIVING ENVIRONMENT: Lives with:  lives with mother Lives in: House/apartment Stairs: Yes: Internal: 14 steps; on right going up and External: 4 steps; bilateral but cannot reach both Has following equipment at home: Dan Humphreys  - 2 wheeled  PLOF: Independent, Independent with basic ADLs, and used to be a shift leader at [employer]  PATIENT GOALS: "try to get my balance so I go back to work"  OBJECTIVE:   DIAGNOSTIC FINDINGS:  NEUROIMAGING REPORT   STUDY DATE: 02/05/2023 ... Impression This MRI of the cervical spine without contrast shows the  following:  The spinal cord appears normal.   There is no critical spinal stenosis.  At C6-C7 there are degenerative changes combining to cause mild spinal  stenosis and left greater than right foraminal narrowing.  Although there  is no definite nerve root compression the degenerative changes encroach  upon the C7 nerve roots.  Milder degenerative changes at the other cervical levels as detailed above  do not lead to spinal stenosis or nerve root compression. ...  NEUROIMAGING REPORT   STUDY DATE: 02/05/2023 ... Impression This MRI of the lumbar spine without contrast shows the  following:  The terminal spinal cord and the conus medullary medullaris appear normal.  Degenerative changes at L3-L4, L4-L5 and L5-S1 are noted but do not lead  to spinal stenosis or nerve root compression.  COGNITION: Overall cognitive status: Within functional limits for tasks assessed   SENSATION: Light touch: Impaired  on B LE  MUSCLE LENGTH: Hamstrings: mild restriction on B Gastrocnemius: mild restriction on B  POSTURE: rounded shoulders, forward head, decreased lumbar lordosis, and knees slightly flexed (standing)  LOWER EXTREMITY ROM:     Active  Right Eval Left Eval  Hip flexion [facility]  Hip extension    Hip abduction [facility]  Hip adduction [facility]  Hip internal rotation    Hip external rotation    Knee flexion [facility]  Knee extension [facility]  Ankle dorsiflexion [facility]  Ankle plantarflexion [facility]  Ankle inversion    Ankle eversion     (Blank rows = not tested)  LOWER EXTREMITY MMT:    MMT Right Eval Left Eval  Hip flexion 3+ 3+  Hip extension     Hip abduction 4- 3+  Hip adduction 4- 4-  Hip internal rotation    Hip  external rotation    Knee flexion 3+ 3+  Knee extension 3+ 3+  Ankle dorsiflexion 3+ 3+  Ankle plantarflexion 3+ 3+  Ankle inversion    Ankle eversion    (Blank rows = not tested)  TRANSFERS: Assistive device utilized: None  Sit to stand: SBA Stand to sit: SBA  STAIRS: Level of Assistance: SBA Stair Negotiation Technique: Step to Pattern with Single Rail on Right Number of Stairs: 4  Height of Stairs: 7"  Comments: Wide BOS, slightly staggering, knees slightly flexed, slightly unsteady  GAIT: Gait pattern: decreased arm swing- Right, decreased arm swing- Left, decreased step length- Right, decreased step length- Left, decreased stride length, decreased ankle dorsiflexion- Right, decreased ankle dorsiflexion- Left, knee flexed in stance- Right, knee flexed in stance- Left, decreased trunk rotation, trunk flexed, wide BOS, poor foot clearance- Right, and poor foot clearance- Left Distance walked: 140 ft Assistive device utilized: None Level of assistance: CGA Comments: mildly unsteady  FUNCTIONAL TESTS:  5 times sit to stand: 34.43 sec 2 minute walk test: 140 ft Dynamic Gait Index: 8  DGI 1. Gait level surface (1) Moderate Impairment: Walks 20', slow speed, abnormal gait pattern, evidence for imbalance. 2. Change in gait speed (1) Moderate Impairment: Makes only minor adjustments to walking speed, or accomplishes a change in speed with significant gait deviations, or changes speed but has significant gait deviations, or changes speed but loses balance but is able to recover and continue walking. 3. Gait with horizontal head turns (1) Moderate Impairment: Performs head turns with moderate change in gait velocity, slows down, staggers but recovers, can continue to walk. 4. Gait with vertical head turns 1) Moderate Impairment: Performs head turns with moderate change in gait velocity, slows down, staggers  but recovers, can continue to walk. 5. Gait and pivot turn (1) Moderate Impairment: Turns slowly, requires verbal cueing, requires several small steps to catch balance following turn and stop. 6. Step over obstacle (1) Moderate Impairment: Is able to step over box but must stop, then step over. May require verbal cueing. 7. Step around obstacles (1) Moderate Impairment: Is able to clear cones but must significantly slow, speed to accomplish task, or requires verbal cueing. 8. Stairs (1) Moderate Impairment: Two feet to a stair, must use rail.  TOTAL SCORE: 8 / 24  PATIENT SURVEYS:  LEFS 6/80 = 7.5%  TODAY'S TREATMENT:                                                                                                                              DATE:  05/09/23 Education on sensory re-education techniques for 5 minutes beginning with softer textures like a satin cloth then progressed to rougher texture like a towel (gave excellent verbal understanding) Recumbent stepper, arm 8, seat 13, level 2, > 70 SPM x 5'  Gastrocsoleus slant board stretch x 30" x 3 Standing:  Heel raises x 10 x 2 x 2 lbs  Toe raises x  10 x 2 x 2 lbs  High marches x 10 x 2 x 2 lbs  Rocking back and forth with arm raise, one LE forward x 10 x 2 on each  Chop/lift, yellow med ball, foam, normal, BOS x 10  Tandem stance, firm surface, with push/pull with a dowel x 3" x 10 x 2 on each  Resisted walking backwards/forwards with bodycraft, 2 plates x 2 rounds  05/02/23 Recumbent stepper, arm 8, seat 13, level 3, > 60 SPM x 5'  Gastrocsoleus slant board stretch x 30" x 3 Standing:  Mini squats, yellow med ball x 3" x 10 x 2  Heel raises x 10 x 2  Toe raises x 10 x 2  High marches x 10 x 2  Rhythmic stabilization, foam, normal BOS, eyes open x ant/post x 3" x 10 x 2  Chop/lift, yellow med ball, foam, normal, BOS x 10  Tandem stance, firm surface, eyes open x 30" x 2  TKE, Bodycraft, 1 plate x 10 x 2 x 3" each  Resisted  walking backwards with bodycraft, 2 plates x 3 rounds  04/25/23 Recumbent stepper, arm 8, seat 13, level 1, > 60 SPM x 5'  Gastrocsoleus slant board stretch x 30" x 3 Standing:  Rhythmic stabilization, firm surface, normal BOS, eyes open x ant/post x 3" x 10 x 2  Yellow med ball raises, firm surface, normal, BOS, eyes open, x 10  Tandem stance, firm surface, eyes open x 30" x 2  TKE, RTB x 10 x 2 x 3"  Resisted walking backwards with bodycraft, 2 plates x 5 rounds  04/24/23 Recumbent stepper, arm 8, seat 13, level 1, > 60 SPM x 5' Seated Gastrocsoleus stretch with a strap x 30" x 3 Standing:  Mini squats x 3" x 10 x 2  Hip vectors, RTB x 10 x 2  Heel/toe raises x 10 x 2  TKE, RTB x 10 x 2 x 3"  04/17/23 Evaluation and patient education done Gait training with a quad cane with CGA for around 80 ft    PATIENT EDUCATION: Education details: Updated written HEP Person educated: Patient Education method: Explanation Education comprehension: verbalized understanding  HOME EXERCISE PROGRAM: Access Code: [email or web address] URL: [email or web address] 05/02/2023 - Standing March with Counter Support  - 1-2 x daily - 7 x weekly - 2 sets - 10 reps  04/25/2023 - Tandem Stance with Support  - 1-2 x daily - 7 x weekly - 2 reps - 30 hold  Date: 04/24/2023 Prepared by: Krystal Clark  Exercises - Seated Calf Stretch with Strap  - 1-2 x daily - 7 x weekly - 3 reps - 30 hold - Mini Squat with Counter Support  - 1-2 x daily - 7 x weekly - 2 sets - 10 reps - 3 hold - Standing 3-Way Leg Reach with Resistance at Ankles and Counter Support  - 1-2 x daily - 7 x weekly - 2 sets - 10 reps - Heel Toe Raises with Counter Support  - 1-2 x daily - 7 x weekly - 2 sets - 10 reps  GOALS: Goals reviewed with patient? Yes  SHORT TERM GOALS: Target date: 05/15/23  Pt will demonstrate indep in HEP to facilitate carry-over of skilled services and improve functional outcomes Goal status:  INITIAL  2.  Pt will decrease 5TSTS by at least 3 seconds in order to demonstrate clinically significant improvement in LE strength   Baseline: 34.43 sec Goal status: INITIAL  3.  Pt  will increase by at least 40 ft in order to demonstrate clinically significant improvement in community ambulation Baseline: 140 ft Goal status: INITIAL  4.  Pt will increase LEFS by at least 9 points in order to demonstrate significant improvement in lower extremity function.  Baseline: 6 Goal status: INITIAL  LONG TERM GOALS: Target date: 07/10/23  Pt will decrease 5TSTS by at least 6 seconds in order to demonstrate clinically significant improvement in LE strength   Baseline: 34.43 sec Goal status: INITIAL  2.  Pt will increase by at least 80 ft in order to demonstrate clinically significant improvement in community ambulation Baseline: 140 ft Goal status: INITIAL  3.  Pt will increase LEFS by at least 18 points in order to demonstrate significant improvement in lower extremity function.  Baseline: 6 Goal status: INITIAL  4.  Pt will demonstrate increase in LE strength to 4 to facilitate ease and safety in ambulation  Baseline: 3+ Goal status: INITIAL  5.  Pt will improve DGI by at least 3 points in order to demonstrate clinically significant improvement in balance and decreased risk for falls  Baseline: 8 Goal status: INITIAL  ASSESSMENT:  CLINICAL IMPRESSION: Interventions today were geared towards balance, LE strengthening and flexibility. Tolerated all activities without worsening of symptoms. Demonstrated moderate levels of fatigue. Pacing of activities was slow. Rest periods given. Slight to mild unsteadiness seen with rocking back/forth as well as during the chop/lift due to impaired proprioception. Provided slight amount of cueing to ensure correct execution of activity with good carry-over. To date, skilled PT is required to address the impairments and improve  function.  EVAL: Patient is a 47 y.o. male who was seen today for physical therapy evaluation and treatment for Chronic Inflammatory Demyelinating Polyneuritis (CIDP). Patient was diagnosed with CIDP by referring provider further defined by difficulty with ambulation and negotiating stairs due to pain, weakness, impaired balance/proprioception and decreased soft tissue extensibility. Skilled PT is required to address the impairments and functional limitations listed below. Due to weakness in grip, patient may also need OT to address patient's issues with ADLs.Interventions today were geared towards gait training for safety. Tolerated all activities with improved confidence with safety when ambulating. May use a quad cane as needed. Demonstrated mild levels of fatigue. Rest periods provided. Provided slight amount of cueing to ensure correct execution of activity with good carry-over.      OBJECTIVE IMPAIRMENTS: Abnormal gait, decreased activity tolerance, decreased balance, decreased endurance, decreased mobility, difficulty walking, decreased strength, impaired flexibility, impaired sensation, and pain.   ACTIVITY LIMITATIONS: carrying, lifting, bending, sitting, standing, squatting, stairs, transfers, bed mobility, continence, bathing, toileting, dressing, self feeding, reach over head, hygiene/grooming, locomotion level, and caring for others  PARTICIPATION LIMITATIONS: meal prep, cleaning, laundry, driving, shopping, community activity, occupation, and yard work  PERSONAL FACTORS: Time since onset of injury/illness/exacerbation and nature of the condition  are also affecting patient's functional outcome.   REHAB POTENTIAL: Fair    CLINICAL DECISION MAKING: Evolving/moderate complexity  EVALUATION COMPLEXITY: Moderate  PLAN:  PT FREQUENCY: 2x/week  PT DURATION: 12 weeks  PLANNED INTERVENTIONS: Therapeutic exercises, Therapeutic activity, Neuromuscular re-education, Balance training, Gait  training, Patient/Family education, Self Care, Stair training, Orthotic/Fit training, Electrical stimulation, Manual therapy, and Re-evaluation  PLAN FOR NEXT SESSION: Continue POC and may progress as tolerated with emphasis on LE flexibility, functional strengthening, balance and gait activities.   Tish Frederickson. Zohaib Heeney, PT, DPT, OCS Board-Certified Clinical Specialist in Orthopedic PT PT Compact Privilege # ([place]):  [phone] T 05/09/2023, 8:07 AM

## 2023-05-14 ENCOUNTER — Ambulatory Visit (HOSPITAL_COMMUNITY): Payer: 59

## 2023-05-14 DIAGNOSIS — R262 Difficulty in walking, not elsewhere classified: Secondary | ICD-10-CM | POA: Diagnosis not present

## 2023-05-14 DIAGNOSIS — R29898 Other symptoms and signs involving the musculoskeletal system: Secondary | ICD-10-CM

## 2023-05-14 DIAGNOSIS — M79605 Pain in left leg: Secondary | ICD-10-CM

## 2023-05-14 NOTE — Therapy (Signed)
OUTPATIENT PHYSICAL THERAPY NEURO TREATMENT   Patient Name: Connor Copeland MRN: [id number] DOB:February 24, 1976, 47 y.o., male Today's Date: 05/14/2023  PCP: none on file REFERRING PROVIDER: Benita Stabile, MD  END OF SESSION:  PT End of Session - 05/14/23 0813     Visit Number 6    Number of Visits 24    Date for PT Re-Evaluation 07/10/23    Authorization Type [employer] (no auth, no limit, no copay)    Progress Note Due on Visit 8    PT Start Time 0805    PT Stop Time 0845    PT Time Calculation (min) 40 min    Equipment Utilized During Treatment Gait belt    Activity Tolerance Patient limited by pain;Patient limited by fatigue    Behavior During Therapy [facility] for tasks assessed/performed             Past Medical History:  Diagnosis Date   ETOH abuse    Gout    Past Surgical History:  Procedure Laterality Date   FOOT SURGERY     Patient Active Problem List   Diagnosis Date Noted   Seizure disorder (HCC) 03/19/2023   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   Neuropathic pain 03/19/2023   Delirium tremens (HCC) 01/29/2023   Alcohol withdrawal seizure (HCC) 01/28/2023   Alcohol abuse 01/28/2023   Tobacco use disorder 01/28/2023   Hyponatremia 01/28/2023   Elevated MCV 01/28/2023   Macrocytic anemia 01/28/2023   Elevated transaminase level 01/28/2023   Hyperbilirubinemia 01/28/2023   Gout 01/28/2023   HLD (hyperlipidemia) 12/11/2022   Hypertriglyceridemia 12/11/2022   Family history of coronary artery disease 12/11/2022   Peripheral neuropathy 12/11/2022   Leg swelling 12/11/2022   Hypoalbuminemia 12/11/2022    ONSET DATE: March 2024  REFERRING DIAG: G61.81 (ICD-10-CM) - Chronic inflammatory demyelinating polyneuritis  THERAPY DIAG:  Difficulty in walking, not elsewhere classified  Weakness of both lower extremities  Pain in both lower extremities  Rationale for Evaluation and Treatment:  Rehabilitation  SUBJECTIVE:                                                                                                                                                                                             SUBJECTIVE STATEMENT: Still reports of pain on B LE = 9/10. Denies falls. PT noticed some abrasion wound on the L knee. Patient was unsure how he got the wound.  EVAL: Arrives to the clinic with c/o difficulty with walking, weakness on B LE (R>L) and some unsteadiness especially when standing with his eyes closed. Uses a rolling walker at times. Patient also  reports that he gets tired easily and feels numb on B UE/LE. Patient also states that his grip is weak. Condition started March 2024 when patient had pains on the hip without apparent reason  which gradually got worse until the pain extended to the feet and hands. Condition got worse in May 2024 where pain was so severe that patient cannot work anymore. Patient also had a seizure in June 2024 and was admitted to the hospital where patient had PT. Patient was D/C and had PT at home for 2 months. In July 2024, MD told him that he may have CIDP. Patient was given Prednisone 80 mg just recently and will be tapered down weekly. Patient was then referred to outpatient PT evaluation and management. Pt accompanied by: self  PERTINENT HISTORY: seizure disorder, gout  PAIN:  Are you having pain? Yes: NPRS scale: 9/10 Pain location: hands/legs Pain description: sharp, burning, throbbing Aggravating factors: laying in bed at night Relieving factors: change position and movement  PRECAUTIONS: Fall  RED FLAGS: Bowel or bladder incontinence: No and Spinal tumors: No   WEIGHT BEARING RESTRICTIONS: No  FALLS: Has patient fallen in last 6 months? Yes. Number of falls 4 (last was in June 2024)  LIVING ENVIRONMENT: Lives with:  lives with mother Lives in: House/apartment Stairs: Yes: Internal: 14 steps; on right going up and External: 4  steps; bilateral but cannot reach both Has following equipment at home: Dan Humphreys - 2 wheeled  PLOF: Independent, Independent with basic ADLs, and used to be a shift leader at [employer]  PATIENT GOALS: "try to get my balance so I go back to work"  OBJECTIVE:   DIAGNOSTIC FINDINGS:  NEUROIMAGING REPORT   STUDY DATE: 02/05/2023 ... Impression This MRI of the cervical spine without contrast shows the  following:  The spinal cord appears normal.   There is no critical spinal stenosis.  At C6-C7 there are degenerative changes combining to cause mild spinal  stenosis and left greater than right foraminal narrowing.  Although there  is no definite nerve root compression the degenerative changes encroach  upon the C7 nerve roots.  Milder degenerative changes at the other cervical levels as detailed above  do not lead to spinal stenosis or nerve root compression. ...  NEUROIMAGING REPORT   STUDY DATE: 02/05/2023 ... Impression This MRI of the lumbar spine without contrast shows the  following:  The terminal spinal cord and the conus medullary medullaris appear normal.  Degenerative changes at L3-L4, L4-L5 and L5-S1 are noted but do not lead  to spinal stenosis or nerve root compression.  COGNITION: Overall cognitive status: Within functional limits for tasks assessed   SENSATION: Light touch: Impaired  on B LE  MUSCLE LENGTH: Hamstrings: mild restriction on B Gastrocnemius: mild restriction on B  POSTURE: rounded shoulders, forward head, decreased lumbar lordosis, and knees slightly flexed (standing)  LOWER EXTREMITY ROM:     Active  Right Eval Left Eval  Hip flexion [facility]  Hip extension    Hip abduction [facility]  Hip adduction [facility]  Hip internal rotation    Hip external rotation    Knee flexion [facility]  Knee extension [facility]  Ankle dorsiflexion [facility]  Ankle plantarflexion [facility]  Ankle inversion    Ankle eversion     (Blank rows = not tested)  LOWER  EXTREMITY MMT:    MMT Right Eval Left Eval  Hip flexion 3+ 3+  Hip extension  Hip abduction 4- 3+  Hip adduction 4- 4-  Hip internal rotation    Hip external rotation    Knee flexion 3+ 3+  Knee extension 3+ 3+  Ankle dorsiflexion 3+ 3+  Ankle plantarflexion 3+ 3+  Ankle inversion    Ankle eversion    (Blank rows = not tested)  TRANSFERS: Assistive device utilized: None  Sit to stand: SBA Stand to sit: SBA  STAIRS: Level of Assistance: SBA Stair Negotiation Technique: Step to Pattern with Single Rail on Right Number of Stairs: 4  Height of Stairs: 7"  Comments: Wide BOS, slightly staggering, knees slightly flexed, slightly unsteady  GAIT: Gait pattern: decreased arm swing- Right, decreased arm swing- Left, decreased step length- Right, decreased step length- Left, decreased stride length, decreased ankle dorsiflexion- Right, decreased ankle dorsiflexion- Left, knee flexed in stance- Right, knee flexed in stance- Left, decreased trunk rotation, trunk flexed, wide BOS, poor foot clearance- Right, and poor foot clearance- Left Distance walked: 140 ft Assistive device utilized: None Level of assistance: CGA Comments: mildly unsteady  FUNCTIONAL TESTS:  5 times sit to stand: 34.43 sec 2 minute walk test: 140 ft Dynamic Gait Index: 8  DGI 1. Gait level surface (1) Moderate Impairment: Walks 20', slow speed, abnormal gait pattern, evidence for imbalance. 2. Change in gait speed (1) Moderate Impairment: Makes only minor adjustments to walking speed, or accomplishes a change in speed with significant gait deviations, or changes speed but has significant gait deviations, or changes speed but loses balance but is able to recover and continue walking. 3. Gait with horizontal head turns (1) Moderate Impairment: Performs head turns with moderate change in gait velocity, slows down, staggers but recovers, can continue to walk. 4. Gait with vertical head turns 1) Moderate  Impairment: Performs head turns with moderate change in gait velocity, slows down, staggers but recovers, can continue to walk. 5. Gait and pivot turn (1) Moderate Impairment: Turns slowly, requires verbal cueing, requires several small steps to catch balance following turn and stop. 6. Step over obstacle (1) Moderate Impairment: Is able to step over box but must stop, then step over. May require verbal cueing. 7. Step around obstacles (1) Moderate Impairment: Is able to clear cones but must significantly slow, speed to accomplish task, or requires verbal cueing. 8. Stairs (1) Moderate Impairment: Two feet to a stair, must use rail.  TOTAL SCORE: 8 / 24  PATIENT SURVEYS:  LEFS 6/80 = 7.5%  TODAY'S TREATMENT:                                                                                                                              DATE:  05/14/23 Recumbent stepper, arm 8, seat 13, level 2, > 70 SPM x 5'  Gastrocsoleus slant board stretch x 30" x 3 Standing:  Rocking back and forth with arm raise, one LE forward x 10 x 2 on each  Chop/lift, yellow med ball, foam, normal, BOS  x 10  Tandem stance, firm surface, with push/pull with a dowel x 3" x 10 on each  Bodycraft standing TKE x 3" x 10 x 1 plate each  57/8/46 Education on sensory re-education techniques for 5 minutes beginning with softer textures like a satin cloth then progressed to rougher texture like a towel (gave excellent verbal understanding) Recumbent stepper, arm 8, seat 13, level 2, > 70 SPM x 5'  Gastrocsoleus slant board stretch x 30" x 3 Standing:  Heel raises x 10 x 2 x 2 lbs  Toe raises x 10 x 2 x 2 lbs  High marches x 10 x 2 x 2 lbs  Rocking back and forth with arm raise, one LE forward x 10 x 2 on each  Chop/lift, yellow med ball, foam, normal, BOS x 10  Tandem stance, firm surface, with push/pull with a dowel x 3" x 10 x 2 on each  Resisted walking backwards/forwards with bodycraft, 2 plates x 2  rounds  05/02/23 Recumbent stepper, arm 8, seat 13, level 3, > 60 SPM x 5'  Gastrocsoleus slant board stretch x 30" x 3 Standing:  Mini squats, yellow med ball x 3" x 10 x 2  Heel raises x 10 x 2  Toe raises x 10 x 2  High marches x 10 x 2  Rhythmic stabilization, foam, normal BOS, eyes open x ant/post x 3" x 10 x 2  Chop/lift, yellow med ball, foam, normal, BOS x 10  Tandem stance, firm surface, eyes open x 30" x 2  TKE, Bodycraft, 1 plate x 10 x 2 x 3" each  Resisted walking backwards with bodycraft, 2 plates x 3 rounds  04/25/23 Recumbent stepper, arm 8, seat 13, level 1, > 60 SPM x 5'  Gastrocsoleus slant board stretch x 30" x 3 Standing:  Rhythmic stabilization, firm surface, normal BOS, eyes open x ant/post x 3" x 10 x 2  Yellow med ball raises, firm surface, normal, BOS, eyes open, x 10  Tandem stance, firm surface, eyes open x 30" x 2  TKE, RTB x 10 x 2 x 3"  Resisted walking backwards with bodycraft, 2 plates x 5 rounds  04/24/23 Recumbent stepper, arm 8, seat 13, level 1, > 60 SPM x 5' Seated Gastrocsoleus stretch with a strap x 30" x 3 Standing:  Mini squats x 3" x 10 x 2  Hip vectors, RTB x 10 x 2  Heel/toe raises x 10 x 2  TKE, RTB x 10 x 2 x 3"  04/17/23 Evaluation and patient education done Gait training with a quad cane with CGA for around 80 ft    PATIENT EDUCATION: Education details: Updated written HEP Person educated: Patient Education method: Explanation Education comprehension: verbalized understanding  HOME EXERCISE PROGRAM: Access Code: [email or web address] URL: [email or web address] 05/02/2023 - Standing March with Counter Support  - 1-2 x daily - 7 x weekly - 2 sets - 10 reps  04/25/2023 - Tandem Stance with Support  - 1-2 x daily - 7 x weekly - 2 reps - 30 hold  Date: 04/24/2023 Prepared by: Krystal Clark  Exercises - Seated Calf Stretch with Strap  - 1-2 x daily - 7 x weekly - 3 reps - 30 hold - Mini Squat with Counter Support  -  1-2 x daily - 7 x weekly - 2 sets - 10 reps - 3 hold - Standing 3-Way Leg Reach with Resistance at Ankles and Counter Support  - 1-2 x daily - 7 x  weekly - 2 sets - 10 reps - Heel Toe Raises with Counter Support  - 1-2 x daily - 7 x weekly - 2 sets - 10 reps  GOALS: Goals reviewed with patient? Yes  SHORT TERM GOALS: Target date: 05/15/23  Pt will demonstrate indep in HEP to facilitate carry-over of skilled services and improve functional outcomes Goal status: INITIAL  2.  Pt will decrease 5TSTS by at least 3 seconds in order to demonstrate clinically significant improvement in LE strength   Baseline: 34.43 sec Goal status: INITIAL  3.  Pt will increase by at least 40 ft in order to demonstrate clinically significant improvement in community ambulation Baseline: 140 ft Goal status: INITIAL  4.  Pt will increase LEFS by at least 9 points in order to demonstrate significant improvement in lower extremity function.  Baseline: 6 Goal status: INITIAL  LONG TERM GOALS: Target date: 07/10/23  Pt will decrease 5TSTS by at least 6 seconds in order to demonstrate clinically significant improvement in LE strength   Baseline: 34.43 sec Goal status: INITIAL  2.  Pt will increase by at least 80 ft in order to demonstrate clinically significant improvement in community ambulation Baseline: 140 ft Goal status: INITIAL  3.  Pt will increase LEFS by at least 18 points in order to demonstrate significant improvement in lower extremity function.  Baseline: 6 Goal status: INITIAL  4.  Pt will demonstrate increase in LE strength to 4 to facilitate ease and safety in ambulation  Baseline: 3+ Goal status: INITIAL  5.  Pt will improve DGI by at least 3 points in order to demonstrate clinically significant improvement in balance and decreased risk for falls  Baseline: 8 Goal status: INITIAL  ASSESSMENT:  CLINICAL IMPRESSION: Interventions today were geared towards balance, LE  strengthening and flexibility. Tolerated all activities without worsening of symptoms. Demonstrated mod to severe levels of fatigue (RPE = 9.5/10) compared to the previous session. Pacing of activities was slower than the previous session. Frequent and prolonged rest periods given. Mild unsteadiness seen with rocking back/forth as well as during the chop/lift due to impaired proprioception. Provided slight amount of cueing to ensure correct execution of activity with good carry-over. To date, skilled PT is required to address the impairments and improve function.  EVAL: Patient is a 47 y.o. male who was seen today for physical therapy evaluation and treatment for Chronic Inflammatory Demyelinating Polyneuritis (CIDP). Patient was diagnosed with CIDP by referring provider further defined by difficulty with ambulation and negotiating stairs due to pain, weakness, impaired balance/proprioception and decreased soft tissue extensibility. Skilled PT is required to address the impairments and functional limitations listed below. Due to weakness in grip, patient may also need OT to address patient's issues with ADLs.Interventions today were geared towards gait training for safety. Tolerated all activities with improved confidence with safety when ambulating. May use a quad cane as needed. Demonstrated mild levels of fatigue. Rest periods provided. Provided slight amount of cueing to ensure correct execution of activity with good carry-over.      OBJECTIVE IMPAIRMENTS: Abnormal gait, decreased activity tolerance, decreased balance, decreased endurance, decreased mobility, difficulty walking, decreased strength, impaired flexibility, impaired sensation, and pain.   ACTIVITY LIMITATIONS: carrying, lifting, bending, sitting, standing, squatting, stairs, transfers, bed mobility, continence, bathing, toileting, dressing, self feeding, reach over head, hygiene/grooming, locomotion level, and caring for  others  PARTICIPATION LIMITATIONS: meal prep, cleaning, laundry, driving, shopping, community activity, occupation, and yard work  PERSONAL FACTORS: Time since  onset of injury/illness/exacerbation and nature of the condition  are also affecting patient's functional outcome.   REHAB POTENTIAL: Fair    CLINICAL DECISION MAKING: Evolving/moderate complexity  EVALUATION COMPLEXITY: Moderate  PLAN:  PT FREQUENCY: 2x/week  PT DURATION: 12 weeks  PLANNED INTERVENTIONS: Therapeutic exercises, Therapeutic activity, Neuromuscular re-education, Balance training, Gait training, Patient/Family education, Self Care, Stair training, Orthotic/Fit training, Electrical stimulation, Manual therapy, and Re-evaluation  PLAN FOR NEXT SESSION: Continue POC and may progress as tolerated with emphasis on LE flexibility, functional strengthening, balance and gait activities.   Tish Frederickson. Seng Fouts, PT, DPT, OCS Board-Certified Clinical Specialist in Orthopedic PT PT Compact Privilege # ([place]): [id number] T 05/14/2023, 8:14 AM

## 2023-05-15 ENCOUNTER — Encounter: Payer: Self-pay | Admitting: Internal Medicine

## 2023-05-16 ENCOUNTER — Ambulatory Visit (HOSPITAL_COMMUNITY): Payer: 59

## 2023-05-16 DIAGNOSIS — R262 Difficulty in walking, not elsewhere classified: Secondary | ICD-10-CM | POA: Diagnosis not present

## 2023-05-16 DIAGNOSIS — R29898 Other symptoms and signs involving the musculoskeletal system: Secondary | ICD-10-CM

## 2023-05-16 DIAGNOSIS — M79605 Pain in left leg: Secondary | ICD-10-CM

## 2023-05-16 NOTE — Therapy (Addendum)
OUTPATIENT PHYSICAL THERAPY NEURO TREATMENT   Patient Name: Connor Copeland MRN: [id number] DOB:03/15/1976, 47 y.o., male Today's Date: 05/16/2023  PCP: none on file REFERRING PROVIDER: Benita Stabile, MD  END OF SESSION:   05/16/23 0808  PT Visits / Re-Eval  Visit Number 7  Number of Visits 24  Date for PT Re-Evaluation 07/10/23  Authorization  Authorization Type [employer] (no auth, no limit, no copay)  Progress Note Due on Visit 8  PT Time Calculation  PT Start Time 0800  PT Stop Time 0840  PT Time Calculation (min) 40 min  PT - End of Session  Equipment Utilized During Treatment Gait belt  Activity Tolerance Patient limited by pain;Patient limited by fatigue  Behavior During Therapy [facility] for tasks assessed/performed     Past Medical History:  Diagnosis Date   ETOH abuse    Gout    Past Surgical History:  Procedure Laterality Date   FOOT SURGERY     Patient Active Problem List   Diagnosis Date Noted   Seizure disorder (HCC) 03/19/2023   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   Neuropathic pain 03/19/2023   Delirium tremens (HCC) 01/29/2023   Alcohol withdrawal seizure (HCC) 01/28/2023   Alcohol abuse 01/28/2023   Tobacco use disorder 01/28/2023   Hyponatremia 01/28/2023   Elevated MCV 01/28/2023   Macrocytic anemia 01/28/2023   Elevated transaminase level 01/28/2023   Hyperbilirubinemia 01/28/2023   Gout 01/28/2023   HLD (hyperlipidemia) 12/11/2022   Hypertriglyceridemia 12/11/2022   Family history of coronary artery disease 12/11/2022   Peripheral neuropathy 12/11/2022   Leg swelling 12/11/2022   Hypoalbuminemia 12/11/2022    ONSET DATE: March 2024  REFERRING DIAG: 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  Rationale for Evaluation and Treatment: Rehabilitation  SUBJECTIVE:                                                                                                                                                                                              SUBJECTIVE STATEMENT: Still reports of pain on B LE = 9/10. Denies falls. Patient reports that he was so tired after the last session that he did not get out of the recliner for 2 hours. Patient states that he's still extremely fatigued. Borg's RPE = 8/10. Patient states that he did his HEP yesterday and climb 2 flights of stairs. Patient states that he's losing appetite.   EVAL: Arrives to the clinic with c/o difficulty with walking, weakness on B LE (R>L) and some unsteadiness especially when standing with his eyes closed. Uses a rolling  walker at times. Patient also reports that he gets tired easily and feels numb on B UE/LE. Patient also states that his grip is weak. Condition started March 2024 when patient had pains on the hip without apparent reason  which gradually got worse until the pain extended to the feet and hands. Condition got worse in May 2024 where pain was so severe that patient cannot work anymore. Patient also had a seizure in June 2024 and was admitted to the hospital where patient had PT. Patient was D/C and had PT at home for 2 months. In July 2024, MD told him that he may have CIDP. Patient was given Prednisone 80 mg just recently and will be tapered down weekly. Patient was then referred to outpatient PT evaluation and management. Pt accompanied by: self  PERTINENT HISTORY: seizure disorder, gout  PAIN:  Are you having pain? Yes: NPRS scale: 9/10 Pain location: hands/legs Pain description: sharp, burning, throbbing Aggravating factors: laying in bed at night Relieving factors: change position and movement  PRECAUTIONS: Fall  RED FLAGS: Bowel or bladder incontinence: No and Spinal tumors: No   WEIGHT BEARING RESTRICTIONS: No  FALLS: Has patient fallen in last 6 months? Yes. Number of falls 4 (last was in June 2024)  LIVING  ENVIRONMENT: Lives with:  lives with mother Lives in: House/apartment Stairs: Yes: Internal: 14 steps; on right going up and External: 4 steps; bilateral but cannot reach both Has following equipment at home: Dan Humphreys - 2 wheeled  PLOF: Independent, Independent with basic ADLs, and used to be a shift leader at [employer]  PATIENT GOALS: "try to get my balance so I go back to work"  OBJECTIVE:   DIAGNOSTIC FINDINGS:  NEUROIMAGING REPORT   STUDY DATE: 02/05/2023 ... Impression This MRI of the cervical spine without contrast shows the  following:  The spinal cord appears normal.   There is no critical spinal stenosis.  At C6-C7 there are degenerative changes combining to cause mild spinal  stenosis and left greater than right foraminal narrowing.  Although there  is no definite nerve root compression the degenerative changes encroach  upon the C7 nerve roots.  Milder degenerative changes at the other cervical levels as detailed above  do not lead to spinal stenosis or nerve root compression. ...  NEUROIMAGING REPORT   STUDY DATE: 02/05/2023 ... Impression This MRI of the lumbar spine without contrast shows the  following:  The terminal spinal cord and the conus medullary medullaris appear normal.  Degenerative changes at L3-L4, L4-L5 and L5-S1 are noted but do not lead  to spinal stenosis or nerve root compression.  COGNITION: Overall cognitive status: Within functional limits for tasks assessed   SENSATION: Light touch: Impaired  on B LE  MUSCLE LENGTH: Hamstrings: mild restriction on B Gastrocnemius: mild restriction on B  POSTURE: rounded shoulders, forward head, decreased lumbar lordosis, and knees slightly flexed (standing)  LOWER EXTREMITY ROM:     Active  Right Eval Left Eval  Hip flexion [facility]  Hip extension    Hip abduction [facility]  Hip adduction [facility]  Hip internal rotation    Hip external rotation    Knee flexion [facility]  Knee extension [facility]   Ankle dorsiflexion [facility]  Ankle plantarflexion [facility] WFL  Ankle inversion    Ankle eversion     (Blank rows = not tested)  LOWER EXTREMITY MMT:    MMT Right Eval Left Eval  Hip flexion 3+ 3+  Hip extension    Hip abduction 4- 3+  Hip adduction 4- 4-  Hip internal rotation    Hip external rotation    Knee flexion 3+ 3+  Knee extension 3+ 3+  Ankle dorsiflexion 3+ 3+  Ankle plantarflexion 3+ 3+  Ankle inversion    Ankle eversion    (Blank rows = not tested)  TRANSFERS: Assistive device utilized: None  Sit to stand: SBA Stand to sit: SBA  STAIRS: Level of Assistance: SBA Stair Negotiation Technique: Step to Pattern with Single Rail on Right Number of Stairs: 4  Height of Stairs: 7"  Comments: Wide BOS, slightly staggering, knees slightly flexed, slightly unsteady  GAIT: Gait pattern: decreased arm swing- Right, decreased arm swing- Left, decreased step length- Right, decreased step length- Left, decreased stride length, decreased ankle dorsiflexion- Right, decreased ankle dorsiflexion- Left, knee flexed in stance- Right, knee flexed in stance- Left, decreased trunk rotation, trunk flexed, wide BOS, poor foot clearance- Right, and poor foot clearance- Left Distance walked: 140 ft Assistive device utilized: None Level of assistance: CGA Comments: mildly unsteady  FUNCTIONAL TESTS:  5 times sit to stand: 34.43 sec 2 minute walk test: 140 ft Dynamic Gait Index: 8  DGI 1. Gait level surface (1) Moderate Impairment: Walks 20', slow speed, abnormal gait pattern, evidence for imbalance. 2. Change in gait speed (1) Moderate Impairment: Makes only minor adjustments to walking speed, or accomplishes a change in speed with significant gait deviations, or changes speed but has significant gait deviations, or changes speed but loses balance but is able to recover and continue walking. 3. Gait with horizontal head turns (1) Moderate Impairment: Performs head turns with moderate  change in gait velocity, slows down, staggers but recovers, can continue to walk. 4. Gait with vertical head turns 1) Moderate Impairment: Performs head turns with moderate change in gait velocity, slows down, staggers but recovers, can continue to walk. 5. Gait and pivot turn (1) Moderate Impairment: Turns slowly, requires verbal cueing, requires several small steps to catch balance following turn and stop. 6. Step over obstacle (1) Moderate Impairment: Is able to step over box but must stop, then step over. May require verbal cueing. 7. Step around obstacles (1) Moderate Impairment: Is able to clear cones but must significantly slow, speed to accomplish task, or requires verbal cueing. 8. Stairs (1) Moderate Impairment: Two feet to a stair, must use rail.  TOTAL SCORE: 8 / 24  PATIENT SURVEYS:  LEFS 6/80 = 7.5%  TODAY'S TREATMENT:                                                                                                                              DATE:  05/16/23 Recumbent stepper, arm 8, seat 13, level 2, > 70 SPM x 5'  Gastrocsoleus slant board stretch x 30" x 3 Standing:  Rocking back and forth with arm raise, one LE forward x 10 x 2 on each  Chop/lift, yellow  med ball, foam, normal, BOS x 10  Tandem stance, firm surface, with push/pull with a dowel x 3" x 10 on each  Head nods/turns, foam, eyes open x 10  Bodycraft standing TKE x 3" x 10 x 2 plates each    13/0/86 Recumbent stepper, arm 8, seat 13, level 2, > 70 SPM x 5'  Gastrocsoleus slant board stretch x 30" x 3 Standing:  Rocking back and forth with arm raise, one LE forward x 10 x 2 on each  Chop/lift, yellow med ball, foam, normal, BOS x 10  Tandem stance, firm surface, with push/pull with a dowel x 3" x 10 on each  Bodycraft standing TKE x 3" x 10 x 1 plate each  57/8/46 Education on sensory re-education techniques for 5 minutes beginning with softer textures like a satin cloth then progressed to rougher  texture like a towel (gave excellent verbal understanding) Recumbent stepper, arm 8, seat 13, level 2, > 70 SPM x 5'  Gastrocsoleus slant board stretch x 30" x 3 Standing:  Heel raises x 10 x 2 x 2 lbs  Toe raises x 10 x 2 x 2 lbs  High marches x 10 x 2 x 2 lbs  Rocking back and forth with arm raise, one LE forward x 10 x 2 on each  Chop/lift, yellow med ball, foam, normal, BOS x 10  Tandem stance, firm surface, with push/pull with a dowel x 3" x 10 x 2 on each  Resisted walking backwards/forwards with bodycraft, 2 plates x 2 rounds  05/02/23 Recumbent stepper, arm 8, seat 13, level 3, > 60 SPM x 5'  Gastrocsoleus slant board stretch x 30" x 3 Standing:  Mini squats, yellow med ball x 3" x 10 x 2  Heel raises x 10 x 2  Toe raises x 10 x 2  High marches x 10 x 2  Rhythmic stabilization, foam, normal BOS, eyes open x ant/post x 3" x 10 x 2  Chop/lift, yellow med ball, foam, normal, BOS x 10  Tandem stance, firm surface, eyes open x 30" x 2  TKE, Bodycraft, 1 plate x 10 x 2 x 3" each  Resisted walking backwards with bodycraft, 2 plates x 3 rounds  04/25/23 Recumbent stepper, arm 8, seat 13, level 1, > 60 SPM x 5'  Gastrocsoleus slant board stretch x 30" x 3 Standing:  Rhythmic stabilization, firm surface, normal BOS, eyes open x ant/post x 3" x 10 x 2  Yellow med ball raises, firm surface, normal, BOS, eyes open, x 10  Tandem stance, firm surface, eyes open x 30" x 2  TKE, RTB x 10 x 2 x 3"  Resisted walking backwards with bodycraft, 2 plates x 5 rounds  04/24/23 Recumbent stepper, arm 8, seat 13, level 1, > 60 SPM x 5' Seated Gastrocsoleus stretch with a strap x 30" x 3 Standing:  Mini squats x 3" x 10 x 2  Hip vectors, RTB x 10 x 2  Heel/toe raises x 10 x 2  TKE, RTB x 10 x 2 x 3"  04/17/23 Evaluation and patient education done Gait training with a quad cane with CGA for around 80 ft    PATIENT EDUCATION: Education details: Updated written HEP Person educated:  Patient Education method: Explanation Education comprehension: verbalized understanding  HOME EXERCISE PROGRAM: Access Code: [email or web address] URL: [email or web address] 05/02/2023 - Standing March with Counter Support  - 1-2 x daily - 7 x weekly - 2 sets - 10 reps  04/25/2023 - Tandem Stance with Support  - 1-2 x daily - 7 x weekly - 2 reps - 30 hold  Date: 04/24/2023 Prepared by: Krystal Clark  Exercises - Seated Calf Stretch with Strap  - 1-2 x daily - 7 x weekly - 3 reps - 30 hold - Mini Squat with Counter Support  - 1-2 x daily - 7 x weekly - 2 sets - 10 reps - 3 hold - Standing 3-Way Leg Reach with Resistance at Ankles and Counter Support  - 1-2 x daily - 7 x weekly - 2 sets - 10 reps - Heel Toe Raises with Counter Support  - 1-2 x daily - 7 x weekly - 2 sets - 10 reps  GOALS: Goals reviewed with patient? Yes  SHORT TERM GOALS: Target date: 05/15/23  Pt will demonstrate indep in HEP to facilitate carry-over of skilled services and improve functional outcomes Goal status: INITIAL  2.  Pt will decrease 5TSTS by at least 3 seconds in order to demonstrate clinically significant improvement in LE strength   Baseline: 34.43 sec Goal status: INITIAL  3.  Pt will increase by at least 40 ft in order to demonstrate clinically significant improvement in community ambulation Baseline: 140 ft Goal status: INITIAL  4.  Pt will increase LEFS by at least 9 points in order to demonstrate significant improvement in lower extremity function.  Baseline: 6 Goal status: INITIAL  LONG TERM GOALS: Target date: 07/10/23  Pt will decrease 5TSTS by at least 6 seconds in order to demonstrate clinically significant improvement in LE strength   Baseline: 34.43 sec Goal status: INITIAL  2.  Pt will increase by at least 80 ft in order to demonstrate clinically significant improvement in community ambulation Baseline: 140 ft Goal status: INITIAL  3.  Pt will increase LEFS by  at least 18 points in order to demonstrate significant improvement in lower extremity function.  Baseline: 6 Goal status: INITIAL  4.  Pt will demonstrate increase in LE strength to 4 to facilitate ease and safety in ambulation  Baseline: 3+ Goal status: INITIAL  5.  Pt will improve DGI by at least 3 points in order to demonstrate clinically significant improvement in balance and decreased risk for falls  Baseline: 8 Goal status: INITIAL  ASSESSMENT:  CLINICAL IMPRESSION: Interventions today were geared towards balance, LE strengthening and flexibility. Tolerated all activities with increased fatigue. Patient still demonstrated mod to severe levels of fatigue (RPE = 9.5/10). Pacing of activities was still slow. Frequent and prolonged rest periods given. Still demonstrated milds unsteadiness seen with rocking back/forth and head turns as well as during the chop/lift due to impaired proprioception. Provided slight amount of cueing to ensure correct execution of activity with good carry-over. To date, skilled PT is required to address the impairments and improve function. Patient may need to speak with referring provider for the loss of appetite  EVAL: Patient is a 47 y.o. male who was seen today for physical therapy evaluation and treatment for Chronic Inflammatory Demyelinating Polyneuritis (CIDP). Patient was diagnosed with CIDP by referring provider further defined by difficulty with ambulation and negotiating stairs due to pain, weakness, impaired balance/proprioception and decreased soft tissue extensibility. Skilled PT is required to address the impairments and functional limitations listed below. Due to weakness in grip, patient may also need OT to address patient's issues with ADLs.Interventions today were geared towards gait training for safety. Tolerated all activities with improved confidence with safety when ambulating. May use  a quad cane as needed. Demonstrated mild levels of fatigue.  Rest periods provided. Provided slight amount of cueing to ensure correct execution of activity with good carry-over.      OBJECTIVE IMPAIRMENTS: Abnormal gait, decreased activity tolerance, decreased balance, decreased endurance, decreased mobility, difficulty walking, decreased strength, impaired flexibility, impaired sensation, and pain.   ACTIVITY LIMITATIONS: carrying, lifting, bending, sitting, standing, squatting, stairs, transfers, bed mobility, continence, bathing, toileting, dressing, self feeding, reach over head, hygiene/grooming, locomotion level, and caring for others  PARTICIPATION LIMITATIONS: meal prep, cleaning, laundry, driving, shopping, community activity, occupation, and yard work  PERSONAL FACTORS: Time since onset of injury/illness/exacerbation and nature of the condition  are also affecting patient's functional outcome.   REHAB POTENTIAL: Fair    CLINICAL DECISION MAKING: Evolving/moderate complexity  EVALUATION COMPLEXITY: Moderate  PLAN:  PT FREQUENCY: 2x/week  PT DURATION: 12 weeks  PLANNED INTERVENTIONS: Therapeutic exercises, Therapeutic activity, Neuromuscular re-education, Balance training, Gait training, Patient/Family education, Self Care, Stair training, Orthotic/Fit training, Electrical stimulation, Manual therapy, and Re-evaluation  PLAN FOR NEXT SESSION: Continue POC and may progress as tolerated with emphasis on LE flexibility, functional strengthening, balance and gait activities. Re-assess next visit.   Tish Frederickson. Kaleena Corrow, PT, DPT, OCS Board-Certified Clinical Specialist in Orthopedic PT PT Compact Privilege # ([place]): [id number] T 05/16/2023, 8:03 AM

## 2023-05-21 ENCOUNTER — Ambulatory Visit (HOSPITAL_COMMUNITY): Payer: 59

## 2023-05-21 DIAGNOSIS — R29898 Other symptoms and signs involving the musculoskeletal system: Secondary | ICD-10-CM

## 2023-05-21 DIAGNOSIS — R262 Difficulty in walking, not elsewhere classified: Secondary | ICD-10-CM

## 2023-05-21 DIAGNOSIS — M79604 Pain in right leg: Secondary | ICD-10-CM

## 2023-05-21 NOTE — Therapy (Signed)
OUTPATIENT PHYSICAL THERAPY NEURO PROGRESS NOTE   Patient Name: Connor Copeland MRN: [id number] DOB:05-08-1976, 47 y.o., male Today's Date: 05/21/2023  PCP: none on file REFERRING PROVIDER: Benita Stabile, MD  END OF SESSION:  PT End of Session - 05/21/23 0951     Visit Number 8    Number of Visits 24    Date for PT Re-Evaluation 07/10/23    Authorization Type [employer] (no auth, no limit, no copay)    Progress Note Due on Visit 16    PT Start Time 0800    PT Stop Time 0845    PT Time Calculation (min) 45 min    Equipment Utilized During Treatment Gait belt    Activity Tolerance Patient limited by pain;Patient limited by fatigue    Behavior During Therapy [facility] for tasks assessed/performed            Past Medical History:  Diagnosis Date   ETOH abuse    Gout    Past Surgical History:  Procedure Laterality Date   FOOT SURGERY     Patient Active Problem List   Diagnosis Date Noted   Seizure disorder (HCC) 03/19/2023   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   Neuropathic pain 03/19/2023   Delirium tremens (HCC) 01/29/2023   Alcohol withdrawal seizure (HCC) 01/28/2023   Alcohol abuse 01/28/2023   Tobacco use disorder 01/28/2023   Hyponatremia 01/28/2023   Elevated MCV 01/28/2023   Macrocytic anemia 01/28/2023   Elevated transaminase level 01/28/2023   Hyperbilirubinemia 01/28/2023   Gout 01/28/2023   HLD (hyperlipidemia) 12/11/2022   Hypertriglyceridemia 12/11/2022   Family history of coronary artery disease 12/11/2022   Peripheral neuropathy 12/11/2022   Leg swelling 12/11/2022   Hypoalbuminemia 12/11/2022  Progress Note Reporting Period 9/12/ to 05/21/23  See note below for Objective Data and Assessment of Progress/Goals.    ONSET DATE: March 2024  REFERRING DIAG: G61.81 (ICD-10-CM) - Chronic inflammatory demyelinating polyneuritis  THERAPY DIAG:  Difficulty in walking, not elsewhere  classified  Weakness of both lower extremities  Pain in both lower extremities  Rationale for Evaluation and Treatment: Rehabilitation  SUBJECTIVE:                                                                                                                                                                                             SUBJECTIVE STATEMENT: Patient had an episode of a mechanical fall 4 days ago but was able to catch himself. Patient still reports of pain = 9/10 on B LE. Patient states that he's a little bit more energetic today and has been having his protein  shake which he thinks has helped a lot with his energy. Patient states that he's 10% better. Patient states that he has been doing his HEP  EVAL: Arrives to the clinic with c/o difficulty with walking, weakness on B LE (R>L) and some unsteadiness especially when standing with his eyes closed. Uses a rolling walker at times. Patient also reports that he gets tired easily and feels numb on B UE/LE. Patient also states that his grip is weak. Condition started March 2024 when patient had pains on the hip without apparent reason  which gradually got worse until the pain extended to the feet and hands. Condition got worse in May 2024 where pain was so severe that patient cannot work anymore. Patient also had a seizure in June 2024 and was admitted to the hospital where patient had PT. Patient was D/C and had PT at home for 2 months. In July 2024, MD told him that he may have CIDP. Patient was given Prednisone 80 mg just recently and will be tapered down weekly. Patient was then referred to outpatient PT evaluation and management. Pt accompanied by: self  PERTINENT HISTORY: seizure disorder, gout  PAIN:  Are you having pain? Yes: NPRS scale: 9/10 Pain location: hands/legs Pain description: sharp, burning, throbbing Aggravating factors: laying in bed at night Relieving factors: change position and movement  PRECAUTIONS:  Fall  RED FLAGS: Bowel or bladder incontinence: No and Spinal tumors: No   WEIGHT BEARING RESTRICTIONS: No  FALLS: Has patient fallen in last 6 months? Yes. Number of falls 4 (last was in June 2024)  LIVING ENVIRONMENT: Lives with:  lives with mother Lives in: House/apartment Stairs: Yes: Internal: 14 steps; on right going up and External: 4 steps; bilateral but cannot reach both Has following equipment at home: Dan Humphreys - 2 wheeled  PLOF: Independent, Independent with basic ADLs, and used to be a shift leader at [employer]  PATIENT GOALS: "try to get my balance so I go back to work"  OBJECTIVE: All findings are from the initial evaluation unless otherwise dated  DIAGNOSTIC FINDINGS:  NEUROIMAGING REPORT   STUDY DATE: 02/05/2023 ... Impression This MRI of the cervical spine without contrast shows the  following:  The spinal cord appears normal.   There is no critical spinal stenosis.  At C6-C7 there are degenerative changes combining to cause mild spinal  stenosis and left greater than right foraminal narrowing.  Although there  is no definite nerve root compression the degenerative changes encroach  upon the C7 nerve roots.  Milder degenerative changes at the other cervical levels as detailed above  do not lead to spinal stenosis or nerve root compression. ...  NEUROIMAGING REPORT   STUDY DATE: 02/05/2023 ... Impression This MRI of the lumbar spine without contrast shows the  following:  The terminal spinal cord and the conus medullary medullaris appear normal.  Degenerative changes at L3-L4, L4-L5 and L5-S1 are noted but do not lead  to spinal stenosis or nerve root compression.  COGNITION: Overall cognitive status: Within functional limits for tasks assessed   SENSATION: Light touch: Impaired  on B LE  MUSCLE LENGTH: Hamstrings: mild restriction on B Gastrocnemius: mild restriction on B  POSTURE: rounded shoulders, forward head, decreased lumbar lordosis, and  knees slightly flexed (standing)  LOWER EXTREMITY ROM:     Active  Right Eval Left Eval  Hip flexion [facility]  Hip extension    Hip abduction [facility]  Hip adduction [facility]  Hip internal  rotation    Hip external rotation    Knee flexion [facility]  Knee extension [facility]  Ankle dorsiflexion [facility]  Ankle plantarflexion [facility]  Ankle inversion    Ankle eversion     (Blank rows = not tested)  LOWER EXTREMITY MMT:    MMT Right Eval Left Eval Right 05/21/23 Left 05/21/23  Hip flexion 3+ 3+ 4- 3+  Hip extension      Hip abduction 4- 3+ 4-4- 3+  Hip adduction 4- 4-  4-  Hip internal rotation      Hip external rotation      Knee flexion 3+ 3+ 4- 3+  Knee extension 3+ 3+ 4- 3+  Ankle dorsiflexion 3+ 3+ 4- 4-  Ankle plantarflexion 3+ 3+ 4- 3+  Ankle inversion      Ankle eversion      (Blank rows = not tested)  TRANSFERS: Assistive device utilized: None  Sit to stand: SBA Stand to sit: SBA  STAIRS: 05/21/23 Level of Assistance: SBA Stair Negotiation Technique: Alternating Pattern  with Single Rail on Right Number of Stairs: 4  Height of Stairs: 7"  Comments: Wide BOS, slightly staggering, knees slightly flexed, slightly unsteady  GAIT: Gait pattern: decreased arm swing- Right, decreased arm swing- Left, decreased step length- Right, decreased step length- Left, decreased stride length, decreased ankle dorsiflexion- Right, decreased ankle dorsiflexion- Left, knee flexed in stance- Right, knee flexed in stance- Left, decreased trunk rotation, trunk flexed, wide BOS, poor foot clearance- Right, and poor foot clearance- Left Distance walked: 140 ft Assistive device utilized: None Level of assistance: CGA Comments: mildly unsteady  FUNCTIONAL TESTS:  5 times sit to stand: 05/21/23: 33.77 sec from 34.43 sec 2 minute walk test: 05/21/23: 227 ft from 140 ft Dynamic Gait Index: 05/21/23: 10 from 8  DGI 05/21/23 1. Gait level surface (1) Moderate Impairment: Walks 20',  slow speed, abnormal gait pattern, evidence for imbalance. 2. Change in gait speed (1) Moderate Impairment: Makes only minor adjustments to walking speed, or accomplishes a change in speed with significant gait deviations, or changes speed but has significant gait deviations, or changes speed but loses balance but is able to recover and continue walking. 3. Gait with horizontal head turns (1) Moderate Impairment: Performs head turns with moderate change in gait velocity, slows down, staggers but recovers, can continue to walk. 4. Gait with vertical head turns 1) Moderate Impairment: Performs head turns with moderate change in gait velocity, slows down, staggers but recovers, can continue to walk. 5. Gait and pivot turn (1) Moderate Impairment: Turns slowly, requires verbal cueing, requires several small steps to catch balance following turn and stop. 6. Step over obstacle (1) Moderate Impairment: Is able to step over box but must stop, then step over. May require verbal cueing. 7. Step around obstacles (2) Mild Impairment: Is able to step around both cones, but must slow down and adjust steps to clear cones. 8. Stairs (2) Mild Impairment: Alternating feet, must use rail.  TOTAL SCORE: 10 / 24  DGI (evaluation) 1. Gait level surface (1) Moderate Impairment: Walks 20', slow speed, abnormal gait pattern, evidence for imbalance. 2. Change in gait speed (1) Moderate Impairment: Makes only minor adjustments to walking speed, or accomplishes a change in speed with significant gait deviations, or changes speed but has significant gait deviations, or changes speed but loses balance but is able to recover and continue walking. 3. Gait with horizontal head turns (1) Moderate Impairment: Performs head turns with  moderate change in gait velocity, slows down, staggers but recovers, can continue to walk. 4. Gait with vertical head turns 1) Moderate Impairment: Performs head turns with moderate change in  gait velocity, slows down, staggers but recovers, can continue to walk. 5. Gait and pivot turn (1) Moderate Impairment: Turns slowly, requires verbal cueing, requires several small steps to catch balance following turn and stop. 6. Step over obstacle (1) Moderate Impairment: Is able to step over box but must stop, then step over. May require verbal cueing. 7. Step around obstacles (1) Moderate Impairment: Is able to clear cones but must significantly slow, speed to accomplish task, or requires verbal cueing. 8. Stairs (1) Moderate Impairment: Two feet to a stair, must use rail.  TOTAL SCORE: 8 / 24  PATIENT SURVEYS:  LEFS 05/21/23 8/80 = 10% from 6/80 = 7.5%  TODAY'S TREATMENT:                                                                                                                              DATE:  05/21/23 Progress note (5TSTS, , DGI, MMT, LEFS) Recumbent stepper, arm 8, seat 13, level 3, > 60 SPM x 5'  Gastrocsoleus slant board stretch x 30" x 3  05/16/23 Recumbent stepper, arm 8, seat 13, level 2, > 70 SPM x 5'  Gastrocsoleus slant board stretch x 30" x 3 Standing:  Rocking back and forth with arm raise, one LE forward x 10 x 2 on each  Chop/lift, yellow med ball, foam, normal, BOS x 10  Tandem stance, firm surface, with push/pull with a dowel x 3" x 10 on each  Head nods/turns, foam, eyes open x 10  Bodycraft standing TKE x 3" x 10 x 2 plates each    53/6/64 Recumbent stepper, arm 8, seat 13, level 2, > 70 SPM x 5'  Gastrocsoleus slant board stretch x 30" x 3 Standing:  Rocking back and forth with arm raise, one LE forward x 10 x 2 on each  Chop/lift, yellow med ball, foam, normal, BOS x 10  Tandem stance, firm surface, with push/pull with a dowel x 3" x 10 on each  Bodycraft standing TKE x 3" x 10 x 1 plate each  40/3/47 Education on sensory re-education techniques for 5 minutes beginning with softer textures like a satin cloth then progressed to rougher  texture like a towel (gave excellent verbal understanding) Recumbent stepper, arm 8, seat 13, level 2, > 70 SPM x 5'  Gastrocsoleus slant board stretch x 30" x 3 Standing:  Heel raises x 10 x 2 x 2 lbs  Toe raises x 10 x 2 x 2 lbs  High marches x 10 x 2 x 2 lbs  Rocking back and forth with arm raise, one LE forward x 10 x 2 on each  Chop/lift, yellow med ball, foam, normal, BOS x 10  Tandem stance, firm surface, with push/pull with a dowel x 3" x 10  x 2 on each  Resisted walking backwards/forwards with bodycraft, 2 plates x 2 rounds  05/02/23 Recumbent stepper, arm 8, seat 13, level 3, > 60 SPM x 5'  Gastrocsoleus slant board stretch x 30" x 3 Standing:  Mini squats, yellow med ball x 3" x 10 x 2  Heel raises x 10 x 2  Toe raises x 10 x 2  High marches x 10 x 2  Rhythmic stabilization, foam, normal BOS, eyes open x ant/post x 3" x 10 x 2  Chop/lift, yellow med ball, foam, normal, BOS x 10  Tandem stance, firm surface, eyes open x 30" x 2  TKE, Bodycraft, 1 plate x 10 x 2 x 3" each  Resisted walking backwards with bodycraft, 2 plates x 3 rounds  04/25/23 Recumbent stepper, arm 8, seat 13, level 1, > 60 SPM x 5'  Gastrocsoleus slant board stretch x 30" x 3 Standing:  Rhythmic stabilization, firm surface, normal BOS, eyes open x ant/post x 3" x 10 x 2  Yellow med ball raises, firm surface, normal, BOS, eyes open, x 10  Tandem stance, firm surface, eyes open x 30" x 2  TKE, RTB x 10 x 2 x 3"  Resisted walking backwards with bodycraft, 2 plates x 5 rounds  04/24/23 Recumbent stepper, arm 8, seat 13, level 1, > 60 SPM x 5' Seated Gastrocsoleus stretch with a strap x 30" x 3 Standing:  Mini squats x 3" x 10 x 2  Hip vectors, RTB x 10 x 2  Heel/toe raises x 10 x 2  TKE, RTB x 10 x 2 x 3"  04/17/23 Evaluation and patient education done Gait training with a quad cane with CGA for around 80 ft    PATIENT EDUCATION: Education details: Updated written HEP Person educated:  Patient Education method: Explanation Education comprehension: verbalized understanding  HOME EXERCISE PROGRAM: Access Code: [email or web address] URL: [email or web address] 05/02/2023 - Standing March with Counter Support  - 1-2 x daily - 7 x weekly - 2 sets - 10 reps  04/25/2023 - Tandem Stance with Support  - 1-2 x daily - 7 x weekly - 2 reps - 30 hold  Date: 04/24/2023 Prepared by: Krystal Clark  Exercises - Seated Calf Stretch with Strap  - 1-2 x daily - 7 x weekly - 3 reps - 30 hold - Mini Squat with Counter Support  - 1-2 x daily - 7 x weekly - 2 sets - 10 reps - 3 hold - Standing 3-Way Leg Reach with Resistance at Ankles and Counter Support  - 1-2 x daily - 7 x weekly - 2 sets - 10 reps - Heel Toe Raises with Counter Support  - 1-2 x daily - 7 x weekly - 2 sets - 10 reps  GOALS: Goals reviewed with patient? Yes  SHORT TERM GOALS: Target date: 06/11/23  Pt will demonstrate indep in HEP to facilitate carry-over of skilled services and improve functional outcomes Goal status: MET  2.  Pt will decrease 5TSTS by at least 3 seconds in order to demonstrate clinically significant improvement in LE strength   Baseline: 33.77 sec Goal status: IN PROGRESS  3.  Pt will increase by at least 40 ft in order to demonstrate clinically significant improvement in community ambulation Baseline: 277 ft Goal status: IN PROGRESS  4.  Pt will increase LEFS by at least 9 points in order to demonstrate significant improvement in lower extremity function.  Baseline: 8 Goal status: IN PROGRESS  LONG TERM  GOALS: Target date: 07/10/23  Pt will decrease 5TSTS by at least 6 seconds in order to demonstrate clinically significant improvement in LE strength   Baseline: 33.77 sec Goal status: IN PROGRESS  2.  Pt will increase by at least 80 ft in order to demonstrate clinically significant improvement in community ambulation Baseline: 277 ft Goal status: IN PROGRESS  3.  Pt will  increase LEFS by at least 18 points in order to demonstrate significant improvement in lower extremity function.  Baseline: 8 Goal status: IN PROGRESS  4.  Pt will demonstrate increase in LE strength to 4 to facilitate ease and safety in ambulation  Baseline: 3+ Goal status: IN PROGRESS  5.  Pt will improve DGI by at least 3 points in order to demonstrate clinically significant improvement in balance and decreased risk for falls  Baseline: 10 Goal status:IN PROGRESS  ASSESSMENT:  CLINICAL IMPRESSION: PROGRESS NOTE 05/21/23: Patient demonstrated continued improvements in function as indicated by positive significant changes in activity tolerance as indicated by improvement in . However, patient still presents with deficits in balance, gait, and strength as indicated by DGI. With this, skilled PT is still required to address the impairments and functional limitations listed below. Interventions today were geared towards LE strengthening and flexibility. Tolerated all activities with mild fatigue. Pacing of activities was still slow. Rest periods provided. Provided slight amount of cueing to ensure correct execution of activity with good carry-over. To date, skilled PT is required to address the impairments and improve function.   EVAL: Patient is a 47 y.o. male who was seen today for physical therapy evaluation and treatment for Chronic Inflammatory Demyelinating Polyneuritis (CIDP). Patient was diagnosed with CIDP by referring provider further defined by difficulty with ambulation and negotiating stairs due to pain, weakness, impaired balance/proprioception and decreased soft tissue extensibility. Skilled PT is required to address the impairments and functional limitations listed below. Due to weakness in grip, patient may also need OT to address patient's issues with ADLs.Interventions today were geared towards gait training for safety. Tolerated all activities with improved confidence with  safety when ambulating. May use a quad cane as needed. Demonstrated mild levels of fatigue. Rest periods provided. Provided slight amount of cueing to ensure correct execution of activity with good carry-over.      OBJECTIVE IMPAIRMENTS: Abnormal gait, decreased activity tolerance, decreased balance, decreased endurance, decreased mobility, difficulty walking, decreased strength, impaired flexibility, impaired sensation, and pain.   ACTIVITY LIMITATIONS: carrying, lifting, bending, sitting, standing, squatting, stairs, transfers, bed mobility, continence, bathing, toileting, dressing, self feeding, reach over head, hygiene/grooming, locomotion level, and caring for others  PARTICIPATION LIMITATIONS: meal prep, cleaning, laundry, driving, shopping, community activity, occupation, and yard work  PERSONAL FACTORS: Time since onset of injury/illness/exacerbation and nature of the condition  are also affecting patient's functional outcome.   REHAB POTENTIAL: Fair    CLINICAL DECISION MAKING: Evolving/moderate complexity  EVALUATION COMPLEXITY: Moderate  PLAN:  PT FREQUENCY: 2x/week  PT DURATION: 8 weeks  PLANNED INTERVENTIONS: Therapeutic exercises, Therapeutic activity, Neuromuscular re-education, Balance training, Gait training, Patient/Family education, Self Care, Stair training, Orthotic/Fit training, Electrical stimulation, Manual therapy, and Re-evaluation  PLAN FOR NEXT SESSION: Continue POC and may progress as tolerated with emphasis on LE flexibility, functional strengthening, balance and gait activities.    Tish Frederickson. Shreyansh Tiffany, PT, DPT, OCS Board-Certified Clinical Specialist in Orthopedic PT PT Compact Privilege # ([place]): [id number] T 05/21/2023, 9:52 AM

## 2023-05-23 ENCOUNTER — Encounter (HOSPITAL_COMMUNITY): Payer: 59

## 2023-05-23 ENCOUNTER — Telehealth (HOSPITAL_COMMUNITY): Payer: Self-pay

## 2023-05-23 NOTE — Telephone Encounter (Signed)
Called patient today for his 2nd no show. Patient states that he was not feeling good today and did not sleep good last night. Patient reminded of the cancellation/no show policy of the facility.  Tish Frederickson. [place] Dalporto, PT, DPT, OCS Board-Certified Clinical Specialist in Orthopedic PT PT Compact Privilege # ([place]): [phone] T

## 2023-05-28 ENCOUNTER — Ambulatory Visit (HOSPITAL_COMMUNITY): Payer: 59

## 2023-05-28 DIAGNOSIS — R262 Difficulty in walking, not elsewhere classified: Secondary | ICD-10-CM

## 2023-05-28 DIAGNOSIS — R29898 Other symptoms and signs involving the musculoskeletal system: Secondary | ICD-10-CM

## 2023-05-28 DIAGNOSIS — M79605 Pain in left leg: Secondary | ICD-10-CM

## 2023-05-28 NOTE — Therapy (Signed)
OUTPATIENT PHYSICAL THERAPY NOTE   Patient Name: Connor Copeland MRN: [id number] DOB:03-18-1976, 47 y.o., male Today's Date: 05/28/2023  PCP: none on file REFERRING PROVIDER: Benita Stabile, MD  END OF SESSION:  PT End of Session - 05/28/23 0850     Visit Number 9    Number of Visits 24    Date for PT Re-Evaluation 07/10/23    Authorization Type [employer] (no auth, no limit, no copay)    Progress Note Due on Visit 16    PT Start Time 0847    PT Stop Time 0927    PT Time Calculation (min) 40 min    Equipment Utilized During Treatment Gait belt    Activity Tolerance Patient limited by fatigue;Patient tolerated treatment well    Behavior During Therapy WFL for tasks assessed/performed            Past Medical History:  Diagnosis Date   ETOH abuse    Gout    Past Surgical History:  Procedure Laterality Date   FOOT SURGERY     Patient Active Problem List   Diagnosis Date Noted   Seizure disorder (HCC) 03/19/2023   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   Neuropathic pain 03/19/2023   Delirium tremens (HCC) 01/29/2023   Alcohol withdrawal seizure (HCC) 01/28/2023   Alcohol abuse 01/28/2023   Tobacco use disorder 01/28/2023   Hyponatremia 01/28/2023   Elevated MCV 01/28/2023   Macrocytic anemia 01/28/2023   Elevated transaminase level 01/28/2023   Hyperbilirubinemia 01/28/2023   Gout 01/28/2023   HLD (hyperlipidemia) 12/11/2022   Hypertriglyceridemia 12/11/2022   Family history of coronary artery disease 12/11/2022   Peripheral neuropathy 12/11/2022   Leg swelling 12/11/2022   Hypoalbuminemia 12/11/2022  Progress Note Reporting Period 9/12/ to 05/21/23  See note below for Objective Data and Assessment of Progress/Goals.    ONSET DATE: March 2024  REFERRING DIAG: G61.81 (ICD-10-CM) - Chronic inflammatory demyelinating polyneuritis  THERAPY DIAG:  Difficulty in walking, not elsewhere classified  Weakness  of both lower extremities  Pain in both lower extremities  Rationale for Evaluation and Treatment: Rehabilitation  SUBJECTIVE:                                                                                                                                                                                             SUBJECTIVE STATEMENT: Pt feeling a little better since fall 2 weeks back. Pain remains high. HEP remains in affect. No additional updates, still on high dose prednisone.   EVAL: Arrives to the clinic with c/o difficulty with walking, weakness on B LE (R>L) and some unsteadiness  especially when standing with his eyes closed. Uses a rolling walker at times. Patient also reports that he gets tired easily and feels numb on B UE/LE. Patient also states that his grip is weak. Condition started March 2024 when patient had pains on the hip without apparent reason  which gradually got worse until the pain extended to the feet and hands. Condition got worse in May 2024 where pain was so severe that patient cannot work anymore. Patient also had a seizure in June 2024 and was admitted to the hospital where patient had PT. Patient was D/C and had PT at home for 2 months. In July 2024, MD told him that he may have CIDP. Patient was given Prednisone 80 mg just recently and will be tapered down weekly. Patient was then referred to outpatient PT evaluation and management.  Pt accompanied by: self  PERTINENT HISTORY: PMH: heavy ETOH use, peripheral neuropathy, gout, tobacco use, HLD, alcoholic thrombocytopenia. seizure disorder, gout  PAIN:  Are you having pain? 8.5/10 no major changes but improved since falls.    PRECAUTIONS: Fall   WEIGHT BEARING RESTRICTIONS: No  FALLS: Has patient fallen in last 6 months? Yes. Number of falls 5 (last was in October 2024)  LIVING ENVIRONMENT: Lives with:  lives with mother Lives in: House/apartment Stairs: Yes: Internal: 14 steps; on right going up and  External: 4 steps; bilateral but cannot reach both Has following equipment at home: Dan Humphreys - 2 wheeled  PLOF: Independent, Independent with basic ADLs, and used to be a shift leader at [employer]  PATIENT GOALS: "try to get my balance so I go back to work"  OBJECTIVE:   TODAY'S TREATMENT:                                                                                                                              DATE:   05/28/23: -Nustep AA/ROM, cardio conditioning, seat 13, arms 10, level 3 x 5 minutes (level increased today)  *already stretched calves prior to coming in so this is eschewed   -hands free STS from elevated plinth 1x10 -stabilization marching on dynadisc 1x20 reciprocal pattern  -hands free STS from elevated plinth 1x10 -stabilization marching on dynadisc 1x20 reciprocal pattern   -quite sitting, proprioceptive training on dynadisc, eyes closed 1x30 (too long), then 5x15sec  -4-square stepping pattern for balance training: 73ft fwd, 49ft Right, 4 feet retro (counter support available prn) 77ft left; (5 rounds then a break due to rubbery legs)  Backward walking remains most provoking to imbalance  *rest break  -4-square stepping pattern for balance training: 36ft fwd, 22ft Right, 4 feet retro (counter support available prn) 89ft left; (5 rounds then a break due to rubbery legs)  *rest break   -airex stance rebounding off wall over head x10, off floor x10, then alteranting floor/wall x6  05/21/23 Progress note (5TSTS, , DGI, MMT, LEFS) Recumbent stepper, arm 8, seat 13, level 3, > 60  SPM x 5'  Gastrocsoleus slant board stretch x 30" x 3  05/16/23 Recumbent stepper, arm 8, seat 13, level 2, > 70 SPM x 5'  Gastrocsoleus slant board stretch x 30" x 3 Standing:  Rocking back and forth with arm raise, one LE forward x 10 x 2 on each  Chop/lift, yellow med ball, foam, normal, BOS x 10  Tandem stance, firm surface, with push/pull with a dowel x 3" x 10 on  each  Head nods/turns, foam, eyes open x 10  Bodycraft standing TKE x 3" x 10 x 2 plates each  65/7/84 Recumbent stepper, arm 8, seat 13, level 2, > 70 SPM x 5'  Gastrocsoleus slant board stretch x 30" x 3 Standing:  Rocking back and forth with arm raise, one LE forward x 10 x 2 on each  Chop/lift, yellow med ball, foam, normal, BOS x 10  Tandem stance, firm surface, with push/pull with a dowel x 3" x 10 on each  Bodycraft standing TKE x 3" x 10 x 1 plate each  69/6/29 Education on sensory re-education techniques for 5 minutes beginning with softer textures like a satin cloth then progressed to rougher texture like a towel (gave excellent verbal understanding) Recumbent stepper, arm 8, seat 13, level 2, > 70 SPM x 5'  Gastrocsoleus slant board stretch x 30" x 3 Standing:  Heel raises x 10 x 2 x 2 lbs  Toe raises x 10 x 2 x 2 lbs  High marches x 10 x 2 x 2 lbs  Rocking back and forth with arm raise, one LE forward x 10 x 2 on each  Chop/lift, yellow med ball, foam, normal, BOS x 10  Tandem stance, firm surface, with push/pull with a dowel x 3" x 10 x 2 on each  Resisted walking backwards/forwards with bodycraft, 2 plates x 2 rounds  05/02/23 Recumbent stepper, arm 8, seat 13, level 3, > 60 SPM x 5'  Gastrocsoleus slant board stretch x 30" x 3 Standing:  Mini squats, yellow med ball x 3" x 10 x 2  Heel raises x 10 x 2  Toe raises x 10 x 2  High marches x 10 x 2  Rhythmic stabilization, foam, normal BOS, eyes open x ant/post x 3" x 10 x 2  Chop/lift, yellow med ball, foam, normal, BOS x 10  Tandem stance, firm surface, eyes open x 30" x 2  TKE, Bodycraft, 1 plate x 10 x 2 x 3" each  Resisted walking backwards with bodycraft, 2 plates x 3 rounds  04/25/23 Recumbent stepper, arm 8, seat 13, level 1, > 60 SPM x 5'  Gastrocsoleus slant board stretch x 30" x 3 Standing:  Rhythmic stabilization, firm surface, normal BOS, eyes open x ant/post x 3" x 10 x 2  Yellow med ball raises,  firm surface, normal, BOS, eyes open, x 10  Tandem stance, firm surface, eyes open x 30" x 2  TKE, RTB x 10 x 2 x 3"  Resisted walking backwards with bodycraft, 2 plates x 5 rounds  04/24/23 Recumbent stepper, arm 8, seat 13, level 1, > 60 SPM x 5' Seated Gastrocsoleus stretch with a strap x 30" x 3 Standing:  Mini squats x 3" x 10 x 2  Hip vectors, RTB x 10 x 2  Heel/toe raises x 10 x 2  TKE, RTB x 10 x 2 x 3"  04/17/23 Evaluation and patient education done Gait training with a quad cane with CGA for  around 80 ft    PATIENT EDUCATION: Education details: Updated written HEP Person educated: Patient Education method: Explanation Education comprehension: verbalized understanding  HOME EXERCISE PROGRAM: Access Code: [email or web address] URL: [email or web address] 05/02/2023 - Standing March with Counter Support  - 1-2 x daily - 7 x weekly - 2 sets - 10 reps  04/25/2023 - Tandem Stance with Support  - 1-2 x daily - 7 x weekly - 2 reps - 30 hold  Date: 04/24/2023 Prepared by: Krystal Clark  Exercises - Seated Calf Stretch with Strap  - 1-2 x daily - 7 x weekly - 3 reps - 30 hold - Mini Squat with Counter Support  - 1-2 x daily - 7 x weekly - 2 sets - 10 reps - 3 hold - Standing 3-Way Leg Reach with Resistance at Ankles and Counter Support  - 1-2 x daily - 7 x weekly - 2 sets - 10 reps - Heel Toe Raises with Counter Support  - 1-2 x daily - 7 x weekly - 2 sets - 10 reps  GOALS: Goals reviewed with patient? Yes  SHORT TERM GOALS: Target date: 06/11/23  Pt will demonstrate indep in HEP to facilitate carry-over of skilled services and improve functional outcomes Goal status: MET  2.  Pt will decrease 5TSTS by at least 3 seconds in order to demonstrate clinically significant improvement in LE strength   Baseline: 33.77 sec Goal status: IN PROGRESS  3.  Pt will increase by at least 40 ft in order to demonstrate clinically significant improvement in community  ambulation Baseline: 277 ft Goal status: IN PROGRESS  4.  Pt will increase LEFS by at least 9 points in order to demonstrate significant improvement in lower extremity function.  Baseline: 8 Goal status: IN PROGRESS  LONG TERM GOALS: Target date: 07/10/23  Pt will decrease 5TSTS by at least 6 seconds in order to demonstrate clinically significant improvement in LE strength   Baseline: 33.77 sec Goal status: IN PROGRESS  2.  Pt will increase by at least 80 ft in order to demonstrate clinically significant improvement in community ambulation Baseline: 277 ft Goal status: IN PROGRESS  3.  Pt will increase LEFS by at least 18 points in order to demonstrate significant improvement in lower extremity function.  Baseline: 8 Goal status: IN PROGRESS  4.  Pt will demonstrate increase in LE strength to 4 to facilitate ease and safety in ambulation  Baseline: 3+ Goal status: IN PROGRESS  5.  Pt will improve DGI by at least 3 points in order to demonstrate clinically significant improvement in balance and decreased risk for falls  Baseline: 10 Goal status:IN PROGRESS  ASSESSMENT:  CLINICAL IMPRESSION: Continued targetted interventions aimed at impairments, deficit seen in objective tests and measures. Pt tolerates session generally well, effort maintained as moderate. Rest breaks provided as needed. Pt remains very motivated to improve his safety and restore his function.     EVAL: Patient is a 47 y.o. male who was seen today for physical therapy evaluation and treatment for Chronic Inflammatory Demyelinating Polyneuritis (CIDP). Patient was diagnosed with CIDP by referring provider further defined by difficulty with ambulation and negotiating stairs due to pain, weakness, impaired balance/proprioception and decreased soft tissue extensibility. Skilled PT is required to address the impairments and functional limitations listed below. Due to weakness in grip, patient may also need OT to  address patient's issues with ADLs.Interventions today were geared towards gait training for safety. Tolerated all activities with improved confidence with safety  when ambulating. May use a quad cane as needed. Demonstrated mild levels of fatigue. Rest periods provided. Provided slight amount of cueing to ensure correct execution of activity with good carry-over.      OBJECTIVE IMPAIRMENTS: Abnormal gait, decreased activity tolerance, decreased balance, decreased endurance, decreased mobility, difficulty walking, decreased strength, impaired flexibility, impaired sensation, and pain.   ACTIVITY LIMITATIONS: carrying, lifting, bending, sitting, standing, squatting, stairs, transfers, bed mobility, continence, bathing, toileting, dressing, self feeding, reach over head, hygiene/grooming, locomotion level, and caring for others  PARTICIPATION LIMITATIONS: meal prep, cleaning, laundry, driving, shopping, community activity, occupation, and yard work  PERSONAL FACTORS: Time since onset of injury/illness/exacerbation and nature of the condition  are also affecting patient's functional outcome.   REHAB POTENTIAL: Fair    CLINICAL DECISION MAKING: Evolving/moderate complexity  EVALUATION COMPLEXITY: Moderate  PLAN:  PT FREQUENCY: 2x/week  PT DURATION: 8 weeks  PLANNED INTERVENTIONS: Therapeutic exercises, Therapeutic activity, Neuromuscular re-education, Balance training, Gait training, Patient/Family education, Self Care, Stair training, Orthotic/Fit training, Electrical stimulation, Manual therapy, and Re-evaluation  PLAN FOR NEXT SESSION: Continue POC and may progress as tolerated with emphasis on LE flexibility, functional strengthening, balance and gait activities.   Rosamaria Lints, PT Physical Therapist [name] [place]  [phone]

## 2023-05-30 ENCOUNTER — Encounter (HOSPITAL_COMMUNITY): Payer: 59

## 2023-05-30 ENCOUNTER — Telehealth (HOSPITAL_COMMUNITY): Payer: Self-pay

## 2023-05-30 NOTE — Telephone Encounter (Signed)
Called patient today for his 3rd no show. Patient did not answer and PT was unable to leave a message because the mailbox is full.   Tish Frederickson. Joselyne Spake, PT, DPT, OCS Board-Certified Clinical Specialist in Orthopedic PT PT Compact Privilege # ([place]): [phone]

## 2023-06-04 ENCOUNTER — Ambulatory Visit (HOSPITAL_COMMUNITY): Payer: 59

## 2023-06-04 DIAGNOSIS — R29898 Other symptoms and signs involving the musculoskeletal system: Secondary | ICD-10-CM

## 2023-06-04 DIAGNOSIS — R262 Difficulty in walking, not elsewhere classified: Secondary | ICD-10-CM

## 2023-06-04 DIAGNOSIS — M79604 Pain in right leg: Secondary | ICD-10-CM

## 2023-06-04 NOTE — Therapy (Signed)
OUTPATIENT PHYSICAL THERAPY NOTE   Patient Name: Connor Copeland MRN: [id number] DOB:Jun 09, 1976, 47 y.o., male Today's Date: 06/04/2023  PCP: none on file REFERRING PROVIDER: Benita Stabile, MD  END OF SESSION:  PT End of Session - 06/04/23 0807     Visit Number 10    Number of Visits 24    Date for PT Re-Evaluation 07/10/23    Authorization Type [employer] (no auth, no limit, no copay)    Progress Note Due on Visit 16    PT Start Time 0800    PT Stop Time 0840    PT Time Calculation (min) 40 min    Equipment Utilized During Treatment Gait belt    Activity Tolerance Patient limited by fatigue;Patient tolerated treatment well;Patient limited by pain    Behavior During Therapy WFL for tasks assessed/performed            Past Medical History:  Diagnosis Date   ETOH abuse    Gout    Past Surgical History:  Procedure Laterality Date   FOOT SURGERY     Patient Active Problem List   Diagnosis Date Noted   Seizure disorder (HCC) 03/19/2023   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   Neuropathic pain 03/19/2023   Delirium tremens (HCC) 01/29/2023   Alcohol withdrawal seizure (HCC) 01/28/2023   Alcohol abuse 01/28/2023   Tobacco use disorder 01/28/2023   Hyponatremia 01/28/2023   Elevated MCV 01/28/2023   Macrocytic anemia 01/28/2023   Elevated transaminase level 01/28/2023   Hyperbilirubinemia 01/28/2023   Gout 01/28/2023   HLD (hyperlipidemia) 12/11/2022   Hypertriglyceridemia 12/11/2022   Family history of coronary artery disease 12/11/2022   Peripheral neuropathy 12/11/2022   Leg swelling 12/11/2022   Hypoalbuminemia 12/11/2022    ONSET DATE: March 2024  REFERRING DIAG: G61.81 (ICD-10-CM) - Chronic inflammatory demyelinating polyneuritis  THERAPY DIAG:  Difficulty in walking, not elsewhere classified  Weakness of both lower extremities  Pain in both lower extremities  Rationale for Evaluation and  Treatment: Rehabilitation  SUBJECTIVE:                                                                                                                                                                                             SUBJECTIVE STATEMENT: Pt states that he's feeling tired today (RPE = 8/10) and the legs are painful (9/10 pain). Patient was unable to attend to his last session because he was not feeling well. Patient states that he's been doing his HEP.  PROGRESS NOTE 05/21/23: Patient had an episode of a mechanical fall 4 days ago but was able to catch himself.  Patient still reports of pain = 9/10 on B LE. Patient states that he's a little bit more energetic today and has been having his protein shake which he thinks has helped a lot with his energy. Patient states that he's 10% better. Patient states that he has been doing his HEP   EVAL: Arrives to the clinic with c/o difficulty with walking, weakness on B LE (R>L) and some unsteadiness especially when standing with his eyes closed. Uses a rolling walker at times. Patient also reports that he gets tired easily and feels numb on B UE/LE. Patient also states that his grip is weak. Condition started March 2024 when patient had pains on the hip without apparent reason  which gradually got worse until the pain extended to the feet and hands. Condition got worse in May 2024 where pain was so severe that patient cannot work anymore. Patient also had a seizure in June 2024 and was admitted to the hospital where patient had PT. Patient was D/C and had PT at home for 2 months. In July 2024, MD told him that he may have CIDP. Patient was given Prednisone 80 mg just recently and will be tapered down weekly. Patient was then referred to outpatient PT evaluation and management.  Pt accompanied by: self  PERTINENT HISTORY: PMH: heavy ETOH use, peripheral neuropathy, gout, tobacco use, HLD, alcoholic thrombocytopenia. seizure disorder, gout  PAIN:  Are  you having pain? 8.5/10 no major changes but improved since falls.    PRECAUTIONS: Fall   WEIGHT BEARING RESTRICTIONS: No  FALLS: Has patient fallen in last 6 months? Yes. Number of falls 5 (last was in October 2024)  LIVING ENVIRONMENT: Lives with:  lives with mother Lives in: House/apartment Stairs: Yes: Internal: 14 steps; on right going up and External: 4 steps; bilateral but cannot reach both Has following equipment at home: Dan Humphreys - 2 wheeled  PLOF: Independent, Independent with basic ADLs, and used to be a shift leader at [employer]  PATIENT GOALS: "try to get my balance so I go back to work"  OBJECTIVE:   TODAY'S TREATMENT:                                                                                                                              DATE:  06/04/23 Recumbent stepper, arm 8, seat 13, level 3, > 70 SPM x 5'  Seated hamstring stretch x 30" x 3 Gastrocsoleus slant board stretch x 30" x 3 Tandem stance, firm surface, trunk rotation with yellow med ball x 10 on each Rebounding ball on trampoline, firm surface, feet together x 30" Chop/lift, yellow med ball, foam, normal, BOS x 10 Resisted walking backwards/forwards with bodycraft, 3 plates x 2 rounds Bodycraft TKE, 3 plates x 3" x 10  05/28/23: -Nustep AA/ROM, cardio conditioning, seat 13, arms 10, level 3 x 5 minutes (level increased today)  *already stretched calves prior to coming in so this is eschewed   -  hands free STS from elevated plinth 1x10 -stabilization marching on dynadisc 1x20 reciprocal pattern  -hands free STS from elevated plinth 1x10 -stabilization marching on dynadisc 1x20 reciprocal pattern   -quite sitting, proprioceptive training on dynadisc, eyes closed 1x30 (too long), then 5x15sec  -4-square stepping pattern for balance training: 50ft fwd, 21ft Right, 4 feet retro (counter support available prn) 73ft left; (5 rounds then a break due to rubbery legs)  Backward walking remains  most provoking to imbalance  *rest break  -4-square stepping pattern for balance training: 60ft fwd, 3ft Right, 4 feet retro (counter support available prn) 57ft left; (5 rounds then a break due to rubbery legs)  *rest break   -airex stance rebounding off wall over head x10, off floor x10, then alteranting floor/wall x6  05/21/23 Progress note (5TSTS, , DGI, MMT, LEFS) Recumbent stepper, arm 8, seat 13, level 3, > 60 SPM x 5'  Gastrocsoleus slant board stretch x 30" x 3  05/16/23 Recumbent stepper, arm 8, seat 13, level 2, > 70 SPM x 5'  Gastrocsoleus slant board stretch x 30" x 3 Standing:  Rocking back and forth with arm raise, one LE forward x 10 x 2 on each  Chop/lift, yellow med ball, foam, normal, BOS x 10  Tandem stance, firm surface, with push/pull with a dowel x 3" x 10 on each  Head nods/turns, foam, eyes open x 10  Bodycraft standing TKE x 3" x 10 x 2 plates each  57/8/46 Recumbent stepper, arm 8, seat 13, level 2, > 70 SPM x 5'  Gastrocsoleus slant board stretch x 30" x 3 Standing:  Rocking back and forth with arm raise, one LE forward x 10 x 2 on each  Chop/lift, yellow med ball, foam, normal, BOS x 10  Tandem stance, firm surface, with push/pull with a dowel x 3" x 10 on each  Bodycraft standing TKE x 3" x 10 x 1 plate each  96/2/95 Education on sensory re-education techniques for 5 minutes beginning with softer textures like a satin cloth then progressed to rougher texture like a towel (gave excellent verbal understanding) Recumbent stepper, arm 8, seat 13, level 2, > 70 SPM x 5'  Gastrocsoleus slant board stretch x 30" x 3 Standing:  Heel raises x 10 x 2 x 2 lbs  Toe raises x 10 x 2 x 2 lbs  High marches x 10 x 2 x 2 lbs  Rocking back and forth with arm raise, one LE forward x 10 x 2 on each  Chop/lift, yellow med ball, foam, normal, BOS x 10  Tandem stance, firm surface, with push/pull with a dowel x 3" x 10 x 2 on each  Resisted walking backwards/forwards  with bodycraft, 2 plates x 2 rounds  05/02/23 Recumbent stepper, arm 8, seat 13, level 3, > 60 SPM x 5'  Gastrocsoleus slant board stretch x 30" x 3 Standing:  Mini squats, yellow med ball x 3" x 10 x 2  Heel raises x 10 x 2  Toe raises x 10 x 2  High marches x 10 x 2  Rhythmic stabilization, foam, normal BOS, eyes open x ant/post x 3" x 10 x 2  Chop/lift, yellow med ball, foam, normal, BOS x 10  Tandem stance, firm surface, eyes open x 30" x 2  TKE, Bodycraft, 1 plate x 10 x 2 x 3" each  Resisted walking backwards with bodycraft, 2 plates x 3 rounds  04/25/23 Recumbent stepper, arm 8, seat 13, level  1, > 60 SPM x 5'  Gastrocsoleus slant board stretch x 30" x 3 Standing:  Rhythmic stabilization, firm surface, normal BOS, eyes open x ant/post x 3" x 10 x 2  Yellow med ball raises, firm surface, normal, BOS, eyes open, x 10  Tandem stance, firm surface, eyes open x 30" x 2  TKE, RTB x 10 x 2 x 3"  Resisted walking backwards with bodycraft, 2 plates x 5 rounds  04/24/23 Recumbent stepper, arm 8, seat 13, level 1, > 60 SPM x 5' Seated Gastrocsoleus stretch with a strap x 30" x 3 Standing:  Mini squats x 3" x 10 x 2  Hip vectors, RTB x 10 x 2  Heel/toe raises x 10 x 2  TKE, RTB x 10 x 2 x 3"  04/17/23 Evaluation and patient education done Gait training with a quad cane with CGA for around 80 ft    PATIENT EDUCATION: Education details: Updated written HEP Person educated: Patient Education method: Explanation Education comprehension: verbalized understanding  HOME EXERCISE PROGRAM: Access Code: [email or web address] URL: [email or web address] 06/04/23 - Seated Hamstring Stretch  - 1-2 x daily - 7 x weekly - 3 reps - 30 hold  05/02/2023 - Standing March with Counter Support  - 1-2 x daily - 7 x weekly - 2 sets - 10 reps  04/25/2023 - Tandem Stance with Support  - 1-2 x daily - 7 x weekly - 2 reps - 30 hold  Date: 04/24/2023 Prepared by: Krystal Clark  Exercises -  Seated Calf Stretch with Strap  - 1-2 x daily - 7 x weekly - 3 reps - 30 hold - Mini Squat with Counter Support  - 1-2 x daily - 7 x weekly - 2 sets - 10 reps - 3 hold - Standing 3-Way Leg Reach with Resistance at Ankles and Counter Support  - 1-2 x daily - 7 x weekly - 2 sets - 10 reps - Heel Toe Raises with Counter Support  - 1-2 x daily - 7 x weekly - 2 sets - 10 reps  GOALS: Goals reviewed with patient? Yes  SHORT TERM GOALS: Target date: 06/11/23  Pt will demonstrate indep in HEP to facilitate carry-over of skilled services and improve functional outcomes Goal status: MET  2.  Pt will decrease 5TSTS by at least 3 seconds in order to demonstrate clinically significant improvement in LE strength   Baseline: 33.77 sec Goal status: IN PROGRESS  3.  Pt will increase by at least 40 ft in order to demonstrate clinically significant improvement in community ambulation Baseline: 277 ft Goal status: IN PROGRESS  4.  Pt will increase LEFS by at least 9 points in order to demonstrate significant improvement in lower extremity function.  Baseline: 8 Goal status: IN PROGRESS  LONG TERM GOALS: Target date: 07/10/23  Pt will decrease 5TSTS by at least 6 seconds in order to demonstrate clinically significant improvement in LE strength   Baseline: 33.77 sec Goal status: IN PROGRESS  2.  Pt will increase by at least 80 ft in order to demonstrate clinically significant improvement in community ambulation Baseline: 277 ft Goal status: IN PROGRESS  3.  Pt will increase LEFS by at least 18 points in order to demonstrate significant improvement in lower extremity function.  Baseline: 8 Goal status: IN PROGRESS  4.  Pt will demonstrate increase in LE strength to 4 to facilitate ease and safety in ambulation  Baseline: 3+ Goal status: IN PROGRESS  5.  Pt  will improve DGI by at least 3 points in order to demonstrate clinically significant improvement in balance and decreased risk for  falls  Baseline: 10 Goal status:IN PROGRESS  ASSESSMENT:  CLINICAL IMPRESSION: Interventions today were geared towards balance, LE strengthening and flexibility. Tolerated all activities with increased fatigue. Patient still demonstrated mod to severe levels of fatigue (RPE = 9/10). Pacing of activities was still slow. Frequent and prolonged rest periods given. Demonstrated mild unsteadiness seen with trunk rotation in tandem stance and chop/lift due to impaired proprioception. Provided slight amount of cueing to ensure correct execution of activity with good carry-over. To date, skilled PT is required to address the impairments and improve function. Patient may need to speak with referring provider for the loss of appetite   PROGRESS NOTE 05/21/23: Patient demonstrated continued improvements in function as indicated by positive significant changes in activity tolerance as indicated by improvement in . However, patient still presents with deficits in balance, gait, and strength as indicated by DGI. With this, skilled PT is still required to address the impairments and functional limitations listed below. Interventions today were geared towards LE strengthening and flexibility. Tolerated all activities with mild fatigue. Pacing of activities was still slow. Rest periods provided. Provided slight amount of cueing to ensure correct execution of activity with good carry-over. To date, skilled PT is required to address the impairments and improve function.     EVAL: Patient is a 47 y.o. male who was seen today for physical therapy evaluation and treatment for Chronic Inflammatory Demyelinating Polyneuritis (CIDP). Patient was diagnosed with CIDP by referring provider further defined by difficulty with ambulation and negotiating stairs due to pain, weakness, impaired balance/proprioception and decreased soft tissue extensibility. Skilled PT is required to address the impairments and functional limitations  listed below. Due to weakness in grip, patient may also need OT to address patient's issues with ADLs.Interventions today were geared towards gait training for safety. Tolerated all activities with improved confidence with safety when ambulating. May use a quad cane as needed. Demonstrated mild levels of fatigue. Rest periods provided. Provided slight amount of cueing to ensure correct execution of activity with good carry-over.      OBJECTIVE IMPAIRMENTS: Abnormal gait, decreased activity tolerance, decreased balance, decreased endurance, decreased mobility, difficulty walking, decreased strength, impaired flexibility, impaired sensation, and pain.   ACTIVITY LIMITATIONS: carrying, lifting, bending, sitting, standing, squatting, stairs, transfers, bed mobility, continence, bathing, toileting, dressing, self feeding, reach over head, hygiene/grooming, locomotion level, and caring for others  PARTICIPATION LIMITATIONS: meal prep, cleaning, laundry, driving, shopping, community activity, occupation, and yard work  PERSONAL FACTORS: Time since onset of injury/illness/exacerbation and nature of the condition  are also affecting patient's functional outcome.   REHAB POTENTIAL: Fair    CLINICAL DECISION MAKING: Evolving/moderate complexity  EVALUATION COMPLEXITY: Moderate  PLAN:  PT FREQUENCY: 2x/week  PT DURATION: 8 weeks  PLANNED INTERVENTIONS: Therapeutic exercises, Therapeutic activity, Neuromuscular re-education, Balance training, Gait training, Patient/Family education, Self Care, Stair training, Orthotic/Fit training, Electrical stimulation, Manual therapy, and Re-evaluation  PLAN FOR NEXT SESSION: Continue POC and may progress as tolerated with emphasis on LE flexibility, functional strengthening, balance and gait activities.   Tish Frederickson. Miles Borkowski, PT, DPT, OCS Board-Certified Clinical Specialist in Orthopedic PT PT Compact Privilege # ([place]): [phone] [facility]  [place]  [phone]

## 2023-06-06 ENCOUNTER — Encounter (HOSPITAL_COMMUNITY): Payer: 59

## 2023-06-11 ENCOUNTER — Telehealth (HOSPITAL_COMMUNITY): Payer: Self-pay

## 2023-06-11 ENCOUNTER — Encounter (HOSPITAL_COMMUNITY): Payer: 59

## 2023-06-11 NOTE — Telephone Encounter (Signed)
Patient called clinic today saying that he won't be able to come to his appointment today due to being sick.  Tish Frederickson. Jesselyn Rask, PT, DPT, OCS Board-Certified Clinical Specialist in Orthopedic PT PT Compact Privilege # ([place]): [phone] T

## 2023-06-13 ENCOUNTER — Encounter: Payer: Self-pay | Admitting: Internal Medicine

## 2023-06-13 ENCOUNTER — Encounter (HOSPITAL_COMMUNITY): Payer: 59

## 2023-06-13 ENCOUNTER — Ambulatory Visit: Payer: 59 | Attending: Internal Medicine | Admitting: Internal Medicine

## 2023-06-13 ENCOUNTER — Telehealth (HOSPITAL_COMMUNITY): Payer: Self-pay

## 2023-06-13 NOTE — Progress Notes (Signed)
Erroneous encounter - please disregard.

## 2023-06-13 NOTE — Telephone Encounter (Signed)
Patient called today at around 8:04 am and was able to speak with Elmarie Shiley saying that he won't be able to make it to his appointment today because he's sick.  Tish Frederickson. Kaysea Raya, PT, DPT, OCS Board-Certified Clinical Specialist in Orthopedic PT PT Compact Privilege # (): [phone] T

## 2023-06-18 ENCOUNTER — Encounter (HOSPITAL_COMMUNITY): Payer: 59

## 2023-06-18 ENCOUNTER — Telehealth (HOSPITAL_COMMUNITY): Payer: Self-pay

## 2023-06-18 NOTE — Telephone Encounter (Signed)
Patient called before his 8am appointment today saying that he wants to cancel his appointment because he is sick.  Tish Frederickson. Shoni Quijas, PT, DPT, OCS Board-Certified Clinical Specialist in Orthopedic PT PT Compact Privilege # ([place]): [phone] T

## 2023-06-20 ENCOUNTER — Encounter (HOSPITAL_COMMUNITY): Payer: 59

## 2023-06-25 ENCOUNTER — Ambulatory Visit (HOSPITAL_COMMUNITY): Payer: 59 | Attending: Dermatology

## 2023-06-25 DIAGNOSIS — M79604 Pain in right leg: Secondary | ICD-10-CM | POA: Diagnosis present

## 2023-06-25 DIAGNOSIS — R29898 Other symptoms and signs involving the musculoskeletal system: Secondary | ICD-10-CM

## 2023-06-25 DIAGNOSIS — M79605 Pain in left leg: Secondary | ICD-10-CM | POA: Diagnosis present

## 2023-06-25 DIAGNOSIS — R262 Difficulty in walking, not elsewhere classified: Secondary | ICD-10-CM | POA: Diagnosis present

## 2023-06-25 NOTE — Therapy (Signed)
OUTPATIENT PHYSICAL THERAPY NOTE   Patient Name: Connor Copeland MRN: [id number] DOB:[birth year], 47 y.o., male Today's Date: 06/25/2023  PCP: none on file REFERRING PROVIDER: Benita Stabile, MD  END OF SESSION:  PT End of Session - 06/25/23 1111     Visit Number 11    Number of Visits 24    Date for PT Re-Evaluation 07/10/23    Authorization Type [employer] (no auth, no limit, no copay)    Progress Note Due on Visit 16    PT Start Time 1105    PT Stop Time 1145    PT Time Calculation (min) 40 min    Equipment Utilized During Treatment Gait belt    Activity Tolerance Patient limited by fatigue;Patient limited by pain    Behavior During Therapy WFL for tasks assessed/performed             Past Medical History:  Diagnosis Date   ETOH abuse    Gout    Past Surgical History:  Procedure Laterality Date   FOOT SURGERY     Patient Active Problem List   Diagnosis Date Noted   ERRONEOUS ENCOUNTER--DISREGARD 06/13/2023   Seizure disorder (HCC) 03/19/2023   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   Neuropathic pain 03/19/2023   Delirium tremens (HCC) 01/29/2023   Alcohol withdrawal seizure (HCC) 01/28/2023   Alcohol abuse 01/28/2023   Tobacco use disorder 01/28/2023   Hyponatremia 01/28/2023   Elevated MCV 01/28/2023   Macrocytic anemia 01/28/2023   Elevated transaminase level 01/28/2023   Hyperbilirubinemia 01/28/2023   Gout 01/28/2023   HLD (hyperlipidemia) 12/11/2022   Hypertriglyceridemia 12/11/2022   Family history of coronary artery disease 12/11/2022   Peripheral neuropathy 12/11/2022   Leg swelling 12/11/2022   Hypoalbuminemia 12/11/2022    ONSET DATE: March 2024  REFERRING DIAG: 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 Patient's balance is worse at the moment but has no recent episode of falls. Patient was not able to come to PT for almost 2 weeks because he was sick.  PROGRESS NOTE 05/21/23: Patient had an episode of  a mechanical fall 4 days ago but was able to catch himself. Patient still reports of pain = 9/10 on B LE. Patient states that he's a little bit more energetic today and has been having his protein shake which he thinks has helped a lot with his energy. Patient states that he's 10% better. Patient states that he has been doing his HEP   EVAL: Arrives to the clinic with c/o difficulty with walking, weakness on B LE (R>L) and some unsteadiness especially when standing with his eyes closed. Uses a rolling walker at times. Patient also reports that he gets tired easily and feels numb on B UE/LE. Patient also states that his grip is weak. Condition started March 2024 when patient had pains on the hip without apparent reason  which gradually got worse until the pain extended to the feet and hands. Condition got worse in May 2024 where pain was so severe that patient cannot work anymore. Patient also had a seizure in June 2024 and was admitted to the hospital where patient had PT. Patient was D/C and had PT at home for 2 months. In July 2024, MD told him that he may have CIDP. Patient was given Prednisone 80 mg just recently and will be tapered down weekly. Patient was then referred to outpatient PT evaluation and management.  Pt accompanied by: self  PERTINENT HISTORY: PMH: heavy ETOH use, peripheral neuropathy, gout, tobacco use, HLD, alcoholic  thrombocytopenia. seizure disorder, gout  PAIN:  Are you having pain? 8.5/10 no major changes but improved since falls.    PRECAUTIONS: Fall   WEIGHT BEARING RESTRICTIONS: No  FALLS: Has patient fallen in last 6 months? Yes. Number of falls 5 (last was in October 2024)  LIVING ENVIRONMENT: Lives with:  lives with mother Lives in: House/apartment Stairs: Yes: Internal: 14 steps; on right going up and External: 4 steps; bilateral but cannot reach both Has following equipment at home: Dan Humphreys - 2 wheeled  PLOF: Independent, Independent with basic ADLs, and used to be a shift leader at [employer]  PATIENT GOALS: "try to get my balance so I go back to work"  OBJECTIVE: All findings are from the initial evaluation unless otherwise dated   DIAGNOSTIC FINDINGS:  NEUROIMAGING REPORT   STUDY DATE: 02/05/2023 ... Impression This MRI of the cervical spine without contrast shows the  following:  The spinal cord appears normal.   There is no critical spinal stenosis.  At C6-C7 there are degenerative changes combining to cause mild spinal  stenosis and left greater than right foraminal narrowing.  Although there  is no definite nerve root compression the degenerative changes encroach  upon the C7 nerve roots.  Milder degenerative changes at the other cervical levels as detailed above  do not lead to spinal stenosis or nerve root compression. ...   NEUROIMAGING REPORT   STUDY DATE: 02/05/2023 ... Impression This MRI of the lumbar spine without contrast shows the  following:  The terminal spinal cord and the conus medullary medullaris appear normal.  Degenerative changes at L3-L4, L4-L5 and L5-S1 are noted but do not lead  to spinal stenosis or nerve root compression.   COGNITION: Overall cognitive status: Within functional limits for tasks assessed             SENSATION: Light touch: Impaired  on B LE   MUSCLE LENGTH: Hamstrings: mild restriction on B Gastrocnemius: mild  restriction on B   POSTURE: rounded shoulders, forward head, decreased lumbar lordosis, and knees  slightly flexed (standing)   LOWER EXTREMITY ROM:      Active  Right Eval Left Eval  Hip flexion [facility]  Hip extension      Hip abduction [facility]  Hip adduction [facility]  Hip internal rotation      Hip external rotation      Knee flexion [facility]  Knee extension [facility]  Ankle dorsiflexion [facility]  Ankle plantarflexion [facility] WFL  Ankle inversion      Ankle eversion       (Blank rows = not tested)   LOWER EXTREMITY MMT:     MMT Right Eval Left Eval Right 05/21/23 Left 05/21/23  Hip flexion 3+ 3+ 4- 3+  Hip extension          Hip abduction 4- 3+ 4-4- 3+  Hip adduction 4- 4-   4-  Hip internal rotation          Hip external rotation          Knee flexion 3+ 3+ 4- 3+  Knee extension 3+ 3+ 4- 3+  Ankle dorsiflexion 3+ 3+ 4- 4-  Ankle plantarflexion 3+ 3+ 4- 3+  Ankle inversion          Ankle eversion          (Blank rows = not tested)   TRANSFERS: Assistive device utilized: None  Sit to stand: SBA Stand to sit: SBA   STAIRS: 05/21/23 Level of Assistance: SBA Stair Negotiation Technique: Alternating Pattern  with Single Rail on Right Number of Stairs: 4  Height of Stairs: 7"   Comments: Wide BOS, slightly staggering, knees slightly flexed, slightly unsteady   GAIT: Gait pattern: decreased arm swing- Right, decreased arm swing- Left, decreased step length- Right, decreased step length- Left, decreased stride length, decreased ankle dorsiflexion- Right, decreased ankle dorsiflexion- Left, knee flexed in stance- Right, knee flexed in stance- Left, decreased trunk rotation, trunk flexed, wide BOS, poor foot clearance- Right, and poor foot clearance- Left Distance walked: 140 ft Assistive device utilized: None Level of assistance: CGA Comments: mildly unsteady   FUNCTIONAL TESTS:  5 times sit to stand: 05/21/23: 33.77 sec from 34.43 sec 2 minute walk test: 05/21/23: 227  ft from 140 ft Dynamic Gait Index: 05/21/23: 10 from 8   DGI 05/21/23 1. Gait level surface (1) Moderate Impairment: Walks 20', slow speed, abnormal gait pattern, evidence for imbalance. 2. Change in gait speed (1) Moderate Impairment: Makes only minor adjustments to walking speed, or accomplishes a change in speed with significant gait deviations, or changes speed but has significant gait deviations, or changes speed but loses balance but is able to recover and continue walking. 3. Gait with horizontal head turns (1) Moderate Impairment: Performs head turns with moderate change in gait velocity, slows down, staggers but recovers, can continue to walk. 4. Gait with vertical head turns 1) Moderate Impairment: Performs head turns with moderate change in gait velocity, slows down, staggers but recovers, can continue to walk. 5. Gait and pivot turn (1) Moderate Impairment: Turns slowly, requires verbal cueing, requires several small steps to catch balance following turn and stop. 6. Step over obstacle (1) Moderate Impairment: Is able to step over box but must stop, then step over. May require verbal cueing. 7. Step around obstacles (2) Mild Impairment: Is able to step around both cones, but must slow down and adjust steps to clear cones. 8. Stairs (2) Mild Impairment: Alternating feet, must use rail.   TOTAL SCORE: 10 /  24   DGI (evaluation) 1. Gait level surface (1) Moderate Impairment: Walks 20', slow speed, abnormal gait pattern, evidence for imbalance. 2. Change in gait speed (1) Moderate Impairment: Makes only minor adjustments to walking speed, or accomplishes a change in speed with significant gait deviations, or changes speed but has significant gait deviations, or changes speed but loses balance but is able to recover and continue walking. 3. Gait with horizontal head turns (1) Moderate Impairment: Performs head turns with moderate change in gait velocity, slows down, staggers but  recovers, can continue to walk. 4. Gait with vertical head turns 1) Moderate Impairment: Performs head turns with moderate change in gait velocity, slows down, staggers but recovers, can continue to walk. 5. Gait and pivot turn (1) Moderate Impairment: Turns slowly, requires verbal cueing, requires several small steps to catch balance following turn and stop. 6. Step over obstacle (1) Moderate Impairment: Is able to step over box but must stop, then step over. May require verbal cueing. 7. Step around obstacles (1) Moderate Impairment: Is able to clear cones but must significantly slow, speed to accomplish task, or requires verbal cueing. 8. Stairs (1) Moderate Impairment: Two feet to a stair, must use rail.   TOTAL SCORE: 8 / 24   PATIENT SURVEYS:  LEFS 05/21/23 8/80 = 10% from 6/80 = 7.5%  TODAY'S TREATMENT:                                                                                                                              DATE:  06/25/23 Seated hamstring stretch x 30" x 3 Gastrocsoleus slant board stretch x 30" x 3 Chop/lift, yellow med ball, foam, normal, BOS x 10 Tandem stance, firm surface, trunk rotation with yellow med ball x 10 on each Walking backwards, 20 ft x 3 rounds Walking sideways, 20 ft x 3 rounds Bodycraft TKE, 3 plates x 3" x 10 Recumbent stepper, arm 8, seat 13, level 3, > 70 SPM x 5'   06/04/23 Recumbent stepper, arm 8, seat 13, level 3, > 70 SPM x 5'  Seated hamstring stretch x 30" x 3 Gastrocsoleus slant board stretch x 30" x 3 Tandem stance, firm surface, trunk rotation with yellow med ball x 10 on each Rebounding ball on trampoline, firm surface, feet together x 30" Chop/lift, yellow med ball, foam, normal, BOS x 10 Resisted walking backwards/forwards with bodycraft, 3 plates x 2 rounds Bodycraft TKE, 3 plates x 3" x 10  05/28/23: -Nustep AA/ROM, cardio conditioning, seat 13, arms 10, level 3 x 5 minutes (level increased today)  *already  stretched calves prior to coming in so this is eschewed   -hands free STS from elevated plinth 1x10 -stabilization marching on dynadisc 1x20 reciprocal pattern  -hands free STS from elevated plinth 1x10 -stabilization marching on dynadisc 1x20 reciprocal pattern   -quite sitting, proprioceptive training on dynadisc, eyes closed 1x30 (too long), then 5x15sec  -4-square stepping pattern for balance training:  4ft fwd, 40ft Right, 4 feet retro (counter support available prn) 37ft left; (5 rounds then a break due to rubbery legs)  Backward walking remains most provoking to imbalance  *rest break  -4-square stepping pattern for balance training: 20ft fwd, 49ft Right, 4 feet retro (counter support available prn) 68ft left; (5 rounds then a break due to rubbery legs)  *rest break   -airex stance rebounding off wall over head x10, off floor x10, then alteranting floor/wall x6  05/21/23 Progress note (5TSTS, , DGI, MMT, LEFS) Recumbent stepper, arm 8, seat 13, level 3, > 60 SPM x 5'  Gastrocsoleus slant board stretch x 30" x 3  05/16/23 Recumbent stepper, arm 8, seat 13, level 2, > 70 SPM x 5'  Gastrocsoleus slant board stretch x 30" x 3 Standing:  Rocking back and forth with arm raise, one LE forward x 10 x 2 on each  Chop/lift, yellow med ball, foam, normal, BOS x 10  Tandem stance, firm surface, with push/pull with a dowel x 3" x 10 on each  Head nods/turns, foam, eyes open x 10  Bodycraft standing TKE x 3" x 10 x 2 plates each  40/9/81 Recumbent stepper, arm 8, seat 13, level 2, > 70 SPM x 5'  Gastrocsoleus slant board stretch x 30" x 3 Standing:  Rocking back and forth with arm raise, one LE forward x 10 x 2 on each  Chop/lift, yellow med ball, foam, normal, BOS x 10  Tandem stance, firm surface, with push/pull with a dowel x 3" x 10 on each  Bodycraft standing TKE x 3" x 10 x 1 plate each  19/1/47 Education on sensory re-education techniques for 5 minutes beginning with softer  textures like a satin cloth then progressed to rougher texture like a towel (gave excellent verbal understanding) Recumbent stepper, arm 8, seat 13, level 2, > 70 SPM x 5'  Gastrocsoleus slant board stretch x 30" x 3 Standing:  Heel raises x 10 x 2 x 2 lbs  Toe raises x 10 x 2 x 2 lbs  High marches x 10 x 2 x 2 lbs  Rocking back and forth with arm raise, one LE forward x 10 x 2 on each  Chop/lift, yellow med ball, foam, normal, BOS x 10  Tandem stance, firm surface, with push/pull with a dowel x 3" x 10 x 2 on each  Resisted walking backwards/forwards with bodycraft, 2 plates x 2 rounds  05/02/23 Recumbent stepper, arm 8, seat 13, level 3, > 60 SPM x 5'  Gastrocsoleus slant board stretch x 30" x 3 Standing:  Mini squats, yellow med ball x 3" x 10 x 2  Heel raises x 10 x 2  Toe raises x 10 x 2  High marches x 10 x 2  Rhythmic stabilization, foam, normal BOS, eyes open x ant/post x 3" x 10 x 2  Chop/lift, yellow med ball, foam, normal, BOS x 10  Tandem stance, firm surface, eyes open x 30" x 2  TKE, Bodycraft, 1 plate x 10 x 2 x 3" each  Resisted walking backwards with bodycraft, 2 plates x 3 rounds  04/25/23 Recumbent stepper, arm 8, seat 13, level 1, > 60 SPM x 5'  Gastrocsoleus slant board stretch x 30" x 3 Standing:  Rhythmic stabilization, firm surface, normal BOS, eyes open x ant/post x 3" x 10 x 2  Yellow med ball raises, firm surface, normal, BOS, eyes open, x 10  Tandem stance, firm surface, eyes  open x 30" x 2  TKE, RTB x 10 x 2 x 3"  Resisted walking backwards with bodycraft, 2 plates x 5 rounds  04/24/23 Recumbent stepper, arm 8, seat 13, level 1, > 60 SPM x 5' Seated Gastrocsoleus stretch with a strap x 30" x 3 Standing:  Mini squats x 3" x 10 x 2  Hip vectors, RTB x 10 x 2  Heel/toe raises x 10 x 2  TKE, RTB x 10 x 2 x 3"  04/17/23 Evaluation and patient education done Gait training with a quad cane with CGA for around 80 ft    PATIENT EDUCATION: Education  details: Updated written HEP Person educated: Patient Education method: Explanation Education comprehension: verbalized understanding  HOME EXERCISE PROGRAM: Access Code: [email or web address] URL: [email or web address] 06/04/23 - Seated Hamstring Stretch  - 1-2 x daily - 7 x weekly - 3 reps - 30 hold  05/02/2023 - Standing March with Counter Support  - 1-2 x daily - 7 x weekly - 2 sets - 10 reps  04/25/2023 - Tandem Stance with Support  - 1-2 x daily - 7 x weekly - 2 reps - 30 hold  Date: 04/24/2023 Prepared by: Krystal Clark  Exercises - Seated Calf Stretch with Strap  - 1-2 x daily - 7 x weekly - 3 reps - 30 hold - Mini Squat with Counter Support  - 1-2 x daily - 7 x weekly - 2 sets - 10 reps - 3 hold - Standing 3-Way Leg Reach with Resistance at Ankles and Counter Support  - 1-2 x daily - 7 x weekly - 2 sets - 10 reps - Heel Toe Raises with Counter Support  - 1-2 x daily - 7 x weekly - 2 sets - 10 reps  GOALS: Goals reviewed with patient? Yes  SHORT TERM GOALS: Target date: 06/11/23  Pt will demonstrate indep in HEP to facilitate carry-over of skilled services and improve functional outcomes Goal status: MET  2.  Pt will decrease 5TSTS by at least 3 seconds in order to demonstrate clinically significant improvement in LE strength   Baseline: 33.77 sec Goal status: IN PROGRESS  3.  Pt will increase by at least 40 ft in order to demonstrate clinically significant improvement in community ambulation Baseline: 277 ft Goal status: IN PROGRESS  4.  Pt will increase LEFS by at least 9 points in order to demonstrate significant improvement in lower extremity function.  Baseline: 8 Goal status: IN PROGRESS  LONG TERM GOALS: Target date: 07/10/23  Pt will decrease 5TSTS by at least 6 seconds in order to demonstrate clinically significant improvement in LE strength   Baseline: 33.77 sec Goal status: IN PROGRESS  2.  Pt will increase by at least 80 ft in order  to demonstrate clinically significant improvement in community ambulation Baseline: 277 ft Goal status: IN PROGRESS  3.  Pt will increase LEFS by at least 18 points in order to demonstrate significant improvement in lower extremity function.  Baseline: 8 Goal status: IN PROGRESS  4.  Pt will demonstrate increase in LE strength to 4 to facilitate ease and safety in ambulation  Baseline: 3+ Goal status: IN PROGRESS  5.  Pt will improve DGI by at least 3 points in order to demonstrate clinically significant improvement in balance and decreased risk for falls  Baseline: 10 Goal status:IN PROGRESS  ASSESSMENT:  CLINICAL IMPRESSION: Interventions today were geared towards balance, LE strengthening and flexibility. Tolerated all activities without worsening fatigue (RPE remained  8/10). Pacing of activities was still slow. Frequent and prolonged rest periods given. Still demonstrated mild unsteadiness seen with trunk rotation in tandem stance and chop/lift due to impaired proprioception. Provided slight amount of cueing to ensure correct execution of activity with good carry-over. To date, skilled PT is required to address the impairments and improve function. Patient may need to speak with referring provider for the loss of appetite   PROGRESS NOTE 05/21/23: Patient demonstrated continued improvements in function as indicated by positive significant changes in activity tolerance as indicated by improvement in . However, patient still presents with deficits in balance, gait, and strength as indicated by DGI. With this, skilled PT is still required to address the impairments and functional limitations listed below. Interventions today were geared towards LE strengthening and flexibility. Tolerated all activities with mild fatigue. Pacing of activities was still slow. Rest periods provided. Provided slight amount of cueing to ensure correct execution of activity with good carry-over. To date, skilled  PT is required to address the impairments and improve function.     EVAL: Patient is a 47 y.o. male who was seen today for physical therapy evaluation and treatment for Chronic Inflammatory Demyelinating Polyneuritis (CIDP). Patient was diagnosed with CIDP by referring provider further defined by difficulty with ambulation and negotiating stairs due to pain, weakness, impaired balance/proprioception and decreased soft tissue extensibility. Skilled PT is required to address the impairments and functional limitations listed below. Due to weakness in grip, patient may also need OT to address patient's issues with ADLs.Interventions today were geared towards gait training for safety. Tolerated all activities with improved confidence with safety when ambulating. May use a quad cane as needed. Demonstrated mild levels of fatigue. Rest periods provided. Provided slight amount of cueing to ensure correct execution of activity with good carry-over.      OBJECTIVE IMPAIRMENTS: Abnormal gait, decreased activity tolerance, decreased balance, decreased endurance, decreased mobility, difficulty walking, decreased strength, impaired flexibility, impaired sensation, and pain.   ACTIVITY LIMITATIONS: carrying, lifting, bending, sitting, standing, squatting, stairs, transfers, bed mobility, continence, bathing, toileting, dressing, self feeding, reach over head, hygiene/grooming, locomotion level, and caring for others  PARTICIPATION LIMITATIONS: meal prep, cleaning, laundry, driving, shopping, community activity, occupation, and yard work  PERSONAL FACTORS: Time since onset of injury/illness/exacerbation and nature of the condition  are also affecting patient's functional outcome.   REHAB POTENTIAL: Fair    CLINICAL DECISION MAKING: Evolving/moderate complexity  EVALUATION COMPLEXITY: Moderate  PLAN:  PT FREQUENCY: 2x/week  PT DURATION: 8 weeks  PLANNED INTERVENTIONS: Therapeutic exercises, Therapeutic  activity, Neuromuscular re-education, Balance training, Gait training, Patient/Family education, Self Care, Stair training, Orthotic/Fit training, Electrical stimulation, Manual therapy, and Re-evaluation  PLAN FOR NEXT SESSION: Continue POC and may progress as tolerated with emphasis on LE flexibility, functional strengthening, balance and gait activities.   Tish Frederickson. Nyiah Pianka, PT, DPT, OCS Board-Certified Clinical Specialist in Orthopedic PT PT Compact Privilege # ([place]): [id number] [facility]  [phone]

## 2023-06-27 ENCOUNTER — Ambulatory Visit (HOSPITAL_COMMUNITY): Payer: 59

## 2023-06-27 DIAGNOSIS — R262 Difficulty in walking, not elsewhere classified: Secondary | ICD-10-CM

## 2023-06-27 DIAGNOSIS — R29898 Other symptoms and signs involving the musculoskeletal system: Secondary | ICD-10-CM

## 2023-06-27 DIAGNOSIS — M79605 Pain in left leg: Secondary | ICD-10-CM

## 2023-06-27 NOTE — Therapy (Signed)
OUTPATIENT PHYSICAL THERAPY NOTE   Patient Name: Connor Copeland MRN: [id number] DOB:02-Jan-1976, 47 y.o., male Today's Date: 06/27/2023  PCP: none on file REFERRING PROVIDER: Benita Stabile, MD  END OF SESSION:  PT End of Session - 06/27/23 1113     Visit Number 12    Number of Visits 24    Date for PT Re-Evaluation 07/10/23    Authorization Type [employer] (no auth, no limit, no copay)    Progress Note Due on Visit 16    PT Start Time 1100    PT Stop Time 1140    PT Time Calculation (min) 40 min    Equipment Utilized During Treatment Gait belt    Activity Tolerance Patient limited by fatigue;Patient limited by pain            Past Medical History:  Diagnosis Date   ETOH abuse    Gout    Past Surgical History:  Procedure Laterality Date   FOOT SURGERY     Patient Active Problem List   Diagnosis Date Noted   ERRONEOUS ENCOUNTER--DISREGARD 06/13/2023   Seizure disorder (HCC) 03/19/2023   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   Neuropathic pain 03/19/2023   Delirium tremens (HCC) 01/29/2023   Alcohol withdrawal seizure (HCC) 01/28/2023   Alcohol abuse 01/28/2023   Tobacco use disorder 01/28/2023   Hyponatremia 01/28/2023   Elevated MCV 01/28/2023   Macrocytic anemia 01/28/2023   Elevated transaminase level 01/28/2023   Hyperbilirubinemia 01/28/2023   Gout 01/28/2023   HLD (hyperlipidemia) 12/11/2022   Hypertriglyceridemia 12/11/2022   Family history of coronary artery disease 12/11/2022   Peripheral neuropathy 12/11/2022   Leg swelling 12/11/2022   Hypoalbuminemia 12/11/2022    ONSET DATE: March 2024  REFERRING DIAG: 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 Patient  reports that he was tired from the last session. No recent episode of falls.  PROGRESS NOTE 05/21/23: Patient had an episode of a mechanical fall 4 days ago but was able to catch himself. Patient still reports of pain = 9/10 on B LE. Patient states that he's a little bit more  energetic today and has been having his protein shake which he thinks has helped a lot with his energy. Patient states that he's 10% better. Patient states that he has been doing his HEP   EVAL: Arrives to the clinic with c/o difficulty with walking, weakness on B LE (R>L) and some unsteadiness especially when standing with his eyes closed. Uses a rolling walker at times. Patient also reports that he gets tired easily and feels numb on B UE/LE. Patient also states that his grip is weak. Condition started March 2024 when patient had pains on the hip without apparent reason  which gradually got worse until the pain extended to the feet and hands. Condition got worse in May 2024 where pain was so severe that patient cannot work anymore. Patient also had a seizure in June 2024 and was admitted to the hospital where patient had PT. Patient was D/C and had PT at home for 2 months. In July 2024, MD told him that he may have CIDP. Patient was given Prednisone 80 mg just recently and will be tapered down weekly. Patient was then referred to outpatient PT evaluation and management.  Pt accompanied by: self  PERTINENT HISTORY: PMH: heavy ETOH use, peripheral neuropathy, gout, tobacco use, HLD, alcoholic thrombocytopenia. seizure disorder, gout  PAIN:  Are you having pain? 8.5/10 no major changes but improved since falls.    PRECAUTIONS: Fall   WEIGHT BEARING  RESTRICTIONS: No  FALLS: Has patient fallen in last 6 months? Yes. Number of falls 5 (last was in October 2024)  LIVING ENVIRONMENT: Lives with:  lives with mother Lives in: House/apartment Stairs: Yes: Internal: 14 steps; on right going up and External: 4 steps; bilateral but cannot reach both Has following equipment at home: Dan Humphreys - 2 wheeled  PLOF: Independent, Independent with basic ADLs, and used to be a shift leader at [employer]  PATIENT GOALS: "try to get my balance so I go back to work"  OBJECTIVE: All findings are from the initial evaluation unless otherwise dated   DIAGNOSTIC FINDINGS:  NEUROIMAGING REPORT   STUDY DATE: 02/05/2023 ... Impression This MRI of the cervical spine without contrast shows the  following:  The spinal cord appears normal.   There is no critical spinal stenosis.  At C6-C7 there are degenerative changes combining to cause mild spinal  stenosis and left greater than right foraminal narrowing.  Although there  is no definite nerve root compression the degenerative changes encroach  upon the C7 nerve roots.  Milder degenerative changes at the other cervical levels as detailed above  do not lead to spinal stenosis or nerve root compression. ...   NEUROIMAGING REPORT   STUDY DATE: 02/05/2023 ... Impression This MRI of the lumbar spine without contrast shows the  following:  The terminal spinal cord and the conus medullary medullaris appear normal.  Degenerative changes at L3-L4, L4-L5 and L5-S1 are noted but do not lead  to spinal stenosis or nerve root compression.   COGNITION: Overall cognitive status: Within functional limits for tasks assessed             SENSATION: Light touch: Impaired  on B LE   MUSCLE LENGTH: Hamstrings: mild restriction on B Gastrocnemius: mild restriction on B   POSTURE: rounded shoulders, forward head, decreased lumbar lordosis, and knees slightly flexed (standing)   LOWER EXTREMITY ROM:      Active   Right Eval Left Eval  Hip flexion [facility]  Hip extension  Hip abduction [facility]  Hip adduction [facility]  Hip internal rotation      Hip external rotation      Knee flexion [facility]  Knee extension [facility]  Ankle dorsiflexion [facility]  Ankle plantarflexion [facility]  Ankle inversion      Ankle eversion       (Blank rows = not tested)   LOWER EXTREMITY MMT:     MMT Right Eval Left Eval Right 05/21/23 Left 05/21/23  Hip flexion 3+ 3+ 4- 3+  Hip extension          Hip abduction 4- 3+ 4-4- 3+  Hip adduction 4- 4-   4-  Hip internal rotation          Hip external rotation          Knee flexion 3+ 3+ 4- 3+  Knee extension 3+ 3+ 4- 3+  Ankle dorsiflexion 3+ 3+ 4- 4-  Ankle plantarflexion 3+ 3+ 4- 3+  Ankle inversion          Ankle eversion          (Blank rows = not tested)   TRANSFERS: Assistive device utilized: None  Sit to stand: SBA Stand to sit: SBA   STAIRS: 05/21/23 Level of Assistance: SBA Stair Negotiation Technique: Alternating Pattern  with Single Rail on Right Number of Stairs: 4  Height of Stairs: 7"   Comments: Wide BOS, slightly staggering, knees slightly flexed, slightly unsteady   GAIT: Gait pattern: decreased arm swing- Right, decreased arm swing- Left, decreased step length- Right, decreased step length- Left, decreased stride length, decreased ankle dorsiflexion- Right, decreased ankle dorsiflexion- Left, knee flexed in stance- Right, knee flexed in stance- Left, decreased trunk rotation, trunk flexed, wide BOS, poor foot clearance- Right, and poor foot clearance- Left Distance walked: 140 ft Assistive device utilized: None Level of assistance: CGA Comments: mildly unsteady   FUNCTIONAL TESTS:  5 times sit to stand: 05/21/23: 33.77 sec from 34.43 sec 2 minute walk test: 05/21/23: 227 ft from 140 ft Dynamic Gait Index: 05/21/23: 10 from 8   DGI 05/21/23 1. Gait level surface (1) Moderate Impairment: Walks 20', slow speed, abnormal gait  pattern, evidence for imbalance. 2. Change in gait speed (1) Moderate Impairment: Makes only minor adjustments to walking speed, or accomplishes a change in speed with significant gait deviations, or changes speed but has significant gait deviations, or changes speed but loses balance but is able to recover and continue walking. 3. Gait with horizontal head turns (1) Moderate Impairment: Performs head turns with moderate change in gait velocity, slows down, staggers but recovers, can continue to walk. 4. Gait with vertical head turns 1) Moderate Impairment: Performs head turns with moderate change in gait velocity, slows down, staggers but recovers, can continue to walk. 5. Gait and pivot turn (1) Moderate Impairment: Turns slowly, requires verbal cueing, requires several small steps to catch balance following turn and stop. 6. Step over obstacle (1) Moderate Impairment: Is able to step over box but must stop, then step over. May require verbal cueing. 7. Step around obstacles (2) Mild Impairment: Is able to step around both cones, but must slow down and adjust steps to clear cones. 8. Stairs (2) Mild Impairment: Alternating feet, must use rail.   TOTAL SCORE: 10 / 24   DGI (evaluation) 1. Gait level surface (1) Moderate Impairment: Walks 20', slow speed, abnormal gait pattern, evidence for imbalance. 2. Change in gait speed (1) Moderate Impairment: Makes  only minor adjustments to walking speed, or accomplishes a change in speed with significant gait deviations, or changes speed but has significant gait deviations, or changes speed but loses balance but is able to recover and continue walking. 3. Gait with horizontal head turns (1) Moderate Impairment: Performs head turns with moderate change in gait velocity, slows down, staggers but recovers, can continue to walk. 4. Gait with vertical head turns 1) Moderate Impairment: Performs head turns with moderate change in gait velocity, slows  down, staggers but recovers, can continue to walk. 5. Gait and pivot turn (1) Moderate Impairment: Turns slowly, requires verbal cueing, requires several small steps to catch balance following turn and stop. 6. Step over obstacle (1) Moderate Impairment: Is able to step over box but must stop, then step over. May require verbal cueing. 7. Step around obstacles (1) Moderate Impairment: Is able to clear cones but must significantly slow, speed to accomplish task, or requires verbal cueing. 8. Stairs (1) Moderate Impairment: Two feet to a stair, must use rail.   TOTAL SCORE: 8 / 24   PATIENT SURVEYS:  LEFS 05/21/23 8/80 = 10% from 6/80 = 7.5%  TODAY'S TREATMENT:                                                                                                                              DATE:  06/27/23 Recumbent stepper, arm 8, seat 13, level 3, > 70 SPM x 5'  Seated hamstring stretch x 30" x 3 Gastrocsoleus slant board stretch x 30" x 3 Rocking back/forth with arm and heel/toe raises x 10 x 2 Chop/lift, blue med ball, foam, normal, BOS x 10 Tandem stance, firm surface, trunk rotation with blue med ball x 10 on each Walking backwards, RTB, 5 rounds in // bars Walking sideways, RTB, 4 rounds in // bars Bodycraft Walking forward/backward, 3 plates 3 rounds Cybex hamstring curls, 3 plates x 10 x 2  06/25/23 Seated hamstring stretch x 30" x 3 Gastrocsoleus slant board stretch x 30" x 3 Chop/lift, yellow med ball, foam, normal, BOS x 10 Tandem stance, firm surface, trunk rotation with yellow med ball x 10 on each Walking backwards, 20 ft x 3 rounds Walking sideways, 20 ft x 3 rounds Bodycraft TKE, 3 plates x 3" x 10 Recumbent stepper, arm 8, seat 13, level 3, > 70 SPM x 5'   06/04/23 Recumbent stepper, arm 8, seat 13, level 3, > 70 SPM x 5'  Seated hamstring stretch x 30" x 3 Gastrocsoleus slant board stretch x 30" x 3 Tandem stance, firm surface, trunk rotation with yellow med ball  x 10 on each Rebounding ball on trampoline, firm surface, feet together x 30" Chop/lift, yellow med ball, foam, normal, BOS x 10 Resisted walking backwards/forwards with bodycraft, 3 plates x 2 rounds Bodycraft TKE, 3 plates x 3" x 10  05/28/23: -Nustep AA/ROM, cardio conditioning, seat 13, arms 10, level 3 x 5 minutes (level increased  today)  *already stretched calves prior to coming in so this is eschewed   -hands free STS from elevated plinth 1x10 -stabilization marching on dynadisc 1x20 reciprocal pattern  -hands free STS from elevated plinth 1x10 -stabilization marching on dynadisc 1x20 reciprocal pattern   -quite sitting, proprioceptive training on dynadisc, eyes closed 1x30 (too long), then 5x15sec  -4-square stepping pattern for balance training: 44ft fwd, 51ft Right, 4 feet retro (counter support available prn) 48ft left; (5 rounds then a break due to rubbery legs)  Backward walking remains most provoking to imbalance  *rest break  -4-square stepping pattern for balance training: 82ft fwd, 62ft Right, 4 feet retro (counter support available prn) 64ft left; (5 rounds then a break due to rubbery legs)  *rest break   -airex stance rebounding off wall over head x10, off floor x10, then alteranting floor/wall x6  05/21/23 Progress note (5TSTS, , DGI, MMT, LEFS) Recumbent stepper, arm 8, seat 13, level 3, > 60 SPM x 5'  Gastrocsoleus slant board stretch x 30" x 3  05/16/23 Recumbent stepper, arm 8, seat 13, level 2, > 70 SPM x 5'  Gastrocsoleus slant board stretch x 30" x 3 Standing:  Rocking back and forth with arm raise, one LE forward x 10 x 2 on each  Chop/lift, yellow med ball, foam, normal, BOS x 10  Tandem stance, firm surface, with push/pull with a dowel x 3" x 10 on each  Head nods/turns, foam, eyes open x 10  Bodycraft standing TKE x 3" x 10 x 2 plates each  16/1/09 Recumbent stepper, arm 8, seat 13, level 2, > 70 SPM x 5'  Gastrocsoleus slant board stretch x 30"  x 3 Standing:  Rocking back and forth with arm raise, one LE forward x 10 x 2 on each  Chop/lift, yellow med ball, foam, normal, BOS x 10  Tandem stance, firm surface, with push/pull with a dowel x 3" x 10 on each  Bodycraft standing TKE x 3" x 10 x 1 plate each  60/4/54 Education on sensory re-education techniques for 5 minutes beginning with softer textures like a satin cloth then progressed to rougher texture like a towel (gave excellent verbal understanding) Recumbent stepper, arm 8, seat 13, level 2, > 70 SPM x 5'  Gastrocsoleus slant board stretch x 30" x 3 Standing:  Heel raises x 10 x 2 x 2 lbs  Toe raises x 10 x 2 x 2 lbs  High marches x 10 x 2 x 2 lbs  Rocking back and forth with arm raise, one LE forward x 10 x 2 on each  Chop/lift, yellow med ball, foam, normal, BOS x 10  Tandem stance, firm surface, with push/pull with a dowel x 3" x 10 x 2 on each  Resisted walking backwards/forwards with bodycraft, 2 plates x 2 rounds  05/02/23 Recumbent stepper, arm 8, seat 13, level 3, > 60 SPM x 5'  Gastrocsoleus slant board stretch x 30" x 3 Standing:  Mini squats, yellow med ball x 3" x 10 x 2  Heel raises x 10 x 2  Toe raises x 10 x 2  High marches x 10 x 2  Rhythmic stabilization, foam, normal BOS, eyes open x ant/post x 3" x 10 x 2  Chop/lift, yellow med ball, foam, normal, BOS x 10  Tandem stance, firm surface, eyes open x 30" x 2  TKE, Bodycraft, 1 plate x 10 x 2 x 3" each  Resisted walking backwards with bodycraft,  2 plates x 3 rounds  04/25/23 Recumbent stepper, arm 8, seat 13, level 1, > 60 SPM x 5'  Gastrocsoleus slant board stretch x 30" x 3 Standing:  Rhythmic stabilization, firm surface, normal BOS, eyes open x ant/post x 3" x 10 x 2  Yellow med ball raises, firm surface, normal, BOS, eyes open, x 10  Tandem stance, firm surface, eyes open x 30" x 2  TKE, RTB x 10 x 2 x 3"  Resisted walking backwards with bodycraft, 2 plates x 5 rounds  04/24/23 Recumbent  stepper, arm 8, seat 13, level 1, > 60 SPM x 5' Seated Gastrocsoleus stretch with a strap x 30" x 3 Standing:  Mini squats x 3" x 10 x 2  Hip vectors, RTB x 10 x 2  Heel/toe raises x 10 x 2  TKE, RTB x 10 x 2 x 3"  04/17/23 Evaluation and patient education done Gait training with a quad cane with CGA for around 80 ft    PATIENT EDUCATION: Education details: Updated written HEP Person educated: Patient Education method: Explanation Education comprehension: verbalized understanding  HOME EXERCISE PROGRAM: Access Code: [email or web address] URL: [email or web address] 06/04/23 - Seated Hamstring Stretch  - 1-2 x daily - 7 x weekly - 3 reps - 30 hold  05/02/2023 - Standing March with Counter Support  - 1-2 x daily - 7 x weekly - 2 sets - 10 reps  04/25/2023 - Tandem Stance with Support  - 1-2 x daily - 7 x weekly - 2 reps - 30 hold  Date: 04/24/2023 Prepared by: Krystal Clark  Exercises - Seated Calf Stretch with Strap  - 1-2 x daily - 7 x weekly - 3 reps - 30 hold - Mini Squat with Counter Support  - 1-2 x daily - 7 x weekly - 2 sets - 10 reps - 3 hold - Standing 3-Way Leg Reach with Resistance at Ankles and Counter Support  - 1-2 x daily - 7 x weekly - 2 sets - 10 reps - Heel Toe Raises with Counter Support  - 1-2 x daily - 7 x weekly - 2 sets - 10 reps  GOALS: Goals reviewed with patient? Yes  SHORT TERM GOALS: Target date: 06/11/23  Pt will demonstrate indep in HEP to facilitate carry-over of skilled services and improve functional outcomes Goal status: MET  2.  Pt will decrease 5TSTS by at least 3 seconds in order to demonstrate clinically significant improvement in LE strength   Baseline: 33.77 sec Goal status: IN PROGRESS  3.  Pt will increase by at least 40 ft in order to demonstrate clinically significant improvement in community ambulation Baseline: 277 ft Goal status: IN PROGRESS  4.  Pt will increase LEFS by at least 9 points in order to  demonstrate significant improvement in lower extremity function.  Baseline: 8 Goal status: IN PROGRESS  LONG TERM GOALS: Target date: 07/10/23  Pt will decrease 5TSTS by at least 6 seconds in order to demonstrate clinically significant improvement in LE strength   Baseline: 33.77 sec Goal status: IN PROGRESS  2.  Pt will increase by at least 80 ft in order to demonstrate clinically significant improvement in community ambulation Baseline: 277 ft Goal status: IN PROGRESS  3.  Pt will increase LEFS by at least 18 points in order to demonstrate significant improvement in lower extremity function.  Baseline: 8 Goal status: IN PROGRESS  4.  Pt will demonstrate increase in LE strength to 4 to facilitate ease  and safety in ambulation  Baseline: 3+ Goal status: IN PROGRESS  5.  Pt will improve DGI by at least 3 points in order to demonstrate clinically significant improvement in balance and decreased risk for falls  Baseline: 10 Goal status:IN PROGRESS  ASSESSMENT:  CLINICAL IMPRESSION: Interventions today were geared towards balance, LE strengthening and flexibility. Tolerated all activities with slight increase in fatigue (RPE 9/10). Pacing of activities was still slow. Frequent and prolonged rest periods given. Still demonstrated mild unsteadiness seen with trunk rotation in tandem stance and chop/lift due to impaired proprioception. Provided slight amount of cueing to ensure correct execution of activity with good carry-over. To date, skilled PT is required to address the impairments and improve function.   PROGRESS NOTE 05/21/23: Patient demonstrated continued improvements in function as indicated by positive significant changes in activity tolerance as indicated by improvement in . However, patient still presents with deficits in balance, gait, and strength as indicated by DGI. With this, skilled PT is still required to address the impairments and functional limitations listed  below. Interventions today were geared towards LE strengthening and flexibility. Tolerated all activities with mild fatigue. Pacing of activities was still slow. Rest periods provided. Provided slight amount of cueing to ensure correct execution of activity with good carry-over. To date, skilled PT is required to address the impairments and improve function.     EVAL: Patient is a 47 y.o. male who was seen today for physical therapy evaluation and treatment for Chronic Inflammatory Demyelinating Polyneuritis (CIDP). Patient was diagnosed with CIDP by referring provider further defined by difficulty with ambulation and negotiating stairs due to pain, weakness, impaired balance/proprioception and decreased soft tissue extensibility. Skilled PT is required to address the impairments and functional limitations listed below. Due to weakness in grip, patient may also need OT to address patient's issues with ADLs.Interventions today were geared towards gait training for safety. Tolerated all activities with improved confidence with safety when ambulating. May use a quad cane as needed. Demonstrated mild levels of fatigue. Rest periods provided. Provided slight amount of cueing to ensure correct execution of activity with good carry-over.      OBJECTIVE IMPAIRMENTS: Abnormal gait, decreased activity tolerance, decreased balance, decreased endurance, decreased mobility, difficulty walking, decreased strength, impaired flexibility, impaired sensation, and pain.   ACTIVITY LIMITATIONS: carrying, lifting, bending, sitting, standing, squatting, stairs, transfers, bed mobility, continence, bathing, toileting, dressing, self feeding, reach over head, hygiene/grooming, locomotion level, and caring for others  PARTICIPATION LIMITATIONS: meal prep, cleaning, laundry, driving, shopping, community activity, occupation, and yard work  PERSONAL FACTORS: Time since onset of injury/illness/exacerbation and nature of the  condition  are also affecting patient's functional outcome.   REHAB POTENTIAL: Fair    CLINICAL DECISION MAKING: Evolving/moderate complexity  EVALUATION COMPLEXITY: Moderate  PLAN:  PT FREQUENCY: 2x/week  PT DURATION: 8 weeks  PLANNED INTERVENTIONS: Therapeutic exercises, Therapeutic activity, Neuromuscular re-education, Balance training, Gait training, Patient/Family education, Self Care, Stair training, Orthotic/Fit training, Electrical stimulation, Manual therapy, and Re-evaluation  PLAN FOR NEXT SESSION: Continue POC and may progress as tolerated with emphasis on LE flexibility, functional strengthening, balance and gait activities.   Tish Frederickson. Eivan Gallina, PT, DPT, OCS Board-Certified Clinical Specialist in Orthopedic PT PT Compact Privilege # ([place]): [id number] [facility]  [phone]

## 2023-06-30 ENCOUNTER — Encounter (HOSPITAL_COMMUNITY): Payer: 59

## 2023-07-08 ENCOUNTER — Ambulatory Visit: Payer: 59 | Admitting: Neurology

## 2023-08-04 ENCOUNTER — Ambulatory Visit (INDEPENDENT_AMBULATORY_CARE_PROVIDER_SITE_OTHER): Payer: 59 | Admitting: Neurology

## 2023-08-04 ENCOUNTER — Encounter: Payer: Self-pay | Admitting: Neurology

## 2023-08-04 VITALS — BP 112/80 | HR 112 | Ht 74.0 in | Wt 245.0 lb

## 2023-08-04 DIAGNOSIS — G40909 Epilepsy, unspecified, not intractable, without status epilepticus: Secondary | ICD-10-CM

## 2023-08-04 DIAGNOSIS — R208 Other disturbances of skin sensation: Secondary | ICD-10-CM

## 2023-08-04 DIAGNOSIS — G6181 Chronic inflammatory demyelinating polyneuritis: Secondary | ICD-10-CM

## 2023-08-04 DIAGNOSIS — R269 Unspecified abnormalities of gait and mobility: Secondary | ICD-10-CM

## 2023-08-04 MED ORDER — OXCARBAZEPINE 300 MG PO TABS: ORAL_TABLET | ORAL | 5 refills | Status: DC

## 2023-08-04 NOTE — Progress Notes (Signed)
[facility]  PATIENT: Connor Copeland DOB: Sep 06, 1975  REFERRING DOCTOR OR PCP: Nita Sells, MD SOURCE: Patient, note from primary care, note from emergency room, laboratory results  _________________________________   HISTORICAL  CHIEF COMPLAINT:  Chief Complaint  Patient presents with   Follow-up    Pt in room 11, mother in room. Here for Polyneuropathy follow up.  Pt said neuropathy has worsened leg and arms.  Still having tingling, numbness, hand constant numb and shake. Pt uses walker at home. Pt said he had 2 falls about 2 weeks ago, fell and hit shoulder, no head injury.    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  Update 08/04/2023 Since his last visit and EMG, his polyneuropathy has worsened.  Specifically, currently numbness is mid thigh and below in the legs and from just above the wrist and below in the hands.  He continues to note some weakness in the feet and hands and feels that that has also worsened over the past few months.  Due to the increased numbness and weakness, gait is much worse.  He has fallen several times.  He can no longer walk without a walker.  In dim light or if he closes his eyes, he is very unstable.  He did not get any significant benefit from prednisone and we are currently tapering this off and will begin IVIG.  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    CSF was normal  Besides the numbness, he continues to experience a lot of pain in his legs.    Gabapentin was not tolerated due to leg swelling.   Duloxetine has not helped.   Lamotrigine 100 mg po bid is tolerated but did not help so he stopped.  Nortriptyline at night has helped some.      He had a seizure in June 2024.   EEG was normal.    He reports sleeping  poorly due to pain with frequent awakenings.  Pain is a little better at night when he gets up and walks.  Bladder frequency is unchanged.  He has gout.  He has not taken colchicine recently.  Polyneuropathy history: He had the onset of leg numbness and pain in mid 2024.Marland Kitchen  NCV/EMG 03/19/2023 was consistent with CIDP.  Symptoms worsened further August through December 2024 despite being placed on prednisone.     He took gabapentin but reports his legs swelled up when he took it.  Therefore, he stopped.  He also was prescribed hydrocodone bit feels it has not helped.     NCV/EMG 03/19/2023 This is an abnormal study.  There is electrodiagnostic evidence of chronic demyelinating polyradiculoneuropathy based on evidence of multiple mild to moderately prolonged F-wave latency, neuropathic changes at needle examinations.   Labs: 12/10/2022:  ESR, CRP, Vit D and B12 were normal   01/10/2023:   B12, HIV, CBC/D were normal.   Calcium mildly reduced (but Albumin also mildly reduced)   618/2024:  SPEP.IEF was normal.   IgG normal   REVIEW OF SYSTEMS: Constitutional: No fevers, chills, sweats, or change in appetite Eyes: No visual changes, double vision, eye pain Ear, nose and throat: No hearing loss, ear pain, nasal congestion, sore throat Cardiovascular: No chest pain, palpitations Respiratory:  No shortness of breath at rest or with exertion.   No wheezes GastrointestinaI: No nausea, vomiting, diarrhea,  abdominal pain, fecal incontinence Genitourinary:  No dysuria, urinary retention or frequency.  No nocturia. Musculoskeletal: As above.  He has gout. Integumentary: No rash, pruritus, skin lesions Neurological: as above Psychiatric: No depression at this time.  No anxiety Endocrine: No palpitations, diaphoresis, change in appetite, change in weigh or increased thirst Hematologic/Lymphatic:  No anemia, purpura, petechiae. Allergic/Immunologic: No itchy/runny eyes, nasal congestion, recent allergic  reactions, rashes  ALLERGIES: Allergies  Allergen Reactions   Gabapentin Swelling    Swelling off the legs    HOME MEDICATIONS:  Current Outpatient Medications:    DULoxetine (CYMBALTA) 60 MG capsule, Take 1 capsule (60 mg total) by mouth daily., Disp: 30 capsule, Rfl: 11   HYDROcodone-acetaminophen (NORCO) 7.5-325 MG tablet, Take 1 tablet by mouth 3 (three) times daily as needed., Disp: , Rfl:    Oxcarbazepine (TRILEPTAL) 300 MG tablet, one po qAM and two po qHS, Disp: 90 tablet, Rfl: 5   potassium chloride SA (KLOR-CON M) 20 MEQ tablet, Take 20 mEq by mouth 2 (two) times daily., Disp: , Rfl:    predniSONE (DELTASONE) 20 MG tablet, Take 4 pills (80 mg) p.o. daily for 6 weeks.  Then take 3 pills p.o. daily x 2 weeks.  Then take 2 pills p.o. daily x 2 weeks.  Then take 1 pill a day until the follow-up visit with your neurologist, Disp: 120 tablet, Rfl: 3   traMADol (ULTRAM) 50 MG tablet, Take 1 tablet (50 mg total) by mouth in the morning, at noon, in the evening, and at bedtime., Disp: 120 tablet, Rfl: 3   ULORIC 80 MG TABS, Take 40 mg by mouth daily., Disp: , Rfl:    furosemide (LASIX) 20 MG tablet, Take 2 tablets (40 mg total) by mouth daily for 3 days. (Patient not taking: Reported on 01/28/2023), Disp: 6 tablet, Rfl: 0   LORazepam (ATIVAN) 0.5 MG tablet, Take 0.5 mg by mouth daily. (Patient not taking: Reported on 02/13/2023), Disp: , Rfl:    magnesium oxide (MAG-OX) 400 (240 Mg) MG tablet, Take 1 tablet (400 mg total) by mouth 2 (two) times daily. (Patient not taking: Reported on 08/04/2023), Disp: 60 tablet, Rfl: 0   nicotine (NICODERM CQ - DOSED IN MG/24 HOURS) 21 mg/24hr patch, Place 1 patch (21 mg total) onto the skin daily. (Patient not taking: Reported on 08/04/2023), Disp: 28 patch, Rfl: 1   nortriptyline (PAMELOR) 25 MG capsule, Take 2 capsules (50 mg total) by mouth at bedtime. (Patient not taking: Reported on 08/04/2023), Disp: 60 capsule, Rfl: 11   oxyCODONE-acetaminophen  (PERCOCET/ROXICET) 5-325 MG tablet, Take 1 tablet by mouth every 6 (six) hours as needed for moderate pain. (Patient not taking: Reported on 08/04/2023), Disp: 12 tablet, Rfl: 0  PAST MEDICAL HISTORY: Past Medical History:  Diagnosis Date   ETOH abuse    Gout     PAST SURGICAL HISTORY: Past Surgical History:  Procedure Laterality Date   FOOT SURGERY      FAMILY HISTORY: Family History  Problem Relation Age of Onset   COPD Father     SOCIAL HISTORY: Social History   Socioeconomic History   Marital status: Divorced    Spouse name: Not on file   Number of children: Not on file   Years of education: Not on file   Highest education level: Not on file  Occupational History   Not on file  Tobacco Use   Smoking status: Every Day    Current packs/day: 1.00    Types: Cigarettes  Smokeless tobacco: Never  Substance and Sexual Activity   Alcohol use: Yes    Alcohol/week: 42.0 standard drinks of alcohol    Types: 42 Standard drinks or equivalent per week    Comment: 5-6 "glasses of bourbon daily"   Drug use: No   Sexual activity: Not on file  Other Topics Concern   Not on file  Social History Narrative   Right Handed   No Caffeine    Social Drivers of Health   Financial Resource Strain: Not on file  Food Insecurity: No Food Insecurity (01/28/2023)   Hunger Vital Sign    Worried About Running Out of Food in the Last Year: Never true    Ran Out of Food in the Last Year: Never true  Transportation Needs: No Transportation Needs (01/28/2023)   PRAPARE - Administrator, Civil Service (Medical): No    Lack of Transportation (Non-Medical): No  Physical Activity: Not on file  Stress: Not on file  Social Connections: Not on file  Intimate Partner Violence: Not At Risk (01/28/2023)   Humiliation, Afraid, Rape, and Kick questionnaire    Fear of Current or Ex-Partner: No    Emotionally Abused: No    Physically Abused: No    Sexually Abused: No       PHYSICAL  EXAM  Vitals:   08/04/23 1425  BP: 112/80  Pulse: (!) 112  Weight: 245 lb (111.1 kg)  Height: 6\' 2"  (1.88 m)    Body mass index is 31.46 kg/m.   General: The patient is well-developed and well-nourished and in no acute distress  HEENT:  Head is [place]/AT.  Sclera are anicteric.    Skin: Extremities are without rash or  edema.  Musculoskeletal: He has some tenderness over the muscles  Neurologic Exam  Mental status: The patient is alert and oriented x 3 at the time of the examination. The patient has apparent normal recent and remote memory, with an apparently normal attention span and concentration ability.   Speech is normal.  Cranial nerves: Extraocular movements are full.  There is good facial sensation to soft touch bilaterally.Facial strength is normal.  Trapezius and sternocleidomastoid strength is normal. No dysarthria is noted.   . No obvious hearing deficits are noted.  Motor:  Muscle bulk is normal.   Tone is normal. Strength is  4/5 in toe extension and ankle extension and 4-4+ intrinsic hand muscles.    Sensory: He has reduced sensation to pinprick and temperature from mid forearm and down.  This is length-dependent, worse in the fingers.  He also has reduced vibration sensation at the wrist and absent vibration sensation in the fingers.  In the legs, pinprick sensation is reduced from mid thigh down, absent from below the knee to the foot.  Vibration sensation is absent in the feet and knees.  This has worsened since the last visit.  Coordination: Cerebellar testing reveals good finger-nose-finger and reduced heel-to-shin bilaterally.  Gait and station: He is unable to stand without unilateral support and cannot take any steps without bilateral support.   He cannot tandem walk.  Romberg is positive.   Reflexes: Deep tendon reflexes are symmetric and 1+ in arms but absent in knees and ankle.       DIAGNOSTIC DATA (LABS, IMAGING, TESTING) - I reviewed patient records,  labs, notes, testing and imaging myself where available.  Lab Results  Component Value Date   WBC 6.6 02/03/2023   HGB 12.8 (L) 02/03/2023   HCT  39.0 02/03/2023   MCV 103.7 (H) 02/03/2023   PLT 311 02/03/2023      Component Value Date/Time   NA 135 02/03/2023 0502   K 3.5 02/03/2023 0502   CL 103 02/03/2023 0502   CO2 22 02/03/2023 0502   GLUCOSE 88 02/03/2023 0502   BUN 6 02/03/2023 0502   CREATININE 0.57 (L) 02/03/2023 0502   CALCIUM 8.2 (L) 02/03/2023 0502   PROT 4.7 (L) 02/02/2023 0508   PROT 5.4 (L) 01/21/2023 1614   ALBUMIN 2.1 (L) 02/02/2023 0508   AST 34 02/02/2023 0508   ALT 36 02/02/2023 0508   ALKPHOS 80 02/02/2023 0508   BILITOT 0.9 02/02/2023 0508   GFRNONAA >60 02/03/2023 0502    Lab Results  Component Value Date   VITAMINB12 614 01/28/2023   Lab Results  Component Value Date   TSH 2.199 01/29/2023       ASSESSMENT AND PLAN  CIDP (chronic inflammatory demyelinating polyneuropathy) (HCC)  Seizure disorder (HCC)  Dysesthesia  Gait disturbance   He has CIDP and has progressed.   He did not get improvement with prednisone and we will start IVIg.  Weight is 240 pounds and will have him do 100 g q 4 weeks to start Add oxcarbazepine for neuropathic pain and increase to 300 mg po qAM and 600 mg po qPM Due to ataxia and weakness from the CIDP, he is currently disabled.  If he receives a benefit after several rounds of IVIg, he might be able to return to work in the future.  I will write out until November 04, 2023 RTC 4 months    Brayson Livesey A. Epimenio Foot, MD, [facility] 08/04/2023, 3:17 PM Certified in Neurology, Clinical Neurophysiology, Sleep Medicine and Neuroimaging  [facility] [address] [place], [place] [address] [phone]

## 2023-08-08 ENCOUNTER — Other Ambulatory Visit: Payer: Self-pay

## 2023-08-08 ENCOUNTER — Inpatient Hospital Stay (HOSPITAL_COMMUNITY)
Admission: EM | Admit: 2023-08-08 | Discharge: 2023-08-19 | DRG: 073 | Disposition: A | Discharge status: Rehabilitation Facility | Payer: 59 | Attending: Internal Medicine | Admitting: Internal Medicine

## 2023-08-08 DIAGNOSIS — I471 Supraventricular tachycardia, unspecified: Secondary | ICD-10-CM | POA: Diagnosis not present

## 2023-08-08 DIAGNOSIS — F10229 Alcohol dependence with intoxication, unspecified: Secondary | ICD-10-CM | POA: Diagnosis present

## 2023-08-08 DIAGNOSIS — F10231 Alcohol dependence with withdrawal delirium: Secondary | ICD-10-CM | POA: Diagnosis present

## 2023-08-08 DIAGNOSIS — E871 Hypo-osmolality and hyponatremia: Secondary | ICD-10-CM | POA: Diagnosis present

## 2023-08-08 DIAGNOSIS — Z79899 Other long term (current) drug therapy: Secondary | ICD-10-CM | POA: Diagnosis not present

## 2023-08-08 DIAGNOSIS — Z781 Physical restraint status: Secondary | ICD-10-CM

## 2023-08-08 DIAGNOSIS — E781 Pure hyperglyceridemia: Secondary | ICD-10-CM | POA: Diagnosis present

## 2023-08-08 DIAGNOSIS — F10232 Alcohol dependence with withdrawal with perceptual disturbance: Secondary | ICD-10-CM | POA: Diagnosis present

## 2023-08-08 DIAGNOSIS — R269 Unspecified abnormalities of gait and mobility: Secondary | ICD-10-CM

## 2023-08-08 DIAGNOSIS — I2510 Atherosclerotic heart disease of native coronary artery without angina pectoris: Secondary | ICD-10-CM | POA: Diagnosis present

## 2023-08-08 DIAGNOSIS — I4719 Other supraventricular tachycardia: Secondary | ICD-10-CM | POA: Diagnosis present

## 2023-08-08 DIAGNOSIS — G629 Polyneuropathy, unspecified: Secondary | ICD-10-CM

## 2023-08-08 DIAGNOSIS — F1091 Alcohol use, unspecified, in remission: Secondary | ICD-10-CM | POA: Diagnosis present

## 2023-08-08 DIAGNOSIS — Z7952 Long term (current) use of systemic steroids: Secondary | ICD-10-CM

## 2023-08-08 DIAGNOSIS — E785 Hyperlipidemia, unspecified: Secondary | ICD-10-CM | POA: Diagnosis present

## 2023-08-08 DIAGNOSIS — F10932 Alcohol use, unspecified with withdrawal with perceptual disturbance: Secondary | ICD-10-CM

## 2023-08-08 DIAGNOSIS — E876 Hypokalemia: Secondary | ICD-10-CM | POA: Diagnosis present

## 2023-08-08 DIAGNOSIS — Z888 Allergy status to other drugs, medicaments and biological substances status: Secondary | ICD-10-CM

## 2023-08-08 DIAGNOSIS — E669 Obesity, unspecified: Secondary | ICD-10-CM | POA: Diagnosis present

## 2023-08-08 DIAGNOSIS — D539 Nutritional anemia, unspecified: Secondary | ICD-10-CM | POA: Diagnosis present

## 2023-08-08 DIAGNOSIS — F17210 Nicotine dependence, cigarettes, uncomplicated: Secondary | ICD-10-CM | POA: Diagnosis present

## 2023-08-08 DIAGNOSIS — G9341 Metabolic encephalopathy: Secondary | ICD-10-CM | POA: Diagnosis present

## 2023-08-08 DIAGNOSIS — E162 Hypoglycemia, unspecified: Secondary | ICD-10-CM | POA: Insufficient documentation

## 2023-08-08 DIAGNOSIS — G40909 Epilepsy, unspecified, not intractable, without status epilepticus: Secondary | ICD-10-CM | POA: Diagnosis present

## 2023-08-08 DIAGNOSIS — E872 Acidosis, unspecified: Secondary | ICD-10-CM | POA: Diagnosis present

## 2023-08-08 DIAGNOSIS — T380X5A Adverse effect of glucocorticoids and synthetic analogues, initial encounter: Secondary | ICD-10-CM | POA: Diagnosis present

## 2023-08-08 DIAGNOSIS — Z5971 Insufficient health insurance coverage: Secondary | ICD-10-CM

## 2023-08-08 DIAGNOSIS — Z6831 Body mass index (BMI) 31.0-31.9, adult: Secondary | ICD-10-CM

## 2023-08-08 DIAGNOSIS — G6181 Chronic inflammatory demyelinating polyneuritis: Secondary | ICD-10-CM | POA: Diagnosis present

## 2023-08-08 DIAGNOSIS — E1165 Type 2 diabetes mellitus with hyperglycemia: Secondary | ICD-10-CM | POA: Diagnosis present

## 2023-08-08 DIAGNOSIS — M109 Gout, unspecified: Secondary | ICD-10-CM | POA: Diagnosis present

## 2023-08-08 DIAGNOSIS — R2689 Other abnormalities of gait and mobility: Secondary | ICD-10-CM | POA: Diagnosis present

## 2023-08-08 DIAGNOSIS — F1010 Alcohol abuse, uncomplicated: Secondary | ICD-10-CM | POA: Diagnosis not present

## 2023-08-08 DIAGNOSIS — Z825 Family history of asthma and other chronic lower respiratory diseases: Secondary | ICD-10-CM | POA: Diagnosis not present

## 2023-08-08 DIAGNOSIS — D649 Anemia, unspecified: Secondary | ICD-10-CM | POA: Diagnosis present

## 2023-08-08 DIAGNOSIS — R739 Hyperglycemia, unspecified: Secondary | ICD-10-CM | POA: Diagnosis present

## 2023-08-08 LAB — URINALYSIS, ROUTINE W REFLEX MICROSCOPIC
Bacteria, UA: NONE SEEN
Bilirubin Urine: NEGATIVE
Glucose, UA: >=500 mg/dL — AB
Hgb urine dipstick: NEGATIVE
Ketones, ur: 5 mg/dL — AB
Leukocytes,Ua: NEGATIVE
Nitrite: NEGATIVE
Protein, ur: NEGATIVE mg/dL
Specific Gravity, Urine: 1.026 (ref 1.005–1.030)
pH: 6 (ref 5.0–8.0)

## 2023-08-08 LAB — BASIC METABOLIC PANEL
Anion gap: 21 — ABNORMAL HIGH (ref 5–15)
BUN: 10 mg/dL (ref 6–20)
CO2: 26 mmol/L (ref 22–32)
Calcium: 9.2 mg/dL (ref 8.9–10.3)
Chloride: 78 mmol/L — ABNORMAL LOW (ref 98–111)
Creatinine, Ser: 0.82 mg/dL (ref 0.61–1.24)
GFR, Estimated: >60 mL/min (ref >60)
Glucose, Bld: 559 mg/dL (ref 70–99)
Potassium: 2.6 mmol/L — CL (ref 3.5–5.1)
Sodium: 125 mmol/L — ABNORMAL LOW (ref 135–145)

## 2023-08-08 LAB — CBC WITH DIFFERENTIAL/PLATELET
Abs Immature Granulocytes: 0.39 10*3/uL — ABNORMAL HIGH (ref 0.00–0.07)
Basophils Absolute: 0.1 10*3/uL (ref 0.0–0.1)
Basophils Relative: 1 %
Eosinophils Absolute: 0 10*3/uL (ref 0.0–0.5)
Eosinophils Relative: 0 %
HCT: 37.8 % — ABNORMAL LOW (ref 39.0–52.0)
Hemoglobin: 13.6 g/dL (ref 13.0–17.0)
Immature Granulocytes: 4 %
Lymphocytes Relative: 31 %
Lymphs Abs: 3.2 10*3/uL (ref 0.7–4.0)
MCH: 31.7 pg (ref 26.0–34.0)
MCHC: 36 g/dL (ref 30.0–36.0)
MCV: 88.1 fL (ref 80.0–100.0)
Monocytes Absolute: 0.7 10*3/uL (ref 0.1–1.0)
Monocytes Relative: 6 %
Neutro Abs: 5.9 10*3/uL (ref 1.7–7.7)
Neutrophils Relative %: 58 %
Platelets: 386 10*3/uL (ref 150–400)
RBC: 4.29 MIL/uL (ref 4.22–5.81)
RDW: 14.1 % (ref 11.5–15.5)
WBC: 10.2 10*3/uL (ref 4.0–10.5)
nRBC: 1.4 % — ABNORMAL HIGH (ref 0.0–0.2)

## 2023-08-08 LAB — RAPID URINE DRUG SCREEN, HOSP PERFORMED
Amphetamines: NOT DETECTED
Barbiturates: NOT DETECTED
Benzodiazepines: NOT DETECTED
Cocaine: NOT DETECTED
Opiates: POSITIVE — AB
Tetrahydrocannabinol: POSITIVE — AB

## 2023-08-08 LAB — NA AND K (SODIUM & POTASSIUM), RAND UR
Potassium Urine: 11 mmol/L
Sodium, Ur: <10 mmol/L

## 2023-08-08 LAB — FOLATE: Folate: 4.9 ng/mL — ABNORMAL LOW (ref >5.9)

## 2023-08-08 LAB — PROTIME-INR
INR: 1 (ref 0.8–1.2)
Prothrombin Time: 13.1 s (ref 11.4–15.2)

## 2023-08-08 LAB — MAGNESIUM: Magnesium: 1.7 mg/dL (ref 1.7–2.4)

## 2023-08-08 LAB — CBG MONITORING, ED: Glucose-Capillary: 407 mg/dL — ABNORMAL HIGH (ref 70–99)

## 2023-08-08 LAB — VITAMIN B12: Vitamin B-12: 1155 pg/mL — ABNORMAL HIGH (ref 180–914)

## 2023-08-08 LAB — TSH: TSH: 0.911 u[IU]/mL (ref 0.350–4.500)

## 2023-08-08 LAB — OSMOLALITY, URINE: Osmolality, Ur: 610 mosm/kg (ref 300–900)

## 2023-08-08 LAB — SODIUM: Sodium: 127 mmol/L — ABNORMAL LOW (ref 135–145)

## 2023-08-08 LAB — PHOSPHORUS: Phosphorus: 2.7 mg/dL (ref 2.5–4.6)

## 2023-08-08 MED ORDER — OXCARBAZEPINE 300 MG PO TABS
600.0000 mg | ORAL_TABLET | Freq: Every day | ORAL | Status: DC
  Administered 2023-08-08 – 2023-08-18 (×9): 600 mg via ORAL
  Filled 2023-08-08 (×14): qty 2

## 2023-08-08 MED ORDER — ONDANSETRON HCL 4 MG/2ML IJ SOLN: 4.0000 mg | Freq: Four times a day (QID) | INTRAMUSCULAR | Status: DC | PRN

## 2023-08-08 MED ORDER — IPRATROPIUM BROMIDE 0.02 % IN SOLN: 0.5000 mg | Freq: Four times a day (QID) | RESPIRATORY_TRACT | Status: DC | PRN

## 2023-08-08 MED ORDER — POTASSIUM CHLORIDE 10 MEQ/100ML IV SOLN
10.0000 meq | INTRAVENOUS | Status: AC
  Administered 2023-08-08 (×2): 10 meq via INTRAVENOUS
  Filled 2023-08-08 (×2): qty 100

## 2023-08-08 MED ORDER — LORAZEPAM 2 MG/ML IJ SOLN
1.0000 mg | INTRAMUSCULAR | Status: AC | PRN
  Administered 2023-08-08 – 2023-08-09 (×3): 2 mg via INTRAVENOUS
  Administered 2023-08-09 (×2): 4 mg via INTRAVENOUS
  Administered 2023-08-09: 3 mg via INTRAVENOUS
  Administered 2023-08-10: 2 mg via INTRAVENOUS
  Filled 2023-08-08: qty 1
  Filled 2023-08-08 (×3): qty 2
  Filled 2023-08-08 (×4): qty 1

## 2023-08-08 MED ORDER — HYDROCODONE-ACETAMINOPHEN 7.5-325 MG PO TABS
1.0000 | ORAL_TABLET | Freq: Three times a day (TID) | ORAL | Status: DC | PRN
  Administered 2023-08-12 – 2023-08-19 (×17): 1 via ORAL
  Filled 2023-08-08 (×17): qty 1

## 2023-08-08 MED ORDER — POTASSIUM CHLORIDE 10 MEQ/100ML IV SOLN
10.0000 meq | INTRAVENOUS | Status: DC
  Administered 2023-08-08: 10 meq via INTRAVENOUS
  Filled 2023-08-08: qty 100

## 2023-08-08 MED ORDER — HEPARIN SODIUM (PORCINE) 5000 UNIT/ML IJ SOLN
5000.0000 [IU] | Freq: Three times a day (TID) | INTRAMUSCULAR | Status: DC
  Administered 2023-08-08 – 2023-08-19 (×33): 5000 [IU] via SUBCUTANEOUS
  Filled 2023-08-08 (×33): qty 1

## 2023-08-08 MED ORDER — SODIUM CHLORIDE 0.9 % IV SOLN: INTRAVENOUS | Status: DC

## 2023-08-08 MED ORDER — TRAMADOL HCL 50 MG PO TABS
50.0000 mg | ORAL_TABLET | Freq: Four times a day (QID) | ORAL | Status: DC | PRN
  Administered 2023-08-14 – 2023-08-19 (×8): 50 mg via ORAL
  Filled 2023-08-08 (×8): qty 1

## 2023-08-08 MED ORDER — ZOLPIDEM TARTRATE 5 MG PO TABS: 5.0000 mg | ORAL_TABLET | Freq: Every evening | ORAL | Status: DC | PRN

## 2023-08-08 MED ORDER — POTASSIUM CHLORIDE CRYS ER 20 MEQ PO TBCR
20.0000 meq | EXTENDED_RELEASE_TABLET | Freq: Two times a day (BID) | ORAL | Status: DC
  Administered 2023-08-08 (×2): 20 meq via ORAL
  Filled 2023-08-08 (×2): qty 1

## 2023-08-08 MED ORDER — IMMUNE GLOBULIN (HUMAN) 10 GM/100ML IV SOLN
400.0000 mg/kg | INTRAVENOUS | Status: AC
  Administered 2023-08-08 – 2023-08-12 (×5): 40 g via INTRAVENOUS
  Filled 2023-08-08 (×5): qty 400

## 2023-08-08 MED ORDER — SODIUM CHLORIDE 0.9% FLUSH
3.0000 mL | Freq: Two times a day (BID) | INTRAVENOUS | Status: DC
  Administered 2023-08-09 – 2023-08-19 (×21): 3 mL via INTRAVENOUS

## 2023-08-08 MED ORDER — DULOXETINE HCL 60 MG PO CPEP
60.0000 mg | ORAL_CAPSULE | Freq: Every day | ORAL | Status: DC
  Administered 2023-08-09 – 2023-08-19 (×10): 60 mg via ORAL
  Filled 2023-08-08 (×10): qty 1

## 2023-08-08 MED ORDER — LEVALBUTEROL HCL 0.63 MG/3ML IN NEBU: 0.6300 mg | INHALATION_SOLUTION | Freq: Four times a day (QID) | RESPIRATORY_TRACT | Status: DC | PRN

## 2023-08-08 MED ORDER — MAGNESIUM SULFATE 2 GM/50ML IV SOLN
2.0000 g | Freq: Once | INTRAVENOUS | Status: AC
  Administered 2023-08-08: 2 g via INTRAVENOUS
  Filled 2023-08-08: qty 50

## 2023-08-08 MED ORDER — HYDROMORPHONE HCL 1 MG/ML IJ SOLN
0.5000 mg | INTRAMUSCULAR | Status: DC | PRN
  Administered 2023-08-12 – 2023-08-13 (×3): 1 mg via INTRAVENOUS
  Filled 2023-08-08 (×3): qty 1

## 2023-08-08 MED ORDER — LORAZEPAM 1 MG PO TABS
1.0000 mg | ORAL_TABLET | ORAL | Status: AC | PRN
  Administered 2023-08-09: 1 mg via ORAL
  Filled 2023-08-08: qty 1

## 2023-08-08 MED ORDER — HYDRALAZINE HCL 20 MG/ML IJ SOLN
10.0000 mg | INTRAMUSCULAR | Status: DC | PRN
  Administered 2023-08-11 – 2023-08-12 (×2): 10 mg via INTRAVENOUS
  Filled 2023-08-08 (×2): qty 1

## 2023-08-08 MED ORDER — FEBUXOSTAT 40 MG PO TABS
40.0000 mg | ORAL_TABLET | Freq: Every day | ORAL | Status: DC
  Filled 2023-08-08 (×4): qty 1

## 2023-08-08 MED ORDER — ACETAMINOPHEN 325 MG PO TABS: 650.0000 mg | ORAL_TABLET | Freq: Four times a day (QID) | ORAL | Status: DC | PRN

## 2023-08-08 MED ORDER — OXYCODONE HCL 5 MG PO TABS: 5.0000 mg | ORAL_TABLET | ORAL | Status: DC | PRN

## 2023-08-08 MED ORDER — SENNOSIDES-DOCUSATE SODIUM 8.6-50 MG PO TABS: 1.0000 | ORAL_TABLET | Freq: Every evening | ORAL | Status: DC | PRN

## 2023-08-08 MED ORDER — ONDANSETRON HCL 4 MG PO TABS: 4.0000 mg | ORAL_TABLET | Freq: Four times a day (QID) | ORAL | Status: DC | PRN

## 2023-08-08 MED ORDER — INSULIN ASPART 100 UNIT/ML IJ SOLN
0.0000 [IU] | Freq: Three times a day (TID) | INTRAMUSCULAR | Status: DC
  Administered 2023-08-08: 6 [IU] via SUBCUTANEOUS
  Administered 2023-08-09: 3 [IU] via SUBCUTANEOUS
  Administered 2023-08-09: 2 [IU] via SUBCUTANEOUS
  Administered 2023-08-09 – 2023-08-17 (×8): 1 [IU] via SUBCUTANEOUS
  Administered 2023-08-18: 2 [IU] via SUBCUTANEOUS
  Filled 2023-08-08 (×2): qty 1

## 2023-08-08 MED ORDER — LAMOTRIGINE 25 MG PO TABS: 100.0000 mg | ORAL_TABLET | Freq: Two times a day (BID) | ORAL | Status: DC

## 2023-08-08 MED ORDER — OXCARBAZEPINE 300 MG PO TABS
300.0000 mg | ORAL_TABLET | Freq: Every day | ORAL | Status: DC
  Administered 2023-08-09 – 2023-08-19 (×10): 300 mg via ORAL
  Filled 2023-08-08 (×13): qty 1

## 2023-08-08 MED ORDER — ACETAMINOPHEN 650 MG RE SUPP: 650.0000 mg | Freq: Four times a day (QID) | RECTAL | Status: DC | PRN

## 2023-08-08 MED ORDER — FLEET ENEMA RE ENEM: 1.0000 | ENEMA | Freq: Once | RECTAL | Status: DC | PRN

## 2023-08-08 MED ORDER — NORTRIPTYLINE HCL 25 MG PO CAPS
50.0000 mg | ORAL_CAPSULE | Freq: Every day | ORAL | Status: DC
  Administered 2023-08-08 – 2023-08-18 (×9): 50 mg via ORAL
  Filled 2023-08-08 (×15): qty 2

## 2023-08-08 MED ORDER — THIAMINE MONONITRATE 100 MG PO TABS
100.0000 mg | ORAL_TABLET | Freq: Every day | ORAL | Status: DC
  Administered 2023-08-09: 100 mg via ORAL
  Filled 2023-08-08 (×2): qty 1

## 2023-08-08 MED ORDER — IMMUNE GLOBULIN (HUMAN) 10 GM/100ML IV SOLN: 1.0000 g/kg | INTRAVENOUS | Status: DC

## 2023-08-08 MED ORDER — FEBUXOSTAT 40 MG PO TABS
40.0000 mg | ORAL_TABLET | Freq: Every day | ORAL | Status: DC
  Administered 2023-08-09 – 2023-08-19 (×10): 40 mg via ORAL
  Filled 2023-08-08 (×13): qty 1

## 2023-08-08 MED ORDER — BISACODYL 5 MG PO TBEC: 5.0000 mg | DELAYED_RELEASE_TABLET | Freq: Every day | ORAL | Status: DC | PRN

## 2023-08-08 MED ORDER — FOLIC ACID 1 MG PO TABS
1.0000 mg | ORAL_TABLET | Freq: Every day | ORAL | Status: DC
  Administered 2023-08-08 – 2023-08-19 (×11): 1 mg via ORAL
  Filled 2023-08-08 (×11): qty 1

## 2023-08-08 MED ORDER — OXYCODONE-ACETAMINOPHEN 5-325 MG PO TABS
1.0000 | ORAL_TABLET | Freq: Once | ORAL | Status: AC
  Administered 2023-08-08: 1 via ORAL
  Filled 2023-08-08: qty 1

## 2023-08-08 MED ORDER — FUROSEMIDE 40 MG PO TABS: 40.0000 mg | ORAL_TABLET | Freq: Every day | ORAL | Status: DC

## 2023-08-08 MED ORDER — PREDNISONE 20 MG PO TABS: 40.0000 mg | ORAL_TABLET | Freq: Two times a day (BID) | ORAL | Status: DC

## 2023-08-08 MED ORDER — THIAMINE HCL 100 MG/ML IJ SOLN
100.0000 mg | Freq: Every day | INTRAMUSCULAR | Status: DC
  Administered 2023-08-08: 100 mg via INTRAVENOUS
  Filled 2023-08-08: qty 2

## 2023-08-08 MED ORDER — NICOTINE 21 MG/24HR TD PT24
21.0000 mg | MEDICATED_PATCH | Freq: Every day | TRANSDERMAL | Status: DC
  Administered 2023-08-08 – 2023-08-19 (×12): 21 mg via TRANSDERMAL
  Filled 2023-08-08 (×12): qty 1

## 2023-08-08 MED ORDER — ADULT MULTIVITAMIN W/MINERALS CH
1.0000 | ORAL_TABLET | Freq: Every day | ORAL | Status: DC
  Administered 2023-08-08 – 2023-08-19 (×11): 1 via ORAL
  Filled 2023-08-08 (×11): qty 1

## 2023-08-08 MED ORDER — POTASSIUM CHLORIDE CRYS ER 20 MEQ PO TBCR
40.0000 meq | EXTENDED_RELEASE_TABLET | Freq: Once | ORAL | Status: AC
  Administered 2023-08-08: 40 meq via ORAL
  Filled 2023-08-08: qty 2

## 2023-08-08 MED ORDER — PANTOPRAZOLE SODIUM 40 MG PO TBEC
40.0000 mg | DELAYED_RELEASE_TABLET | Freq: Every day | ORAL | Status: DC
  Administered 2023-08-08 – 2023-08-09 (×2): 40 mg via ORAL
  Filled 2023-08-08 (×2): qty 1

## 2023-08-08 NOTE — ED Provider Notes (Signed)
 [place] EMERGENCY DEPARTMENT AT [facility] Provider Note   CSN: [id number] Arrival date & time: 08/08/23  1123     History  Chief Complaint  Patient presents with   Alcohol  Intoxication    Connor Copeland is a 48 y.o. male with PMHx chronic inflammatory demyelinating polyneuropathy, ETOH use disorder, gout who presents to ED concerned that he is not able to take care of himself. Patient and family member at bedside stating that he is having increased falls recently d/t the neuropathy. Patient was going to physical therapy, but can no longer drive so he cannot participate in this anymore. Patient denies any infectious symptoms today. Denies pain. Denies head trauma, LOC recent seizures, blood thinners.    Alcohol  Intoxication       Home Medications Prior to Admission medications   Medication Sig Start Date End Date Taking? Authorizing Provider  DULoxetine  (CYMBALTA ) 60 MG capsule Take 1 capsule (60 mg total) by mouth daily. 03/19/23  Yes Onita Duos, MD  fluticasone (FLONASE) 50 MCG/ACT nasal spray Place 1 spray into both nostrils daily. 07/15/23  Yes [provider]  HYDROcodone -acetaminophen  (NORCO) 7.5-325 MG tablet Take 1 tablet by mouth 3 (three) times daily as needed. 08/01/23  Yes [provider]  nicotine  (NICODERM CQ  - DOSED IN MG/24 HOURS) 21 mg/24hr patch Place 1 patch (21 mg total) onto the skin daily. 02/04/23  Yes Tat, Alm, MD  nortriptyline  (PAMELOR ) 25 MG capsule Take 2 capsules (50 mg total) by mouth at bedtime. 03/19/23  Yes Onita Duos, MD  Oxcarbazepine  (TRILEPTAL ) 300 MG tablet one po qAM and two po qHS 08/04/23  Yes Sater, Charlie LABOR, MD  predniSONE  (DELTASONE ) 20 MG tablet Take 4 pills (80 mg) p.o. daily for 6 weeks.  Then take 3 pills p.o. daily x 2 weeks.  Then take 2 pills p.o. daily x 2 weeks.  Then take 1 pill a day until the follow-up visit with your neurologist 04/16/23  Yes Sater, Charlie LABOR, MD  traMADol  (ULTRAM ) 50 MG  tablet Take 1 tablet (50 mg total) by mouth in the morning, at noon, in the evening, and at bedtime. 05/06/23  Yes Sater, Charlie LABOR, MD  ULORIC  80 MG TABS Take 40 mg by mouth daily. 04/02/23  Yes [provider]      Allergies    Gabapentin    Review of Systems   Review of Systems  Neurological:  Positive for weakness.    Physical Exam Updated Vital Signs BP (!) 106/58 (BP Location: Right Arm)   Pulse (!) 109   Temp 98.9 F (37.2 C) (Temporal)   Resp 17   SpO2 93%  Physical Exam Vitals and nursing note reviewed.  Constitutional:      General: He is not in acute distress. HENT:     Head: Normocephalic and atraumatic.     Mouth/Throat:     Mouth: Mucous membranes are moist.  Eyes:     General: No scleral icterus.       Right eye: No discharge.        Left eye: No discharge.     Conjunctiva/sclera: Conjunctivae normal.  Cardiovascular:     Rate and Rhythm: Normal rate and regular rhythm.     Pulses: Normal pulses.     Heart sounds: Normal heart sounds. No murmur heard. Pulmonary:     Effort: Pulmonary effort is normal. No respiratory distress.     Breath sounds: Normal breath sounds. No wheezing, rhonchi or rales.  Abdominal:     General: Abdomen is flat. Bowel sounds are normal. There is no distension.     Palpations: Abdomen is soft. There is no mass.     Tenderness: There is no abdominal tenderness.  Musculoskeletal:     Right lower leg: No edema.     Left lower leg: No edema.  Skin:    General: Skin is warm and dry.     Findings: No rash.  Neurological:     General: No focal deficit present.     Mental Status: He is alert and oriented to person, place, and time. Mental status is at baseline.     Comments: GCS 15. Speech is goal oriented. No deficits appreciated to CN III-XII; symmetric eyebrow raise, no facial drooping, tongue midline. Patient has equal grip strength bilaterally with 5/5 strength against resistance in all major muscle groups bilaterally.  Sensation to light touch intact. Patient moves extremities without ataxia.    Psychiatric:        Mood and Affect: Mood normal.        Behavior: Behavior normal.     ED Results / Procedures / Treatments   Labs (all labs ordered are listed, but only abnormal results are displayed) Labs Reviewed  CBC WITH DIFFERENTIAL/PLATELET - Abnormal; Notable for the following components:      Result Value   HCT 37.8 (*)    nRBC 1.4 (*)    Abs Immature Granulocytes 0.39 (*)    All other components within normal limits  BASIC METABOLIC PANEL - Abnormal; Notable for the following components:   Sodium 125 (*)    Potassium 2.6 (*)    Chloride 78 (*)    Glucose, Bld 559 (*)    Anion gap 21 (*)    All other components within normal limits  URINALYSIS, ROUTINE W REFLEX MICROSCOPIC - Abnormal; Notable for the following components:   Glucose, UA >=500 (*)    Ketones, ur 5 (*)    All other components within normal limits  RAPID URINE DRUG SCREEN, HOSP PERFORMED - Abnormal; Notable for the following components:   Opiates POSITIVE (*)    Tetrahydrocannabinol POSITIVE (*)    All other components within normal limits  VITAMIN B12 - Abnormal; Notable for the following components:   Vitamin B-12 1,155 (*)    All other components within normal limits  FOLATE - Abnormal; Notable for the following components:   Folate 4.9 (*)    All other components within normal limits  SODIUM - Abnormal; Notable for the following components:   Sodium 127 (*)    All other components within normal limits  CBG MONITORING, ED - Abnormal; Notable for the following components:   Glucose-Capillary 407 (*)    All other components within normal limits  EXPECTORATED SPUTUM ASSESSMENT W GRAM STAIN, RFLX TO RESP C  MAGNESIUM   PHOSPHORUS  PROTIME-INR  HEMOGLOBIN A1C  TSH  NA AND K (SODIUM & POTASSIUM), RAND UR  OSMOLALITY, URINE  OSMOLALITY  BASIC METABOLIC PANEL  CBC    EKG EKG Interpretation Date/Time:  Friday  August 08 2023 11:33:45 EST Ventricular Rate:  106 PR Interval:  161 QRS Duration:  113 QT Interval:  382 QTC Calculation: 508 R Axis:   240  Text Interpretation: Sinus tachycardia Atrial premature complex Probable left atrial enlargement Abnormal R-wave progression, late transition Probable inferior infarct, old Abnrm T, consider ischemia, anterolateral lds Prolonged QT interval Baseline wander in lead(s) V1 No significant change since prior 6/24 Confirmed by  Towana Sharper [phone]) on 08/08/2023 11:43:45 AM  Radiology No results found.  Procedures .Critical Care  Performed by: Hoy Nidia FALCON, PA-C Authorized by: Hoy Nidia FALCON, PA-C   Critical care provider statement:    Critical care time (minutes):  30   Critical care was necessary to treat or prevent imminent or life-threatening deterioration of the following conditions:  Metabolic crisis   Critical care was time spent personally by me on the following activities:  Development of treatment plan with patient or surrogate, discussions with consultants, evaluation of patient's response to treatment, examination of patient, ordering and review of laboratory studies, ordering and review of radiographic studies, ordering and performing treatments and interventions, pulse oximetry, re-evaluation of patient's condition and review of old charts   Care discussed with: admitting provider   Comments:     Critically low potassium      Medications Ordered in ED Medications  potassium chloride  10 mEq in 100 mL IVPB (10 mEq Intravenous New Bag/Given 08/08/23 1732)  magnesium  sulfate IVPB 2 g 50 mL (2 g Intravenous New Bag/Given 08/08/23 1733)  heparin  injection 5,000 Units (has no administration in time range)  sodium chloride  flush (NS) 0.9 % injection 3 mL (has no administration in time range)  acetaminophen  (TYLENOL ) tablet 650 mg (has no administration in time range)    Or  acetaminophen  (TYLENOL ) suppository 650 mg (has no  administration in time range)  HYDROmorphone  (DILAUDID ) injection 0.5-1 mg (has no administration in time range)  zolpidem  (AMBIEN ) tablet 5 mg (has no administration in time range)  senna-docusate (Senokot-S) tablet 1 tablet (has no administration in time range)  bisacodyl  (DULCOLAX) EC tablet 5 mg (has no administration in time range)  sodium phosphate (FLEET) enema 1 enema (has no administration in time range)  ondansetron  (ZOFRAN ) tablet 4 mg (has no administration in time range)    Or  ondansetron  (ZOFRAN ) injection 4 mg (has no administration in time range)  ipratropium (ATROVENT ) nebulizer solution 0.5 mg (has no administration in time range)  levalbuterol  (XOPENEX ) nebulizer solution 0.63 mg (has no administration in time range)  hydrALAZINE  (APRESOLINE ) injection 10 mg (has no administration in time range)  insulin  aspart (novoLOG ) injection 0-6 Units (has no administration in time range)  HYDROcodone -acetaminophen  (NORCO) 7.5-325 MG per tablet 1 tablet (has no administration in time range)  traMADol  (ULTRAM ) tablet 50 mg (has no administration in time range)  furosemide  (LASIX ) tablet 40 mg (has no administration in time range)  DULoxetine  (CYMBALTA ) DR capsule 60 mg (has no administration in time range)  nicotine  (NICODERM CQ  - dosed in mg/24 hours) patch 21 mg (21 mg Transdermal Patch Applied 08/08/23 1528)  nortriptyline  (PAMELOR ) capsule 50 mg (has no administration in time range)  potassium chloride  SA (KLOR-CON  M) CR tablet 20 mEq (20 mEq Oral Given 08/08/23 1527)  Oxcarbazepine  (TRILEPTAL ) tablet 300 mg (has no administration in time range)  0.9 %  sodium chloride  infusion (has no administration in time range)  predniSONE  (DELTASONE ) tablet 40 mg (has no administration in time range)  pantoprazole  (PROTONIX ) EC tablet 40 mg (40 mg Oral Given 08/08/23 1716)  LORazepam  (ATIVAN ) tablet 1-4 mg (has no administration in time range)    Or  LORazepam  (ATIVAN ) injection 1-4 mg (has no  administration in time range)  thiamine  (VITAMIN B1) tablet 100 mg ( Oral See Alternative 08/08/23 1713)    Or  thiamine  (VITAMIN B1) injection 100 mg (100 mg Intravenous Given 08/08/23 1713)  folic acid  (FOLVITE ) tablet 1 mg (1  mg Oral Given 08/08/23 1716)  multivitamin with minerals tablet 1 tablet (1 tablet Oral Given 08/08/23 1716)  Immune Globulin  10% (PRIVIGEN ) IV infusion 40 g (has no administration in time range)  Oxcarbazepine  (TRILEPTAL ) tablet 600 mg (has no administration in time range)  febuxostat  (ULORIC ) tablet 40 mg (has no administration in time range)  potassium chloride  SA (KLOR-CON  M) CR tablet 40 mEq (40 mEq Oral Given 08/08/23 1527)  oxyCODONE -acetaminophen  (PERCOCET/ROXICET) 5-325 MG per tablet 1 tablet (1 tablet Oral Given 08/08/23 1528)    ED Course/ Medical Decision Making/ A&P Clinical Course as of 08/08/23 1759  Fri Aug 08, 2023  [id number] year old male with history of neuropathy following with [facility] neurology here with increased weakness and falls.  He is currently on steroids and has been for a bit.  Blood sugar critically elevated and potassium significantly low which are likely contributing to his chronic weakness.  He would benefit from mission to the hospital for repletion of electrolytes and stabilization of his glucose. [MB]    Clinical Course User Index [MB] Towana Ozell BROCKS, MD                                 Medical Decision Making Amount and/or Complexity of Data Reviewed Labs: ordered.  Risk Prescription drug management.   This patient presents to the ED after a fall, this involves an extensive number of treatment options, and is a complaint that carries with it a high risk of complications and morbidity.  The differential diagnosis includes  intracranial hemorrhage, subdural/epidural hematoma, vertebral fracture, spinal cord injury, muscle strain, skull fracture, fracture.   Co morbidities that complicate the patient evaluation  chronic inflammatory  demyelinating polyneuropathy, ETOH use disorder, gout    Additional history obtained:  Additional history obtained from 08/04/2023 neurology note: we are currently tapering this off and will begin IVIG.   Consultations Obtained:  I requested consultation with the D. 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 Patient with stable vitals and does not appear to be in distress. Physical/neuro exam reassuring. Patient denying any pains or head trauma/seizures/LOC. Patient not on blood thinners.  I Ordered, and personally interpreted labs.  CBC without leukocytosis or anemia.  UA without concern for infection.  UDS positive for opiates and cannabis.  BMP with hypokalemia 2.6, hyponatremia 125, and hyperglycemia at 559.  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  Started patient on oral and IV potassium supplementation.  Admitting patient for these lab critical lab values. The patient was maintained on a cardiac monitor.  I personally viewed and interpreted the cardiac monitored which showed an underlying rhythm of: Sinus tachycardia Patient had an unremarkable physical exam and a score of 0 for the Nexus C-spine and Canadian head CT score and so head imaging was not obtained at this time. Dr. Willette admitting provider I have reviewed the patients home medicines and have made adjustments as needed   Social Determinants of Health:  none           Final Clinical Impression(s) / ED Diagnoses Final diagnoses:  CIDP (chronic inflammatory demyelinating polyneuropathy) (HCC)    Rx /  DC Orders ED Discharge Orders     None         Hoy Nidia FALCON, [place] 08/08/23 1759     Towana Ozell BROCKS, MD 08/09/23 [phone]

## 2023-08-08 NOTE — Assessment & Plan Note (Addendum)
-   Continue home medication as needed colchicine

## 2023-08-08 NOTE — Assessment & Plan Note (Signed)
 Not any medications

## 2023-08-08 NOTE — ED Notes (Signed)
 AC to bring nortriptyline from main pharmacy

## 2023-08-08 NOTE — Assessment & Plan Note (Signed)
-   Positive for withdrawal -Will initiate CIWA protocol

## 2023-08-08 NOTE — ED Notes (Signed)
 ED TO INPATIENT HANDOFF REPORT  ED Nurse Name and Phone #: Vicenta Valery Copeland Name/Age/Gender Connor Copeland Dickinson 48 y.o. male Room/Bed: APA15/APA15  Code Status   Code Status: Full Code  Home/SNF/Other Home Patient oriented to: self, time, and situation Is this baseline? No   Triage Complete: Triage complete  Chief Complaint Hypoglycemia [E16.2]  Triage Note Patient was brought by [facility] from home for being intoxicated this past week and difficulty caring for himself at home according to EMS conversation with the family.   Allergies Allergies  Allergen Reactions   Gabapentin Swelling    Swelling off the legs    Level of Care/Admitting Diagnosis ED Disposition     ED Disposition  Admit   Condition  --   Comment  Hospital Area: [facility] [[id number]]  Level of Care: Med-Surg [16]  Covid Evaluation: Asymptomatic - no recent exposure (last 10 days) testing not required  Diagnosis: Hypoglycemia [757795]  Admitting Physician: WILLETTE ADRIANA DELENA ALPHA  Attending Physician: WILLETTE ADRIANA DELENA [phone]  Certification:: I certify this patient will need inpatient services for at least 2 midnights  Expected Medical Readiness: 08/11/2023          B Medical/Surgery History Past Medical History:  Diagnosis Date   ETOH abuse    Gout    Past Surgical History:  Procedure Laterality Date   FOOT SURGERY       A IV Location/Drains/Wounds Patient Lines/Drains/Airways Status     Active Line/Drains/Airways     Name Placement date Placement time Site Days   Wound / Incision (Open or Dehisced) 01/28/23 Skin tear Elbow Posterior;Right scab from patient's fall, pulled off and became what looks like skin tear 01/28/23  1600  Elbow  192            Intake/Output Last 24 hours  Intake/Output Summary (Last 24 hours) at 08/08/2023 1453 Last data filed at 08/08/2023 1218 Gross per 24 hour  Intake --  Output 800 ml  Net -800 ml     Labs/Imaging Results for orders placed or performed during the hospital encounter of 08/08/23 (from the past 48 hours)  Urinalysis, Routine w reflex microscopic -Urine, Clean Catch     Status: Abnormal   Collection Time: 08/08/23 12:19 PM  Result Value Ref Range   Color, Urine YELLOW YELLOW   APPearance CLEAR CLEAR   Specific Gravity, Urine 1.026 1.005 - 1.030   pH 6.0 5.0 - 8.0   Glucose, UA >=500 (A) NEGATIVE mg/dL   Hgb urine dipstick NEGATIVE NEGATIVE   Bilirubin Urine NEGATIVE NEGATIVE   Ketones, ur 5 (A) NEGATIVE mg/dL   Protein, ur NEGATIVE NEGATIVE mg/dL   Nitrite NEGATIVE NEGATIVE   Leukocytes,Ua NEGATIVE NEGATIVE   RBC / HPF 0-5 0 - 5 RBC/hpf   WBC, UA 0-5 0 - 5 WBC/hpf   Bacteria, UA NONE SEEN NONE SEEN   Squamous Epithelial / HPF 0-5 0 - 5 /HPF   Mucus PRESENT    Hyaline Casts, UA PRESENT     Comment: Performed at [facility], [address] [place], [place] [address]  Rapid urine drug screen (hospital performed)     Status: Abnormal   Collection Time: 08/08/23 12:19 PM  Result Value Ref Range   Opiates POSITIVE (A) NONE DETECTED   Cocaine NONE DETECTED NONE DETECTED   Benzodiazepines NONE DETECTED NONE DETECTED   Amphetamines NONE DETECTED NONE DETECTED   Tetrahydrocannabinol POSITIVE (A) NONE DETECTED   Barbiturates NONE DETECTED NONE DETECTED  Comment: (NOTE) DRUG SCREEN FOR MEDICAL PURPOSES ONLY.  IF CONFIRMATION IS NEEDED FOR ANY PURPOSE, NOTIFY LAB WITHIN 5 DAYS.  LOWEST DETECTABLE LIMITS FOR URINE DRUG SCREEN Drug Class                     Cutoff (ng/mL) Amphetamine and metabolites    1000 Barbiturate and metabolites    200 Benzodiazepine                 200 Opiates and metabolites        300 Cocaine and metabolites        300 THC                            50 Performed at [facility], [address], [place], [place] [address]   CBC with Differential     Status: Abnormal   Collection Time: 08/08/23 12:39 PM  Result Value Ref Range   WBC  10.2 4.0 - 10.5 K/uL   RBC 4.29 4.22 - 5.81 MIL/uL   Hemoglobin 13.6 13.0 - 17.0 g/dL   HCT 62.1 (L) 60.9 - 47.9 %   MCV 88.1 80.0 - 100.0 fL   MCH 31.7 26.0 - 34.0 pg   MCHC 36.0 30.0 - 36.0 g/dL   RDW 85.8 88.4 - 84.4 %   Platelets 386 150 - 400 K/uL   nRBC 1.4 (H) 0.0 - 0.2 %   Neutrophils Relative % 58 %   Neutro Abs 5.9 1.7 - 7.7 K/uL   Lymphocytes Relative 31 %   Lymphs Abs 3.2 0.7 - 4.0 K/uL   Monocytes Relative 6 %   Monocytes Absolute 0.7 0.1 - 1.0 K/uL   Eosinophils Relative 0 %   Eosinophils Absolute 0.0 0.0 - 0.5 K/uL   Basophils Relative 1 %   Basophils Absolute 0.1 0.0 - 0.1 K/uL   Immature Granulocytes 4 %   Abs Immature Granulocytes 0.39 (H) 0.00 - 0.07 K/uL    Comment: Performed at [facility], [address], [place], [place] [address]  Basic metabolic panel     Status: Abnormal   Collection Time: 08/08/23 12:39 PM  Result Value Ref Range   Sodium 125 (L) 135 - 145 mmol/L   Potassium 2.6 (LL) 3.5 - 5.1 mmol/L    Comment: CRITICAL RESULT CALLED TO, READ BACK BY AND VERIFIED WITH FOWLER @ 1412 ON [id number] BY HENDERSON L   Chloride 78 (L) 98 - 111 mmol/L   CO2 26 22 - 32 mmol/L   Glucose, Bld 559 (HH) 70 - 99 mg/dL    Comment: CRITICAL RESULT CALLED TO, READ BACK BY AND VERIFIED WITH FOWLER @ 1413 ON [id number] BY HENDERSON L Glucose reference range applies only to samples taken after fasting for at least 8 hours.    BUN 10 6 - 20 mg/dL   Creatinine, Ser 9.17 0.61 - 1.24 mg/dL   Calcium  9.2 8.9 - 10.3 mg/dL   GFR, Estimated >39 >39 mL/min    Comment: (NOTE) Calculated using the CKD-EPI Creatinine Equation (2021)    Anion gap 21 (H) 5 - 15    Comment: ELECTROLYTES REPEATED TO VERIFY Performed at [facility], [address] [place], [place] [address]    No results found.  Pending Labs Unresulted Labs (From admission, onward)     Start     Ordered   08/09/23 0500  Basic metabolic panel  Daily,   R  08/08/23 1441   08/09/23 0500  CBC  Daily,   R       08/08/23 1441   08/08/23 1453  Na and K (sodium & potassium), rand urine  Add-on,   R        08/08/23 1453   08/08/23 1453  Osmolality  Once,   R        08/08/23 1453   08/08/23 1453  Sodium  Once,   R        08/08/23 1453   08/08/23 1452  Osmolality, urine  Once,   R        08/08/23 1453   08/08/23 1444  TSH  Add-on,   AD        08/08/23 1443   08/08/23 1443  Folate  Once,   R        08/08/23 1443   08/08/23 1442  Hemoglobin A1c  Once,   R       Comments: To assess prior glycemic control    08/08/23 1441   08/08/23 1442  Hemoglobin A1c  Once,   R       Comments: To assess prior glycemic control    08/08/23 1441   08/08/23 1442  Vitamin B12  Add-on,   AD        08/08/23 1443   08/08/23 1436  CBC  (heparin )  Once,   R       Comments: Baseline for heparin  therapy IF NOT ALREADY DRAWN.  Notify MD if PLT < 100 K.    08/08/23 1441   08/08/23 1436  Phosphorus  Add-on,   AD        08/08/23 1441   08/08/23 1436  Protime-INR  Add-on,   AD        08/08/23 1441   08/08/23 1436  Expectorated Sputum Assessment w Gram Stain, Rflx to Resp Cult  Once,   R       Comments: If productive cough    08/08/23 1441   08/08/23 1435  Magnesium   Add-on,   AD        08/08/23 1434            Vitals/Pain Today's Vitals   08/08/23 1153 08/08/23 1204  BP: (!) 159/147   Pulse: (!) 109   Resp: 18   Temp: 98.9 F (37.2 C)   TempSrc: Oral   SpO2: 94%   PainSc:  10-Worst pain ever    Isolation Precautions No active isolations  Medications Medications  potassium chloride  SA (KLOR-CON  M) CR tablet 40 mEq (has no administration in time range)  potassium chloride  10 mEq in 100 mL IVPB (has no administration in time range)  oxyCODONE -acetaminophen  (PERCOCET/ROXICET) 5-325 MG per tablet 1 tablet (has no administration in time range)  magnesium  sulfate IVPB 2 g 50 mL (has no administration in time range)  heparin  injection 5,000 Units (has no administration in time range)  sodium chloride   flush (NS) 0.9 % injection 3 mL (has no administration in time range)  acetaminophen  (TYLENOL ) tablet 650 mg (has no administration in time range)    Or  acetaminophen  (TYLENOL ) suppository 650 mg (has no administration in time range)  HYDROmorphone  (DILAUDID ) injection 0.5-1 mg (has no administration in time range)  zolpidem  (AMBIEN ) tablet 5 mg (has no administration in time range)  senna-docusate (Senokot-S) tablet 1 tablet (has no administration in time range)  bisacodyl  (DULCOLAX) EC tablet 5 mg (has no administration in time range)  sodium phosphate (FLEET) enema  1 enema (has no administration in time range)  ondansetron  (ZOFRAN ) tablet 4 mg (has no administration in time range)    Or  ondansetron  (ZOFRAN ) injection 4 mg (has no administration in time range)  ipratropium (ATROVENT ) nebulizer solution 0.5 mg (has no administration in time range)  levalbuterol  (XOPENEX ) nebulizer solution 0.63 mg (has no administration in time range)  hydrALAZINE  (APRESOLINE ) injection 10 mg (has no administration in time range)  insulin  aspart (novoLOG ) injection 0-6 Units (has no administration in time range)  0.9 %  sodium chloride  infusion (has no administration in time range)  HYDROcodone -acetaminophen  (NORCO) 7.5-325 MG per tablet 1 tablet (has no administration in time range)  traMADol  (ULTRAM ) tablet 50 mg (has no administration in time range)  Febuxostat  TABS 40 mg (has no administration in time range)  furosemide  (LASIX ) tablet 40 mg (has no administration in time range)  DULoxetine  (CYMBALTA ) DR capsule 60 mg (has no administration in time range)  nicotine  (NICODERM CQ  - dosed in mg/24 hours) patch 21 mg (has no administration in time range)  nortriptyline  (PAMELOR ) capsule 50 mg (has no administration in time range)  potassium chloride  SA (KLOR-CON  M) CR tablet 20 mEq (has no administration in time range)  lamoTRIgine  (LAMICTAL ) tablet 100 mg (has no administration in time range)     Mobility walks with person assist        R Recommendations: See Admitting Provider Note  Report given to:

## 2023-08-08 NOTE — Consult Note (Signed)
 I connected with  Connor Copeland on 08/08/23 by a video enabled telemedicine application and verified that I am speaking with the correct person using two identifiers.   I discussed the limitations of evaluation and management by telemedicine. The patient expressed understanding and agreed to proceed.  Location of patient: [facility] Location of physician: [facility]   Neurology Consultation Reason for Consult: CIDP Referring Physician: Nidia Mays, PA  CC: weakness  History is obtained from: patient, chart review  HPI: Connor Copeland is a 48 y.o. 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 Patient was diagnosed with CIDP and tried steroids which didn't help. Per Dr Alwyn note, plan was to start IVIG, Patient states his weakness and numbness has been worsening and he had a fall while going to the bathroom yesterday evening and again today.  ROS: All other systems reviewed and negative except as noted in the HPI.   Past Medical History:  Diagnosis Date   ETOH abuse    Gout     Family History  Problem Relation Age of Onset   COPD Father     Social History:  reports that he has been smoking cigarettes. He has never used smokeless tobacco. He reports current alcohol  use of about 42.0 standard drinks of alcohol  per week. He reports that he does not use drugs.  Exam: Current vital signs: BP (!) 106/58 (BP Location: Right Arm)   Pulse (!) 109   Temp 98.9 F (37.2 C) (Temporal)   Resp 17   SpO2 93%  Vital signs in last 24 hours: Temp:  [98.9 F (37.2 C)] 98.9 F (37.2 C) (01/03 1500) Pulse Rate:  [109] 109 (01/03 1505) Resp:  [17-18] 17 (01/03 1505) BP: (106-159)/(58-147) 106/58 (01/03 1500) SpO2:  [93 %-94 %] 93 % (01/03 1505)   Physical Exam  Constitutional: Appears well-developed and well-nourished.  Neuro: Aox3, CN 2-12 grossly intact, anti-gravity strength in all extremities, decreased sensation to touch per patient report in BL LE and  UE, FTN intact  I have reviewed labs in epic and the results pertinent to this consultation are: CBC:  Recent Labs  Lab 08/08/23 1239  WBC 10.2  NEUTROABS 5.9  HGB 13.6  HCT 37.8*  MCV 88.1  PLT 386    Basic Metabolic Panel:  Lab Results  Component Value Date   NA 125 (L) 08/08/2023   NA 127 (L) 08/08/2023   K 2.6 (LL) 08/08/2023   CO2 26 08/08/2023   GLUCOSE 559 (HH) 08/08/2023   BUN 10 08/08/2023   CREATININE 0.82 08/08/2023   CALCIUM  9.2 08/08/2023   GFRNONAA >60 08/08/2023   Lipid Panel: No results found for: LDLCALC HgbA1c: No results found for: HGBA1C Urine Drug Screen:     Component Value Date/Time   LABOPIA POSITIVE (A) 08/08/2023 1219   COCAINSCRNUR NONE DETECTED 08/08/2023 1219   LABBENZ NONE DETECTED 08/08/2023 1219   AMPHETMU NONE DETECTED 08/08/2023 1219   THCU POSITIVE (A) 08/08/2023 1219   LABBARB NONE DETECTED 08/08/2023 1219    Alcohol  Level     Component Value Date/Time   ETH <10 01/28/2023 0940     I have reviewed the images obtained:  CTH wo contrast 01/28/2023: Normal study.   MR C and L spine wo contrast 02/06/2023:The spinal cord appears normal. There is no critical spinal stenosis. At C6-C7 there are degenerative changes combining to cause mild spinal stenosis and left greater than right foraminal narrowing.  Although there is no definite nerve  root compression the degenerative changes encroach upon the C7 nerve roots. Milder degenerative changes at the other cervical levels as detailed above do not lead to spinal stenosis or nerve root compression. The terminal spinal cord and the conus medullary medullaris appear normal. Degenerative changes at L3-L4, L4-L5 and L5-S1 are noted but do not lead to spinal stenosis or nerve root compression.    ASSESSMENT/PLAN: 48yo M with CIDP, worsening weakness and falls.  CIDP, worsening - continue prednisone  taper, on 40mg  currently, taper  by 10mg  every week to stop - start IVIG 400mg /kg for 5  days - Resume home meds for symptomatic management of neuropathy - PT/OT - Discussed plan with patient, mother and Dr Willette  Thank you for allowing us  to participate in the care of this patient. If you have any further questions, please contact  me or neurohospitalist.   Arlin Krebs [facility]

## 2023-08-08 NOTE — Assessment & Plan Note (Signed)
 No primary formal diagnosis, currently on Lamictal and Trileptal will be continued

## 2023-08-08 NOTE — Assessment & Plan Note (Signed)
 No history of diabetes mellitus type 2, likely steroid-induced -Will initiate gentle IV fluid hydration -Check CBG q. ACHS, SSI coverage Will check hemoglobin A1c

## 2023-08-08 NOTE — Assessment & Plan Note (Signed)
 Not on any statins -Will check fasting lipid panel

## 2023-08-08 NOTE — Assessment & Plan Note (Signed)
-   Continue stable-monitoring

## 2023-08-08 NOTE — Assessment & Plan Note (Signed)
 Hyponatremia likely due to acute hyperglycemia due to chronic steroid use -Will continue gentle IV fluid hydration normal saline -SIADH workup -Will monitor serum sodium closely

## 2023-08-08 NOTE — Assessment & Plan Note (Signed)
 Severe generalized weakness is due to chronic demyelinating radiculopathy/neuropathy -Currently he is deemed disabled -Consulting PT/OT for evaluation and recommendation

## 2023-08-08 NOTE — Assessment & Plan Note (Addendum)
-   Home medications Cymbalta, as needed oxycodone, Ultram, -Per neurology recommendation continue oxcarbazepine And Lamictal

## 2023-08-08 NOTE — H&P (Addendum)
 History and Physical   Patient: Connor Copeland                            PCP: [name], MD                    DOB: 06-30-1976            DOA: 08/08/2023 FMW:[id number]             DOS: 08/08/2023, 3:34 PM  [name], MD  Patient coming from:   HOME  I have personally reviewed patient's medical records, in electronic medical records, including:  [place] link, and care everywhere.    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   ED evaluation: Blood pressure (!) 159/147, pulse (!) 109, temperature 98.9 F (37.2 C), temperature source Oral, resp. rate 18, SpO2 94%.  LABs; sodium 125, potassium 2.6, chloride 78, serum glucose 559, anion gap 21, WBC 10.2, UA unremarkable with exception of greater than 500 glucose Urine drug screen positive for opioids, marijuana    Patient Denies having: Fever, Chills, Cough, SOB, Chest Pain, Abd pain, N/V/D, headache, dizziness, lightheadedness,  Dysuria, Joint pain, rash, open wounds     Review of Systems: As per HPI, otherwise 10 point review of systems were negative.   ----------------------------------------------------------------------------------------------------------------------  Allergies  Allergen Reactions   Gabapentin Swelling    Swelling off the legs    Home MEDs:  Prior to Admission medications   Medication Sig Start Date End Date Taking? Authorizing Provider  fluticasone (FLONASE) 50 MCG/ACT nasal spray Place 1 spray into both nostrils daily. 07/15/23  Yes [provider]  furosemide  (LASIX ) 40 MG tablet Take 40 mg by mouth daily. 06/12/23  Yes [provider]  lamoTRIgine  (LAMICTAL ) 100 MG tablet Take 100 mg by mouth 2 (two) times daily. 06/27/23  Yes [provider]  levocetirizine (XYZAL) 5 MG tablet Take 5 mg by mouth daily. 07/15/23  Yes [provider]  colchicine 0.6 MG tablet Take 0.6 mg by mouth 2 (two) times daily. 04/21/23   [provider]  DULoxetine  (CYMBALTA ) 60 MG capsule Take 1 capsule (60 mg total) by mouth daily. 03/19/23   Onita Duos, MD  HYDROcodone -acetaminophen  (NORCO) 7.5-325 MG tablet Take 1 tablet by mouth 3 (three) times daily as needed. 08/01/23   [provider]  LORazepam  (ATIVAN ) 0.5 MG tablet Take 0.5 mg by mouth daily. Patient not taking: Reported on 02/13/2023 12/27/22   [provider]  magnesium  oxide (MAG-OX) 400 (240 Mg) MG tablet Take 1 tablet (400 mg total) by mouth 2 (two) times daily. Patient not taking: Reported on 08/04/2023 02/03/23   Evonnie Lenis, MD  nicotine  (NICODERM CQ  - DOSED IN MG/24 HOURS) 21 mg/24hr patch Place 1 patch (21 mg total) onto the skin daily. Patient not taking: Reported on 08/04/2023 02/04/23   Evonnie Lenis, MD  nortriptyline  (PAMELOR ) 25 MG capsule Take 2 capsules (50 mg total) by mouth at bedtime. Patient not taking: Reported on 08/04/2023 03/19/23   Onita Duos, MD  Oxcarbazepine  (TRILEPTAL ) 300 MG tablet one po qAM and two po qHS 08/04/23   Sater, Charlie LABOR, MD  oxyCODONE -acetaminophen  (PERCOCET/ROXICET) 5-325 MG tablet Take 1 tablet by mouth every 6 (six) hours as needed for moderate pain. Patient not taking: Reported on 08/04/2023 02/03/23   Evonnie Lenis, MD  potassium chloride  SA (KLOR-CON  M) 20 MEQ tablet Take 20 mEq by mouth 2 (two) times daily.    [provider]  predniSONE  (DELTASONE ) 20 MG tablet Take 4 pills (80 mg) p.o. daily for 6 weeks.  Then take 3 pills p.o. daily x 2 weeks.  Then take 2 pills p.o. daily x 2 weeks.  Then  take 1 pill a day until the follow-up visit with your neurologist 04/16/23   Vear Charlie LABOR, MD  traMADol  (ULTRAM ) 50 MG tablet Take 1 tablet (50 mg total) by mouth in the morning, at noon, in the evening, and at bedtime. 05/06/23   Sater, Charlie LABOR, MD  ULORIC  80 MG TABS Take 40 mg by mouth daily. 04/02/23   [provider]    PRN MEDs: acetaminophen  **OR** acetaminophen , bisacodyl , hydrALAZINE , HYDROcodone -acetaminophen , HYDROmorphone  (DILAUDID ) injection, ipratropium, levalbuterol , ondansetron  **OR** ondansetron  (ZOFRAN ) IV, senna-docusate, sodium phosphate, traMADol , zolpidem   Past Medical History:  Diagnosis Date   ETOH abuse    Gout     Past Surgical History:  Procedure Laterality Date   FOOT SURGERY       reports that he has been smoking cigarettes. He has never used smokeless tobacco. He reports current alcohol  use of about 42.0 standard drinks of alcohol  per week. He reports that he does not use drugs.   Family History  Problem Relation Age of Onset   COPD Father     Physical Exam:   Vitals:   08/08/23 1153 08/08/23 1500 08/08/23 1505  BP: (!) 159/147 (!) 106/58   Pulse: (!) 109  (!) 109  Resp: 18  17  Temp: 98.9 F (37.2 C) 98.9 F (37.2 C)   TempSrc: Oral Temporal   SpO2: 94%  93%   Constitutional: NAD, calm, comfortable Eyes: PERRL, lids and conjunctivae normal ENMT: Mucous membranes are moist. Posterior pharynx clear of any exudate or lesions.Normal dentition.  Neck: normal, supple, no masses, no thyromegaly Respiratory: clear to auscultation bilaterally, no wheezing, no crackles. Normal respiratory effort. No accessory muscle use.  Cardiovascular: Regular rate and rhythm, no murmurs / rubs / gallops. No extremity edema. 2+ pedal pulses. No carotid bruits.  Abdomen: no tenderness, no masses palpated. No hepatosplenomegaly. Bowel sounds positive.  Musculoskeletal: no clubbing / cyanosis. No joint deformity upper and lower extremities. Good ROM, no  contractures. Normal muscle tone.  Neurologic: Severe bilateral neuropathy, unable to ambulate  CN II-XII grossly intact.  Generalized reduced sensation, generalized bilateral lower extremity reduced strength   Psychiatric: Normal judgment and insight. Alert and oriented x 3. Normal mood.  Skin: no rashes, lesions, ulcers. No induration          Labs on admission:    I have personally reviewed following labs and imaging studies  CBC: Recent Labs  Lab 08/08/23 1239  WBC 10.2  NEUTROABS 5.9  HGB 13.6  HCT 37.8*  MCV 88.1  PLT 386   Basic Metabolic Panel: Recent Labs  Lab 08/08/23 1239  NA 125*  K 2.6*  CL 78*  CO2 26  GLUCOSE 559*  BUN 10  CREATININE 0.82  CALCIUM  9.2    Recent Labs  Lab 08/08/23 1239  INR 1.0    Urine  analysis:    Component Value Date/Time   COLORURINE YELLOW 08/08/2023 1219   APPEARANCEUR CLEAR 08/08/2023 1219   LABSPEC 1.026 08/08/2023 1219   PHURINE 6.0 08/08/2023 1219   GLUCOSEU >=500 (A) 08/08/2023 1219   HGBUR NEGATIVE 08/08/2023 1219   BILIRUBINUR NEGATIVE 08/08/2023 1219   KETONESUR 5 (A) 08/08/2023 1219   PROTEINUR NEGATIVE 08/08/2023 1219   NITRITE NEGATIVE 08/08/2023 1219   LEUKOCYTESUR NEGATIVE 08/08/2023 1219    Last A1C:  No results found for: HGBA1C   Radiologic Exams on Admission:   No results found.  EKG:   Independently reviewed.  Orders placed or performed during the hospital encounter of 08/08/23   EKG 12-Lead   EKG 12-Lead   EKG 12-Lead   ---------------------------------------------------------------------------------------------------------------------------------------    Assessment / Plan:   Principal Problem:   CIDP (chronic inflammatory demyelinating polyneuropathy) (HCC) Active Problems:   Gait disturbance   Alcohol  abuse   HLD (hyperlipidemia)   Hypertriglyceridemia   Peripheral neuropathy   Hyponatremia   Macrocytic anemia   Gout   Seizure disorder (HCC)    Hyperglycemia   Assessment and Plan: * CIDP (chronic inflammatory demyelinating polyneuropathy) (HCC) - Patient seems to have a chronic progressive demyelinating probably radiculopathy/neuropathy -Follows with Dr. Vear as an outpatient and appears the patient is failing high-dose p.o. prednisone  patient is being tapered off -Anticipation of initiation of IVIG and  oxycarbazepine (300 mg every morning, 60 mg every afternoon -Notes indicate patient has been -Due to his ataxia and weakness he is deemed disabled  Gait disturbance Severe generalized weakness is due to chronic demyelinating radiculopathy/neuropathy -Currently he is deemed disabled -Consulting PT/OT for evaluation and recommendation  Alcohol  abuse - Positive for withdrawal -Will initiate CIWA protocol  Hyperglycemia No history of diabetes mellitus type 2, likely steroid-induced -Will initiate gentle IV fluid hydration -Check CBG q. ACHS, SSI coverage Will check hemoglobin A1c  Seizure disorder (HCC) No primary formal diagnosis, currently on Lamictal  and Trileptal  will be continued  Gout - Continue home medication as needed colchicine  Macrocytic anemia - Continue stable-monitoring   Hyponatremia Hyponatremia likely due to acute hyperglycemia due to chronic steroid use -Will continue gentle IV fluid hydration normal saline -SIADH workup -Will monitor serum sodium closely  Peripheral neuropathy - Home medications Cymbalta , as needed oxycodone , Ultram , -Per neurology recommendation continue oxcarbazepine  And Lamictal   Hypertriglyceridemia Not any medications  HLD (hyperlipidemia) Not on any statins -Will check fasting lipid panel      Consults called: Neurologist -------------------------------------------------------------------------------------------------------------------------------------------- DVT prophylaxis:  heparin  injection 5,000 Units Start: 08/08/23 2200 TED hose Start: 08/08/23  1436 SCDs Start: 08/08/23 1436   Code Status:   Code Status: Full Code   Admission status: Patient will be admitted as Inpatient, with a greater than 2 midnight length of stay. Level of care: Med-Surg   Family Communication:  none at bedside  (The above findings and plan of care has been discussed with patient in detail, the patient expressed understanding and agreement of above plan)  --------------------------------------------------------------------------------------------------------------------------------------------------  Disposition Plan:  Anticipated 1-2 days Status is: Inpatient Remains inpatient appropriate because: Needing IV medication treatment such as IVIG     ----------------------------------------------------------------------------------------------------------------------------------------------------  Time spent:  52  Min.  Was spent seeing and evaluating the patient, reviewing all medical records, drawn plan of care.  SIGNED: Adriana DELENA Grams, MD, FHM. FAAFP. [place] [facility], Pager  (Please use [email or web address] to page/ or secure chat through epic) If 7PM-7AM, please contact night-coverage [email or web address],  08/08/2023, 3:34 PM

## 2023-08-08 NOTE — Hospital Course (Addendum)
 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   ED evaluation: Blood pressure (!) 159/147, pulse (!) 109, temperature 98.9 F (37.2 C), temperature source Oral, resp. rate 18, SpO2 94%.  LABs; sodium 125, potassium 2.6, chloride 78, serum glucose 559, anion gap 21, WBC 10.2, UA unremarkable with exception of greater than 500 glucose Urine drug screen positive for opioids, marijuana  Neurology was consulted.  They recommended 5-day course of IVIG.  The patient was continued on his home antiepileptic medications and steroids.  Overall, he did show some improvement with his weakness, but continued to have deconditioning.  PT and OT were consulted and recommended CIR.  His hospitalization was also prolonged secondary to development of atrial tachycardia and SVT. Echocardiogram was reassuring.  The patient was started on short acting diltiazem  initially.  His heart rate improved, and the patient was transition to oral long-acting diltiazem  without any further complications.  His potassium and magnesium  were optimized.  His functional status remained stable.  He continued to ambulate with a walker and ultimately agreed to go to CIR for intensive therapy .

## 2023-08-08 NOTE — ED Triage Notes (Signed)
 Patient was brought by [facility] from home for being intoxicated this past week and difficulty caring for himself at home according to EMS conversation with the family.

## 2023-08-08 NOTE — Assessment & Plan Note (Signed)
-   Patient seems to have a chronic progressive demyelinating probably radiculopathy/neuropathy -Follows with Dr. Vear as an outpatient and appears the patient is failing high-dose p.o. prednisone  patient is being tapered off -Anticipation of initiation of IVIG and  oxycarbazepine (300 mg every morning, 60 mg every afternoon -Notes indicate patient has been -Due to his ataxia and weakness he is deemed disabled

## 2023-08-09 ENCOUNTER — Encounter (HOSPITAL_COMMUNITY): Payer: Self-pay | Admitting: Family Medicine

## 2023-08-09 DIAGNOSIS — F10932 Alcohol use, unspecified with withdrawal with perceptual disturbance: Secondary | ICD-10-CM | POA: Diagnosis not present

## 2023-08-09 DIAGNOSIS — G6181 Chronic inflammatory demyelinating polyneuritis: Secondary | ICD-10-CM | POA: Diagnosis not present

## 2023-08-09 LAB — BASIC METABOLIC PANEL
Anion gap: 12 (ref 5–15)
Anion gap: 10 (ref 5–15)
Anion gap: 10 (ref 5–15)
BUN: 15 mg/dL (ref 6–20)
BUN: 16 mg/dL (ref 6–20)
BUN: 17 mg/dL (ref 6–20)
CO2: 29 mmol/L (ref 22–32)
CO2: 28 mmol/L (ref 22–32)
CO2: 29 mmol/L (ref 22–32)
Calcium: 8.2 mg/dL — ABNORMAL LOW (ref 8.9–10.3)
Calcium: 8.3 mg/dL — ABNORMAL LOW (ref 8.9–10.3)
Calcium: 8.5 mg/dL — ABNORMAL LOW (ref 8.9–10.3)
Chloride: 86 mmol/L — ABNORMAL LOW (ref 98–111)
Chloride: 90 mmol/L — ABNORMAL LOW (ref 98–111)
Chloride: 90 mmol/L — ABNORMAL LOW (ref 98–111)
Creatinine, Ser: 0.67 mg/dL (ref 0.61–1.24)
Creatinine, Ser: 0.66 mg/dL (ref 0.61–1.24)
Creatinine, Ser: 0.65 mg/dL (ref 0.61–1.24)
GFR, Estimated: >60 mL/min (ref >60)
GFR, Estimated: >60 mL/min (ref >60)
GFR, Estimated: >60 mL/min (ref >60)
Glucose, Bld: 195 mg/dL — ABNORMAL HIGH (ref 70–99)
Glucose, Bld: 235 mg/dL — ABNORMAL HIGH (ref 70–99)
Glucose, Bld: 188 mg/dL — ABNORMAL HIGH (ref 70–99)
Potassium: 2.8 mmol/L — ABNORMAL LOW (ref 3.5–5.1)
Potassium: 3 mmol/L — ABNORMAL LOW (ref 3.5–5.1)
Potassium: 3.2 mmol/L — ABNORMAL LOW (ref 3.5–5.1)
Sodium: 127 mmol/L — ABNORMAL LOW (ref 135–145)
Sodium: 128 mmol/L — ABNORMAL LOW (ref 135–145)
Sodium: 129 mmol/L — ABNORMAL LOW (ref 135–145)

## 2023-08-09 LAB — BLOOD GAS, VENOUS
Acid-Base Excess: 12.8 mmol/L — ABNORMAL HIGH (ref 0.0–2.0)
Bicarbonate: 36.8 mmol/L — ABNORMAL HIGH (ref 20.0–28.0)
Drawn by: 1517
O2 Saturation: 90.7 %
Patient temperature: 37.1
pCO2, Ven: 43 mm[Hg] — ABNORMAL LOW (ref 44–60)
pH, Ven: 7.54 — ABNORMAL HIGH (ref 7.25–7.43)
pO2, Ven: 58 mm[Hg] — ABNORMAL HIGH (ref 32–45)

## 2023-08-09 LAB — OSMOLALITY: Osmolality: 360 mosm/kg (ref 275–295)

## 2023-08-09 LAB — AMMONIA: Ammonia: 24 umol/L (ref 9–35)

## 2023-08-09 LAB — HEPATIC FUNCTION PANEL
ALT: 56 U/L — ABNORMAL HIGH (ref 0–44)
AST: 59 U/L — ABNORMAL HIGH (ref 15–41)
Albumin: 2.6 g/dL — ABNORMAL LOW (ref 3.5–5.0)
Alkaline Phosphatase: 113 U/L (ref 38–126)
Bilirubin, Direct: 0.5 mg/dL — ABNORMAL HIGH (ref 0.0–0.2)
Indirect Bilirubin: 0.9 mg/dL (ref 0.3–0.9)
Total Bilirubin: 1.4 mg/dL — ABNORMAL HIGH (ref 0.0–1.2)
Total Protein: 6.6 g/dL (ref 6.5–8.1)

## 2023-08-09 LAB — LIPID PANEL
Cholesterol: 131 mg/dL (ref 0–200)
HDL: 62 mg/dL (ref >40)
LDL Cholesterol: 36 mg/dL (ref 0–99)
Total CHOL/HDL Ratio: 2.1 {ratio}
Triglycerides: 165 mg/dL — ABNORMAL HIGH (ref <150)
VLDL: 33 mg/dL (ref 0–40)

## 2023-08-09 LAB — LACTIC ACID, PLASMA: Lactic Acid, Venous: 2.2 mmol/L (ref 0.5–1.9)

## 2023-08-09 LAB — CBC
HCT: 30.4 % — ABNORMAL LOW (ref 39.0–52.0)
Hemoglobin: 10.8 g/dL — ABNORMAL LOW (ref 13.0–17.0)
MCH: 31.2 pg (ref 26.0–34.0)
MCHC: 35.5 g/dL (ref 30.0–36.0)
MCV: 87.9 fL (ref 80.0–100.0)
Platelets: 300 10*3/uL (ref 150–400)
RBC: 3.46 MIL/uL — ABNORMAL LOW (ref 4.22–5.81)
RDW: 14 % (ref 11.5–15.5)
WBC: 10.3 10*3/uL (ref 4.0–10.5)
nRBC: 1.9 % — ABNORMAL HIGH (ref 0.0–0.2)

## 2023-08-09 LAB — GLUCOSE, CAPILLARY
Glucose-Capillary: 229 mg/dL — ABNORMAL HIGH (ref 70–99)
Glucose-Capillary: 191 mg/dL — ABNORMAL HIGH (ref 70–99)

## 2023-08-09 LAB — CBG MONITORING, ED: Glucose-Capillary: 175 mg/dL — ABNORMAL HIGH (ref 70–99)

## 2023-08-09 MED ORDER — PHENOBARBITAL SODIUM 130 MG/ML IJ SOLN
97.5000 mg | Freq: Three times a day (TID) | INTRAMUSCULAR | Status: AC
  Administered 2023-08-09 – 2023-08-10 (×3): 97.5 mg via INTRAVENOUS
  Filled 2023-08-09 (×4): qty 1

## 2023-08-09 MED ORDER — PREDNISONE 20 MG PO TABS
40.0000 mg | ORAL_TABLET | Freq: Every day | ORAL | Status: DC
  Administered 2023-08-09: 40 mg via ORAL
  Filled 2023-08-09: qty 2

## 2023-08-09 MED ORDER — POTASSIUM CHLORIDE CRYS ER 20 MEQ PO TBCR
40.0000 meq | EXTENDED_RELEASE_TABLET | Freq: Two times a day (BID) | ORAL | Status: DC
  Administered 2023-08-09: 40 meq via ORAL
  Filled 2023-08-09: qty 2

## 2023-08-09 MED ORDER — MAGNESIUM SULFATE 2 GM/50ML IV SOLN
2.0000 g | Freq: Once | INTRAVENOUS | Status: AC
  Administered 2023-08-09: 2 g via INTRAVENOUS
  Filled 2023-08-09: qty 50

## 2023-08-09 MED ORDER — ORAL CARE MOUTH RINSE: 15.0000 mL | OROMUCOSAL | Status: DC | PRN

## 2023-08-09 MED ORDER — CHLORDIAZEPOXIDE HCL 25 MG PO CAPS: 50.0000 mg | ORAL_CAPSULE | Freq: Three times a day (TID) | ORAL | Status: DC

## 2023-08-09 MED ORDER — LACTATED RINGERS IV BOLUS
500.0000 mL | Freq: Once | INTRAVENOUS | Status: AC
  Administered 2023-08-09: 500 mL via INTRAVENOUS

## 2023-08-09 MED ORDER — CHLORDIAZEPOXIDE HCL 25 MG PO CAPS: 50.0000 mg | ORAL_CAPSULE | Freq: Four times a day (QID) | ORAL | Status: DC

## 2023-08-09 MED ORDER — LORAZEPAM 2 MG/ML IJ SOLN: 1.0000 mg | INTRAMUSCULAR | Status: DC | PRN

## 2023-08-09 MED ORDER — PHENOBARBITAL SODIUM 130 MG/ML IJ SOLN
65.0000 mg | Freq: Three times a day (TID) | INTRAMUSCULAR | Status: AC
Start: 2023-08-11 — End: 2023-08-13
  Administered 2023-08-11 – 2023-08-13 (×6): 65 mg via INTRAVENOUS
  Filled 2023-08-09 (×6): qty 1

## 2023-08-09 MED ORDER — DEXMEDETOMIDINE HCL IN NACL 400 MCG/100ML IV SOLN
0.2000 ug/kg/h | INTRAVENOUS | Status: DC
  Administered 2023-08-09 – 2023-08-10 (×2): 0.2 ug/kg/h via INTRAVENOUS
  Administered 2023-08-10: 0.7 ug/kg/h via INTRAVENOUS
  Administered 2023-08-11 (×2): 0.6 ug/kg/h via INTRAVENOUS
  Administered 2023-08-11: 0.5 ug/kg/h via INTRAVENOUS
  Administered 2023-08-12 (×2): 0.3 ug/kg/h via INTRAVENOUS
  Filled 2023-08-09 (×10): qty 100

## 2023-08-09 MED ORDER — THIAMINE HCL 100 MG/ML IJ SOLN
INTRAMUSCULAR | Status: AC
  Filled 2023-08-09: qty 6

## 2023-08-09 MED ORDER — THIAMINE HCL 100 MG/ML IJ SOLN
500.0000 mg | Freq: Three times a day (TID) | INTRAMUSCULAR | Status: DC
  Administered 2023-08-10 – 2023-08-11 (×5): 500 mg via INTRAVENOUS
  Filled 2023-08-09 (×8): qty 5

## 2023-08-09 MED ORDER — CHLORDIAZEPOXIDE HCL 25 MG PO CAPS: 50.0000 mg | ORAL_CAPSULE | Freq: Two times a day (BID) | ORAL | Status: DC

## 2023-08-09 MED ORDER — PHENOBARBITAL SODIUM 65 MG/ML IJ SOLN: 32.5000 mg | Freq: Three times a day (TID) | INTRAMUSCULAR | Status: DC

## 2023-08-09 MED ORDER — CHLORDIAZEPOXIDE HCL 25 MG PO CAPS: 25.0000 mg | ORAL_CAPSULE | Freq: Two times a day (BID) | ORAL | Status: DC

## 2023-08-09 MED ORDER — FUROSEMIDE 40 MG PO TABS: 40.0000 mg | ORAL_TABLET | Freq: Every day | ORAL | Status: DC

## 2023-08-09 NOTE — Consult Note (Signed)
 [facility] Tele[facility] Consult Services  Stat Consult  Patient Name:   Connor Copeland, Connor Copeland Date of Birth:   January 09, 1976 Identification Number:   MRN - [id number] Date of Service:   08/09/2023 21:58:15  Diagnosis:       G93.49 - Encephalopathy Multifactorial  Impression Connor Copeland is a 48 y.o. man with a history of hyperlipidemia, alcohol  abuse, CIDP, seizure disorder, neuropathic pain who was admitted on 08/07/22 for progressive weakness and increasing falls. He was started on a course of IVIG 400 mg/kg x 5 days for CIDP on 1/3. Per chart review, patient has had worsening of alcohol  withdrawal and has been transferred to ICU for Precedex . Patient is unable to provide a history. He is agitated, in 2 point restraints, and mumbles unintelligible sounds. On exam, he has no lateralizing weakness. Face is symmetric. He has spontaneous symmetric antigravity movements of all 4 extremities. He resists eye opening. Labs are significant for Na 128, urine tox screen positive for opiates and THC. He drinks 42 alcoholic drinks per week. Patient does seem encephalopathic on exam. IVIG can rarely lead to PRES, but it is difficult to say whether this is what is going on because patient has multiple other issues that could be causing him to be encephalopathic (e.g. alcohol  withdrawal, hyponatremia).   Recommendations: - continued treatment of alcohol  withdrawal and metabolic derangements per primary team - can order non contrast head CT to evaluate for any gross signs of PRES (sometimes PRES can be seen on CT) - as long as there are no acute findings on the non contrast head CT, would continue the current course of IVIG as planned - MRI brain with and without contrast when patient is able to tolerate as part of altered mental status workup - routine EEG (if able to tolerate)       Recommendations: Our recommendations are outlined below.  Diagnostic Studies : MRI brain  w/wo contrast Routine EEG  Disposition : Neurology will follow    ----------------------------------------------------------------------------------------------------    Metrics: Dispatch Time: 08/09/2023 21:55:36 Callback Response Time: 08/09/2023 21:59:30  Primary Provider Notified of Diagnostic Impression and Management Plan on: 08/09/2023 22:41:21     ----------------------------------------------------------------------------------------------------  Chief Complaint: altered mental status  History of Present Illness: Patient is a 48 year old Male. Connor Copeland is a 48 y.o. man with a history of hyperlipidemia, alcohol  abuse, CIDP, seizure disorder, neuropathic pain who was admitted on 08/07/22 for progressive weakness and increasing falls. He was started on a course of IVIG 400 mg/kg x 5 days for CIDP on 1/3. Per chart review, patient has had worsening of alcohol  withdrawal and has been transferred to ICU for Precedex . Patient is unable to provide a history. He is agitated, in 2 point restraints, and mumbles unintelligible sounds.        Past Medical History: Other PMH:  hyperlipidemia, alcohol  abuse, CIDP, seizure disorder, neuropathic pain  Medications:  Anticoagulant use:  Yes heparin  5000 units [place] q8 No Antiplatelet use Reviewed EMR for current medications  Allergies:  Reviewed Description: gabapentin  Social History: Alcohol  Use: Yes  Family History:  There is no family history of premature cerebrovascular disease pertinent to this consultation  ROS : 14 Points Review of Systems was performed and was negative except mentioned in HPI.  Past Surgical History: There Is No Surgical History Contributory To Today's Visit    Examination: BP(123/82), Pulse(104),  Neuro Exam:  General: awake and agitated, keeps eyes closed  Speech: mumbles unintelligible sounds  Language: does not follow  any commands  Face: Symmetric:  Visual Fields:  resists eye opening, so unable to test  Extraocular Movements: resists eye opening, so unable to test  Motor Exam: spontaneous symmetric antigravity movements of all 4 extremities  Sensation: Intact: gets agitated with pinching bilaterally  Spoke with : Dr. Keturah    This consult was conducted in real time using interactive audio and immunologist. Patient was informed of the technology being used for this visit and agreed to proceed. Patient located in hospital and provider located at home/office setting.  Patient is being evaluated for possible acute neurologic impairment and high probability of imminent or life - threatening deterioration.I spent total of 36 minutes providing care to this patient, including time for face to face visit via telemedicine, review of medical records, imaging studies and discussion of findings with providers, the patient and / or family.   Dr Sly Parlee   [facility] For Inpatient follow-up with [facility] physician please call [facility] at [phone]. As we are not an outpatient service for any post hospital discharge needs please contact the hospital for assistance.  If you have any questions for the [facility] physicians or need to reconsult for clinical or diagnostic changes please contact us  via [facility] at [phone].

## 2023-08-09 NOTE — Evaluation (Addendum)
 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  Clinical Impression   Pt was very limited today's Physical Therapy Evaluation, due to poor mentation and cognition in light of medical admission status, see above for details. . 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 Pt's mother present during evaluation, assisted PLOF, and reported at current level cannot be assist with family at home. Mother wishing to pursue rehab for pt. Based upon these deficits/impairments, patient will benefit from continued skilled physical therapy services during remainder of hospital stay and at the next recommended venue of care to address deficits and promote return to optimal function.                If plan is discharge home, recommend the following: A lot of help with walking and/or transfers;Two people to help with walking and/or transfers;A lot of help with bathing/dressing/bathroom;Two people to help with bathing/dressing/bathroom   Can travel by private vehicle   No    Equipment Recommendations None recommended by PT  Recommendations for Other Services       Functional Status Assessment Patient has had a recent decline in their functional status and demonstrates the ability to make significant improvements in function in a reasonable and predictable amount of time.     Precautions /  Restrictions Precautions Precautions: Fall Restrictions Weight Bearing Restrictions Per Provider Order: No      Mobility  Bed Mobility Overal bed mobility: Needs Assistance Bed Mobility: Supine to Sit, Sit to Supine, Rolling Rolling: Max assist   Supine to sit: Max assist Sit to supine: Max assist   General bed mobility comments: max assist for sit/supine and rolling with handover hand cues for use of bed rail. Assisted with nurse with tech while performing her duties. Able to complete with +1 at max assist/ Patient Response: Impulsive, Cooperative  Transfers Overall transfer level: Needs assistance   Transfers: Sit to/from Stand Sit to Stand: Max assist           General transfer comment: sit/stand for max assist for partial transfer with hand over hand cues for proper RW management. Unsafe to attempt further due to poor cognition.    Ambulation/Gait               General Gait Details: Terminated due to safety concerns.  Stairs            Wheelchair Mobility     Tilt Bed Tilt Bed Patient Response: Impulsive, Cooperative  Modified Rankin (Stroke Patients Only)       Balance Overall balance assessment: Needs assistance Sitting-balance support: Feet supported, No upper extremity supported Sitting balance-Leahy Scale: Fair Sitting balance - Comments: sitting EOB     Standing balance-Leahy Scale: Poor Standing balance comment: unable to achieve full stand                             Pertinent Vitals/Pain Pain Assessment Pain Assessment: 0-10 Pain Score: 10-Worst pain ever Pain Location: '  everywhere' Pain Intervention(s): Limited activity within patient's tolerance, Monitored during session    Home Living Family/patient expects to be discharged to:: Private residence Living Arrangements: Alone Available Help at Discharge: Family;Available PRN/intermittently Type of Home: House Home Access: Stairs to enter Entrance  Stairs-Rails: Right;Left;Can reach both Entrance Stairs-Number of Steps: 4 Alternate Level Stairs-Number of Steps: 14 Home Layout: Two level Home Equipment: None Additional Comments: Pt reports living in ranch-poor historian    Prior Function Prior Level of Function : Independent/Modified Independent             Mobility Comments: Past few weeks, ambulating with RW and cane at home per pt's mom ADLs Comments: Pt's mother reports that family has been coming over to assist with ADLs the past few days.     Extremity/Trunk Assessment   Upper Extremity Assessment Upper Extremity Assessment: Defer to OT evaluation    Lower Extremity Assessment Lower Extremity Assessment: Generalized weakness;RLE deficits/detail;LLE deficits/detail RLE Deficits / Details: 3+/5 knee extension MMT RLE Sensation: decreased light touch;decreased proprioception LLE Deficits / Details: 3+/5 knee extension MMT LLE Sensation: decreased light touch;decreased proprioception    Cervical / Trunk Assessment Cervical / Trunk Assessment: Kyphotic  Communication   Communication Cueing Techniques: Verbal cues;Tactile cues  Cognition Arousal: Lethargic Behavior During Therapy: Agitated, Impulsive Overall Cognitive Status: Impaired/Different from baseline Area of Impairment: Orientation, Attention                 Orientation Level: Person, Place, Time, Situation Current Attention Level: Alternating                    General Comments      Exercises     Assessment/Plan    PT Assessment Patient needs continued PT services  PT Problem List Decreased strength;Decreased activity tolerance;Decreased balance;Decreased mobility       PT Treatment Interventions DME instruction;Gait training;Functional mobility training;Therapeutic activities;Therapeutic exercise;Balance training;Neuromuscular re-education    PT Goals (Current goals can be found in the Care Plan section)  Acute Rehab PT  Goals Patient Stated Goal: get stronger and more independent PT Goal Formulation: With patient/family Time For Goal Achievement: 08/23/23 Potential to Achieve Goals: Good    Frequency Min 3X/week     Co-evaluation               AM-PAC PT 6 Clicks Mobility  Outcome Measure Help needed turning from your back to your side while in a flat bed without using bedrails?: A Lot Help needed moving from lying on your back to sitting on the side of a flat bed without using bedrails?: A Lot Help needed moving to and from a bed to a chair (including a wheelchair)?: A Lot Help needed standing up from a chair using your arms (e.g., wheelchair or bedside chair)?: Total Help needed to walk in hospital room?: Total Help needed climbing 3-5 steps with a railing? : Total 6 Click Score: 9    End of Session Equipment Utilized During Treatment: Gait belt Activity Tolerance: Patient tolerated treatment well;Patient limited by pain;Patient limited by fatigue Patient left: in bed;with call bell/phone within reach;with bed alarm set;with family/visitor present Nurse Communication: Mobility status PT Visit Diagnosis: Unsteadiness on feet (R26.81);Other abnormalities of gait and mobility (R26.89);Muscle weakness (generalized) (M62.81)    Time: 9143-9089 PT Time Calculation (min) (ACUTE ONLY): 14 min   Charges:   PT Evaluation $PT Eval Low Complexity: 1 Low   PT General Charges $$ ACUTE PT VISIT: 1 Visit  Omega JONETTA Bottcher PT, DPT [facility] [phone] office  Omega JONETTA Bottcher 08/09/2023, 9:27 AM

## 2023-08-09 NOTE — Significant Event (Signed)
 Notified by RN of worsening alcohol  withdrawal, CIWA 19 -> 24 despite Ativan . Initially to stepdown but with continued worsening withdrawal transferred to ICU for precedex . On evaluation tachycardic, other vs wnl. On exam he is awake but not alert to examiner, PERRL, no facial droop, moving all extremities, speech is garbled, he repeats back where are you to my questioning, A+Ox0 (unable to answer orientation questions). Tone is increased throughout, tachycardiac and regular. On labs recent chemistries with Na 128 (uosm 610 with uNa <10 suggestive of hypovolemic low solute hyponatremia), hyperglycemic, + tox for opiates / THC on admission. Shx recorded 42 drink / wk.   A/P:  AMS,  Severe alcohol  withdrawal  - Check lactate, BMP, LFT, ammonia, VBG.  - Empirically start on high dose thiamine   - Started on Precedex  gtt  - For severe alcohol  withdrawal, I discussed his case with PCCM Dr. Kara who viewed pt via remote telemetry. Recommending start Phenobarb in addition to Ativan  / Precedex .  - Discussed with Pharmacy at [facility] who have placed orders for phenobarb  - Will consult Tele neuro to ensure this is not AE of IVIG for his CIDP   CRITICAL CARE Performed by: Dorn Dawson   Total critical care time: 35+ minutes  Critical care time was exclusive of separately billable procedures and treating other patients.  Critical care was necessary to treat or prevent imminent or life-threatening deterioration.  Critical care was time spent personally by me on the following activities: development of treatment plan with patient and/or surrogate as well as nursing, discussions with consultants, evaluation of patient's response to treatment, examination of patient, obtaining history from patient or surrogate, ordering and performing treatments and interventions, ordering and review of laboratory studies, ordering and review of radiographic studies, pulse oximetry and re-evaluation of patient's  condition.

## 2023-08-09 NOTE — ED Notes (Addendum)
 ED TO INPATIENT HANDOFF REPORT  ED Nurse Name and Phone #: Chyanne Kohut  S Name/Age/Gender Connor Copeland 48 y.o. male Room/Bed: APA14/APA14  Code Status   Code Status: Full Code  Home/SNF/Other Home Patient oriented to: self, place Is this baseline? No  Triage Complete: Triage complete  Chief Complaint Hypoglycemia [E16.2] CIDP (chronic inflammatory demyelinating polyneuropathy) (HCC) [G61.81]  Triage Note Patient was brought by [facility] from home for being intoxicated this past week and difficulty caring for himself at home according to EMS conversation with the family.   Allergies Allergies  Allergen Reactions   Gabapentin Swelling    Swelling off the legs    Level of Care/Admitting Diagnosis ED Disposition     ED Disposition  Admit   Condition  --   Comment  Hospital Area: [facility] [[id number]]  Level of Care: Med-Surg [16]  Covid Evaluation: Asymptomatic - no recent exposure (last 10 days) testing not required  Diagnosis: CIDP (chronic inflammatory demyelinating polyneuropathy) [facility]) [[id number]]  Admitting Physician: WILLETTE ADRIANA DELENA ALPHA  Attending Physician: WILLETTE ADRIANA DELENA [phone]  Certification:: I certify this patient will need inpatient services for at least 2 midnights          B Medical/Surgery History Past Medical History:  Diagnosis Date   ETOH abuse    Gout    Past Surgical History:  Procedure Laterality Date   FOOT SURGERY       A IV Location/Drains/Wounds Patient Lines/Drains/Airways Status     Active Line/Drains/Airways     Name Placement date Placement time Site Days   Peripheral IV 08/08/23 18 G 1.16 Anterior;Left Forearm 08/08/23  1655  Forearm  1   Peripheral IV 08/08/23 20 G Left;Posterior Hand 08/08/23  1938  Hand  1   Wound / Incision (Open or Dehisced) 01/28/23 Skin tear Elbow Posterior;Right scab from patient's fall, pulled off and became what looks like skin tear 01/28/23  1600  Elbow  193             Intake/Output Last 24 hours  Intake/Output Summary (Last 24 hours) at 08/09/2023 0744 Last data filed at 08/09/2023 9381 Gross per 24 hour  Intake 1321.7 ml  Output 1100 ml  Net 221.7 ml    Labs/Imaging Results for orders placed or performed during the hospital encounter of 08/08/23 (from the past 48 hours)  Urinalysis, Routine w reflex microscopic -Urine, Clean Catch     Status: Abnormal   Collection Time: 08/08/23 12:19 PM  Result Value Ref Range   Color, Urine YELLOW YELLOW   APPearance CLEAR CLEAR   Specific Gravity, Urine 1.026 1.005 - 1.030   pH 6.0 5.0 - 8.0   Glucose, UA >=500 (A) NEGATIVE mg/dL   Hgb urine dipstick NEGATIVE NEGATIVE   Bilirubin Urine NEGATIVE NEGATIVE   Ketones, ur 5 (A) NEGATIVE mg/dL   Protein, ur NEGATIVE NEGATIVE mg/dL   Nitrite NEGATIVE NEGATIVE   Leukocytes,Ua NEGATIVE NEGATIVE   RBC / HPF 0-5 0 - 5 RBC/hpf   WBC, UA 0-5 0 - 5 WBC/hpf   Bacteria, UA NONE SEEN NONE SEEN   Squamous Epithelial / HPF 0-5 0 - 5 /HPF   Mucus PRESENT    Hyaline Casts, UA PRESENT     Comment: Performed at [facility], [address], [place], [place] [address]  Rapid urine drug screen (hospital performed)     Status: Abnormal   Collection Time: 08/08/23 12:19 PM  Result Value Ref Range   Opiates POSITIVE (A) NONE  DETECTED   Cocaine NONE DETECTED NONE DETECTED   Benzodiazepines NONE DETECTED NONE DETECTED   Amphetamines NONE DETECTED NONE DETECTED   Tetrahydrocannabinol POSITIVE (A) NONE DETECTED   Barbiturates NONE DETECTED NONE DETECTED    Comment: (NOTE) DRUG SCREEN FOR MEDICAL PURPOSES ONLY.  IF CONFIRMATION IS NEEDED FOR ANY PURPOSE, NOTIFY LAB WITHIN 5 DAYS.  LOWEST DETECTABLE LIMITS FOR URINE DRUG SCREEN Drug Class                     Cutoff (ng/mL) Amphetamine and metabolites    1000 Barbiturate and metabolites    200 Benzodiazepine                 200 Opiates and metabolites        300 Cocaine and metabolites        300 THC                             50 Performed at [facility], [address] [place], [place] [address]   CBC with Differential     Status: Abnormal   Collection Time: 08/08/23 12:39 PM  Result Value Ref Range   WBC 10.2 4.0 - 10.5 K/uL   RBC 4.29 4.22 - 5.81 MIL/uL   Hemoglobin 13.6 13.0 - 17.0 g/dL   HCT 62.1 (L) 60.9 - 47.9 %   MCV 88.1 80.0 - 100.0 fL   MCH 31.7 26.0 - 34.0 pg   MCHC 36.0 30.0 - 36.0 g/dL   RDW 85.8 88.4 - 84.4 %   Platelets 386 150 - 400 K/uL   nRBC 1.4 (H) 0.0 - 0.2 %   Neutrophils Relative % 58 %   Neutro Abs 5.9 1.7 - 7.7 K/uL   Lymphocytes Relative 31 %   Lymphs Abs 3.2 0.7 - 4.0 K/uL   Monocytes Relative 6 %   Monocytes Absolute 0.7 0.1 - 1.0 K/uL   Eosinophils Relative 0 %   Eosinophils Absolute 0.0 0.0 - 0.5 K/uL   Basophils Relative 1 %   Basophils Absolute 0.1 0.0 - 0.1 K/uL   Immature Granulocytes 4 %   Abs Immature Granulocytes 0.39 (H) 0.00 - 0.07 K/uL    Comment: Performed at [facility], [address], [place], [place] [address]  Basic metabolic panel     Status: Abnormal   Collection Time: 08/08/23 12:39 PM  Result Value Ref Range   Sodium 125 (L) 135 - 145 mmol/L   Potassium 2.6 (LL) 3.5 - 5.1 mmol/L    Comment: CRITICAL RESULT CALLED TO, READ BACK BY AND VERIFIED WITH FOWLER @ 1412 ON [id number] BY HENDERSON L   Chloride 78 (L) 98 - 111 mmol/L   CO2 26 22 - 32 mmol/L   Glucose, Bld 559 (HH) 70 - 99 mg/dL    Comment: CRITICAL RESULT CALLED TO, READ BACK BY AND VERIFIED WITH FOWLER @ 1413 ON [id number] BY HENDERSON L Glucose reference range applies only to samples taken after fasting for at least 8 hours.    BUN 10 6 - 20 mg/dL   Creatinine, Ser 9.17 0.61 - 1.24 mg/dL   Calcium  9.2 8.9 - 10.3 mg/dL   GFR, Estimated >39 >39 mL/min    Comment: (NOTE) Calculated using the CKD-EPI Creatinine Equation (2021)    Anion gap 21 (H) 5 - 15    Comment: ELECTROLYTES REPEATED TO VERIFY Performed at [facility], [address], [place], [place] [address]  Magnesium      Status: None   Collection Time: 08/08/23 12:39 PM  Result Value Ref Range   Magnesium  1.7 1.7 - 2.4 mg/dL    Comment: Performed at [facility], [address] [place], [place] [address]  Phosphorus     Status: None   Collection Time: 08/08/23 12:39 PM  Result Value Ref Range   Phosphorus 2.7 2.5 - 4.6 mg/dL    Comment: Performed at [facility], [address] [place], [place] [address]  Protime-INR     Status: None   Collection Time: 08/08/23 12:39 PM  Result Value Ref Range   Prothrombin Time 13.1 11.4 - 15.2 seconds   INR 1.0 0.8 - 1.2    Comment: (NOTE) INR goal varies based on device and disease states. Performed at [facility], [address], [place], [place] [address]   Vitamin B12     Status: Abnormal   Collection Time: 08/08/23 12:39 PM  Result Value Ref Range   Vitamin B-12 1,155 (H) 180 - 914 pg/mL    Comment: (NOTE) This assay is not validated for testing neonatal or myeloproliferative syndrome specimens for Vitamin B12 levels. Performed at [facility], [address], [place], [place] [address]   Folate     Status: Abnormal   Collection Time: 08/08/23 12:39 PM  Result Value Ref Range   Folate 4.9 (L) >5.9 ng/mL    Comment: Performed at [facility], [address] [place], [place] [address]  TSH     Status: None   Collection Time: 08/08/23 12:39 PM  Result Value Ref Range   TSH 0.911 0.350 - 4.500 uIU/mL    Comment: Performed by a 3rd Generation assay with a functional sensitivity of <=0.01 uIU/mL. Performed at [facility], [address] [place], [place] [address]   Sodium     Status: Abnormal   Collection Time: 08/08/23 12:39 PM  Result Value Ref Range   Sodium 127 (L) 135 - 145 mmol/L    Comment: Performed at [facility], [address], [place], [place] [address]  Na and K (sodium & potassium), rand urine     Status: None   Collection Time: 08/08/23  2:53 PM  Result Value Ref Range   Sodium, Ur <10 mmol/L   Potassium Urine 11 mmol/L     Comment: Performed at [facility], [address] [place], [place] [address]  Osmolality, urine     Status: None   Collection Time: 08/08/23  2:53 PM  Result Value Ref Range   Osmolality, Ur 610 300 - 900 mOsm/kg    Comment: Performed at [facility], [address] [place], [place] [address]  CBG monitoring, ED     Status: Abnormal   Collection Time: 08/08/23  5:43 PM  Result Value Ref Range   Glucose-Capillary 407 (H) 70 - 99 mg/dL    Comment: Glucose reference range applies only to samples taken after fasting for at least 8 hours.  Basic metabolic panel     Status: Abnormal   Collection Time: 08/09/23  3:36 AM  Result Value Ref Range   Sodium 127 (L) 135 - 145 mmol/L   Potassium 2.8 (L) 3.5 - 5.1 mmol/L   Chloride 86 (L) 98 - 111 mmol/L   CO2 29 22 - 32 mmol/L   Glucose, Bld 195 (H) 70 - 99 mg/dL    Comment: Glucose reference range applies only to samples taken after fasting for at least 8 hours.   BUN 15  6 - 20 mg/dL   Creatinine, Ser 9.32 0.61 - 1.24 mg/dL   Calcium  8.2 (L) 8.9 - 10.3 mg/dL   GFR, Estimated >39 >39 mL/min    Comment: (NOTE) Calculated using the CKD-EPI Creatinine Equation (2021)    Anion gap 12 5 - 15    Comment: Performed at [facility], [address] [place], [place] [address]  CBC     Status: Abnormal   Collection Time: 08/09/23  3:36 AM  Result Value Ref Range   WBC 10.3 4.0 - 10.5 K/uL   RBC 3.46 (L) 4.22 - 5.81 MIL/uL   Hemoglobin 10.8 (L) 13.0 - 17.0 g/dL   HCT 69.5 (L) 60.9 - 47.9 %   MCV 87.9 80.0 - 100.0 fL   MCH 31.2 26.0 - 34.0 pg   MCHC 35.5 30.0 - 36.0 g/dL   RDW 85.9 88.4 - 84.4 %   Platelets 300 150 - 400 K/uL   nRBC 1.9 (H) 0.0 - 0.2 %    Comment: Performed at [facility], [address] [place], [place] [address]   No results found.  Pending Labs Unresulted Labs (From admission, onward)     Start     Ordered   08/09/23 0500  Basic metabolic panel  Daily,   R      08/08/23 1441   08/09/23 0500  CBC  Daily,   R       08/08/23 1441   08/08/23 1453  Osmolality  Once,   R        08/08/23 1453   08/08/23 1442  Hemoglobin A1c  Once,   R       Comments: To assess prior glycemic control    08/08/23 1441   08/08/23 1436  Expectorated Sputum Assessment w Gram Stain, Rflx to Resp Cult  Once,   R       Comments: If productive cough    08/08/23 1441            Vitals/Pain Today's Vitals   08/09/23 0520 08/09/23 0600 08/09/23 0608 08/09/23 0700  BP: (!) 132/98 (!) 124/91  (!) 131/105  Pulse: (!) 104 (!) 107  (!) 107  Resp:      Temp:   97.9 F (36.6 C)   TempSrc:   Oral   SpO2: 96% 90%  92%  PainSc:        Isolation Precautions No active isolations  Medications Medications  heparin  injection 5,000 Units (5,000 Units Subcutaneous Given 08/09/23 9390)  sodium chloride  flush (NS) 0.9 % injection 3 mL (3 mLs Intravenous Not Given 08/08/23 2211)  acetaminophen  (TYLENOL ) tablet 650 mg (has no administration in time range)    Or  acetaminophen  (TYLENOL ) suppository 650 mg (has no administration in time range)  HYDROmorphone  (DILAUDID ) injection 0.5-1 mg (has no administration in time range)  zolpidem  (AMBIEN ) tablet 5 mg (has no administration in time range)  senna-docusate (Senokot-S) tablet 1 tablet (has no administration in time range)  bisacodyl  (DULCOLAX) EC tablet 5 mg (has no administration in time range)  sodium phosphate (FLEET) enema 1 enema (has no administration in time range)  ondansetron  (ZOFRAN ) tablet 4 mg (has no administration in time range)    Or  ondansetron  (ZOFRAN ) injection 4 mg (has no administration in time range)  ipratropium (ATROVENT ) nebulizer solution 0.5 mg (has no administration in time range)  levalbuterol  (XOPENEX ) nebulizer solution 0.63 mg (has no administration in time range)  hydrALAZINE  (APRESOLINE ) injection 10 mg (has no administration in time  range)  insulin  aspart (novoLOG ) injection 0-6 Units (6 Units Subcutaneous Given 08/08/23 1759)   HYDROcodone -acetaminophen  (NORCO) 7.5-325 MG per tablet 1 tablet (has no administration in time range)  traMADol  (ULTRAM ) tablet 50 mg (has no administration in time range)  furosemide  (LASIX ) tablet 40 mg (has no administration in time range)  DULoxetine  (CYMBALTA ) DR capsule 60 mg (has no administration in time range)  nicotine  (NICODERM CQ  - dosed in mg/24 hours) patch 21 mg (21 mg Transdermal Patch Applied 08/08/23 1528)  nortriptyline  (PAMELOR ) capsule 50 mg (50 mg Oral Given 08/08/23 2231)  Oxcarbazepine  (TRILEPTAL ) tablet 300 mg (has no administration in time range)  pantoprazole  (PROTONIX ) EC tablet 40 mg (40 mg Oral Given 08/08/23 1716)  LORazepam  (ATIVAN ) tablet 1-4 mg ( Oral See Alternative 08/08/23 2229)    Or  LORazepam  (ATIVAN ) injection 1-4 mg (2 mg Intravenous Given 08/08/23 2229)  thiamine  (VITAMIN B1) tablet 100 mg ( Oral See Alternative 08/08/23 1713)    Or  thiamine  (VITAMIN B1) injection 100 mg (100 mg Intravenous Given 08/08/23 1713)  folic acid  (FOLVITE ) tablet 1 mg (1 mg Oral Given 08/08/23 1716)  multivitamin with minerals tablet 1 tablet (1 tablet Oral Given 08/08/23 1716)  Immune Globulin  10% (PRIVIGEN ) IV infusion 40 g (0 g Intravenous Stopped 08/08/23 2219)  Oxcarbazepine  (TRILEPTAL ) tablet 600 mg (600 mg Oral Given 08/08/23 2233)  febuxostat  (ULORIC ) tablet 40 mg (has no administration in time range)  predniSONE  (DELTASONE ) tablet 40 mg (has no administration in time range)  potassium chloride  SA (KLOR-CON  M) CR tablet 40 mEq (has no administration in time range)  potassium chloride  SA (KLOR-CON  M) CR tablet 40 mEq (40 mEq Oral Given 08/08/23 1527)  oxyCODONE -acetaminophen  (PERCOCET/ROXICET) 5-325 MG per tablet 1 tablet (1 tablet Oral Given 08/08/23 1528)  magnesium  sulfate IVPB 2 g 50 mL (0 g Intravenous Stopped 08/08/23 1830)  potassium chloride  10 mEq in 100 mL IVPB (0 mEq Intravenous Stopped 08/08/23 2145)    Mobility Walks with device     Focused Assessments Cardiac Assessment  Handoff:    Lab Results  Component Value Date   CKTOTAL 43 (L) 02/03/2023   Lab Results  Component Value Date   DDIMER 0.78 (H) 12/12/2022   Does the Patient currently have chest pain? No    R Recommendations: See Admitting Provider Note  Report given to:   Additional Notes:  Probably will need SNF

## 2023-08-09 NOTE — Progress Notes (Signed)
 Patient had a ciwa score of 19. Patient transferred to ICU for closer monitoring.

## 2023-08-09 NOTE — Progress Notes (Addendum)
 This RN administering, monitoring, & titrating Immune Globulin  per MAR orders.  Patient is tolerating medication without issue. Vitals have been WNL. Patient is fidgety in the bed, A/Ox3, mumbles and talks to self. Will follow simple commands. Text paged Dr. Willette regarding potassium & possibly placing on telemetry. Patient scoring mid teens on CIWA.

## 2023-08-09 NOTE — Progress Notes (Signed)
 PROGRESS NOTE    Patient: Connor Copeland                            PCP: [name], MD                    DOB: Sep 15, 1975            DOA: 08/08/2023 FMW:[id number]             DOS: 08/09/2023, 1:30 PM   LOS: 1 day   Date of Service: The patient was seen and examined on 08/09/2023  Subjective:   The patient was seen and examined this morning. Hemodynamically stable. No issues overnight .  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   ED evaluation: Blood pressure (!) 159/147, pulse (!) 109, temperature 98.9 F (37.2 C), temperature source Oral, resp. rate 18, SpO2 94%.  LABs; sodium 125, potassium 2.6, chloride 78, serum glucose 559, anion gap 21, WBC 10.2, UA unremarkable with exception of greater than 500 glucose Urine drug screen positive for opioids, marijuana        Assessment & Plan:   Principal Problem:   CIDP (chronic inflammatory demyelinating polyneuropathy) (HCC) Active Problems:   Gait disturbance   Alcohol  abuse   HLD (hyperlipidemia)   Hypertriglyceridemia   Peripheral neuropathy   Hyponatremia   Macrocytic anemia   Gout   Seizure disorder (HCC)   Hyperglycemia     Assessment and Plan: * CIDP (chronic inflammatory demyelinating polyneuropathy) (HCC) - Patient seems to have a chronic progressive demyelinating probably radiculopathy/neuropathy -Follows with Dr. Vear as an outpatient and appears the patient is failing high-dose p.o. prednisone  patient is being tapered off -Consulted and discussed with on-call neurologist Dr. Shelton Who agreed the patient can stay 90 pending continue with treatment of  IVIG  -Will continue IVIG and  oxycarbazepine   -Ataxia and weakness he is deemed disabled  Gait disturbance Severe generalized weakness is due to chronic demyelinating radiculopathy/neuropathy -Currently he is deemed disabled -Consulting PT/OT for evaluation and recommendation  Alcohol  abuse - Positive for withdrawal -Will initiate CIWA protocol  Hyperglycemia No history of diabetes mellitus type 2, likely steroid-induced -Will initiate gentle IV fluid hydration -Check CBG q. ACHS, SSI coverage Will check hemoglobin A1c  Seizure disorder (HCC) - Trileptal  will be continued  Gout - Continue home medication as needed colchicine  Macrocytic anemia - Continue stable-monitoring   Hyponatremia Hyponatremia likely due to acute hyperglycemia due to chronic steroid use -Will continue gentle IV fluid hydration normal saline -SIADH workup -Will monitor serum sodium closely  Peripheral neuropathy - Home medications Cymbalta , as needed oxycodone , Ultram , -Per neurology recommendation continue oxcarbazepine    Hypertriglyceridemia Not any medications  HLD (hyperlipidemia) Not on any statins -Will check fasting lipid panel          ------------------------------------------------------------------------------------------------------------------------------------------------  DVT prophylaxis:  heparin  injection 5,000 Units Start: 08/08/23 2200 TED hose Start: 08/08/23 1436 SCDs Start: 08/08/23 1436   Code Status:   Code Status: Full Code  Family Communication: Mother at bedside updated -Advance care planning has been discussed.   Admission status:   Status is: Inpatient Remains inpatient appropriate because: Needing IVIG, taper down  steroids   Disposition: From  - home             Planning for discharge in 1-2 days: to   Procedures:   No admission procedures for hospital encounter.   Antimicrobials:  Anti-infectives (From admission, onward)     None        Medication:   DULoxetine   60 mg Oral Daily   febuxostat   40 mg Oral Daily   folic acid   1 mg Oral Daily   [START ON 08/10/2023] furosemide   40 mg Oral Daily   heparin   5,000 Units Subcutaneous Q8H   insulin  aspart  0-6 Units Subcutaneous TID WC   multivitamin with minerals  1 tablet Oral Daily   nicotine   21 mg Transdermal Daily   nortriptyline   50 mg Oral QHS   Oxcarbazepine   300 mg Oral Daily   OXcarbazepine   600 mg Oral QHS   pantoprazole   40 mg Oral Daily   potassium chloride  SA  40 mEq Oral BID   predniSONE   40 mg Oral Q breakfast   sodium chloride  flush  3 mL Intravenous Q12H   thiamine   100 mg Oral Daily   Or   thiamine   100 mg Intravenous Daily    acetaminophen  **OR** acetaminophen , bisacodyl , hydrALAZINE , HYDROcodone -acetaminophen , HYDROmorphone  (DILAUDID ) injection, ipratropium, levalbuterol , LORazepam  **OR** LORazepam , ondansetron  **OR** ondansetron  (ZOFRAN ) IV, mouth rinse, senna-docusate, sodium phosphate, traMADol , zolpidem    Objective:   Vitals:   08/09/23 0831 08/09/23 0836 08/09/23 1223 08/09/23 1309  BP: (!) 142/85 (!) 142/85 (!) 120/93 100/70  Pulse: (!) 106 (!) 106 (!) 108 (!) 104  Resp:    20  Temp:    (!) 97.5 F (36.4 C)  TempSrc:    Oral  SpO2:  99%  97%  Weight:  107.3 kg    Height:  6' 2 (1.88 m)      Intake/Output Summary (Last 24 hours) at 08/09/2023 1330 Last data filed at 08/09/2023 1300 Gross per 24 hour  Intake 1561.7 ml  Output 300 ml  Net 1261.7 ml   Filed Weights   08/09/23 0836  Weight: 107.3 kg     Physical examination:   Constitution: Somnolent but arousable no distress,  Appears calm and comfortable  Psychiatric:   Normal and stable mood and affect, cognition intact,   HEENT:        Normocephalic, PERRL, otherwise with in Normal limits  Chest:         Chest symmetric Cardio vascular:  S1/S2, RRR, No murmure, No Rubs or Gallops  pulmonary: Clear to auscultation bilaterally, respirations unlabored, negative  wheezes / crackles Abdomen: Soft, non-tender, non-distended, bowel sounds,no masses, no organomegaly Muscular skeletal: Limited exam -severe generalized weaknesses, Subjective poor sensation in lower and upper extremities Neuro: CNII-XII intact. , normal motor and sensation, reflexes intact  Extremities: No pitting edema lower extremities, +2 pulses  Skin: Dry, warm to touch, negative for any Rashes, No open wounds Wounds: per nursing documentation   ------------------------------------------------------------------------------------------------------------------------------------------    LABs:     Latest Ref Rng & Units 08/09/2023    3:36 AM 08/08/2023   12:39 PM 02/03/2023    5:02 AM  CBC  WBC 4.0 - 10.5 K/uL 10.3  10.2  6.6   Hemoglobin 13.0 - 17.0 g/dL 89.1  86.3  87.1   Hematocrit 39.0 - 52.0 % 30.4  37.8  39.0   Platelets 150 - 400 K/uL 300  386  311       Latest Ref Rng &  Units 08/09/2023    3:36 AM 08/08/2023   12:39 PM 02/03/2023    5:02 AM  CMP  Glucose 70 - 99 mg/dL 804  440  88   BUN 6 - 20 mg/dL 15  10  6    Creatinine 0.61 - 1.24 mg/dL 9.32  9.17  9.42   Sodium 135 - 145 mmol/L 127  125    127  135   Potassium 3.5 - 5.1 mmol/L 2.8  2.6  3.5   Chloride 98 - 111 mmol/L 86  78  103   CO2 22 - 32 mmol/L 29  26  22    Calcium  8.9 - 10.3 mg/dL 8.2  9.2  8.2        Micro Results No results found for this or any previous visit (from the past 240 hours).  Radiology Reports No results found.  SIGNED: Adriana DELENA Grams, MD, FHM. FAAFP. Jol[facility] Pack - [facility] Time spent - 35 min.  In seeing, evaluating and examining the patient. Reviewing medical records, labs, drawn plan of care. [facility],  Pager (please use [email or web address] to page/ text) Please use Epic Secure Chat for non-urgent communication (7AM-7PM)  If 7PM-7AM, please contact night-coverage [email or web address], 08/09/2023, 1:30 PM

## 2023-08-09 NOTE — Progress Notes (Signed)
 Currently sitting with patient, thrashing in the bed, fidgeting, pulling at vitals cords. IVIG titrated to max dose per pump guardrails. VSS.

## 2023-08-09 NOTE — Progress Notes (Signed)
   08/09/23 1431  TOC Brief Assessment  Insurance and Status Reviewed  Patient has primary care physician Yes  Home environment has been reviewed Home  Prior level of function: Independent  Prior/Current Home Services No current home services  Social Drivers of Health Review SDOH reviewed no interventions necessary  Readmission risk has been reviewed Yes  Transition of care needs transition of care needs identified, TOC will continue to follow   CSW provided SA education resources in AVS. Pt will be here several days.  TOC will monitor for any DME needs. TOC to follow.

## 2023-08-09 NOTE — Plan of Care (Signed)
  Problem: Acute Rehab PT Goals(only PT should resolve) Goal: Pt Will Go Supine/Side To Sit Flowsheets (Taken 08/09/2023 0929) Pt will go Supine/Side to Sit: Independently Goal: Patient Will Perform Sitting Balance Flowsheets (Taken 08/09/2023 0929) Patient will perform sitting balance: Independently Goal: Patient Will Transfer Sit To/From Stand Flowsheets (Taken 08/09/2023 0929) Patient will transfer sit to/from stand: Independently Goal: Pt Will Perform Standing Balance Or Pre-Gait Flowsheets (Taken 08/09/2023 0929) Pt will perform standing balance or pre-gait: Independently Goal: Pt Will Ambulate Flowsheets (Taken 08/09/2023 0929) Pt will Ambulate:  75 feet  with least restrictive assistive device  with modified independence Goal: Pt Will Go Up/Down Stairs Flowsheets (Taken 08/09/2023 0929) Pt will Go Up / Down Stairs:  Flight  Independently  Omega JONETTA Bottcher PT, DPT [facility] [place] [address] [phone] office

## 2023-08-09 NOTE — ED Notes (Signed)
 Patient transported to CT

## 2023-08-10 ENCOUNTER — Inpatient Hospital Stay (HOSPITAL_COMMUNITY): Payer: 59

## 2023-08-10 DIAGNOSIS — G6181 Chronic inflammatory demyelinating polyneuritis: Secondary | ICD-10-CM | POA: Diagnosis not present

## 2023-08-10 LAB — HEPATIC FUNCTION PANEL
ALT: 50 U/L — ABNORMAL HIGH (ref 0–44)
AST: 57 U/L — ABNORMAL HIGH (ref 15–41)
Albumin: 2.3 g/dL — ABNORMAL LOW (ref 3.5–5.0)
Alkaline Phosphatase: 94 U/L (ref 38–126)
Bilirubin, Direct: 0.4 mg/dL — ABNORMAL HIGH (ref 0.0–0.2)
Indirect Bilirubin: 0.9 mg/dL (ref 0.3–0.9)
Total Bilirubin: 1.3 mg/dL — ABNORMAL HIGH (ref 0.0–1.2)
Total Protein: 5.9 g/dL — ABNORMAL LOW (ref 6.5–8.1)

## 2023-08-10 LAB — BASIC METABOLIC PANEL
Anion gap: 10 (ref 5–15)
BUN: 16 mg/dL (ref 6–20)
CO2: 29 mmol/L (ref 22–32)
Calcium: 8.1 mg/dL — ABNORMAL LOW (ref 8.9–10.3)
Chloride: 91 mmol/L — ABNORMAL LOW (ref 98–111)
Creatinine, Ser: 0.51 mg/dL — ABNORMAL LOW (ref 0.61–1.24)
GFR, Estimated: >60 mL/min (ref >60)
Glucose, Bld: 166 mg/dL — ABNORMAL HIGH (ref 70–99)
Potassium: 2.8 mmol/L — ABNORMAL LOW (ref 3.5–5.1)
Sodium: 130 mmol/L — ABNORMAL LOW (ref 135–145)

## 2023-08-10 LAB — CBC
HCT: 28.4 % — ABNORMAL LOW (ref 39.0–52.0)
Hemoglobin: 10.1 g/dL — ABNORMAL LOW (ref 13.0–17.0)
MCH: 32.4 pg (ref 26.0–34.0)
MCHC: 35.6 g/dL (ref 30.0–36.0)
MCV: 91 fL (ref 80.0–100.0)
Platelets: 260 10*3/uL (ref 150–400)
RBC: 3.12 MIL/uL — ABNORMAL LOW (ref 4.22–5.81)
RDW: 14.4 % (ref 11.5–15.5)
WBC: 4.8 10*3/uL (ref 4.0–10.5)
nRBC: 2.9 % — ABNORMAL HIGH (ref 0.0–0.2)

## 2023-08-10 LAB — MAGNESIUM: Magnesium: 1.7 mg/dL (ref 1.7–2.4)

## 2023-08-10 LAB — GLUCOSE, CAPILLARY
Glucose-Capillary: 153 mg/dL — ABNORMAL HIGH (ref 70–99)
Glucose-Capillary: 145 mg/dL — ABNORMAL HIGH (ref 70–99)
Glucose-Capillary: 148 mg/dL — ABNORMAL HIGH (ref 70–99)

## 2023-08-10 LAB — MRSA NEXT GEN BY PCR, NASAL: MRSA by PCR Next Gen: NOT DETECTED

## 2023-08-10 MED ORDER — FUROSEMIDE 40 MG PO TABS: 40.0000 mg | ORAL_TABLET | Freq: Every day | ORAL | Status: DC

## 2023-08-10 MED ORDER — LACTATED RINGERS IV SOLN: INTRAVENOUS | Status: AC

## 2023-08-10 MED ORDER — CHLORHEXIDINE GLUCONATE CLOTH 2 % EX PADS
6.0000 | MEDICATED_PAD | Freq: Every day | CUTANEOUS | Status: DC
  Administered 2023-08-10 – 2023-08-15 (×6): 6 via TOPICAL

## 2023-08-10 MED ORDER — METHYLPREDNISOLONE SODIUM SUCC 40 MG IJ SOLR
40.0000 mg | INTRAMUSCULAR | Status: DC
  Administered 2023-08-10 – 2023-08-11 (×2): 40 mg via INTRAVENOUS
  Filled 2023-08-10 (×2): qty 1

## 2023-08-10 MED ORDER — POTASSIUM CHLORIDE CRYS ER 20 MEQ PO TBCR: 40.0000 meq | EXTENDED_RELEASE_TABLET | Freq: Once | ORAL | Status: DC

## 2023-08-10 MED ORDER — PANTOPRAZOLE SODIUM 40 MG IV SOLR
40.0000 mg | INTRAVENOUS | Status: DC
  Administered 2023-08-10 – 2023-08-15 (×6): 40 mg via INTRAVENOUS
  Filled 2023-08-10 (×6): qty 10

## 2023-08-10 MED ORDER — POTASSIUM CHLORIDE 10 MEQ/100ML IV SOLN
10.0000 meq | INTRAVENOUS | Status: AC
  Administered 2023-08-10 (×4): 10 meq via INTRAVENOUS
  Filled 2023-08-10 (×4): qty 100

## 2023-08-10 MED ORDER — METHYLPREDNISOLONE SODIUM SUCC 40 MG IJ SOLR: 40.0000 mg | INTRAMUSCULAR | Status: DC

## 2023-08-10 MED ORDER — POTASSIUM CHLORIDE 10 MEQ/50ML IV SOLN: 10.0000 meq | INTRAVENOUS | Status: DC

## 2023-08-10 MED ORDER — LACTATED RINGERS IV BOLUS
1000.0000 mL | Freq: Once | INTRAVENOUS | Status: AC
  Administered 2023-08-10: 1000 mL via INTRAVENOUS

## 2023-08-10 NOTE — Plan of Care (Signed)
  Problem: Safety: Goal: Ability to remain free from injury will improve Outcome: Progressing   Problem: Fluid Volume: Goal: Ability to maintain a balanced intake and output will improve Outcome: Progressing

## 2023-08-10 NOTE — Plan of Care (Signed)
  Problem: Education: Goal: Knowledge of General Education information will improve Description: Including pain rating scale, medication(s)/side effects and non-pharmacologic comfort measures Outcome: Progressing   Problem: Health Behavior/Discharge Planning: Goal: Ability to manage health-related needs will improve Outcome: Progressing   Problem: Clinical Measurements: Goal: Ability to maintain clinical measurements within normal limits will improve Outcome: Progressing Goal: Will remain free from infection Outcome: Progressing Goal: Diagnostic test results will improve Outcome: Progressing Goal: Respiratory complications will improve Outcome: Progressing Goal: Cardiovascular complication will be avoided Outcome: Progressing   Problem: Activity: Goal: Risk for activity intolerance will decrease Outcome: Progressing   Problem: Nutrition: Goal: Adequate nutrition will be maintained Outcome: Progressing   Problem: Coping: Goal: Level of anxiety will decrease Outcome: Progressing   Problem: Elimination: Goal: Will not experience complications related to bowel motility Outcome: Progressing Goal: Will not experience complications related to urinary retention Outcome: Progressing   Problem: Pain Management: Goal: General experience of comfort will improve Outcome: Progressing   Problem: Safety: Goal: Ability to remain free from injury will improve Outcome: Progressing   Problem: Skin Integrity: Goal: Risk for impaired skin integrity will decrease Outcome: Progressing   Problem: Education: Goal: Ability to describe self-care measures that may prevent or decrease complications (Diabetes Survival Skills Education) will improve Outcome: Progressing Goal: Individualized Educational Video(s) Outcome: Progressing   Problem: Coping: Goal: Ability to adjust to condition or change in health will improve Outcome: Progressing   Problem: Fluid Volume: Goal: Ability to  maintain a balanced intake and output will improve Outcome: Progressing   Problem: Health Behavior/Discharge Planning: Goal: Ability to identify and utilize available resources and services will improve Outcome: Progressing Goal: Ability to manage health-related needs will improve Outcome: Progressing   Problem: Metabolic: Goal: Ability to maintain appropriate glucose levels will improve Outcome: Progressing   Problem: Nutritional: Goal: Maintenance of adequate nutrition will improve Outcome: Progressing Goal: Progress toward achieving an optimal weight will improve Outcome: Progressing   Problem: Skin Integrity: Goal: Risk for impaired skin integrity will decrease Outcome: Progressing   Problem: Tissue Perfusion: Goal: Adequacy of tissue perfusion will improve Outcome: Progressing   Problem: Safety: Goal: Non-violent Restraint(s) Outcome: Progressing

## 2023-08-10 NOTE — Progress Notes (Signed)
 PROGRESS NOTE    Patient: Connor Copeland                            PCP: [name], MD                    DOB: 05-15-1976            DOA: 08/08/2023 FMW:[id number]             DOS: 08/10/2023, 1:59 PM   LOS: 2 days   Date of Service: The patient was seen and examined on 08/10/2023  Subjective:   The patient was seen and examined, sedated on the medication. 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   ED evaluation: Blood pressure (!) 159/147, pulse (!) 109, temperature 98.9 F (37.2 C), temperature source Oral, resp. rate 18, SpO2 94%.  LABs; sodium 125, potassium 2.6, chloride 78, serum glucose 559, anion gap 21, WBC 10.2, UA unremarkable with exception of greater than 500 glucose Urine drug screen positive for opioids, marijuana        Assessment & Plan:   Principal Problem:   CIDP (chronic inflammatory demyelinating polyneuropathy) (HCC) Active Problems:   Gait disturbance   Alcohol  abuse   HLD (hyperlipidemia)   Hypertriglyceridemia   Peripheral neuropathy   Hyponatremia   Macrocytic anemia   Gout   Seizure disorder (HCC)   Hyperglycemia   Severe alcohol  withdrawal with perceptual disturbances (HCC)     Assessment and Plan: * CIDP (chronic inflammatory demyelinating polyneuropathy) (HCC) -Continuing IVIG per neurology recommendations -Neurology Dr. Shelton consulted, appreciate close follow-up  - Patient seems to have a chronic progressive demyelinating probably radiculopathy/neuropathy -Follows with Dr. Vear as an outpatient  and appears the patient is failing high-dose p.o. prednisone  patient is being tapered off   -Due to his ataxia and weakness he is deemed disabled  Gait disturbance Severe generalized weakness is due to chronic demyelinating radiculopathy/neuropathy -Currently he is deemed disabled -Consulting PT/OT -appreciate evaluation with patient stable  Alcohol  abuse-with severe DTs -Patient started on Precedex  drip, phenobarbital  -Will initiate CIWA protocol -Transfer to ICU for close observation -Remains sedated at this point-hemodynamically stable  Hyperglycemia No history of diabetes mellitus type 2, likely steroid-induced -Will initiate gentle IV fluid hydration -Check CBG q. ACHS, SSI coverage Will check hemoglobin A1c  Seizure disorder (HCC) No primary formal diagnosis, currently on Lamictal  and Trileptal  will be continued  Gout - Continue home medication as needed colchicine  Macrocytic anemia - Continue stable-monitoring   Hyponatremia Hyponatremia likely due to acute hyperglycemia due to chronic steroid use -Will continue gentle IV fluid hydration normal saline -SIADH workup -Will monitor serum sodium closely  Peripheral neuropathy - Home medications Cymbalta , as needed oxycodone , Ultram , -Per neurology recommendation continue oxcarbazepine  And Lamictal   Hypertriglyceridemia Not any medications  Mild lactic acidosis  -Responded to IV fluids  HLD (hyperlipidemia) Not on any statins -Will check fasting lipid panel     ----------------------------------------------------------------------------------------------------------------------------------------------- Nutritional status:  The patient's BMI is: Body mass index is 28.02 kg/m. I agree with the assessment and plan as outlined ----------------------------------------------------------------------------------------------------------------------------------------------  DVT  prophylaxis:  heparin  injection  5,000 Units Start: 08/08/23 2200 TED hose Start: 08/08/23 1436 SCDs Start: 08/08/23 1436   Code Status:   Code Status: Full Code  Family Communication:  Admission status:   Status is: Inpatient Remains inpatient appropriate because: IVIG, IV Precedex  for severe alcohol  withdrawal   Disposition: From  - home             Planning for discharge in 2-3 days Procedures:   No admission procedures for hospital encounter.   Antimicrobials:  Anti-infectives (From admission, onward)    None        Medication:   Chlorhexidine  Gluconate Cloth  6 each Topical Daily   DULoxetine   60 mg Oral Daily   febuxostat   40 mg Oral Daily   folic acid   1 mg Oral Daily   [START ON 08/11/2023] furosemide   40 mg Oral Daily   heparin   5,000 Units Subcutaneous Q8H   insulin  aspart  0-6 Units Subcutaneous TID WC   multivitamin with minerals  1 tablet Oral Daily   nicotine   21 mg Transdermal Daily   nortriptyline   50 mg Oral QHS   Oxcarbazepine   300 mg Oral Daily   OXcarbazepine   600 mg Oral QHS   pantoprazole   40 mg Oral Daily   PHENObarbital   97.5 mg Intravenous Q8H   Followed by   [START ON 08/11/2023] PHENObarbital   65 mg Intravenous Q8H   Followed by   NOREEN ON 08/13/2023] PHENObarbital   32.5 mg Intravenous Q8H   predniSONE   40 mg Oral Q breakfast   sodium chloride  flush  3 mL Intravenous Q12H    acetaminophen  **OR** acetaminophen , bisacodyl , hydrALAZINE , HYDROcodone -acetaminophen , HYDROmorphone  (DILAUDID ) injection, ipratropium, levalbuterol , LORazepam  **OR** LORazepam , ondansetron  **OR** ondansetron  (ZOFRAN ) IV, mouth rinse, senna-docusate, sodium phosphate, traMADol    Objective:   Vitals:   08/10/23 1136 08/10/23 1200 08/10/23 1230 08/10/23 1300  BP:  117/89 118/83 112/80  Pulse:  99 (!) 103 99  Resp:  (!) 22 (!) 22 (!) 22  Temp: (!) 101.5 F (38.6 C) 100.2 F (37.9 C)    TempSrc: Axillary Oral    SpO2:  100% 100% 100%  Weight:      Height:        Intake/Output Summary (Last  24 hours) at 08/10/2023 1359 Last data filed at 08/10/2023 1130 Gross per 24 hour  Intake 2536.45 ml  Output --  Net 2536.45 ml   Filed Weights   08/09/23 0836 08/10/23 0500  Weight: 107.3 kg 99 kg     Physical examination:   Constitution: Altered mental status, somnolence, encephalopathic on medication Psychiatric: Altered,   HEENT:        Normocephalic, PERRL, otherwise with in Normal limits  Chest:         Chest symmetric Cardio vascular:  S1/S2, RRR, No murmure, No Rubs or Gallops  pulmonary: Clear to auscultation bilaterally, respirations unlabored, negative wheezes / crackles Abdomen: Soft, non-tender, non-distended, bowel sounds,no masses, no organomegaly Muscular skeletal: Limited exam -currently sedated   Neuro: Limited exam patient is altered  extremities: No pitting edema lower extremities, +2 pulses  Skin: Dry, warm to touch, negative for any Rashes, No open wounds    ------------------------------------------------------------------------------------------------------------------------------------------    LABs:     Latest Ref Rng & Units 08/10/2023    6:04 AM 08/09/2023    3:36 AM 08/08/2023   12:39 PM  CBC  WBC 4.0 - 10.5 K/uL 4.8  10.3  10.2   Hemoglobin 13.0 - 17.0 g/dL 89.8  89.1  13.6   Hematocrit 39.0 - 52.0 % 28.4  30.4  37.8   Platelets 150 - 400 K/uL 260  300  386       Latest Ref Rng & Units 08/10/2023    6:04 AM 08/09/2023    9:52 PM 08/09/2023    7:33 PM  CMP  Glucose 70 - 99 mg/dL 833  811  764   BUN 6 - 20 mg/dL 16  17  16    Creatinine 0.61 - 1.24 mg/dL 9.48  9.34  9.33   Sodium 135 - 145 mmol/L 130  129  128   Potassium 3.5 - 5.1 mmol/L 2.8  3.2  3.0   Chloride 98 - 111 mmol/L 91  90  90   CO2 22 - 32 mmol/L 29  29  28    Calcium  8.9 - 10.3 mg/dL 8.1  8.5  8.3   Total Protein 6.5 - 8.1 g/dL 5.9  6.6    Total Bilirubin 0.0 - 1.2 mg/dL 1.3  1.4    Alkaline Phos 38 - 126 U/L 94  113    AST 15 - 41 U/L 57  59    ALT 0 - 44 U/L 50  56          Micro Results Recent Results (from the past 240 hours)  MRSA Next Gen by PCR, Nasal     Status: None   Collection Time: 08/09/23  9:00 AM   Specimen: Nasal Mucosa; Nasal Swab  Result Value Ref Range Status   MRSA by PCR Next Gen NOT DETECTED NOT DETECTED Final    Comment: (NOTE) The GeneXpert MRSA Assay (FDA approved for NASAL specimens only), is one component of a comprehensive MRSA colonization surveillance program. It is not intended to diagnose MRSA infection nor to guide or monitor treatment for MRSA infections. Test performance is not FDA approved in patients less than 81 years old. Performed at [facility], [address], [place], [place] [address]     Radiology Reports CT HEAD WO CONTRAST ( ) Result Date: 08/10/2023 CLINICAL DATA:  Altered mental status, nontraumatic (Ped [id number]). EXAM: CT HEAD WITHOUT CONTRAST TECHNIQUE: Contiguous axial images were obtained from the base of the skull through the vertex without intravenous contrast. RADIATION DOSE REDUCTION: This exam was performed according to the departmental dose-optimization program which includes automated exposure control, adjustment of the mA and/or kV according to patient size and/or use of iterative reconstruction technique. COMPARISON:  CT head June 25, 24. FINDINGS: Brain: No evidence of acute infarction, hemorrhage, hydrocephalus, extra-axial collection or mass lesion/mass effect. Vascular: No hyperdense vessel or unexpected calcification. Skull: No acute fracture. Sinuses/Orbits: Clear sinuses.  No acute orbital findings. Other: No mastoid effusions. IMPRESSION: Stable head CT.  No evidence of acute intracranial abnormality. Electronically Signed   By: Gilmore GORMAN Molt M.D.   On: 08/10/2023 02:21    SIGNED: Adriana DELENA Grams, MD, FHM. FAAFP. Jolynn Pack - [facility] Critical care time spent - 55 min.  In seeing, evaluating and examining the patient. Reviewing medical records, labs, drawn plan of  care. [facility],  Pager (please use [email or web address] to page/ text) Please use Epic Secure Chat for non-urgent communication (7AM-7PM)  If 7PM-7AM, please contact night-coverage [email or web address], 08/10/2023, 1:59 PM

## 2023-08-10 NOTE — Progress Notes (Signed)
 PT Cancellation Note  Patient Details Name: ZIMRI BRENNEN MRN: [id number] DOB: [birth year]   Cancelled Treatment:    Reason Eval/Treat Not Completed: Patient not medically ready  Change in medical status as pt transferred to ICU. Acute Physical Therapy services will need new PT order when medically appropriate per protocol. Signing off on old PT order at this time and will await new PT order.   Omega JONETTA Bottcher 08/10/2023, 10:25 AM

## 2023-08-11 DIAGNOSIS — G6181 Chronic inflammatory demyelinating polyneuritis: Secondary | ICD-10-CM | POA: Diagnosis not present

## 2023-08-11 LAB — GLUCOSE, CAPILLARY
Glucose-Capillary: 296 mg/dL — ABNORMAL HIGH (ref 70–99)
Glucose-Capillary: 199 mg/dL — ABNORMAL HIGH (ref 70–99)
Glucose-Capillary: 171 mg/dL — ABNORMAL HIGH (ref 70–99)
Glucose-Capillary: 155 mg/dL — ABNORMAL HIGH (ref 70–99)
Glucose-Capillary: 108 mg/dL — ABNORMAL HIGH (ref 70–99)

## 2023-08-11 LAB — BASIC METABOLIC PANEL
Anion gap: 10 (ref 5–15)
BUN: 13 mg/dL (ref 6–20)
CO2: 25 mmol/L (ref 22–32)
Calcium: 7.9 mg/dL — ABNORMAL LOW (ref 8.9–10.3)
Chloride: 96 mmol/L — ABNORMAL LOW (ref 98–111)
Creatinine, Ser: 0.64 mg/dL (ref 0.61–1.24)
GFR, Estimated: >60 mL/min (ref >60)
Glucose, Bld: 209 mg/dL — ABNORMAL HIGH (ref 70–99)
Potassium: 3.5 mmol/L (ref 3.5–5.1)
Sodium: 131 mmol/L — ABNORMAL LOW (ref 135–145)

## 2023-08-11 LAB — CBC
HCT: 30.7 % — ABNORMAL LOW (ref 39.0–52.0)
Hemoglobin: 10.2 g/dL — ABNORMAL LOW (ref 13.0–17.0)
MCH: 31 pg (ref 26.0–34.0)
MCHC: 33.2 g/dL (ref 30.0–36.0)
MCV: 93.3 fL (ref 80.0–100.0)
Platelets: 256 10*3/uL (ref 150–400)
RBC: 3.29 MIL/uL — ABNORMAL LOW (ref 4.22–5.81)
RDW: 14.6 % (ref 11.5–15.5)
WBC: 4.1 10*3/uL (ref 4.0–10.5)
nRBC: 3.7 % — ABNORMAL HIGH (ref 0.0–0.2)

## 2023-08-11 LAB — HEMOGLOBIN A1C
Hgb A1c MFr Bld: 8.9 % — ABNORMAL HIGH (ref 4.8–5.6)
Mean Plasma Glucose: 209 mg/dL

## 2023-08-11 MED ORDER — THIAMINE HCL 100 MG/ML IJ SOLN
100.0000 mg | Freq: Every day | INTRAMUSCULAR | Status: DC
  Administered 2023-08-12 – 2023-08-19 (×8): 100 mg via INTRAVENOUS
  Filled 2023-08-11 (×8): qty 2

## 2023-08-11 MED ORDER — THIAMINE HCL 100 MG/ML IJ SOLN
500.0000 mg | Freq: Three times a day (TID) | INTRAVENOUS | Status: AC
  Administered 2023-08-11 (×2): 500 mg via INTRAVENOUS
  Filled 2023-08-11 (×2): qty 5

## 2023-08-11 NOTE — Progress Notes (Signed)
 PROGRESS NOTE    Patient: Connor Copeland                            PCP: [name], MD                    DOB: 09/29/1975            DOA: 08/08/2023 FMW:[id number]             DOS: 08/11/2023, 1:51 PM   LOS: 3 days   Date of Service: The patient was seen and examined on 08/11/2023  Subjective:   The patient was seen and examined this morning, with medication laying in bed comfortably, responding to pain stimuli. 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   ED evaluation: Blood pressure (!) 159/147, pulse (!) 109, temperature 98.9 F (37.2 C), temperature source Oral, resp. rate 18, SpO2 94%.  LABs; sodium 125, potassium 2.6, chloride 78, serum glucose 559, anion gap 21, WBC 10.2, UA unremarkable with exception of greater than 500 glucose Urine drug screen positive for opioids, marijuana        Assessment & Plan:   Principal Problem:   CIDP (chronic inflammatory demyelinating polyneuropathy) (HCC) Active Problems:   Gait disturbance   Alcohol  abuse   HLD (hyperlipidemia)   Hypertriglyceridemia   Peripheral neuropathy   Hyponatremia   Macrocytic anemia   Gout   Seizure disorder (HCC)   Hyperglycemia   Severe alcohol  withdrawal with perceptual disturbances (HCC)     Assessment and Plan: * CIDP (chronic inflammatory demyelinating polyneuropathy) (HCC) -Continuing IVIG per neurology recommendations -Neurology Dr. Shelton consulted, appreciate close follow-up  - Patient seems to have a chronic progressive demyelinating probably  radiculopathy/neuropathy -Follows with Dr. Vear as an outpatient and appears the patient is failing high-dose p.o. prednisone  patient is being tapered off   -Due to his ataxia and weakness he is deemed disabled  Alcohol  abuse - with severe DTs -Patient started on Precedex  drip, phenobarbital ,  -CIWA protocol -Will continue with neurochecks, close observation in ICU setting -Remains sedated at this point-hemodynamically stable     Gait disturbance Severe generalized weakness is due to chronic demyelinating radiculopathy/neuropathy -Currently he is deemed disabled -Consulting PT/OT -appreciate evaluation with patient stable  Hyperglycemia No history of diabetes mellitus type 2, likely steroid-induced -Will initiate gentle IV fluid hydration -Check CBG q. ACHS, SSI coverage Will check hemoglobin A1c  Seizure disorder (HCC) No primary formal diagnosis, currently on Lamictal  and Trileptal  will be continued  Gout - Continue home medication as needed colchicine  Macrocytic anemia - Continue stable-monitoring   Hyponatremia Hyponatremia likely due to acute hyperglycemia due to chronic steroid use -Will continue gentle IV fluid hydration normal saline -SIADH workup -Will monitor serum sodium closely  Peripheral neuropathy - Home medications Cymbalta , as needed oxycodone , Ultram , -Per neurology recommendation continue oxcarbazepine  And Lamictal   Hypertriglyceridemia Not any medications  Mild lactic acidosis  -Responded to IV fluids  HLD (hyperlipidemia) Not on any statins -Will check fasting lipid panel     ----------------------------------------------------------------------------------------------------------------------------------------------- Nutritional status:  The patient's BMI is: Body mass index  is 30.71 kg/m. I agree with the assessment and plan as outlined  ----------------------------------------------------------------------------------------------------------------------------------------------  DVT prophylaxis:  heparin  injection 5,000 Units Start: 08/08/23 2200 TED hose Start: 08/08/23 1436 SCDs Start: 08/08/23 1436   Code Status:   Code Status: Full Code  Family Communication:  Admission status:   Status is: Inpatient Remains inpatient appropriate because: IVIG, IV Precedex  for severe alcohol  withdrawal   Disposition: From  - home             Planning for discharge in 2-3 days Procedures:   No admission procedures for hospital encounter.   Antimicrobials:  Anti-infectives (From admission, onward)    None        Medication:   Chlorhexidine  Gluconate Cloth  6 each Topical Daily   DULoxetine   60 mg Oral Daily   febuxostat   40 mg Oral Daily   folic acid   1 mg Oral Daily   heparin   5,000 Units Subcutaneous Q8H   insulin  aspart  0-6 Units Subcutaneous TID WC   methylPREDNISolone  (SOLU-MEDROL ) injection  40 mg Intravenous Q24H   multivitamin with minerals  1 tablet Oral Daily   nicotine   21 mg Transdermal Daily   nortriptyline   50 mg Oral QHS   Oxcarbazepine   300 mg Oral Daily   OXcarbazepine   600 mg Oral QHS   pantoprazole  (PROTONIX ) IV  40 mg Intravenous Q24H   PHENObarbital   97.5 mg Intravenous Q8H   Followed by   PHENObarbital   65 mg Intravenous Q8H   Followed by   NOREEN ON 08/13/2023] PHENObarbital   32.5 mg Intravenous Q8H   sodium chloride  flush  3 mL Intravenous Q12H   [START ON 08/12/2023] thiamine  (VITAMIN B1) injection  100 mg Intravenous Daily    acetaminophen  **OR** acetaminophen , bisacodyl , hydrALAZINE , HYDROcodone -acetaminophen , HYDROmorphone  (DILAUDID ) injection, ipratropium, levalbuterol , LORazepam  **OR** LORazepam , ondansetron  **OR** ondansetron  (ZOFRAN ) IV, mouth rinse, senna-docusate, sodium phosphate, traMADol    Objective:   Vitals:   08/11/23 0900 08/11/23 1000 08/11/23 1100 08/11/23 1132   BP: (!) 157/104 (!) 140/101 (!) 140/95   Pulse: 81 (!) 55 87   Resp: (!) 21 16 (!) 21   Temp:    97.9 F (36.6 C)  TempSrc:    Axillary  SpO2: 100% 100% 100%   Weight:      Height:        Intake/Output Summary (Last 24 hours) at 08/11/2023 1351 Last data filed at 08/11/2023 1101 Gross per 24 hour  Intake 1278.17 ml  Output 675 ml  Net 603.17 ml   Filed Weights   08/09/23 0836 08/10/23 0500 08/11/23 0500  Weight: 107.3 kg 99 kg 108.5 kg     Physical examination:   Constitution: Altered mental status, somnolence, encephalopathic on medication General:  Somnolent, sleeping comfortably  HEENT:  Normocephalic, PERRL, otherwise with in Normal limits   Neuro:  Limited exam patient is somnolent   Lungs:   Clear to auscultation BL, Respirations unlabored,  No wheezes / crackles  Cardio:    S1/S2, RRR, No murmure, No Rubs or Gallops   Abdomen:  Soft, non-tender, bowel sounds active all four quadrants, no guarding or peritoneal signs.  Muscular  skeletal:  Limited exam -global generalized weaknesses - in bed, able to move all 4 extremities,   2+ pulses,  symmetric, No pitting edema  Skin:  Dry, warm to touch, negative for any Rashes,  Wounds: Please see nursing documentation          ------------------------------------------------------------------------------------------------------------------------------------------    LABs:     Latest Ref  Rng & Units 08/11/2023    4:26 AM 08/10/2023    6:04 AM 08/09/2023    3:36 AM  CBC  WBC 4.0 - 10.5 K/uL 4.1  4.8  10.3   Hemoglobin 13.0 - 17.0 g/dL 89.7  89.8  89.1   Hematocrit 39.0 - 52.0 % 30.7  28.4  30.4   Platelets 150 - 400 K/uL 256  260  300       Latest Ref Rng & Units 08/11/2023    4:26 AM 08/10/2023    6:04 AM 08/09/2023    9:52 PM  CMP  Glucose 70 - 99 mg/dL 790  833  811   BUN 6 - 20 mg/dL 13  16  17    Creatinine 0.61 - 1.24 mg/dL 9.35  9.48  9.34   Sodium 135 - 145 mmol/L 131  130  129   Potassium 3.5 - 5.1 mmol/L  3.5  2.8  3.2   Chloride 98 - 111 mmol/L 96  91  90   CO2 22 - 32 mmol/L 25  29  29    Calcium  8.9 - 10.3 mg/dL 7.9  8.1  8.5   Total Protein 6.5 - 8.1 g/dL  5.9  6.6   Total Bilirubin 0.0 - 1.2 mg/dL  1.3  1.4   Alkaline Phos 38 - 126 U/L  94  113   AST 15 - 41 U/L  57  59   ALT 0 - 44 U/L  50  56        Micro Results Recent Results (from the past 240 hours)  MRSA Next Gen by PCR, Nasal     Status: None   Collection Time: 08/09/23  9:00 AM   Specimen: Nasal Mucosa; Nasal Swab  Result Value Ref Range Status   MRSA by PCR Next Gen NOT DETECTED NOT DETECTED Final    Comment: (NOTE) The GeneXpert MRSA Assay (FDA approved for NASAL specimens only), is one component of a comprehensive MRSA colonization surveillance program. It is not intended to diagnose MRSA infection nor to guide or monitor treatment for MRSA infections. Test performance is not FDA approved in patients less than 35 years old. Performed at [facility], [address], [place], [place] [address]     Radiology Reports No results found.   SIGNED: Adriana DELENA Grams, MD, FHM. FAAFP. [facility] - [facility] Critical care time spent - 55 min.  In seeing, evaluating and examining the patient. Reviewing medical records, labs, drawn plan of care. [facility],  Pager (please use [email or web address] to page/ text) Please use Epic Secure Chat for non-urgent communication (7AM-7PM)  If 7PM-7AM, please contact night-coverage [email or web address], 08/11/2023, 1:51 PM

## 2023-08-11 NOTE — TOC Progression Note (Signed)
 Transition of Care [facility]) - Progression Note    Patient Details  Name: Connor Copeland MRN: [id number] Date of Birth: 05/30/1976  [facility] CM/SW Contact  Sharlyne Stabs, RN Phone Number: 08/11/2023, 12:09 PM  Clinical Narrative:    CM attempted to reach patient to discuss PT eval. Per RN patient is disoriented, in restraints on precedex . TOC following.      Barriers to Discharge: Continued Medical Work up  Expected Discharge Plan and Services        Social Determinants of Health (SDOH) Interventions SDOH Screenings   Food Insecurity: No Food Insecurity (08/08/2023)  Housing: Low Risk  (08/08/2023)  Transportation Needs: No Transportation Needs (08/08/2023)  Utilities: Not At Risk (08/08/2023)  Tobacco Use: High Risk (08/09/2023)    Readmission Risk Interventions     No data to display

## 2023-08-11 NOTE — Plan of Care (Signed)
  Problem: Education: Goal: Knowledge of General Education information will improve Description: Including pain rating scale, medication(s)/side effects and non-pharmacologic comfort measures Outcome: Progressing   Problem: Health Behavior/Discharge Planning: Goal: Ability to manage health-related needs will improve Outcome: Progressing   Problem: Clinical Measurements: Goal: Ability to maintain clinical measurements within normal limits will improve Outcome: Progressing Goal: Will remain free from infection Outcome: Progressing Goal: Diagnostic test results will improve Outcome: Progressing Goal: Respiratory complications will improve Outcome: Progressing Goal: Cardiovascular complication will be avoided Outcome: Progressing   Problem: Activity: Goal: Risk for activity intolerance will decrease Outcome: Progressing   Problem: Nutrition: Goal: Adequate nutrition will be maintained Outcome: Progressing   Problem: Coping: Goal: Level of anxiety will decrease Outcome: Progressing   Problem: Elimination: Goal: Will not experience complications related to bowel motility Outcome: Progressing Goal: Will not experience complications related to urinary retention Outcome: Progressing   Problem: Pain Management: Goal: General experience of comfort will improve Outcome: Progressing   Problem: Safety: Goal: Ability to remain free from injury will improve Outcome: Progressing   Problem: Skin Integrity: Goal: Risk for impaired skin integrity will decrease Outcome: Progressing   Problem: Education: Goal: Ability to describe self-care measures that may prevent or decrease complications (Diabetes Survival Skills Education) will improve Outcome: Progressing Goal: Individualized Educational Video(s) Outcome: Progressing   Problem: Coping: Goal: Ability to adjust to condition or change in health will improve Outcome: Progressing   Problem: Fluid Volume: Goal: Ability to  maintain a balanced intake and output will improve Outcome: Progressing   Problem: Health Behavior/Discharge Planning: Goal: Ability to identify and utilize available resources and services will improve Outcome: Progressing Goal: Ability to manage health-related needs will improve Outcome: Progressing   Problem: Metabolic: Goal: Ability to maintain appropriate glucose levels will improve Outcome: Progressing   Problem: Nutritional: Goal: Maintenance of adequate nutrition will improve Outcome: Progressing Goal: Progress toward achieving an optimal weight will improve Outcome: Progressing   Problem: Skin Integrity: Goal: Risk for impaired skin integrity will decrease Outcome: Progressing   Problem: Tissue Perfusion: Goal: Adequacy of tissue perfusion will improve Outcome: Progressing   Problem: Safety: Goal: Non-violent Restraint(s) Outcome: Progressing

## 2023-08-11 NOTE — Progress Notes (Signed)
 OT Cancellation Note  Patient Details Name: EPHREM CARRICK MRN: [id number] DOB: 11/01/1975   Cancelled Treatment:    Reason Eval/Treat Not Completed: Patient not medically ready. Pt with further EtOh withdrawal symptoms, upgraded to ICU. He will need new OT orders prior to evaluation. Acute OT signing off at this time and will await new orders.   Valentin Nightingale, OTR/L [facility] Kalman Nylen Elane Nightingale 08/11/2023, 11:57 AM

## 2023-08-12 DIAGNOSIS — G6181 Chronic inflammatory demyelinating polyneuritis: Secondary | ICD-10-CM | POA: Diagnosis not present

## 2023-08-12 LAB — BASIC METABOLIC PANEL
Anion gap: 10 (ref 5–15)
BUN: 10 mg/dL (ref 6–20)
CO2: 27 mmol/L (ref 22–32)
Calcium: 7.7 mg/dL — ABNORMAL LOW (ref 8.9–10.3)
Chloride: 93 mmol/L — ABNORMAL LOW (ref 98–111)
Creatinine, Ser: 0.5 mg/dL — ABNORMAL LOW (ref 0.61–1.24)
GFR, Estimated: >60 mL/min (ref >60)
Glucose, Bld: 172 mg/dL — ABNORMAL HIGH (ref 70–99)
Potassium: 3.1 mmol/L — ABNORMAL LOW (ref 3.5–5.1)
Sodium: 130 mmol/L — ABNORMAL LOW (ref 135–145)

## 2023-08-12 LAB — CBC
HCT: 31.8 % — ABNORMAL LOW (ref 39.0–52.0)
Hemoglobin: 10.4 g/dL — ABNORMAL LOW (ref 13.0–17.0)
MCH: 30.6 pg (ref 26.0–34.0)
MCHC: 32.7 g/dL (ref 30.0–36.0)
MCV: 93.5 fL (ref 80.0–100.0)
Platelets: 235 10*3/uL (ref 150–400)
RBC: 3.4 MIL/uL — ABNORMAL LOW (ref 4.22–5.81)
RDW: 15 % (ref 11.5–15.5)
WBC: 4 10*3/uL (ref 4.0–10.5)
nRBC: 1.2 % — ABNORMAL HIGH (ref 0.0–0.2)

## 2023-08-12 LAB — GLUCOSE, CAPILLARY
Glucose-Capillary: 151 mg/dL — ABNORMAL HIGH (ref 70–99)
Glucose-Capillary: 109 mg/dL — ABNORMAL HIGH (ref 70–99)
Glucose-Capillary: 109 mg/dL — ABNORMAL HIGH (ref 70–99)
Glucose-Capillary: 175 mg/dL — ABNORMAL HIGH (ref 70–99)

## 2023-08-12 MED ORDER — POTASSIUM CHLORIDE 2 MEQ/ML IV SOLN
INTRAVENOUS | Status: AC
Start: 2023-08-12 — End: 2023-08-12
  Filled 2023-08-12: qty 1000

## 2023-08-12 MED ORDER — PREDNISONE 20 MG PO TABS: 20.0000 mg | ORAL_TABLET | Freq: Every day | ORAL | Status: DC

## 2023-08-12 MED ORDER — PREDNISONE 10 MG PO TABS: 10.0000 mg | ORAL_TABLET | Freq: Every day | ORAL | Status: DC

## 2023-08-12 MED ORDER — METFORMIN HCL 500 MG PO TABS: 500.0000 mg | ORAL_TABLET | Freq: Every day | ORAL | Status: DC

## 2023-08-12 MED ORDER — PREDNISONE 20 MG PO TABS
30.0000 mg | ORAL_TABLET | Freq: Every day | ORAL | Status: DC
  Administered 2023-08-17 – 2023-08-19 (×3): 30 mg via ORAL
  Filled 2023-08-12 (×3): qty 1

## 2023-08-12 MED ORDER — METHYLPREDNISOLONE SODIUM SUCC 40 MG IJ SOLR
40.0000 mg | INTRAMUSCULAR | Status: AC
  Administered 2023-08-12 – 2023-08-16 (×5): 40 mg via INTRAVENOUS
  Filled 2023-08-12 (×5): qty 1

## 2023-08-12 MED ORDER — POLYVINYL ALCOHOL 1.4 % OP SOLN: 1.0000 [drp] | OPHTHALMIC | Status: DC | PRN

## 2023-08-12 NOTE — Plan of Care (Signed)
  Problem: Education: Goal: Knowledge of General Education information will improve Description: Including pain rating scale, medication(s)/side effects and non-pharmacologic comfort measures Outcome: Progressing   Problem: Health Behavior/Discharge Planning: Goal: Ability to manage health-related needs will improve Outcome: Progressing   Problem: Clinical Measurements: Goal: Ability to maintain clinical measurements within normal limits will improve Outcome: Progressing Goal: Will remain free from infection Outcome: Progressing Goal: Diagnostic test results will improve Outcome: Progressing Goal: Respiratory complications will improve Outcome: Progressing Goal: Cardiovascular complication will be avoided Outcome: Progressing   Problem: Activity: Goal: Risk for activity intolerance will decrease Outcome: Progressing   Problem: Nutrition: Goal: Adequate nutrition will be maintained Outcome: Progressing   Problem: Coping: Goal: Level of anxiety will decrease Outcome: Progressing   Problem: Elimination: Goal: Will not experience complications related to bowel motility Outcome: Progressing Goal: Will not experience complications related to urinary retention Outcome: Progressing   Problem: Pain Management: Goal: General experience of comfort will improve Outcome: Progressing   Problem: Safety: Goal: Ability to remain free from injury will improve Outcome: Progressing   Problem: Education: Goal: Ability to describe self-care measures that may prevent or decrease complications (Diabetes Survival Skills Education) will improve Outcome: Progressing Goal: Individualized Educational Video(s) Outcome: Progressing   Problem: Coping: Goal: Ability to adjust to condition or change in health will improve Outcome: Progressing   Problem: Health Behavior/Discharge Planning: Goal: Ability to identify and utilize available resources and services will improve Outcome:  Progressing Goal: Ability to manage health-related needs will improve Outcome: Progressing   Problem: Metabolic: Goal: Ability to maintain appropriate glucose levels will improve Outcome: Progressing   Problem: Nutritional: Goal: Maintenance of adequate nutrition will improve Outcome: Progressing Goal: Progress toward achieving an optimal weight will improve Outcome: Progressing   Problem: Skin Integrity: Goal: Risk for impaired skin integrity will decrease Outcome: Progressing   Problem: Tissue Perfusion: Goal: Adequacy of tissue perfusion will improve Outcome: Progressing   Problem: Safety: Goal: Non-violent Restraint(s) Outcome: Progressing

## 2023-08-12 NOTE — Progress Notes (Signed)
 PROGRESS NOTE    Patient: Connor Copeland                            PCP: [name], MD                    DOB: 11-21-1975            DOA: 08/08/2023 FMW:[id number]             DOS: 08/12/2023, 1:48 PM   LOS: 4 days   Date of Service: The patient was seen and examined on 08/12/2023  Subjective:   The patient was seen and examined this morning, with medication laying in bed comfortably, responding to pain stimuli. 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   ED evaluation: Blood pressure (!) 159/147, pulse (!) 109, temperature 98.9 F (37.2 C), temperature source Oral, resp. rate 18, SpO2 94%.  LABs; sodium 125, potassium 2.6, chloride 78, serum glucose 559, anion gap 21, WBC 10.2, UA unremarkable with exception of greater than 500 glucose Urine drug screen positive for opioids, marijuana      Assessment & Plan:   Principal Problem:   CIDP (chronic inflammatory demyelinating polyneuropathy) (HCC) Active Problems:   Gait disturbance   Alcohol  abuse   HLD (hyperlipidemia)   Hypertriglyceridemia   Peripheral neuropathy   Hyponatremia   Macrocytic anemia   Gout   Seizure disorder (HCC)   Hyperglycemia   Severe alcohol  withdrawal with perceptual disturbances (HCC)     Assessment and Plan: * CIDP (chronic inflammatory demyelinating polyneuropathy) (HCC) -Continuing IVIG per neurology recommendations for total of 5 days ending 08/13/2023 Further treatment or recommendation per neurology  -Neurology Dr. Shelton consulted, following closely  -  Patient seems to have a chronic progressive demyelinating probably radiculopathy/neuropathy -Follows with Dr. Vear as an outpatient and appears the patient is failing high-dose p.o. prednisone  patient is being tapered off--- positioning off steroids (which was switched to IV due to his poor p.o. intake)   -Due to his ataxia and weakness he is deemed disabled -fortunately he does not have insurance he has to return back home  Alcohol  abuse - with severe DTs Significant DTs, responding well-improving -On Precedex  drip, phenobarbital ... Tapering off -CIWA protocol -Will continue with neurochecks,  -Remains somnolent but arousable     Gait disturbance Severe generalized weakness is due to chronic demyelinating radiculopathy/neuropathy -Currently he is deemed disabled -S/p evaluation by PT -unfortunately does not have insurance for SNF or home health  DM II- Hyperglycemia (new diagnosis of DMII) No previous history of diabetes mellitus type 2,  Steroids induced hyperglycemia - DM  (need to check CBG and A1c in 3 months off steroids) -Status post IVF -Check CBG q. ACHS, SSI coverage Hemoglobin A1c: 8.9 CBG (last 3)  Recent Labs    08/12/23 0724 08/12/23 1145 08/12/23 1554  GLUCAP 151* 109* 109*   -Patient may need Diabetic medication upon discharge Diabetic education-diabetic coordinator consult  Seizure disorder (HCC) No primary formal diagnosis, currently on Lamictal  and Trileptal  will be continued  Gout - Continue home medication as needed colchicine  Macrocytic anemia - Continue stable-monitoring   Hyponatremia Hyponatremia likely due to acute hyperglycemia due to chronic steroid use -Will continue gentle IV fluid hydration normal saline -SIADH workup -Will monitor serum sodium closely  Peripheral neuropathy - Home medications Cymbalta , as needed oxycodone , Ultram , -Per neurology recommendation continue oxcarbazepine  And Lamictal   Hypertriglyceridemia Not  any medications  Mild lactic acidosis  -Responded to IV fluids  HLD (hyperlipidemia) Not on any statins -Will check fasting lipid panel     ----------------------------------------------------------------------------------------------------------------------------------------------- Nutritional status:  The patient's BMI is: Body mass index is 30.82 kg/m. I agree with the assessment and plan as outlined ----------------------------------------------------------------------------------------------------------------------------------------------  DVT prophylaxis:  heparin  injection 5,000 Units Start: 08/08/23 2200 TED hose Start: 08/08/23 1436 SCDs Start: 08/08/23 1436   Code Status:   Code Status: Full Code  Family Communication:  Admission status:   Status is: Inpatient Remains inpatient appropriate because: IVIG, IV Precedex  for severe alcohol  withdrawal   Disposition: From  - home             Planning for discharge in 2-3 days Procedures:   No admission procedures for hospital encounter.   Antimicrobials:  Anti-infectives (From admission, onward)    None        Medication:   Chlorhexidine  Gluconate Cloth  6 each Topical Daily   DULoxetine   60 mg Oral Daily   febuxostat   40 mg Oral Daily   folic acid   1 mg Oral Daily   heparin   5,000 Units Subcutaneous Q8H   insulin  aspart  0-6 Units Subcutaneous TID WC   methylPREDNISolone  (SOLU-MEDROL ) injection  40 mg Intravenous Q24H   multivitamin with minerals  1 tablet Oral Daily   nicotine   21 mg Transdermal Daily   nortriptyline   50 mg Oral QHS   Oxcarbazepine   300 mg Oral Daily   OXcarbazepine   600 mg Oral QHS   pantoprazole  (PROTONIX ) IV  40 mg Intravenous Q24H   PHENObarbital   65 mg Intravenous Q8H   Followed by   NOREEN ON 08/13/2023] PHENObarbital   32.5 mg Intravenous Q8H   sodium chloride  flush  3 mL Intravenous Q12H   thiamine  (VITAMIN B1) injection  100 mg Intravenous Daily    acetaminophen  **OR**  acetaminophen , bisacodyl , hydrALAZINE , HYDROcodone -acetaminophen , HYDROmorphone  (DILAUDID ) injection, ipratropium, levalbuterol , ondansetron  **OR** ondansetron  (ZOFRAN ) IV, mouth rinse, senna-docusate, sodium phosphate, traMADol    Objective:   Vitals:   08/12/23 0800 08/12/23 0900 08/12/23 1000 08/12/23 1100  BP: 103/84 102/75 106/79   Pulse: 83 93 88   Resp: 15 14 17    Temp:    97.9 F (36.6 C)  TempSrc:    Oral  SpO2: 100% 100% 100%   Weight:      Height:        Intake/Output Summary (Last 24 hours) at 08/12/2023 1348 Last data filed at 08/12/2023 0643 Gross per 24 hour  Intake 190.41 ml  Output 1800 ml  Net -1609.59 ml   Filed Weights   08/10/23 0500 08/11/23 0500 08/12/23 0500  Weight: 99 kg 108.5 kg 108.9 kg     Physical examination:   General:  Somnolent, responding to pain stimuli  HEENT:  Normocephalic, PERRL, otherwise with in Normal limits   Neuro:  Somnolent-Limited exam  Sensation and motor intact, responding to touch and pain stimuli Moving all extremities spontaneously reflexes intact   Lungs:   Clear to auscultation BL, Respirations unlabored,  No wheezes / crackles  Cardio:    S1/S2, RRR, No murmure, No Rubs or Gallops   Abdomen:  Soft,  non-tender, bowel sounds active all four quadrants, no guarding or peritoneal signs.  Muscular  skeletal:  Limited exam -global generalized weaknesses - in bed, able to move all 4 extremities,   2+ pulses,  symmetric, No pitting edema  Skin:  Dry, warm to touch, negative for any Rashes,  Wounds: Please see nursing documentation              ------------------------------------------------------------------------------------------------------------------------------------------    LABs:     Latest Ref Rng & Units 08/12/2023    5:03 AM 08/11/2023    4:26 AM 08/10/2023    6:04 AM  CBC  WBC 4.0 - 10.5 K/uL 4.0  4.1  4.8   Hemoglobin 13.0 - 17.0 g/dL 89.5  89.7  89.8   Hematocrit 39.0 - 52.0 % 31.8  30.7  28.4    Platelets 150 - 400 K/uL 235  256  260       Latest Ref Rng & Units 08/12/2023    5:03 AM 08/11/2023    4:26 AM 08/10/2023    6:04 AM  CMP  Glucose 70 - 99 mg/dL 827  790  833   BUN 6 - 20 mg/dL 10  13  16    Creatinine 0.61 - 1.24 mg/dL 9.49  9.35  9.48   Sodium 135 - 145 mmol/L 130  131  130   Potassium 3.5 - 5.1 mmol/L 3.1  3.5  2.8   Chloride 98 - 111 mmol/L 93  96  91   CO2 22 - 32 mmol/L 27  25  29    Calcium  8.9 - 10.3 mg/dL 7.7  7.9  8.1   Total Protein 6.5 - 8.1 g/dL   5.9   Total Bilirubin 0.0 - 1.2 mg/dL   1.3   Alkaline Phos 38 - 126 U/L   94   AST 15 - 41 U/L   57   ALT 0 - 44 U/L   50        Micro Results Recent Results (from the past 240 hours)  MRSA Next Gen by PCR, Nasal     Status: None   Collection Time: 08/09/23  9:00 AM   Specimen: Nasal Mucosa; Nasal Swab  Result Value Ref Range Status   MRSA by PCR Next Gen NOT DETECTED NOT DETECTED Final    Comment: (NOTE) The GeneXpert MRSA Assay (FDA approved for NASAL specimens only), is one component of a comprehensive MRSA colonization surveillance program. It is not intended to diagnose MRSA infection nor to guide or monitor treatment for MRSA infections. Test performance is not FDA approved in patients less than 59 years old. Performed at [facility], [address] [place], [place] [address]     Radiology Reports No results found.   SIGNED: Adriana DELENA Grams, MD, FHM. FAAFP. Jolynn Pack - [facility] Critical care time spent - 55 min.  In seeing, evaluating and examining the patient. Reviewing medical records, labs, drawn plan of care. [facility],  Pager (please use [email or web address] to page/ text) Please use Epic Secure Chat for non-urgent communication (7AM-7PM)  If 7PM-7AM, please contact night-coverage [email or web address], 08/12/2023, 1:48 PM

## 2023-08-12 NOTE — Progress Notes (Signed)
 I connected with  Connor Copeland on 08/12/23 by a video enabled telemedicine application and verified that I am speaking with the correct person using two identifiers.   I discussed the limitations of evaluation and management by telemedicine. The patient was drowsy and unable to consent  Location of patient: [facility] Location of physician: [facility]   Subjective: Over the weekend, patient was noted to be in alcohol  withdrawal and moved to ICU, started on Precedex  and phenobarbital .  He will be completing his last dose of IVIG later today.   MND:Lwjaoz to obtain due to poor mental status   Examination  Vital signs in last 24 hours: Temp:  [97.7 F (36.5 C)-98.4 F (36.9 C)] 98.1 F (36.7 C) (01/07 0700) Pulse Rate:  [73-96] 88 (01/07 1000) Resp:  [14-23] 17 (01/07 1000) BP: (102-167)/(75-118) 106/79 (01/07 1000) SpO2:  [99 %-100 %] 100 % (01/07 1000) Weight:  [108.9 kg] 108.9 kg (01/07 0500)  General: lying in bed, NAD Neuro: Very drowsy, did open his eyes to verbal stimulation, told me his name, location: [place], Did not follow other commands.  Did appear to be spontaneously moving all 4 extremities in bed with antigravity strength  Basic Metabolic Panel: Recent Labs  Lab 08/08/23 1239 08/09/23 0336 08/09/23 1933 08/09/23 2152 08/10/23 0604 08/10/23 0805 08/11/23 0426 08/12/23 0503  NA 127*  125*   < > 128* 129* 130*  --  131* 130*  K 2.6*   < > 3.0* 3.2* 2.8*  --  3.5 3.1*  CL 78*   < > 90* 90* 91*  --  96* 93*  CO2 26   < > 28 29 29   --  25 27  GLUCOSE 559*   < > 235* 188* 166*  --  209* 172*  BUN 10   < > 16 17 16   --  13 10  CREATININE 0.82   < > 0.66 0.65 0.51*  --  0.64 0.50*  CALCIUM  9.2   < > 8.3* 8.5* 8.1*  --  7.9* 7.7*  MG 1.7  --   --   --   --  1.7  --   --   PHOS 2.7  --   --   --   --   --   --   --    < > = values in this interval not displayed.    CBC: Recent Labs  Lab 08/08/23 1239 08/09/23 0336 08/10/23 0604  08/11/23 0426 08/12/23 0503  WBC 10.2 10.3 4.8 4.1 4.0  NEUTROABS 5.9  --   --   --   --   HGB 13.6 10.8* 10.1* 10.2* 10.4*  HCT 37.8* 30.4* 28.4* 30.7* 31.8*  MCV 88.1 87.9 91.0 93.3 93.5  PLT 386 300 260 256 235     Coagulation Studies: No results for input(s): LABPROT, INR in the last 72 hours.  Imaging No new brain imaging overnight   ASSESSMENT AND PLAN: 48yo M with CIDP, worsening weakness and falls.   CIDP, worsening - continue prednisone  taper, on 40mg  currently, taper  by 10mg  every week to stop - On IVIG 400mg /kg 5/5 today - Resume home meds for symptomatic management of neuropathy - PT/OT -Discussed plan with Dr. Willette via secure chat -Continue to follow-up with Dr. Pamela as outpatient  Alcohol  use disorder with alcohol  withdrawal -Currently on Precedex  and phenobarbital  taper -It highly likely that this is also contributing to patient's weakness and falls. -Counseling about alcohol  cessation when  more awake   Thank you for allowing us  to participate in the care of this patient. If you have any further questions, please contact  me or neurohospitalist.    I have spent a total of  26  minutes with the patient reviewing hospital notes,  test results, labs and examining the patient as well as establishing an assessment and plan.  > 50% of time was spent in direct patient care.        Arlin Krebs Epilepsy [facility] For questions after 5pm please refer to AMION to reach the Neurologist on call

## 2023-08-12 NOTE — TOC Progression Note (Signed)
 Transition of Care [facility]) - Progression Note    Patient Details  Name: Connor Copeland MRN: [id number] Date of Birth: 1976-08-01  [facility]) CM/SW Contact  Sharlyne Stabs, RN Phone Number: 08/12/2023, 1:45 PM  Clinical Narrative:   WONDA called [employer] to check benefits for SNF.  Patient does not have Rehab benefits for in or out of network.  CM called his mother to explain. Team updated. Patient is not medically stable for discharge.     Expected Discharge Plan: Home/Self Care Barriers to Discharge: Continued Medical Work up  Expected Discharge Plan and Services     Social Determinants of Health (SDOH) Interventions SDOH Screenings   Food Insecurity: No Food Insecurity (08/08/2023)  Housing: Low Risk  (08/08/2023)  Transportation Needs: No Transportation Needs (08/08/2023)  Utilities: Not At Risk (08/08/2023)  Tobacco Use: High Risk (08/09/2023)    Readmission Risk Interventions     No data to display

## 2023-08-13 DIAGNOSIS — I471 Supraventricular tachycardia, unspecified: Secondary | ICD-10-CM | POA: Diagnosis not present

## 2023-08-13 DIAGNOSIS — G6181 Chronic inflammatory demyelinating polyneuritis: Secondary | ICD-10-CM | POA: Diagnosis not present

## 2023-08-13 DIAGNOSIS — G40909 Epilepsy, unspecified, not intractable, without status epilepticus: Secondary | ICD-10-CM | POA: Diagnosis not present

## 2023-08-13 DIAGNOSIS — F1010 Alcohol abuse, uncomplicated: Secondary | ICD-10-CM

## 2023-08-13 LAB — GLUCOSE, CAPILLARY
Glucose-Capillary: 120 mg/dL — ABNORMAL HIGH (ref 70–99)
Glucose-Capillary: 116 mg/dL — ABNORMAL HIGH (ref 70–99)
Glucose-Capillary: 132 mg/dL — ABNORMAL HIGH (ref 70–99)
Glucose-Capillary: 203 mg/dL — ABNORMAL HIGH (ref 70–99)

## 2023-08-13 LAB — CBC
HCT: 30.3 % — ABNORMAL LOW (ref 39.0–52.0)
Hemoglobin: 10 g/dL — ABNORMAL LOW (ref 13.0–17.0)
MCH: 30.7 pg (ref 26.0–34.0)
MCHC: 33 g/dL (ref 30.0–36.0)
MCV: 92.9 fL (ref 80.0–100.0)
Platelets: 250 10*3/uL (ref 150–400)
RBC: 3.26 MIL/uL — ABNORMAL LOW (ref 4.22–5.81)
RDW: 15.4 % (ref 11.5–15.5)
WBC: 4.3 10*3/uL (ref 4.0–10.5)
nRBC: 0.5 % — ABNORMAL HIGH (ref 0.0–0.2)

## 2023-08-13 LAB — BASIC METABOLIC PANEL
Anion gap: 8 (ref 5–15)
BUN: 10 mg/dL (ref 6–20)
CO2: 29 mmol/L (ref 22–32)
Calcium: 7.8 mg/dL — ABNORMAL LOW (ref 8.9–10.3)
Chloride: 93 mmol/L — ABNORMAL LOW (ref 98–111)
Creatinine, Ser: 0.52 mg/dL — ABNORMAL LOW (ref 0.61–1.24)
GFR, Estimated: >60 mL/min (ref >60)
Glucose, Bld: 146 mg/dL — ABNORMAL HIGH (ref 70–99)
Potassium: 2.8 mmol/L — ABNORMAL LOW (ref 3.5–5.1)
Sodium: 130 mmol/L — ABNORMAL LOW (ref 135–145)

## 2023-08-13 MED ORDER — DILTIAZEM HCL 30 MG PO TABS
30.0000 mg | ORAL_TABLET | Freq: Four times a day (QID) | ORAL | Status: DC
  Administered 2023-08-13 – 2023-08-15 (×8): 30 mg via ORAL
  Filled 2023-08-13 (×8): qty 1

## 2023-08-13 MED ORDER — POTASSIUM CHLORIDE 10 MEQ/100ML IV SOLN
10.0000 meq | INTRAVENOUS | Status: AC
  Administered 2023-08-13 (×4): 10 meq via INTRAVENOUS
  Filled 2023-08-13 (×3): qty 100

## 2023-08-13 MED ORDER — LIVING WELL WITH DIABETES BOOK: Freq: Once | Status: AC

## 2023-08-13 MED ORDER — PHENOBARBITAL SODIUM 130 MG/ML IJ SOLN
32.5000 mg | Freq: Three times a day (TID) | INTRAMUSCULAR | Status: DC
  Administered 2023-08-13 – 2023-08-15 (×5): 32.5 mg via INTRAVENOUS
  Filled 2023-08-13 (×6): qty 1

## 2023-08-13 MED ORDER — LINAGLIPTIN 5 MG PO TABS
5.0000 mg | ORAL_TABLET | Freq: Every day | ORAL | Status: DC
  Administered 2023-08-13 – 2023-08-19 (×7): 5 mg via ORAL
  Filled 2023-08-13 (×7): qty 1

## 2023-08-13 MED ORDER — POTASSIUM CHLORIDE CRYS ER 20 MEQ PO TBCR
40.0000 meq | EXTENDED_RELEASE_TABLET | Freq: Once | ORAL | Status: AC
  Administered 2023-08-13: 40 meq via ORAL
  Filled 2023-08-13: qty 2

## 2023-08-13 MED ORDER — INSULIN STARTER KIT- PEN NEEDLES (ENGLISH)
1.0000 | Freq: Once | Status: DC
  Filled 2023-08-13: qty 1

## 2023-08-13 NOTE — Progress Notes (Signed)
 Pt wanted to go for a walk, pt ambulated 150 feet, tolerated well.

## 2023-08-13 NOTE — Progress Notes (Signed)
   08/13/23 1228  Spiritual Encounters  Type of Visit Initial  Care provided to: Patient  Referral source IDT Rounds  Reason for visit Routine spiritual support  OnCall Visit No  Spiritual Framework  Presenting Themes Meaning/purpose/sources of inspiration;Significant life change;Courage hope and growth  Community/Connection Family;Friend(s)  Patient Stress Factors Loss;Loss of control;Major life changes  Family Stress Factors None identified  Interventions  Spiritual Care Interventions Made Compassionate presence;Normalization of emotions;Encouragement  Intervention Outcomes  Outcomes Connection to spiritual care;Awareness of support;Awareness around self/spiritual resourses;Autonomy/agency;Awareness of health  Spiritual Care Plan  Spiritual Care Issues Still Outstanding Chaplain will continue to follow   Made routine visit today in order to initiate relationship of support and care, to provide opportunity for reflection, and to provide encouragement. Found patient sitting upright in bed and he greeted me with a smile and welcomed me bedside. He was very open today and amenable. He shared how the last few months have been particularly difficult due to his inability to care for himself and the amount of pain he has been in is excruciating. He welcomed my visit today and asked for a followup visit later in the week. Chaplain will continue to follow in order to provide normalization of fear and support major life changes and loss related to those changes.   Rev. Ether Blush, M.Div [phone]

## 2023-08-13 NOTE — Progress Notes (Signed)

## 2023-08-13 NOTE — Plan of Care (Signed)

## 2023-08-13 NOTE — TOC Progression Note (Addendum)
 Transition of Care [facility]) - Progression Note    Patient Details  Name: HANSON MEDEIROS MRN: [id number] Date of Birth: 10/26/1975  [facility]) CM/SW Contact  Sharlyne Stabs, RN Phone Number: 08/13/2023, 12:04 PM  Clinical Narrative:   Patient lives home alone, need rehab, asking PT to re-evaluate. TOC called [employer] again, he has $1200 more to meet his deductible then they will pay 80% of inpatient rehab. Team updated.   ADDENDUM:   PT is recommending CIR, we need second therapy recommendation. OT will assess in the AM. MD placed orders. PT updated, they will message OT.   Expected Discharge Plan: Home/Self Care Barriers to Discharge: Continued Medical Work up  Expected Discharge Plan and Services     Social Determinants of Health (SDOH) Interventions SDOH Screenings   Food Insecurity: No Food Insecurity (08/08/2023)  Housing: Low Risk  (08/08/2023)  Transportation Needs: No Transportation Needs (08/08/2023)  Utilities: Not At Risk (08/08/2023)  Tobacco Use: High Risk (08/09/2023)    Readmission Risk Interventions     No data to display

## 2023-08-13 NOTE — Evaluation (Addendum)
 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 Transferred to ICU on 08/10/2023.  Clinical Impression   Pt tolerated today's Physical Therapy re-evaluation, due to transfer of care to ICU. Current status shows much improvement from initial evaluation. Pt tolerating >12ft of ambulation with minimal LOB, does note increased unsteadiness with busy, clutter environments and turning. Pt given increased assistance with bed mobility and assistance for transfers due to unsteadiness. Pt is great candidate for inpatient rehab who demonstrates to return to PLOF as well as tolerance for 3 hours of therapy. 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 Based upon these deficits/impairments, patient will benefit from continued skilled physical therapy services during remainder of hospital stay and at the next recommended venue of care to address deficits and promote return to optimal function.                If plan is discharge home, recommend the following: A lot of help with bathing/dressing/bathroom;A little help with walking and/or transfers   Can travel by private vehicle   No    Equipment Recommendations None recommended by PT  Recommendations for Other Services       Functional Status Assessment Patient has had a recent decline in  their functional status and demonstrates the ability to make significant improvements in function in a reasonable and predictable amount of time.     Precautions / Restrictions Restrictions Weight Bearing Restrictions Per Provider Order: No      Mobility  Bed Mobility Overal bed mobility: Needs Assistance Bed Mobility: Supine to Sit, Sit to Supine     Supine to sit: Mod assist Sit to supine: Supervision   General bed mobility comments: moderate assist for supine to sit with flat bed level and trunk elevation. Supervision for return to supine from sitting position. Unsteady with BUE, pt given verbal and tactile cues for proper UB placement. Patient Response: Cooperative, Flat affect  Transfers Overall transfer level: Needs assistance   Transfers: Sit to/from Stand Sit to Stand: Min assist           General transfer comment: sit/stand from EOB min assist to maintain balance performing transfer to RW. Cues for RW management.    Ambulation/Gait Ambulation/Gait assistance: Contact guard assist, Min assist Gait Distance (Feet): 85 Feet Assistive device: Rolling walker (2 wheels) Gait Pattern/deviations: WFL(Within Functional Limits), Step-through pattern, Decreased step length - right, Decreased step length - left, Decreased stance time - right, Decreased stance time - left, Shuffle       General Gait Details: 1ft of ambulation with RW. Unsteady navigating closed environment out to hallway given min assist and cues for RW management.  Once in straight away area, pt ambulating with CGA, unsteady with turning.  Stairs            Wheelchair Mobility     Tilt Bed Tilt Bed Patient Response: Cooperative, Flat affect  Modified Rankin (Stroke Patients Only)       Balance Overall balance assessment: Needs assistance Sitting-balance support: Feet  supported, No upper extremity supported Sitting balance-Leahy Scale: Good Sitting balance - Comments: Sitting EOB    Standing balance support: No upper extremity supported, Bilateral upper extremity supported, Reliant on assistive device for balance Standing balance-Leahy Scale: Fair Standing balance comment: fair with RW and fair without AD.                             Pertinent Vitals/Pain Pain Assessment Pain Assessment: Faces Faces Pain Scale: Hurts whole lot Pain Location: 'everywhere' Pain Intervention(s): Monitored during session, Relaxation    Home Living Family/patient expects to be discharged to:: Private residence Living Arrangements: Alone Available Help at Discharge: Family;Available PRN/intermittently Type of Home: House Home Access: Stairs to enter Entrance Stairs-Rails: Right;Left;Can reach both Entrance Stairs-Number of Steps: 4 Alternate Level Stairs-Number of Steps: 14 Home Layout: Two level Home Equipment: None Additional Comments: Pt reports living in ranch-poor historian    Prior Function Prior Level of Function : Independent/Modified Independent             Mobility Comments: Past few weeks, ambulating with RW and cane at home per pt's mom ADLs Comments: Pt's mother reports that family has been coming over to assist with ADLs the past few days.     Extremity/Trunk Assessment   Upper Extremity Assessment Upper Extremity Assessment: Defer to OT evaluation    Lower Extremity Assessment Lower Extremity Assessment: RLE deficits/detail;LLE deficits/detail RLE Deficits / Details: 4-/5 knee extension MMT RLE Sensation: decreased light touch;decreased proprioception;history of peripheral neuropathy LLE Deficits / Details: 4-/5 knee extension MMT LLE Sensation: decreased light touch;decreased proprioception;history of peripheral neuropathy    Cervical / Trunk Assessment Cervical / Trunk Assessment: Kyphotic  Communication   Communication Communication: No apparent difficulties Cueing Techniques: Verbal cues;Tactile cues  Cognition Arousal:  Alert Behavior During Therapy: WFL for tasks assessed/performed Overall Cognitive Status: Within Functional Limits for tasks assessed                   Orientation Level: Person, Place, Time, Situation Current Attention Level: Focused                    General Comments      Exercises     Assessment/Plan    PT Assessment Patient needs continued PT services  PT Problem List Decreased strength;Decreased activity tolerance;Decreased balance;Decreased mobility       PT Treatment Interventions DME instruction;Gait training;Functional mobility training;Therapeutic activities;Therapeutic exercise;Balance training;Neuromuscular re-education    PT Goals (Current goals can be found in the Care Plan section)  Acute Rehab PT Goals Patient Stated Goal: get stronger and more independent PT Goal Formulation: With patient/family Time For Goal Achievement: 08/23/23 Potential to Achieve Goals: Good    Frequency Min 4X/week     Co-evaluation               AM-PAC PT 6 Clicks Mobility  Outcome Measure Help needed turning from your back to your side while in a flat bed without using bedrails?: A Little Help needed moving from lying on your back to sitting on the side of a flat bed without using bedrails?: A Lot Help needed moving to and from a bed to a chair (including a wheelchair)?: A Little Help needed standing up from a chair using your arms (e.g., wheelchair or bedside chair)?: A Little Help needed to walk in hospital room?: A Lot Help needed climbing 3-5 steps with a railing? : Total 6 Click Score:  14    End of Session Equipment Utilized During Treatment: Gait belt Activity Tolerance: Patient tolerated treatment well;Patient limited by pain;Patient limited by fatigue Patient left: in bed;with call bell/phone within reach;with bed alarm set;with family/visitor present Nurse Communication: Mobility status PT Visit Diagnosis: Unsteadiness on feet (R26.81);Other  abnormalities of gait and mobility (R26.89);Muscle weakness (generalized) (M62.81)    Time: 8570-8554 PT Time Calculation (min) (ACUTE ONLY): 16 min   Charges:   PT Evaluation $PT Re-evaluation: 1 Re-eval   PT General Charges $$ ACUTE PT VISIT: 1 Visit         Omega JONETTA Donna ALMETA, DPT [facility] [place] [address] [phone] office  Omega JONETTA Donna 08/13/2023, 3:05 PM

## 2023-08-13 NOTE — Progress Notes (Signed)
 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   ED evaluation: Blood pressure (!) 159/147, pulse (!) 109, temperature 98.9 F (37.2 C), temperature source Oral, resp. rate 18, SpO2 94%.  LABs; sodium 125, potassium 2.6, chloride 78, serum glucose 559, anion gap 21, WBC 10.2, UA unremarkable with exception of greater than 500 glucose Urine drug screen positive for opioids, marijuana  Neurology was consulted.  They recommended 5-day course of IVIG.  The patient was continued on his home antiepileptic medications and steroids.  Overall, he did show some improvement with his weakness, but continued to have deconditioning.  PT and OT were consulted and recommended CIR.  His hospitalization was also prolonged secondary to development of atrial tachycardia and SVT.       Assessment/Plan: CIDP exacerbation -Patient finished 5 days of IVIG--last dose 08/12/2023 -Resume prednisone  taper 40mg  currently, taper by 10mg  every week to stop  -Outpatient follow-up with Dr. Vear -Appreciate neurology consult  Alcohol  abuse and withdrawal -Patient required Precedex  and phenobarbital  -Finish phenobarbital  taper -Cessation discussed -Continue thiamine  and multivitamin  SVT/atrial tachycardia -Start short acting diltiazem  and monitor -TSH 0.9111 -Echo  Gait instability/deconditioning -PT/OT evaluation -Planning for CIR if accepted  Diabetes mellitus type 2 -No previous history of diabetes -08/08/2023 hemoglobin A1c 8.9 -Continue NovoLog  sliding scale -In  part due to the patient's steroids  Hyponatremia -Secondary to hyperglycemia and chronic steroid use -Overall stable  Peripheral neuropathy - Home medications Cymbalta , as needed oxycodone , Ultram , -Per neurology recommendation continue oxcarbazepine  And Lamictal   Gouty arthritis -Continue Uloric   Hypokalemia -Repleting -Check magnesium   Tobacco abuse -Tobacco cessation discussed -NicoDerm patch  Low folic acid  -Repleting  Peripheral neuropathy/neuropathic pain - Home medications Cymbalta , as needed hydrocodone  -Per neurology recommendation continue oxcarbazepine  And Lamictal   Lactic acidosis -sepsis ruled out -improved with IVF  Obesity -BMI 31.19 -lifestyle modification          Family Communication:   mother updated at bedside 1/8  Consultants:  neurology  Code Status:  FULL  DVT Prophylaxis:   Heparin    Procedures: As Listed in Progress Note Above  Antibiotics: None    Subjective: Patient denies fevers, chills, headache, chest pain, dyspnea, nausea, vomiting, diarrhea, abdominal pain, dysuria   Objective: Vitals:   08/13/23 1238 08/13/23 1300 08/13/23 1400 08/13/23 1500  BP:  (!) 135/91 (!) 133/92 115/81  Pulse: (!) 117 (!) 117 (!) 118 (!) 150  Resp: (!) 21 20 13 19   Temp:      TempSrc:      SpO2: 96% 97% 99% 100%  Weight:      Height:        Intake/Output Summary (Last 24 hours) at 08/13/2023 1546 Last data filed at 08/13/2023 1312 Gross per 24 hour  Intake 1291.75 ml  Output 950 ml  Net 341.75 ml   Weight change: 1.3 kg Exam:  General:  Pt is alert, follows commands appropriately, not in acute distress HEENT: No icterus, No thrush, No neck mass, [place]/AT Cardiovascular: RRR, S1/S2, no rubs, no gallops Respiratory: CTA bilaterally, no wheezing, no crackles, no rhonchi Abdomen: Soft/+BS, non tender, non distended,  no guarding Extremities: trace LE edema, No lymphangitis, No petechiae, No rashes, no synovitis   Data  Reviewed: I have personally reviewed following labs and imaging studies Basic Metabolic Panel: Recent Labs  Lab 08/08/23 1239 08/09/23 0336 08/09/23 2152 08/10/23 0604 08/10/23 0805 08/11/23 0426 08/12/23 0503 08/13/23 0435  NA 127*  125*   < > 129* 130*  --  131* 130* 130*  K 2.6*   < > 3.2* 2.8*  --  3.5 3.1* 2.8*  CL 78*   < > 90* 91*  --  96* 93* 93*  CO2 26   < > 29 29  --  25 27 29   GLUCOSE 559*   < > 188* 166*  --  209* 172* 146*  BUN 10   < > 17 16  --  13 10 10   CREATININE 0.82   < > 0.65 0.51*  --  0.64 0.50* 0.52*  CALCIUM  9.2   < > 8.5* 8.1*  --  7.9* 7.7* 7.8*  MG 1.7  --   --   --  1.7  --   --   --   PHOS 2.7  --   --   --   --   --   --   --    < > = values in this interval not displayed.   Liver Function Tests: Recent Labs  Lab 08/09/23 2152 08/10/23 0604  AST 59* 57*  ALT 56* 50*  ALKPHOS 113 94  BILITOT 1.4* 1.3*  PROT 6.6 5.9*  ALBUMIN  2.6* 2.3*   No results for input(s): LIPASE, AMYLASE in the last 168 hours. Recent Labs  Lab 08/09/23 2152  AMMONIA 24   Coagulation Profile: Recent Labs  Lab 08/08/23 1239  INR 1.0   CBC: Recent Labs  Lab 08/08/23 1239 08/09/23 0336 08/10/23 0604 08/11/23 0426 08/12/23 0503 08/13/23 0435  WBC 10.2 10.3 4.8 4.1 4.0 4.3  NEUTROABS 5.9  --   --   --   --   --   HGB 13.6 10.8* 10.1* 10.2* 10.4* 10.0*  HCT 37.8* 30.4* 28.4* 30.7* 31.8* 30.3*  MCV 88.1 87.9 91.0 93.3 93.5 92.9  PLT 386 300 260 256 235 250   Cardiac Enzymes: No results for input(s): CKTOTAL, CKMB, CKMBINDEX, TROPONINI in the last 168 hours. BNP: Invalid input(s): POCBNP CBG: Recent Labs  Lab 08/12/23 1554 08/12/23 2051 08/13/23 0730 08/13/23 1203 08/13/23 1544  GLUCAP 109* 175* 120* 116* 132*   HbA1C: No results for input(s): HGBA1C in the last 72 hours. Urine analysis:    Component Value Date/Time   COLORURINE YELLOW 08/08/2023 1219   APPEARANCEUR CLEAR 08/08/2023 1219   LABSPEC 1.026 08/08/2023 1219    PHURINE 6.0 08/08/2023 1219   GLUCOSEU >=500 (A) 08/08/2023 1219   HGBUR NEGATIVE 08/08/2023 1219   BILIRUBINUR NEGATIVE 08/08/2023 1219   KETONESUR 5 (A) 08/08/2023 1219   PROTEINUR NEGATIVE 08/08/2023 1219   NITRITE NEGATIVE 08/08/2023 1219   LEUKOCYTESUR NEGATIVE 08/08/2023 1219   Sepsis Labs: @LABRCNTIP (procalcitonin:4,lacticidven:4) ) Recent Results (from the past 240 hours)  MRSA Next Gen by PCR, Nasal     Status: None   Collection Time: 08/09/23  9:00 AM   Specimen: Nasal Mucosa; Nasal Swab  Result Value Ref Range Status   MRSA by PCR Next Gen NOT DETECTED NOT DETECTED Final    Comment: (NOTE) The GeneXpert MRSA Assay (FDA approved for NASAL specimens only), is one component of a comprehensive MRSA colonization surveillance program. It is not intended  to diagnose MRSA infection nor to guide or monitor treatment for MRSA infections. Test performance is not FDA approved in patients less than 33 years old. Performed at [facility], [address] [place], [place] [address]      Scheduled Meds:  Chlorhexidine  Gluconate Cloth  6 each Topical Daily   diltiazem   30 mg Oral Q6H   DULoxetine   60 mg Oral Daily   febuxostat   40 mg Oral Daily   folic acid   1 mg Oral Daily   heparin   5,000 Units Subcutaneous Q8H   insulin  aspart  0-6 Units Subcutaneous TID WC   insulin  starter kit- pen needles  1 kit Other Once   methylPREDNISolone  (SOLU-MEDROL ) injection  40 mg Intravenous Q24H   Followed by   NOREEN ON 08/17/2023] predniSONE   30 mg Oral Q breakfast   Followed by   NOREEN ON 08/24/2023] predniSONE   20 mg Oral Q breakfast   Followed by   NOREEN ON 08/31/2023] predniSONE   10 mg Oral Q breakfast   multivitamin with minerals  1 tablet Oral Daily   nicotine   21 mg Transdermal Daily   nortriptyline   50 mg Oral QHS   Oxcarbazepine   300 mg Oral Daily   OXcarbazepine   600 mg Oral QHS   pantoprazole  (PROTONIX ) IV  40 mg Intravenous Q24H   PHENObarbital   32.5 mg Intravenous Q8H    potassium chloride   40 mEq Oral Once   sodium chloride  flush  3 mL Intravenous Q12H   thiamine  (VITAMIN B1) injection  100 mg Intravenous Daily   Continuous Infusions:  dexmedetomidine  (PRECEDEX ) IV infusion Stopped (08/13/23 0616)    Procedures/Studies: CT HEAD WO CONTRAST ( ) Result Date: 08/10/2023 CLINICAL DATA:  Altered mental status, nontraumatic (Ped 0-17y). EXAM: CT HEAD WITHOUT CONTRAST TECHNIQUE: Contiguous axial images were obtained from the base of the skull through the vertex without intravenous contrast. RADIATION DOSE REDUCTION: This exam was performed according to the departmental dose-optimization program which includes automated exposure control, adjustment of the mA and/or kV according to patient size and/or use of iterative reconstruction technique. COMPARISON:  CT head June 25, 24. FINDINGS: Brain: No evidence of acute infarction, hemorrhage, hydrocephalus, extra-axial collection or mass lesion/mass effect. Vascular: No hyperdense vessel or unexpected calcification. Skull: No acute fracture. Sinuses/Orbits: Clear sinuses.  No acute orbital findings. Other: No mastoid effusions. IMPRESSION: Stable head CT.  No evidence of acute intracranial abnormality. Electronically Signed   By: Gilmore GORMAN Molt M.D.   On: 08/10/2023 02:21    Alm Schneider, DO  [facility]  If 7PM-7AM, please contact night-coverage [email or web address] Password [email or web address] 08/13/2023, 3:46 PM   LOS: 5 days

## 2023-08-13 NOTE — Plan of Care (Signed)
  Problem: Acute Rehab PT Goals(only PT should resolve) Goal: Pt Will Go Supine/Side To Sit Outcome: Progressing Goal: Patient Will Perform Sitting Balance Outcome: Progressing Goal: Patient Will Transfer Sit To/From Stand Outcome: Progressing Goal: Pt Will Perform Standing Balance Or Pre-Gait Outcome: Progressing Goal: Pt Will Ambulate Outcome: Progressing Goal: Pt Will Go Up/Down Stairs Outcome: Progressing  Connor Copeland, DPT [facility] [place] [phone] office

## 2023-08-14 ENCOUNTER — Other Ambulatory Visit (HOSPITAL_COMMUNITY): Payer: Self-pay | Admitting: *Deleted

## 2023-08-14 ENCOUNTER — Inpatient Hospital Stay (HOSPITAL_COMMUNITY): Payer: 59

## 2023-08-14 DIAGNOSIS — I471 Supraventricular tachycardia, unspecified: Secondary | ICD-10-CM

## 2023-08-14 DIAGNOSIS — R269 Unspecified abnormalities of gait and mobility: Secondary | ICD-10-CM | POA: Diagnosis not present

## 2023-08-14 DIAGNOSIS — G6181 Chronic inflammatory demyelinating polyneuritis: Secondary | ICD-10-CM | POA: Diagnosis not present

## 2023-08-14 DIAGNOSIS — F10932 Alcohol use, unspecified with withdrawal with perceptual disturbance: Secondary | ICD-10-CM | POA: Diagnosis not present

## 2023-08-14 LAB — BASIC METABOLIC PANEL
Anion gap: 7 (ref 5–15)
BUN: 9 mg/dL (ref 6–20)
CO2: 27 mmol/L (ref 22–32)
Calcium: 8 mg/dL — ABNORMAL LOW (ref 8.9–10.3)
Chloride: 96 mmol/L — ABNORMAL LOW (ref 98–111)
Creatinine, Ser: 0.58 mg/dL — ABNORMAL LOW (ref 0.61–1.24)
GFR, Estimated: >60 mL/min (ref >60)
Glucose, Bld: 131 mg/dL — ABNORMAL HIGH (ref 70–99)
Potassium: 3.3 mmol/L — ABNORMAL LOW (ref 3.5–5.1)
Sodium: 130 mmol/L — ABNORMAL LOW (ref 135–145)

## 2023-08-14 LAB — ECHOCARDIOGRAM COMPLETE
AR max vel: 3.05 cm2
AV Area VTI: 3.08 cm2
AV Area mean vel: 3.03 cm2
AV Mean grad: 2 mm[Hg]
AV Peak grad: 4.4 mm[Hg]
Ao pk vel: 1.05 m/s
Area-P 1/2: 4.6 cm2
Height: 74 in
S' Lateral: 3 cm
Weight: 3887.15 [oz_av]

## 2023-08-14 LAB — GLUCOSE, CAPILLARY
Glucose-Capillary: 118 mg/dL — ABNORMAL HIGH (ref 70–99)
Glucose-Capillary: 125 mg/dL — ABNORMAL HIGH (ref 70–99)
Glucose-Capillary: 119 mg/dL — ABNORMAL HIGH (ref 70–99)
Glucose-Capillary: 216 mg/dL — ABNORMAL HIGH (ref 70–99)

## 2023-08-14 LAB — MAGNESIUM: Magnesium: 1.2 mg/dL — ABNORMAL LOW (ref 1.7–2.4)

## 2023-08-14 MED ORDER — MAGNESIUM SULFATE 2 GM/50ML IV SOLN
2.0000 g | Freq: Once | INTRAVENOUS | Status: AC
Start: 2023-08-14 — End: 2023-08-15
  Administered 2023-08-14: 2 g via INTRAVENOUS
  Filled 2023-08-14: qty 50

## 2023-08-14 MED ORDER — POTASSIUM CHLORIDE CRYS ER 20 MEQ PO TBCR
20.0000 meq | EXTENDED_RELEASE_TABLET | Freq: Once | ORAL | Status: AC
  Administered 2023-08-14: 20 meq via ORAL
  Filled 2023-08-14: qty 1

## 2023-08-14 MED ORDER — POTASSIUM CHLORIDE CRYS ER 20 MEQ PO TBCR
40.0000 meq | EXTENDED_RELEASE_TABLET | Freq: Once | ORAL | Status: AC
  Administered 2023-08-14: 40 meq via ORAL
  Filled 2023-08-14: qty 2

## 2023-08-14 MED ORDER — MAGNESIUM SULFATE 2 GM/50ML IV SOLN
2.0000 g | Freq: Once | INTRAVENOUS | Status: AC
  Administered 2023-08-14: 2 g via INTRAVENOUS
  Filled 2023-08-14: qty 50

## 2023-08-14 MED ORDER — MAGNESIUM SULFATE 2 GM/50ML IV SOLN: 2.0000 g | Freq: Once | INTRAVENOUS | Status: DC

## 2023-08-14 NOTE — Progress Notes (Signed)
 Pt wanted to go on another walk, pt ambulated 300 feet with walker, tolerated well.

## 2023-08-14 NOTE — Plan of Care (Signed)
  Problem: Education: Goal: Knowledge of General Education information will improve Description: Including pain rating scale, medication(s)/side effects and non-pharmacologic comfort measures Outcome: Progressing   Problem: Health Behavior/Discharge Planning: Goal: Ability to manage health-related needs will improve Outcome: Progressing   Problem: Clinical Measurements: Goal: Ability to maintain clinical measurements within normal limits will improve Outcome: Progressing Goal: Will remain free from infection Outcome: Progressing Goal: Diagnostic test results will improve Outcome: Progressing Goal: Respiratory complications will improve Outcome: Progressing Goal: Cardiovascular complication will be avoided Outcome: Progressing   Problem: Activity: Goal: Risk for activity intolerance will decrease Outcome: Progressing   Problem: Nutrition: Goal: Adequate nutrition will be maintained Outcome: Progressing   Problem: Coping: Goal: Level of anxiety will decrease Outcome: Progressing   Problem: Elimination: Goal: Will not experience complications related to bowel motility Outcome: Progressing Goal: Will not experience complications related to urinary retention Outcome: Progressing   Problem: Pain Management: Goal: General experience of comfort will improve Outcome: Progressing   Problem: Safety: Goal: Ability to remain free from injury will improve Outcome: Progressing   Problem: Skin Integrity: Goal: Risk for impaired skin integrity will decrease Outcome: Progressing   Problem: Education: Goal: Ability to describe self-care measures that may prevent or decrease complications (Diabetes Survival Skills Education) will improve Outcome: Progressing Goal: Individualized Educational Video(s) Outcome: Progressing   Problem: Coping: Goal: Ability to adjust to condition or change in health will improve Outcome: Progressing   Problem: Fluid Volume: Goal: Ability to  maintain a balanced intake and output will improve Outcome: Progressing   Problem: Health Behavior/Discharge Planning: Goal: Ability to identify and utilize available resources and services will improve Outcome: Progressing Goal: Ability to manage health-related needs will improve Outcome: Progressing   Problem: Metabolic: Goal: Ability to maintain appropriate glucose levels will improve Outcome: Progressing   Problem: Nutritional: Goal: Maintenance of adequate nutrition will improve Outcome: Progressing Goal: Progress toward achieving an optimal weight will improve Outcome: Progressing   Problem: Skin Integrity: Goal: Risk for impaired skin integrity will decrease Outcome: Progressing   Problem: Tissue Perfusion: Goal: Adequacy of tissue perfusion will improve Outcome: Progressing   Problem: Safety: Goal: Non-violent Restraint(s) Outcome: Progressing

## 2023-08-14 NOTE — Progress Notes (Addendum)
 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   ED evaluation: Blood pressure (!) 159/147, pulse (!) 109, temperature 98.9 F (37.2 C), temperature source Oral, resp. rate 18, SpO2 94%.  LABs; sodium 125, potassium 2.6, chloride 78, serum glucose 559, anion gap 21, WBC 10.2, UA unremarkable with exception of greater than 500 glucose Urine drug screen positive for opioids, marijuana  Neurology was consulted.  They recommended 5-day course of IVIG.  The patient was continued on his home antiepileptic medications and steroids.  Overall, he did show some improvement with his weakness, but continued to have deconditioning.  PT and OT were consulted and recommended CIR.  His hospitalization was also prolonged secondary to development of atrial tachycardia and SVT.       Assessment/Plan:  CIDP exacerbation -Patient finished 5 days of IVIG--last dose 08/12/2023 -Resume prednisone  taper 40mg  currently, taper by 10mg  every week to stop  -Outpatient follow-up with Dr. Vear -Appreciate neurology consult   Alcohol  abuse and withdrawal -Patient required Precedex  and phenobarbital  -Finish phenobarbital  taper -Cessation discussed -Continue thiamine  and multivitamin -now improved   SVT/atrial tachycardia -Start short acting diltiazem  and monitor -TSH 0.9111 -1/9 Echo EF 60-65%, no WMA, G1DD, normal RVF -appreciate cardiology   Gait instability/deconditioning -PT/OT evaluation -Planning for CIR if insurance approves   Diabetes mellitus type 2 -No  previous history of diabetes -08/08/2023 hemoglobin A1c 8.9 -Continue NovoLog  sliding scale -In part due to the patient's steroids -add tradjenta    Hyponatremia -Secondary to hyperglycemia and chronic steroid use -Overall stable   Peripheral neuropathy - Home medications Cymbalta , as needed oxycodone , Ultram , -Per neurology recommendation continue oxcarbazepine  And Lamictal    Gouty arthritis -Continue Uloric    Hypokalemia/Hypomagnesemia -Repleting -Check magnesium  1.2 -give mag 4 grams x 1   Tobacco abuse -Tobacco cessation discussed -NicoDerm patch   Low folic acid  -Repleting   Peripheral neuropathy/neuropathic pain - Home medications Cymbalta , as needed hydrocodone  -Per neurology recommendation continue oxcarbazepine  And Lamictal    Lactic acidosis -sepsis ruled out -improved with IVF   Obesity -BMI 31.19 -lifestyle modification                   Family Communication:   mother updated at bedside 1/8   Consultants:  neurology   Code Status:  FULL   DVT Prophylaxis:  [place] Heparin      Procedures: As Listed in Progress Note Above   Antibiotics: None        Subjective: Patient denies fevers, chills, headache, chest pain, dyspnea, nausea, vomiting, diarrhea, abdominal pain, dysuria, hematuria, hematochezia, and melena.   Objective: Vitals:   08/14/23 1600 08/14/23 1606 08/14/23 1607 08/14/23 1608  BP: 110/84     Pulse: (!) 118     Resp: 17     Temp: 98.3 F (36.8 C)     TempSrc: Oral     SpO2:  100% 96% 98%  Weight:      Height:        Intake/Output Summary (Last 24 hours) at 08/14/2023 1714 Last data filed at 08/14/2023 0903 Gross per 24 hour  Intake 9 ml  Output --  Net 9 ml   Weight change:  Exam:  General:  Pt is alert, follows commands appropriately, not in acute distress HEENT: No icterus, No thrush, No neck mass, [place]/AT Cardiovascular: RRR, S1/S2, no rubs, no gallops Respiratory: CTA bilaterally, no wheezing, no crackles,  no rhonchi Abdomen: Soft/+BS, non tender, non distended, no guarding Extremities: No edema, No lymphangitis, No petechiae, No rashes, no synovitis   Data Reviewed: I have personally reviewed following labs and imaging studies Basic Metabolic Panel: Recent Labs  Lab 08/08/23 1239 08/09/23 0336 08/10/23 0604 08/10/23 0805 08/11/23 0426 08/12/23 0503 08/13/23 0435 08/14/23 0451  NA 127*  125*   < > 130*  --  131* 130* 130* 130*  K 2.6*   < > 2.8*  --  3.5 3.1* 2.8* 3.3*  CL 78*   < > 91*  --  96* 93* 93* 96*  CO2 26   < > 29  --  25 27 29 27   GLUCOSE 559*   < > 166*  --  209* 172* 146* 131*  BUN 10   < > 16  --  13 10 10 9   CREATININE 0.82   < > 0.51*  --  0.64 0.50* 0.52* 0.58*  CALCIUM  9.2   < > 8.1*  --  7.9* 7.7* 7.8* 8.0*  MG 1.7  --   --  1.7  --   --   --  1.2*  PHOS 2.7  --   --   --   --   --   --   --    < > = values in this interval not displayed.   Liver Function Tests: Recent Labs  Lab 08/09/23 2152 08/10/23 0604  AST 59* 57*  ALT 56* 50*  ALKPHOS 113 94  BILITOT 1.4* 1.3*  PROT 6.6 5.9*  ALBUMIN  2.6* 2.3*   No results for input(s): LIPASE, AMYLASE in the last 168 hours. Recent Labs  Lab 08/09/23 2152  AMMONIA 24   Coagulation Profile: Recent Labs  Lab 08/08/23 1239  INR 1.0   CBC: Recent Labs  Lab 08/08/23 1239 08/09/23 0336 08/10/23 0604 08/11/23 0426 08/12/23 0503 08/13/23 0435  WBC 10.2 10.3 4.8 4.1 4.0 4.3  NEUTROABS 5.9  --   --   --   --   --   HGB 13.6 10.8* 10.1* 10.2* 10.4* 10.0*  HCT 37.8* 30.4* 28.4* 30.7* 31.8* 30.3*  MCV 88.1 87.9 91.0 93.3 93.5 92.9  PLT 386 300 260 256 235 250   Cardiac Enzymes: No results for input(s): CKTOTAL, CKMB, CKMBINDEX, TROPONINI in the last 168 hours. BNP: Invalid input(s): POCBNP CBG: Recent Labs  Lab 08/13/23 1544 08/13/23 2119 08/14/23 0746 08/14/23 1159 08/14/23 1612  GLUCAP 132* 203* 118* 125* 119*   HbA1C: No results for input(s): HGBA1C in the last 72  hours. Urine analysis:    Component Value Date/Time   COLORURINE YELLOW 08/08/2023 1219   APPEARANCEUR CLEAR 08/08/2023 1219   LABSPEC 1.026 08/08/2023 1219   PHURINE 6.0 08/08/2023 1219   GLUCOSEU >=500 (A) 08/08/2023 1219   HGBUR NEGATIVE 08/08/2023 1219   BILIRUBINUR NEGATIVE 08/08/2023 1219   KETONESUR 5 (A) 08/08/2023 1219   PROTEINUR NEGATIVE 08/08/2023 1219   NITRITE NEGATIVE 08/08/2023 1219   LEUKOCYTESUR NEGATIVE 08/08/2023 1219   Sepsis Labs: @LABRCNTIP (procalcitonin:4,lacticidven:4) ) Recent Results (from the past 240 hours)  MRSA Next Gen by PCR, Nasal     Status: None   Collection Time: 08/09/23  9:00 AM   Specimen: Nasal  Mucosa; Nasal Swab  Result Value Ref Range Status   MRSA by PCR Next Gen NOT DETECTED NOT DETECTED Final    Comment: (NOTE) The GeneXpert MRSA Assay (FDA approved for NASAL specimens only), is one component of a comprehensive MRSA colonization surveillance program. It is not intended to diagnose MRSA infection nor to guide or monitor treatment for MRSA infections. Test performance is not FDA approved in patients less than 71 years old. Performed at [facility], [address] [place], [place] [address]      Scheduled Meds:  Chlorhexidine  Gluconate Cloth  6 each Topical Daily   diltiazem   30 mg Oral Q6H   DULoxetine   60 mg Oral Daily   febuxostat   40 mg Oral Daily   folic acid   1 mg Oral Daily   heparin   5,000 Units Subcutaneous Q8H   insulin  aspart  0-6 Units Subcutaneous TID WC   insulin  starter kit- pen needles  1 kit Other Once   linagliptin   5 mg Oral Daily   methylPREDNISolone  (SOLU-MEDROL ) injection  40 mg Intravenous Q24H   Followed by   NOREEN ON 08/17/2023] predniSONE   30 mg Oral Q breakfast   Followed by   NOREEN ON 08/24/2023] predniSONE   20 mg Oral Q breakfast   Followed by   NOREEN ON 08/31/2023] predniSONE   10 mg Oral Q breakfast   multivitamin with minerals  1 tablet Oral Daily   nicotine   21 mg Transdermal Daily    nortriptyline   50 mg Oral QHS   Oxcarbazepine   300 mg Oral Daily   OXcarbazepine   600 mg Oral QHS   pantoprazole  (PROTONIX ) IV  40 mg Intravenous Q24H   PHENObarbital   32.5 mg Intravenous Q8H   potassium chloride   20 mEq Oral Once   sodium chloride  flush  3 mL Intravenous Q12H   thiamine  (VITAMIN B1) injection  100 mg Intravenous Daily   Continuous Infusions:  dexmedetomidine  (PRECEDEX ) IV infusion Stopped (08/13/23 0616)    Procedures/Studies: ECHOCARDIOGRAM COMPLETE Result Date: 08/14/2023    ECHOCARDIOGRAM REPORT   Patient Name:   YAXIEL MINNIE Date of Exam: 08/14/2023 Medical Rec #:  [id number]              Height:       74.0 in Accession #:    [id number]             Weight:       242.9 lb Date of Birth:  February 04, 1976              BSA:          2.361 m Patient Age:    47 years               BP:           117/71 mmHg Patient Gender: M                      HR:           100 bpm. Exam Location:  [facility] Procedure: 2D Echo, Cardiac Doppler and Color Doppler Indications:    SVT (supraventricular tachycardia) (HCC)  History:        Patient has prior history of Echocardiogram examinations, most                 recent 01/22/2023. ETOH abuse; Risk Factors:Current Smoker and                 Dyslipidemia.  Sonographer:  Tillman Nora RVT RCS Referring Phys: [phone] Chinedum Vanhouten  Sonographer Comments: Technically difficult study due to poor echo windows, Technically challenging study due to limited acoustic windows, suboptimal parasternal window, suboptimal apical window, suboptimal subcostal window and patient is obese. Image acquisition challenging due to uncooperative patient. IMPRESSIONS  1. Left ventricular ejection fraction, by estimation, is 60 to 65%. The left ventricle has normal function. The left ventricle has no regional wall motion abnormalities. There is mild left ventricular hypertrophy. Left ventricular diastolic parameters are consistent with Grade I diastolic dysfunction (impaired  relaxation).  2. Right ventricular systolic function is normal. The right ventricular size is normal.  3. The mitral valve is normal in structure. No evidence of mitral valve regurgitation. No evidence of mitral stenosis.  4. The aortic valve is tricuspid. Aortic valve regurgitation is not visualized. No aortic stenosis is present. FINDINGS  Left Ventricle: Left ventricular ejection fraction, by estimation, is 60 to 65%. The left ventricle has normal function. The left ventricle has no regional wall motion abnormalities. The left ventricular internal cavity size was normal in size. There is  mild left ventricular hypertrophy. Left ventricular diastolic parameters are consistent with Grade I diastolic dysfunction (impaired relaxation). Normal left ventricular filling pressure. Right Ventricle: The right ventricular size is normal. Right vetricular wall thickness was not well visualized. Right ventricular systolic function is normal. Left Atrium: Left atrial size was normal in size. Right Atrium: Right atrial size was normal in size. Pericardium: There is no evidence of pericardial effusion. Mitral Valve: The mitral valve is normal in structure. No evidence of mitral valve regurgitation. No evidence of mitral valve stenosis. Tricuspid Valve: The tricuspid valve is normal in structure. Tricuspid valve regurgitation is not demonstrated. No evidence of tricuspid stenosis. Aortic Valve: The aortic valve is tricuspid. Aortic valve regurgitation is not visualized. No aortic stenosis is present. Aortic valve mean gradient measures 2.0 mmHg. Aortic valve peak gradient measures 4.4 mmHg. Aortic valve area, by VTI measures 3.08 cm. Pulmonic Valve: The pulmonic valve was not well visualized. Pulmonic valve regurgitation is not visualized. No evidence of pulmonic stenosis. Aorta: The aortic root is normal in size and structure. IAS/Shunts: The interatrial septum was not well visualized.  LEFT VENTRICLE PLAX 2D LVIDd:          4.30 cm   Diastology LVIDs:         3.00 cm   LV e' medial:    6.53 cm/s LV PW:         1.20 cm   LV E/e' medial:  8.7 LV IVS:        1.30 cm   LV e' lateral:   7.18 cm/s LVOT diam:     2.10 cm   LV E/e' lateral: 7.9 LV SV:         48 LV SV Index:   20 LVOT Area:     3.46 cm  RIGHT VENTRICLE RV S prime:     14.50 cm/s LEFT ATRIUM             Index LA diam:        3.20 cm 1.36 cm/m LA Vol (A2C):   41.7 ml 17.66 ml/m LA Vol (A4C):   46.3 ml 19.61 ml/m LA Biplane Vol: 44.7 ml 18.93 ml/m  AORTIC VALVE                    PULMONIC VALVE AV Area (Vmax):    3.05 cm     PV  Vmax:       0.92 m/s AV Area (Vmean):   3.03 cm     PV Peak grad:  3.4 mmHg AV Area (VTI):     3.08 cm AV Vmax:           105.00 cm/s AV Vmean:          70.600 cm/s AV VTI:            0.155 m AV Peak Grad:      4.4 mmHg AV Mean Grad:      2.0 mmHg LVOT Vmax:         92.40 cm/s LVOT Vmean:        61.800 cm/s LVOT VTI:          0.138 m LVOT/AV VTI ratio: 0.89  AORTA Ao Root diam: 3.80 cm Ao Asc diam:  3.50 cm MITRAL VALVE MV Area (PHT): 4.60 cm    SHUNTS MV Decel Time: 165 msec    Systemic VTI:  0.14 m MV E velocity: 56.90 cm/s  Systemic Diam: 2.10 cm MV A velocity: 88.10 cm/s MV E/A ratio:  0.65 Dorn Ross MD Electronically signed by Dorn Ross MD Signature Date/Time: 08/14/2023/12:31:06 PM    Final    CT HEAD WO CONTRAST ( ) Result Date: 08/10/2023 CLINICAL DATA:  Altered mental status, nontraumatic (Ped 0-17y). EXAM: CT HEAD WITHOUT CONTRAST TECHNIQUE: Contiguous axial images were obtained from the base of the skull through the vertex without intravenous contrast. RADIATION DOSE REDUCTION: This exam was performed according to the departmental dose-optimization program which includes automated exposure control, adjustment of the mA and/or kV according to patient size and/or use of iterative reconstruction technique. COMPARISON:  CT head June 25, 24. FINDINGS: Brain: No evidence of acute infarction, hemorrhage, hydrocephalus,  extra-axial collection or mass lesion/mass effect. Vascular: No hyperdense vessel or unexpected calcification. Skull: No acute fracture. Sinuses/Orbits: Clear sinuses.  No acute orbital findings. Other: No mastoid effusions. IMPRESSION: Stable head CT.  No evidence of acute intracranial abnormality. Electronically Signed   By: Gilmore GORMAN Molt M.D.   On: 08/10/2023 02:21    Alm Schneider, DO  [facility]  If 7PM-7AM, please contact night-coverage [email or web address] Password [email or web address] 08/14/2023, 5:14 PM   LOS: 6 days

## 2023-08-14 NOTE — Plan of Care (Signed)
  Problem: Acute Rehab OT Goals (only OT should resolve) Goal: Pt. Will Perform Grooming Flowsheets (Taken 08/14/2023 0944) Pt Will Perform Grooming:  with modified independence  standing Goal: Pt. Will Transfer To Toilet Flowsheets (Taken 08/14/2023 0944) Pt Will Transfer to Toilet:  with modified independence  ambulating Goal: Pt/Caregiver Will Perform Home Exercise Program Flowsheets (Taken 08/14/2023 0944) Pt/caregiver will Perform Home Exercise Program:  Increased strength  Independently  Both right and left upper extremity  Dorethia Jeanmarie OT, MOT

## 2023-08-14 NOTE — PMR Pre-admission (Signed)
 PMR Admission Coordinator Pre-Admission Assessment  Patient: Connor Copeland is an 48 y.o., male MRN: [id number] DOB: October 03, 1975 Height: 6' 2 (188 cm) Weight: 105.1 kg  Insurance Information HMO:     PPO:      PCP:      IPA:      80/20: yes     OTHER: Group [id number] PRIMARY: [employer] options      Policy#: [id number]      Subscriber: patient CM Name: Philippe       Phone#: [phone] ext: [phone]   Fax#: [phone]  Pre-Cert#: [id number] Received approval from Deidre T. Pt approved 08/18/23-08/24/23. Updates due 6 days post admission.      Employer: Not working Benefits:  Phone #: [phone]     Name: [facility] provider portal Eff. Date: 08/06/23     Deduct: $1650 (met $1305.42)      Out of Pocket Max: $5000 [phone])      Life Max: n/a CIR: 80%      SNF: 80% Outpatient: 80%     Co-Pay: 20% Home Health: 80%      Co-Pay: 20% DME: 80%     Co-Pay: 20% Providers: in network  SECONDARY:       Policy#:      Phone#:   Artist:       Phone#:   The Data Processing Manager" for patients in Inpatient Rehabilitation Facilities with attached "Privacy Act Statement-Health Care Records" was provided and verbally reviewed with: Patient  Emergency Contact Information Contact Information     Name Relation Home Work Mobile   Gongora,BRONWYN Mother [phone]        Other Contacts     Name Relation Home Work Mobile   [place] Sister   [phone]       Current Medical History  Patient Admitting Diagnosis: CIDP  History of Present Illness: a 48 y.o. man with a history of hyperlipidemia, alcohol  abuse, CIDP, seizure disorder, neuropathic pain who was admitted on 08/07/22 for progressive weakness and increasing falls. He was started on a course of IVIG 400 mg/kg x 5 days for CIDP on 1/3. Per chart review, patient has had worsening of alcohol  withdrawal and has been transferred to ICU for Precedex . Patient is unable to provide a history. He is agitated,  in 2 point restraints, and mumbles unintelligible sounds. On exam, he has no lateralizing weakness. Face is symmetric. He has spontaneous symmetric antigravity movements of all 4 extremities. He resists eye opening. Labs are significant for Na 128, urine tox screen positive for opiates and THC. He drinks 42 alcoholic drinks per week. Patient does seem encephalopathic on exam. IVIG can rarely lead to PRES, but it is difficult to say whether this is what is going on because patient has multiple other issues that could be causing him to be encephalopathic (e.g. alcohol  withdrawal, hyponatremia). PT/OT evaluations completed with recommendations for acute inpatient rehab admission.   Patient's medical record from [facility] has been reviewed by the rehabilitation admission coordinator and physician.  Past Medical History  Past Medical History:  Diagnosis Date   ETOH abuse    Gout     Has the patient had major surgery during 100 days prior to admission? No  Family History   family history includes COPD in his father.  Current Medications  Current Facility-Administered Medications:    acetaminophen  (TYLENOL ) tablet 650 mg, 650 mg, Oral, Q6H PRN **OR** acetaminophen  (TYLENOL ) suppository 650 mg, 650 mg, Rectal, Q6H PRN, Tat, Alm,  MD   bisacodyl  (DULCOLAX) EC tablet 5 mg, 5 mg, Oral, Daily PRN, Tat, Alm, MD   diltiazem  (CARDIZEM  CD) 24 hr capsule 120 mg, 120 mg, Oral, Daily, Tat, David, MD, 120 mg at 08/19/23 9085   DULoxetine  (CYMBALTA ) DR capsule 60 mg, 60 mg, Oral, Daily, Tat, David, MD, 60 mg at 08/19/23 9083   febuxostat  (ULORIC ) tablet 40 mg, 40 mg, Oral, Daily, Tat, David, MD, 40 mg at 08/19/23 9085   folic acid  (FOLVITE ) tablet 1 mg, 1 mg, Oral, Daily, Tat, David, MD, 1 mg at 08/19/23 9085   heparin  injection 5,000 Units, 5,000 Units, Subcutaneous, Q8H, Tat, Alm, MD, 5,000 Units at 08/19/23 0510   hydrALAZINE  (APRESOLINE ) injection 10 mg, 10 mg, Intravenous, Q4H PRN, Tat,  Alm, MD, 10 mg at 08/12/23 0343   HYDROcodone -acetaminophen  (NORCO) 7.5-325 MG per tablet 1 tablet, 1 tablet, Oral, TID PRN, Evonnie Alm, MD, 1 tablet at 08/19/23 9074   insulin  aspart (novoLOG ) injection 0-6 Units, 0-6 Units, Subcutaneous, TID WC, Tat, Alm, MD, 2 Units at 08/18/23 1754   insulin  starter kit- pen needles (English) 1 kit, 1 kit, Other, Once, Tat, Alm, MD   ipratropium (ATROVENT ) nebulizer solution 0.5 mg, 0.5 mg, Nebulization, Q6H PRN, Tat, David, MD   levalbuterol  (XOPENEX ) nebulizer solution 0.63 mg, 0.63 mg, Nebulization, Q6H PRN, Tat, Alm, MD   linagliptin  (TRADJENTA ) tablet 5 mg, 5 mg, Oral, Daily, Tat, David, MD, 5 mg at 08/19/23 9085   magnesium  oxide (MAG-OX) tablet 400 mg, 400 mg, Oral, BID, Tat, Alm, MD, 400 mg at 08/19/23 9085   multivitamin with minerals tablet 1 tablet, 1 tablet, Oral, Daily, Tat, David, MD, 1 tablet at 08/19/23 9085   nicotine  (NICODERM CQ  - dosed in mg/24 hours) patch 21 mg, 21 mg, Transdermal, Daily, Tat, David, MD, 21 mg at 08/19/23 9083   nortriptyline  (PAMELOR ) capsule 50 mg, 50 mg, Oral, QHS, Tat, Alm, MD, 50 mg at 08/18/23 2133   ondansetron  (ZOFRAN ) tablet 4 mg, 4 mg, Oral, Q6H PRN **OR** ondansetron  (ZOFRAN ) injection 4 mg, 4 mg, Intravenous, Q6H PRN, Tat, David, MD   Oral care mouth rinse, 15 mL, Mouth Rinse, PRN, Tat, Alm, MD   Oxcarbazepine  (TRILEPTAL ) tablet 300 mg, 300 mg, Oral, Daily, Tat, David, MD, 300 mg at 08/19/23 9085   Oxcarbazepine  (TRILEPTAL ) tablet 600 mg, 600 mg, Oral, QHS, Tat, Alm, MD, 600 mg at 08/18/23 2131   pantoprazole  (PROTONIX ) EC tablet 40 mg, 40 mg, Oral, Daily, Tat, David, MD, 40 mg at 08/19/23 9085   polyvinyl alcohol  (LIQUIFILM TEARS) 1.4 % ophthalmic solution 1 drop, 1 drop, Both Eyes, PRN, Tat, David, MD   potassium chloride  SA (KLOR-CON  M) CR tablet 20 mEq, 20 mEq, Oral, Daily, Tat, David, MD, 20 mEq at 08/19/23 0914   [COMPLETED] methylPREDNISolone  sodium succinate (SOLU-MEDROL ) 40 mg/mL  injection 40 mg, 40 mg, Intravenous, Q24H, 40 mg at 08/16/23 1743 **FOLLOWED BY** predniSONE  (DELTASONE ) tablet 30 mg, 30 mg, Oral, Q breakfast, 30 mg at 08/19/23 0913 **FOLLOWED BY** [START ON 08/24/2023] predniSONE  (DELTASONE ) tablet 20 mg, 20 mg, Oral, Q breakfast **FOLLOWED BY** [START ON 08/31/2023] predniSONE  (DELTASONE ) tablet 10 mg, 10 mg, Oral, Q breakfast, Tat, David, MD   senna-docusate (Senokot-S) tablet 1 tablet, 1 tablet, Oral, QHS PRN, Tat, David, MD   sodium chloride  flush (NS) 0.9 % injection 3 mL, 3 mL, Intravenous, Q12H, Tat, Alm, MD, 3 mL at 08/19/23 0917   sodium phosphate (FLEET) enema 1 enema, 1 enema, Rectal, Once PRN, Tat, Alm, MD  thiamine  (VITAMIN B1) injection 100 mg, 100 mg, Intravenous, Daily, Tat, David, MD, 100 mg at 08/19/23 9081   traMADol  (ULTRAM ) tablet 50 mg, 50 mg, Oral, Q6H PRN, Tat, David, MD, 50 mg at 08/19/23 0510  Patients Current Diet:  Diet Order             Diet Carb Modified Fluid consistency: Thin  Diet effective now                   Precautions / Restrictions Precautions Precautions: Fall Restrictions Weight Bearing Restrictions Per Provider Order: No   Has the patient had 2 or more falls or a fall with injury in the past year? Yes  Prior Activity Level Community (5-7x/wk): Went out daily  Prior Functional Level Self Care: Did the patient need help bathing, dressing, using the toilet or eating? Independent  Indoor Mobility: Did the patient need assistance with walking from room to room (with or without device)? Independent  Stairs: Did the patient need assistance with internal or external stairs (with or without device)? Independent  Functional Cognition: Did the patient need help planning regular tasks such as shopping or remembering to take medications? Independent  Patient Information Are you of Hispanic, Latino/a,or Spanish origin?: A. No, not of Hispanic, Latino/a, or Spanish origin What is your race?: A. White Do  you need or want an interpreter to communicate with a doctor or health care staff?: 0. No  Patient's Response To:  Health Literacy and Transportation Is the patient able to respond to health literacy and transportation needs?: Yes Health Literacy - How often do you need to have someone help you when you read instructions, pamphlets, or other written material from your doctor or pharmacy?: Never In the past 12 months, has lack of transportation kept you from medical appointments or from getting medications?: No In the past 12 months, has lack of transportation kept you from meetings, work, or from getting things needed for daily living?: No  Home Assistive Devices / Equipment Home Equipment: Agricultural Consultant (2 wheels), [employer] - single point  Prior Device Use: Indicate devices/aids used by the patient prior to current illness, exacerbation or injury? Walker and [employer]  Current Functional Level Cognition  Overall Cognitive Status: Within Functional Limits for tasks assessed Current Attention Level: Focused Orientation Level: Oriented X4    Extremity Assessment (includes Sensation/Coordination)  Upper Extremity Assessment: Generalized weakness (4+/5 grossly)  Lower Extremity Assessment: Defer to PT evaluation RLE Deficits / Details: 4-/5 knee extension MMT RLE Sensation: decreased light touch, decreased proprioception, history of peripheral neuropathy LLE Deficits / Details: 4-/5 knee extension MMT LLE Sensation: decreased light touch, decreased proprioception, history of peripheral neuropathy    ADLs  Overall ADL's : Needs assistance/impaired Eating/Feeding: Modified independent, Sitting Grooming: Set up, Sitting Upper Body Bathing: Set up, Sitting Lower Body Bathing: Set up, Sitting/lateral leans Upper Body Dressing : Set up, Sitting Lower Body Dressing: Set up, Sitting/lateral leans Toilet Transfer: Minimal assistance, Contact guard assist, Ambulation, Rolling walker (2 wheels) Toilet  Transfer Details (indicate cue type and reason): Simulated via EOB to chair and ambulation in the hall. Toileting- Clothing Manipulation and Hygiene: Set up, Sitting/lateral lean Tub/ Shower Transfer: Minimal assistance, Contact guard assist, Rolling walker (2 wheels), Ambulation Functional mobility during ADLs: Contact guard assist, Rolling walker (2 wheels) General ADL Comments: Able to ambulate around ICU ~over 100 feet with RW.    Mobility  Overal bed mobility: Modified Independent Bed Mobility: Supine to Sit, Sit to Supine Rolling:  Max assist Supine to sit: Mod assist Sit to supine: Supervision General bed mobility comments: moderate assist for supine to sit with flat bed level and trunk elevation. Supervision for return to supine from sitting position. Unsteady with BUE, pt given verbal and tactile cues for proper UB placement.    Transfers  Overall transfer level: Needs assistance Equipment used: Rolling walker (2 wheels) Transfers: Sit to/from Stand, Bed to chair/wheelchair/BSC Sit to Stand: Min assist Bed to/from chair/wheelchair/BSC transfer type:: Step pivot Step pivot transfers: Contact guard assist, Min assist General transfer comment: Min A to boost from EOB and chair. More CGA once standing.    Ambulation / Gait / Stairs / Wheelchair Mobility  Ambulation/Gait Ambulation/Gait assistance: Contact guard assist, Min assist Gait Distance (Feet): 85 Feet Assistive device: Rolling walker (2 wheels) Gait Pattern/deviations: WFL(Within Functional Limits), Step-through pattern, Decreased step length - right, Decreased step length - left, Decreased stance time - right, Decreased stance time - left, Shuffle General Gait Details: 85ft of ambulation with RW. Unsteady navigating closed environment out to hallway given min assist and cues for RW management.  Once in straight away area, pt ambulating with CGA, unsteady with turning.    Posture / Balance Dynamic Sitting Balance Sitting  balance - Comments: Sitting EOB Balance Overall balance assessment: Needs assistance Sitting-balance support: Feet supported, No upper extremity supported Sitting balance-Leahy Scale: Good Sitting balance - Comments: Sitting EOB Standing balance support: Bilateral upper extremity supported, During functional activity Standing balance-Leahy Scale: Fair Standing balance comment: fair with RW    Special needs/care consideration Skin Abrasion: leg/right; Scratch Marks: arm/bilateral; Ecchymosis: arm/bilateral; Diabetes Management: Novolog  0-6 units 3x daily with meals   Previous Home Environment (from acute therapy documentation) Living Arrangements: Alone Available Help at Discharge: Family, Available PRN/intermittently Type of Home: House Home Layout: Two level Alternate Level Stairs-Rails: Right Alternate Level Stairs-Number of Steps: 14 Home Access: Stairs to enter Entrance Stairs-Rails: Right, Left, Can reach both Entrance Stairs-Number of Steps: 4 Bathroom Shower/Tub: Associate Professor: Yes Home Care Services: No Additional Comments: Pt reports living in ranch-poor historian  Discharge Living Setting Plans for Discharge Living Setting: House, Alone Type of Home at Discharge: House Discharge Home Layout: One level Discharge Home Access: Stairs to enter Entrance Stairs-Rails: Right, Left, Can reach both Entrance Stairs-Number of Steps: 14 Discharge Bathroom Shower/Tub: Walk-in shower, Door Discharge Bathroom Toilet: Handicapped height Discharge Bathroom Accessibility: Yes How Accessible: Accessible via wheelchair, Accessible via walker Does the patient have any problems obtaining your medications?: No  Social/Family/Support Systems Patient Roles: Other (Comment) (Has mom and sister) Contact Information: Smaran Gaus - mother - [phone] Anticipated Caregiver: self Caregiver Availability: Intermittent Discharge Plan  Discussed with Primary Caregiver: Yes Is Caregiver In Agreement with Plan?: Yes Does Caregiver/Family have Issues with Lodging/Transportation while Pt is in Rehab?: No  Goals Patient/Family Goal for Rehab: PT/OT mod I goals Expected length of stay: 7-10 days Pt/Family Agrees to Admission and willing to participate: Yes Program Orientation Provided & Reviewed with Pt/Caregiver Including Roles  & Responsibilities: Yes  Decrease burden of Care through IP rehab admission: N/A  Possible need for SNF placement upon discharge: Not planned  Patient Condition: I have reviewed medical records from [facility], spoken with CM, and patient. I discussed via phone for inpatient rehabilitation assessment.  Patient will benefit from ongoing PT and OT, can actively participate in 3 hours of therapy a day 5 days of the week, and can make measurable gains during the  admission.  Patient will also benefit from the coordinated team approach during an Inpatient Acute Rehabilitation admission.  The patient will receive intensive therapy as well as Rehabilitation physician, nursing, social worker, and care management interventions.  Due to safety, skin/wound care, disease management, medication administration, pain management, and patient education the patient requires 24 hour a day rehabilitation nursing.  The patient is currently Contact G-Min A with mobility and Set up-Min A with basic ADLs.  Discharge setting and therapy post discharge at home with home health is anticipated.  Patient has agreed to participate in the Acute Inpatient Rehabilitation Program and will admit today.  Preadmission Screen Completed By:  Lovett CHRISTELLA Ropes, with updates by [name] Yvone Cohens 08/19/2023 9:46 AM ______________________________________________________________________   Discussed status with Dr. Harish Bram on 08/19/23  at 9:46 AM and received approval for admission today.  Admission Coordinator:  Lovett CHRISTELLA Ropes, RN, time 9:46  AM/Date 08/19/23    Assessment/Plan: Diagnosis: CIDP Does the need for close, 24 hr/day Medical supervision in concert with the patient's rehab needs make it unreasonable for this patient to be served in a less intensive setting? Yes Co-Morbidities requiring supervision/potential complications: Alcohol  use disorder with CIW protocol for withdrawal; new dx of DM- A1c 8.9; seizure d/o; overweight; tobacco abuse; gout- hyponatremia, hypokalemia and hypomagnesemia;nerve pain Due to bladder management, bowel management, safety, skin/wound care, disease management, medication administration, pain management, and patient education, does the patient require 24 hr/day rehab nursing? Yes Does the patient require coordinated care of a physician, rehab nurse, PT, OT, and SLP to address physical and functional deficits in the context of the above medical diagnosis(es)? Yes Addressing deficits in the following areas: balance, endurance, locomotion, strength, transferring, bowel/bladder control, bathing, dressing, feeding, grooming, toileting, and cognition Can the patient actively participate in an intensive therapy program of at least 3 hrs of therapy 5 days a week? Yes The potential for patient to make measurable gains while on inpatient rehab is good Anticipated functional outcomes upon discharge from inpatient rehab: modified independent PT, modified independent OT, n/a SLP Estimated rehab length of stay to reach the above functional goals is: 7-10 days, but has to be able ot go up 14 steps at home Anticipated discharge destination: Home 10. Overall Rehab/Functional Prognosis: good   MD Signature:

## 2023-08-14 NOTE — Evaluation (Signed)
 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 Transferred to ICU on 08/10/2023.   Clinical Impression   Pt agreeable to OT evaluation. Pt was pleasant and engaged well. Pt required no physical assist for bed mobility. Min A needed to boost from EOB and chair. Once standing pt ambulated with more CGA using the RW. Mild general weakness in B UE. Able to complete ADL's well from a seated position. Pt reported his mother could help as much as he really wanted. Reported wanting to go home from the hospital. Pt was left in the chair with the nurse present. Pt will benefit from continued OT in the hospital and recommended venue below to increase strength, balance, and endurance for safe ADL's.          If plan is discharge home, recommend the following: A little help with walking and/or transfers;A little help with bathing/dressing/bathroom;Assistance with cooking/housework;Assist for transportation;Help with stairs or ramp for entrance    Functional Status Assessment  Patient has had a recent decline in their functional status and demonstrates the ability to make significant improvements in function in a reasonable and predictable amount of time.  Equipment Recommendations  None recommended by OT;BSC/3in1 (BSC possibly if pt decides to go home.)           Precautions / Restrictions Precautions Precautions: Fall Restrictions Weight Bearing Restrictions Per Provider Order: No      Mobility Bed Mobility Overal bed mobility: Modified Independent                   Transfers Overall transfer level: Needs assistance Equipment used: Rolling walker (2 wheels) Transfers: Sit to/from Stand, Bed to chair/wheelchair/BSC Sit to Stand: Min assist     Step pivot transfers: Contact guard assist, Min assist     General transfer comment: Min A to boost from EOB and chair. More CGA once standing.      Balance Overall balance assessment: Needs assistance Sitting-balance support: Feet supported, No upper extremity supported Sitting balance-Leahy Scale: Good Sitting balance - Comments: Sitting EOB   Standing balance support: Bilateral upper extremity supported, During functional activity Standing balance-Leahy Scale: Fair Standing balance comment: fair with RW                           ADL either performed or assessed with clinical judgement   ADL Overall ADL's : Needs assistance/impaired Eating/Feeding: Modified independent;Sitting   Grooming: Set up;Sitting   Upper Body Bathing: Set up;Sitting   Lower Body Bathing: Set up;Sitting/lateral leans   Upper Body Dressing : Set up;Sitting   Lower Body Dressing: Set up;Sitting/lateral leans   Toilet Transfer: Minimal assistance;Contact guard assist;Ambulation;Rolling walker (2 wheels) Toilet Transfer Details (indicate cue type and reason): Simulated via EOB to chair and ambulation in the hall. Toileting- Clothing Manipulation and Hygiene: Set up;Sitting/lateral lean   Tub/ Shower Transfer: Minimal assistance;Contact guard assist;Rolling walker (2 wheels);Ambulation   Functional mobility during ADLs: Contact guard assist;Rolling walker (2 wheels) General ADL Comments: Able to ambulate around ICU ~over 100 feet with RW.     Vision Baseline Vision/History: 0 No visual deficits Ability to  See in Adequate Light: 0 Adequate Patient Visual Report: No change from baseline Vision Assessment?: No apparent visual deficits     Perception Perception: Not tested        Praxis Praxis: Not tested       Pertinent Vitals/Pain Pain Assessment Pain Assessment: Faces Faces Pain Scale: Hurts a little bit Pain Location: arms and legs Pain Descriptors / Indicators: Discomfort Pain Intervention(s): Monitored during session, Repositioned     Extremity/Trunk Assessment Upper Extremity Assessment Upper Extremity Assessment: Generalized weakness (4+/5 grossly)   Lower Extremity Assessment Lower Extremity Assessment: Defer to PT evaluation   Cervical / Trunk Assessment Cervical / Trunk Assessment: Kyphotic   Communication Communication Communication: No apparent difficulties   Cognition Arousal: Alert Behavior During Therapy: WFL for tasks assessed/performed Overall Cognitive Status: Within Functional Limits for tasks assessed                                                        Home Living Family/patient expects to be discharged to:: Private residence Living Arrangements: Alone Available Help at Discharge: Family;Available PRN/intermittently Type of Home: House Home Access: Stairs to enter [employer] of Steps: 4 Entrance Stairs-Rails: Right;Left;Can reach both Home Layout: Two level Alternate Level Stairs-Number of Steps: 14 Alternate Level Stairs-Rails: Right Bathroom Shower/Tub: Chief Strategy Officer: Standard Bathroom Accessibility: Yes   Home Equipment: Agricultural Consultant (2 wheels);Cane - single point          Prior Functioning/Environment Prior Level of Function : Independent/Modified Independent             Mobility Comments: Pt reports cane and RW ambulation. ADLs Comments: Reports independence with ADL's.        OT Problem List: Decreased strength;Decreased activity tolerance;Impaired balance (sitting and/or standing)      OT Treatment/Interventions: Self-care/ADL training;Therapeutic exercise;Therapeutic activities;Patient/family education    OT Goals(Current goals can  be found in the care plan section) Acute Rehab OT Goals Patient Stated Goal: return home OT Goal Formulation: With patient Time For Goal Achievement: 08/28/23 Potential to Achieve Goals: Good  OT Frequency: Min 1X/week                                   End of Session Equipment Utilized During Treatment: Gait belt;Rolling walker (2 wheels)  Activity Tolerance: Patient tolerated treatment well Patient left: in chair;with call bell/phone within reach;with nursing/sitter in room  OT Visit Diagnosis: Unsteadiness on feet (R26.81);Other abnormalities of gait and mobility (R26.89);Muscle weakness (generalized) (M62.81)                Time: 9157-9097 OT Time Calculation (min): 20 min Charges:  OT General Charges $OT Visit: 1 Visit OT Evaluation $OT Eval Low Complexity: 1 Low  Dacie Mandel OT, MOT  Jayson Person 08/14/2023, 9:42 AM

## 2023-08-14 NOTE — Progress Notes (Signed)
*  PRELIMINARY RESULTS* Echocardiogram 2D Echocardiogram has been performed.  Connor Copeland 08/14/2023, 12:24 PM

## 2023-08-14 NOTE — Consult Note (Addendum)
 Cardiology Consultation   Patient ID: Connor Copeland MRN: [id number]; DOB: [birth year]  Admit date: 08/08/2023 Date of Consult: 08/14/2023  PCP:  Connor Norleen PEDLAR, MD   [facility] Providers Cardiologist:  Connor SHAUNNA Maywood, MD        Patient Profile:   Connor Copeland is a 48 y.o. 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 Evonnie.  History of Present Illness:   Connor Copeland presented to [facility] on 08/07/2022 by EMS after they have been called for alcohol  intoxication by the patient's family. Was felt to be in withdrawal and started on CIWA protocol upon admission. 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  Was started on IVIG along with Oxycarbazepine at the time of admission.   He developed worsening DT's on 08/09/2023 and required transfer to the ICU and was started on Precedex  drip  and Phenobarbital . By review of notes, Precedex  drip was stopped yesterday morning. It does appear he started having episodes of SVT and was started on short-acting Cardizem  30 mg every 6 hours by the hospitalist team yesterday. Labs yesterday showed WBC 4.3, Hgb 10.0 and platelets 250. K+ was significantly low at 2.8 and Na+ at 130. TSH was checked earlier this admission and normal at 0.911.  Follow-up labs today show Na+ remains stable at 130. K+ still low at 3.3 and Mg at 1.2.  In talking with the patient today, he reports having more frequent dizziness yesterday but denies any specific chest pain or palpitations when tachycardiac. Denies any symptoms of palpitations PTA. No specific dyspnea, orthopnea, PND or pitting edema. Reports his biggest issue has been progressive arm and leg weakness in the setting of recently diagnosed CIDP. Does reports consuming at  least 3 mixed drinks daily and a prior note mentioned over 40 alcoholic beverages per week. Denies any recreational drugs (UDS + for THC).     Past Medical History:  Diagnosis Date   ETOH abuse    Gout     Past Surgical History:  Procedure Laterality Date   FOOT SURGERY       Home Medications:  Prior to Admission medications   Medication Sig Start Date End Date Taking? Authorizing Provider  DULoxetine  (CYMBALTA ) 60 MG capsule Take 1 capsule (60 mg total) by mouth daily. 03/19/23  Yes Onita Duos, MD  fluticasone (FLONASE) 50 MCG/ACT nasal spray Place 1 spray into both nostrils daily. 07/15/23  Yes [provider]  HYDROcodone -acetaminophen  (NORCO) 7.5-325 MG tablet Take 1 tablet by mouth 3 (three) times daily as needed. 08/01/23  Yes [provider]  nicotine  (NICODERM CQ  - DOSED IN MG/24 HOURS) 21 mg/24hr patch Place 1 patch (21 mg total) onto the skin daily. 02/04/23  Yes Tat, Alm, MD  nortriptyline  (PAMELOR ) 25 MG capsule Take 2 capsules (50 mg total) by mouth at bedtime. 03/19/23  Yes Onita Duos, MD  Oxcarbazepine  (TRILEPTAL ) 300 MG tablet one po qAM and two po qHS 08/04/23  Yes Sater, Charlie LABOR, MD  predniSONE  (DELTASONE ) 20 MG tablet Take 4 pills (80 mg) p.o. daily for 6 weeks.  Then take 3 pills p.o. daily x 2 weeks.  Then take 2 pills p.o. daily x 2 weeks.  Then take 1 pill a day until the follow-up visit with your neurologist 04/16/23  Yes Sater, Charlie LABOR, MD  traMADol  (ULTRAM ) 50 MG tablet Take 1 tablet (50 mg total) by mouth in the morning, at noon, in the evening, and at bedtime. 05/06/23  Yes Sater, Charlie LABOR, MD  ULORIC  80 MG TABS Take 40 mg by mouth daily. 04/02/23  Yes [provider]    Inpatient Medications: Scheduled Meds:  Chlorhexidine  Gluconate Cloth  6 each Topical Daily   diltiazem   30 mg Oral Q6H   DULoxetine   60 mg Oral Daily   febuxostat   40 mg Oral Daily   folic acid   1 mg Oral Daily   heparin   5,000 Units Subcutaneous Q8H    insulin  aspart  0-6 Units Subcutaneous TID WC   insulin  starter kit- pen needles  1 kit Other Once   linagliptin   5 mg Oral Daily   methylPREDNISolone  (SOLU-MEDROL ) injection  40 mg Intravenous Q24H   Followed by   NOREEN ON 08/17/2023] predniSONE   30 mg Oral Q breakfast   Followed by   NOREEN ON 08/24/2023] predniSONE   20 mg Oral Q breakfast   Followed by   NOREEN ON 08/31/2023] predniSONE   10 mg Oral Q breakfast   multivitamin with minerals  1 tablet Oral Daily   nicotine   21 mg Transdermal Daily   nortriptyline   50 mg Oral QHS   Oxcarbazepine   300 mg Oral Daily   OXcarbazepine   600 mg Oral QHS   pantoprazole  (PROTONIX ) IV  40 mg Intravenous Q24H   PHENObarbital   32.5 mg Intravenous Q8H   sodium chloride  flush  3 mL Intravenous Q12H   thiamine  (VITAMIN B1) injection  100 mg Intravenous Daily   Continuous Infusions:  dexmedetomidine  (PRECEDEX ) IV infusion Stopped (08/13/23 0616)   magnesium  sulfate bolus IVPB 2 g (08/14/23 0858)   PRN Meds: acetaminophen  **OR** acetaminophen , bisacodyl , hydrALAZINE , HYDROcodone -acetaminophen , HYDROmorphone  (DILAUDID ) injection, ipratropium, levalbuterol , ondansetron  **OR** ondansetron  (ZOFRAN ) IV, mouth rinse, polyvinyl alcohol , senna-docusate, sodium phosphate, traMADol   Allergies:    Allergies  Allergen Reactions   Gabapentin Swelling    Swelling off the legs    Social History:   Social History   Socioeconomic History   Marital status: Divorced    Spouse name: Not on file   Number of children: Not on file   Years of education: Not on file   Highest education level: Not on file  Occupational History   Not on file  Tobacco Use   Smoking status: Every Day    Current packs/day: 1.00    Types: Cigarettes   Smokeless tobacco: Never  Substance and Sexual Activity   Alcohol  use: Yes    Alcohol /week: 42.0 standard drinks of alcohol     Types: 42 Standard drinks or equivalent per week    Comment: 5-6 glasses of bourbon daily   Drug use:  No   Sexual activity: Not on file  Other Topics Concern   Not on file  Social History Narrative   Right Handed   No Caffeine    Social Drivers of Health   Financial Resource Strain: Not on file  Food Insecurity: No Food Insecurity (08/08/2023)   Hunger Vital Sign    Worried About Running Out of Food in the Last Year: Never true    Ran Out of Food in the Last Year: Never true  Transportation Needs: No Transportation Needs (08/08/2023)   PRAPARE - Administrator, Civil Service (Medical): No    Lack of Transportation (Non-Medical): No  Physical Activity: Not on file  Stress: Not on  file  Social Connections: Not on file  Intimate Partner Violence: Not At Risk (08/08/2023)   Humiliation, Afraid, Rape, and Kick questionnaire    Fear of Current or Ex-Partner: No    Emotionally Abused: No    Physically Abused: No    Sexually Abused: No    Family History:    Family History  Problem Relation Age of Onset   COPD Father      ROS:  Please see the history of present illness.   All other ROS reviewed and negative.     Physical Exam/Data:   Vitals:   08/14/23 0200 08/14/23 0700 08/14/23 0759 08/14/23 0800  BP: 124/78 101/89 (!) 129/93 (!) 129/93  Pulse: (!) 109 (!) 109 100   Resp: 11 12 20    Temp:   (!) 97.3 F (36.3 C)   TempSrc:   Oral   SpO2: 94% 90% 100%   Weight:      Height:        Intake/Output Summary (Last 24 hours) at 08/14/2023 0918 Last data filed at 08/14/2023 9096 Gross per 24 hour  Intake 129 ml  Output --  Net 129 ml      08/13/2023    4:33 AM 08/12/2023    5:00 AM 08/11/2023    5:00 AM  Last 3 Weights  Weight (lbs) 242 lb 15.2 oz 240 lb 1.3 oz 239 lb 3.2 oz  Weight (kg) 110.2 kg 108.9 kg 108.5 kg     Body mass index is 31.19 kg/m.  General:  Well nourished, well developed male appearing in no acute distress HEENT: normal Neck: no JVD Vascular: No carotid bruits; Distal pulses 2+ bilaterally Cardiac:  normal S1, S2; RRR; no murmur  Lungs:   clear to auscultation bilaterally, no wheezing, rhonchi or rales  Abd: soft, nontender, no hepatomegaly  Ext: no pitting edema Musculoskeletal:  No deformities, BUE and BLE strength normal and equal Skin: warm and dry  Neuro:  CNs 2-12 intact, no focal abnormalities noted Psych:  Normal affect   EKG:  The EKG was personally reviewed and demonstrates: Sinus tachycardia, HR 110 with fascicular block. No acute ST changes.   Telemetry:  Telemetry was personally reviewed and demonstrates:  Sinus tachycardia, HR in low-100's to 110's. Episodes of narrow-complex tachycardia appearing consistent with SVT. Episode on 1/8 around 1500 lasting for 30 minutes with HR in the 150's.   Relevant CV Studies:  Cardiac Calcium  Score: 11/2022 FINDINGS: Coronary arteries: Normal origins.   Coronary Calcium  Score:   Left main: 23.7   Left anterior descending artery: 17.2   Left circumflex artery: 0   Right coronary artery: 117   Total: 158   Percentile: 98   Pericardium: Normal.   Ascending Aorta: Normal caliber.   Non-cardiac: See separate report from [facility].   IMPRESSION: Coronary calcium  score of 158. This was 71 percentile for age-, race-, and sex-matched controls.  Echocardiogram: 01/2023 IMPRESSIONS     1. Left ventricular ejection fraction, by estimation, is 65 to 70%. The  left ventricle has normal function. The left ventricle has no regional  wall motion abnormalities. There is mild concentric left ventricular  hypertrophy. Left ventricular diastolic  parameters are consistent with Grade I diastolic dysfunction (impaired  relaxation).   2. Right ventricular systolic function is normal. The right ventricular  size is normal. Tricuspid regurgitation signal is inadequate for assessing  PA pressure.   3. The mitral valve is grossly normal. Trivial mitral valve  regurgitation.   4.  The aortic valve was not well visualized, likely tricuspid. Aortic  valve  regurgitation is not visualized. No aortic stenosis is present.  Aortic valve mean gradient measures 3.0 mmHg.   5. Aortic dilatation noted. There is mild dilatation of the aortic root,  measuring 41 mm.   6. The inferior vena cava is normal in size with greater than 50%  respiratory variability, suggesting right atrial pressure of 3 mmHg.   Comparison(s): No prior Echocardiogram.   Laboratory Data:  High Sensitivity Troponin:  No results for input(s): TROPONINIHS in the last 720 hours.   Chemistry Recent Labs  Lab 08/08/23 1239 08/09/23 0336 08/10/23 0805 08/11/23 0426 08/12/23 0503 08/13/23 0435 08/14/23 0451  NA 127*  125*   < >  --    < > 130* 130* 130*  K 2.6*   < >  --    < > 3.1* 2.8* 3.3*  CL 78*   < >  --    < > 93* 93* 96*  CO2 26   < >  --    < > 27 29 27   GLUCOSE 559*   < >  --    < > 172* 146* 131*  BUN 10   < >  --    < > 10 10 9   CREATININE 0.82   < >  --    < > 0.50* 0.52* 0.58*  CALCIUM  9.2   < >  --    < > 7.7* 7.8* 8.0*  MG 1.7  --  1.7  --   --   --  1.2*  GFRNONAA >60   < >  --    < > >60 >60 >60  ANIONGAP 21*   < >  --    < > 10 8 7    < > = values in this interval not displayed.    Recent Labs  Lab 08/09/23 2152 08/10/23 0604  PROT 6.6 5.9*  ALBUMIN  2.6* 2.3*  AST 59* 57*  ALT 56* 50*  ALKPHOS 113 94  BILITOT 1.4* 1.3*   Lipids  Recent Labs  Lab 08/09/23 0343  CHOL 131  TRIG 165*  HDL 62  LDLCALC 36  CHOLHDL 2.1    Hematology Recent Labs  Lab 08/11/23 0426 08/12/23 0503 08/13/23 0435  WBC 4.1 4.0 4.3  RBC 3.29* 3.40* 3.26*  HGB 10.2* 10.4* 10.0*  HCT 30.7* 31.8* 30.3*  MCV 93.3 93.5 92.9  MCH 31.0 30.6 30.7  MCHC 33.2 32.7 33.0  RDW 14.6 15.0 15.4  PLT 256 235 250   Thyroid  Recent Labs  Lab 08/08/23 1239  TSH 0.911    BNPNo results for input(s): BNP, PROBNP in the last 168 hours.  DDimer No results for input(s): DDIMER in the last 168 hours.   Radiology/Studies:  No results found.   Assessment and  Plan:   1. Narrow-Complex Tachycardia - Newly diagnosed this admission and rhythm strips appear most consistent with SVT with HR in the 150's. Suspect secondary to his acute illness, alcohol  withdrawal requiring Precedex  drip and multiple electrolyte abnormalities. K+ was at 2.8 yesterday and at 3.3 today. Will order supplementation and try to keep K+ ~ 4.0. Mg also low at 1.2 and will order supplementation to try to keep ~ 2.0. TSH WNL. Echocardiogram pending. - He has been started on short-acting Cardizem  30mg  Q6H (was not on AV nodal blocking agents prior to admission). He is on Phenobarbital  for alcohol  withdrawal and by review of interaction checker, this  and beta blockers can interact with it but it appears Phenobarbital  reduces the effects of Cardizem  and beta-blockers. Appears Phenobarbital  is planned only as a short-term taper as well. Can switch to long-acting Cardizem  CD or Toprol-XL prior to discharge pending echo results.   2. CIDP Exacerbation - Treated with IVIG this admission and currently on a Prednisone  taper. Management per Neurology and the admitting team.   3. Alcohol  Withdrawal - Previously required Precedex  but now discontinued. Management per the admitting team.   4. Hypokalemia/Hypomagnesemia - K+ at 3.3 and Mg at 1.2. Will order supplementation.   5. CAD - Prior CT Calcium  score was elevated at 158 in 11/2022. Dr. Mallipeddi previously recommended statin therapy in 12/2022 but the patient declined. LDL was at 36 when checked this admission but not listed as taking anything for HLD prior to admission (?).    For questions or updates, please contact [facility] Please consult [email or web address] for contact info under    Signed, Laymon CHRISTELLA Qua, PA-C  08/14/2023 9:18 AM   Attending Note  Patient seen and discussed with PA Qua, I agree with her documentation. History of HLD, EtoH abuse, progressive inflammatory demyelinating polyneuropathy chronically on  steroids , seizure disorder, admitted Jan 3 with progressive weakness related to flare of his crhonic inflammatory demyelinating polyneuropathy. Admission also complicated by EtOH withdrawal. Issues with PSVT this admission, cardiology consulted to help manage    K 3.3 Cr 0.58 BUN 9 Mg 1.2 TSH 0.911  EKG sinus tach 110    1.PSVT - episodes noted on telemetry, patient is asymptomatic.  - in setting of multiple electolyte abnormalities, low K and Mg. EtOH withdrawal. High dose steroids for inflammatory neuropathy flare - normalize electrolytes - started on oral diltiazem  short acting. Continue and monitor rates and bp's, consolidate to long acting later in admission - f/u echo    Dorn Ross MD

## 2023-08-14 NOTE — Inpatient Diabetes Management (Signed)
 Inpatient Diabetes Program Recommendations  AACE/ADA: New Consensus Statement on Inpatient Glycemic Control (2015)  Target Ranges:  Prepandial:   less than 140 mg/dL      Peak postprandial:   less than 180 mg/dL (1-2 hours)      Critically ill patients:  140 - 180 mg/dL   Lab Results  Component Value Date   GLUCAP 118 (H) 08/14/2023   HGBA1C 8.9 (H) 08/08/2023    Review of Glycemic Control  Diabetes history: No hx DM Outpatient Diabetes medications: None Current orders for Inpatient glycemic control: Novolog  0-6 TID, tradjenta  5 mg daily  HgbA1C - 8.9% Blood sugars trending well.  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 Pt states he didn't know he had diabetes, no one has said anything to him about high blood sugars. Spoke with him about steroids increasing blood sugars and it appears his blood sugars have been elevated for the past 2-3 months with HgbA1C 8.9%. Instructed pt to monitor his blood sugars 1-2x/day while he's on steroids. Pt had no questions and states I'm leaving the hospital today.  For home:  Tradjenta  5 mg daily  Monitor blood sugars twice/day and take logbook to PCP for review.  F/U with PCP within a week or two of discharge.   Thank you. Shona Brandy, RD, LDN, CDCES Inpatient Diabetes Coordinator [phone]

## 2023-08-14 NOTE — Progress Notes (Signed)
 [facility] IP rehab admissions - I spoke with patient by phone.  He lives alone.  He would like inpatient rehab at [facility] prior to home alone.  I think he could get to Mod I goals.  Will need [facility] pre-authorization prior to CIR.  When do we anticipate medical readiness for CIR?  [phone]

## 2023-08-15 DIAGNOSIS — E871 Hypo-osmolality and hyponatremia: Secondary | ICD-10-CM

## 2023-08-15 DIAGNOSIS — I471 Supraventricular tachycardia, unspecified: Secondary | ICD-10-CM | POA: Diagnosis not present

## 2023-08-15 DIAGNOSIS — G6181 Chronic inflammatory demyelinating polyneuritis: Secondary | ICD-10-CM | POA: Diagnosis not present

## 2023-08-15 LAB — BASIC METABOLIC PANEL
Anion gap: 5 (ref 5–15)
BUN: 10 mg/dL (ref 6–20)
CO2: 26 mmol/L (ref 22–32)
Calcium: 7.8 mg/dL — ABNORMAL LOW (ref 8.9–10.3)
Chloride: 99 mmol/L (ref 98–111)
Creatinine, Ser: 0.54 mg/dL — ABNORMAL LOW (ref 0.61–1.24)
GFR, Estimated: >60 mL/min (ref >60)
Glucose, Bld: 143 mg/dL — ABNORMAL HIGH (ref 70–99)
Potassium: 3.7 mmol/L (ref 3.5–5.1)
Sodium: 130 mmol/L — ABNORMAL LOW (ref 135–145)

## 2023-08-15 LAB — GLUCOSE, CAPILLARY
Glucose-Capillary: 114 mg/dL — ABNORMAL HIGH (ref 70–99)
Glucose-Capillary: 144 mg/dL — ABNORMAL HIGH (ref 70–99)
Glucose-Capillary: 115 mg/dL — ABNORMAL HIGH (ref 70–99)
Glucose-Capillary: 231 mg/dL — ABNORMAL HIGH (ref 70–99)

## 2023-08-15 LAB — MAGNESIUM: Magnesium: 1.8 mg/dL (ref 1.7–2.4)

## 2023-08-15 MED ORDER — PHENOBARBITAL 32.4 MG PO TABS
32.4000 mg | ORAL_TABLET | Freq: Once | ORAL | Status: AC
  Administered 2023-08-15: 32.4 mg via ORAL
  Filled 2023-08-15: qty 1

## 2023-08-15 MED ORDER — PANTOPRAZOLE SODIUM 40 MG PO TBEC
40.0000 mg | DELAYED_RELEASE_TABLET | Freq: Every day | ORAL | Status: DC
  Administered 2023-08-16 – 2023-08-19 (×4): 40 mg via ORAL
  Filled 2023-08-15 (×4): qty 1

## 2023-08-15 MED ORDER — DILTIAZEM HCL ER COATED BEADS 120 MG PO CP24
120.0000 mg | ORAL_CAPSULE | Freq: Every day | ORAL | Status: DC
  Administered 2023-08-15 – 2023-08-19 (×5): 120 mg via ORAL
  Filled 2023-08-15 (×5): qty 1

## 2023-08-15 MED ORDER — MAGNESIUM SULFATE 2 GM/50ML IV SOLN
2.0000 g | Freq: Once | INTRAVENOUS | Status: AC
  Administered 2023-08-15: 2 g via INTRAVENOUS
  Filled 2023-08-15: qty 50

## 2023-08-15 MED ORDER — POTASSIUM CHLORIDE CRYS ER 20 MEQ PO TBCR
40.0000 meq | EXTENDED_RELEASE_TABLET | Freq: Four times a day (QID) | ORAL | Status: AC
  Administered 2023-08-15 (×2): 40 meq via ORAL
  Filled 2023-08-15 (×2): qty 2

## 2023-08-15 NOTE — Progress Notes (Signed)
 [facility] IP rehab admissions - we have opened the case with [facility] and are awaiting call back from insurance case manager regarding potential inpatient rehab admission.  [phone]

## 2023-08-15 NOTE — Progress Notes (Signed)
 Rounding Note    Patient Name: Connor Copeland Date of Encounter: 08/15/2023  [facility] Cardiologist: Diannah SHAUNNA Maywood, MD   Subjective   No complaints  Inpatient Medications    Scheduled Meds:  Chlorhexidine  Gluconate Cloth  6 each Topical Daily   diltiazem   30 mg Oral Q6H   DULoxetine   60 mg Oral Daily   febuxostat   40 mg Oral Daily   folic acid   1 mg Oral Daily   heparin   5,000 Units Subcutaneous Q8H   insulin  aspart  0-6 Units Subcutaneous TID WC   insulin  starter kit- pen needles  1 kit Other Once   linagliptin   5 mg Oral Daily   methylPREDNISolone  (SOLU-MEDROL ) injection  40 mg Intravenous Q24H   Followed by   NOREEN ON 08/17/2023] predniSONE   30 mg Oral Q breakfast   Followed by   NOREEN ON 08/24/2023] predniSONE   20 mg Oral Q breakfast   Followed by   NOREEN ON 08/31/2023] predniSONE   10 mg Oral Q breakfast   multivitamin with minerals  1 tablet Oral Daily   nicotine   21 mg Transdermal Daily   nortriptyline   50 mg Oral QHS   Oxcarbazepine   300 mg Oral Daily   OXcarbazepine   600 mg Oral QHS   pantoprazole  (PROTONIX ) IV  40 mg Intravenous Q24H   PHENObarbital   32.5 mg Intravenous Q8H   sodium chloride  flush  3 mL Intravenous Q12H   thiamine  (VITAMIN B1) injection  100 mg Intravenous Daily   Continuous Infusions:  dexmedetomidine  (PRECEDEX ) IV infusion Stopped (08/13/23 0616)   PRN Meds: acetaminophen  **OR** acetaminophen , bisacodyl , hydrALAZINE , HYDROcodone -acetaminophen , HYDROmorphone  (DILAUDID ) injection, ipratropium, levalbuterol , ondansetron  **OR** ondansetron  (ZOFRAN ) IV, mouth rinse, polyvinyl alcohol , senna-docusate, sodium phosphate, traMADol    Vital Signs    Vitals:   08/15/23 0442 08/15/23 0528 08/15/23 0600 08/15/23 0700  BP:  (!) 149/136 (!) 135/99 114/74  Pulse:  87 88 88  Resp:  (!) 22 (!) 0 13  Temp:      TempSrc:      SpO2:  100% 96% 94%  Weight: 104.2 kg     Height:        Intake/Output Summary (Last 24 hours)  at 08/15/2023 0748 Last data filed at 08/15/2023 0443 Gross per 24 hour  Intake 539.07 ml  Output 675 ml  Net -135.93 ml      08/15/2023    4:42 AM 08/13/2023    4:33 AM 08/12/2023    5:00 AM  Last 3 Weights  Weight (lbs) 229 lb 11.5 oz 242 lb 15.2 oz 240 lb 1.3 oz  Weight (kg) 104.2 kg 110.2 kg 108.9 kg      Telemetry    SR - Personally Reviewed  ECG    N/a - Personally Reviewed  Physical Exam   GEN: No acute distress.   Neck: No JVD Cardiac: RRR, no murmurs, rubs, or gallops.  Respiratory: Clear to auscultation bilaterally. GI: Soft, nontender, non-distended  MS: No edema; No deformity. Neuro:  Nonfocal  Psych: Normal affect   Labs    High Sensitivity Troponin:  No results for input(s): TROPONINIHS in the last 720 hours.   Chemistry Recent Labs  Lab 08/09/23 2152 08/10/23 0604 08/10/23 0805 08/11/23 0426 08/13/23 0435 08/14/23 0451 08/15/23 0445  NA 129* 130*  --    < > 130* 130* 130*  K 3.2* 2.8*  --    < > 2.8* 3.3* 3.7  CL 90* 91*  --    < >  93* 96* 99  CO2 29 29  --    < > 29 27 26   GLUCOSE 188* 166*  --    < > 146* 131* 143*  BUN 17 16  --    < > 10 9 10   CREATININE 0.65 0.51*  --    < > 0.52* 0.58* 0.54*  CALCIUM  8.5* 8.1*  --    < > 7.8* 8.0* 7.8*  MG  --   --  1.7  --   --  1.2* 1.8  PROT 6.6 5.9*  --   --   --   --   --   ALBUMIN  2.6* 2.3*  --   --   --   --   --   AST 59* 57*  --   --   --   --   --   ALT 56* 50*  --   --   --   --   --   ALKPHOS 113 94  --   --   --   --   --   BILITOT 1.4* 1.3*  --   --   --   --   --   GFRNONAA >60 >60  --    < > >60 >60 >60  ANIONGAP 10 10  --    < > 8 7 5    < > = values in this interval not displayed.    Lipids  Recent Labs  Lab 08/09/23 0343  CHOL 131  TRIG 165*  HDL 62  LDLCALC 36  CHOLHDL 2.1    Hematology Recent Labs  Lab 08/11/23 0426 08/12/23 0503 08/13/23 0435  WBC 4.1 4.0 4.3  RBC 3.29* 3.40* 3.26*  HGB 10.2* 10.4* 10.0*  HCT 30.7* 31.8* 30.3*  MCV 93.3 93.5 92.9  MCH 31.0  30.6 30.7  MCHC 33.2 32.7 33.0  RDW 14.6 15.0 15.4  PLT 256 235 250   Thyroid  Recent Labs  Lab 08/08/23 1239  TSH 0.911    BNPNo results for input(s): BNP, PROBNP in the last 168 hours.  DDimer No results for input(s): DDIMER in the last 168 hours.   Radiology    ECHOCARDIOGRAM COMPLETE Result Date: 08/14/2023    ECHOCARDIOGRAM REPORT   Patient Name:   Connor Copeland Date of Exam: 08/14/2023 Medical Rec #:  [id number]              Height:       74.0 in Accession #:    [id number]             Weight:       242.9 lb Date of Birth:  July 16, 1976              BSA:          2.361 m Patient Age:    47 years               BP:           117/71 mmHg Patient Gender: M                      HR:           100 bpm. Exam Location:  [facility] Procedure: 2D Echo, Cardiac Doppler and Color Doppler Indications:    SVT (supraventricular tachycardia) (HCC)  History:        Patient has prior history of Echocardiogram examinations, most  recent 01/22/2023. ETOH abuse; Risk Factors:Current Smoker and                 Dyslipidemia.  Sonographer:    Tillman Nora RVT RCS Referring Phys: [phone] DAVID TAT  Sonographer Comments: Technically difficult study due to poor echo windows, Technically challenging study due to limited acoustic windows, suboptimal parasternal window, suboptimal apical window, suboptimal subcostal window and patient is obese. Image acquisition challenging due to uncooperative patient. IMPRESSIONS  1. Left ventricular ejection fraction, by estimation, is 60 to 65%. The left ventricle has normal function. The left ventricle has no regional wall motion abnormalities. There is mild left ventricular hypertrophy. Left ventricular diastolic parameters are consistent with Grade I diastolic dysfunction (impaired relaxation).  2. Right ventricular systolic function is normal. The right ventricular size is normal.  3. The mitral valve is normal in structure. No evidence of mitral valve  regurgitation. No evidence of mitral stenosis.  4. The aortic valve is tricuspid. Aortic valve regurgitation is not visualized. No aortic stenosis is present. FINDINGS  Left Ventricle: Left ventricular ejection fraction, by estimation, is 60 to 65%. The left ventricle has normal function. The left ventricle has no regional wall motion abnormalities. The left ventricular internal cavity size was normal in size. There is  mild left ventricular hypertrophy. Left ventricular diastolic parameters are consistent with Grade I diastolic dysfunction (impaired relaxation). Normal left ventricular filling pressure. Right Ventricle: The right ventricular size is normal. Right vetricular wall thickness was not well visualized. Right ventricular systolic function is normal. Left Atrium: Left atrial size was normal in size. Right Atrium: Right atrial size was normal in size. Pericardium: There is no evidence of pericardial effusion. Mitral Valve: The mitral valve is normal in structure. No evidence of mitral valve regurgitation. No evidence of mitral valve stenosis. Tricuspid Valve: The tricuspid valve is normal in structure. Tricuspid valve regurgitation is not demonstrated. No evidence of tricuspid stenosis. Aortic Valve: The aortic valve is tricuspid. Aortic valve regurgitation is not visualized. No aortic stenosis is present. Aortic valve mean gradient measures 2.0 mmHg. Aortic valve peak gradient measures 4.4 mmHg. Aortic valve area, by VTI measures 3.08 cm. Pulmonic Valve: The pulmonic valve was not well visualized. Pulmonic valve regurgitation is not visualized. No evidence of pulmonic stenosis. Aorta: The aortic root is normal in size and structure. IAS/Shunts: The interatrial septum was not well visualized.  LEFT VENTRICLE PLAX 2D LVIDd:         4.30 cm   Diastology LVIDs:         3.00 cm   LV e' medial:    6.53 cm/s LV PW:         1.20 cm   LV E/e' medial:  8.7 LV IVS:        1.30 cm   LV e' lateral:   7.18 cm/s LVOT  diam:     2.10 cm   LV E/e' lateral: 7.9 LV SV:         48 LV SV Index:   20 LVOT Area:     3.46 cm  RIGHT VENTRICLE RV S prime:     14.50 cm/s LEFT ATRIUM             Index LA diam:        3.20 cm 1.36 cm/m LA Vol (A2C):   41.7 ml 17.66 ml/m LA Vol (A4C):   46.3 ml 19.61 ml/m LA Biplane Vol: 44.7 ml 18.93 ml/m  AORTIC VALVE  PULMONIC VALVE AV Area (Vmax):    3.05 cm     PV Vmax:       0.92 m/s AV Area (Vmean):   3.03 cm     PV Peak grad:  3.4 mmHg AV Area (VTI):     3.08 cm AV Vmax:           105.00 cm/s AV Vmean:          70.600 cm/s AV VTI:            0.155 m AV Peak Grad:      4.4 mmHg AV Mean Grad:      2.0 mmHg LVOT Vmax:         92.40 cm/s LVOT Vmean:        61.800 cm/s LVOT VTI:          0.138 m LVOT/AV VTI ratio: 0.89  AORTA Ao Root diam: 3.80 cm Ao Asc diam:  3.50 cm MITRAL VALVE MV Area (PHT): 4.60 cm    SHUNTS MV Decel Time: 165 msec    Systemic VTI:  0.14 m MV E velocity: 56.90 cm/s  Systemic Diam: 2.10 cm MV A velocity: 88.10 cm/s MV E/A ratio:  0.65 Dorn Ross MD Electronically signed by Dorn Ross MD Signature Date/Time: 08/14/2023/12:31:06 PM    Final     Cardiac Studies    Patient Profile     Connor Copeland is a 48 y.o. 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 Evonnie.   Assessment & Plan    1.PSVT - episodes noted on telemetry, patient is asymptomatic.  - in setting of multiple electolyte abnormalities, low K and Mg. EtOH withdrawal. High dose steroids for inflammatory neuropathy flare - normalize electrolytes, giving additional KCl 40mEq x 2 and 2 g of Mg.  Keep at least 4 and Mg at least 2.  - started on oral diltiazem  short acting. We will consolidate to diltiazem  long acting 120mg  starting tomorrow since got AM short acting dose.  - echo was benign  No further  cardiac recommendations at this time, we will sign off inpatient care and arrange outpatient f/u.   For questions or updates, please contact [facility] Please consult [email or web address] for contact info under        Signed, Ross Dorn, MD  08/15/2023, 7:48 AM

## 2023-08-15 NOTE — Progress Notes (Signed)
 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   ED evaluation: Blood pressure (!) 159/147, pulse (!) 109, temperature 98.9 F (37.2 C), temperature source Oral, resp. rate 18, SpO2 94%.  LABs; sodium 125, potassium 2.6, chloride 78, serum glucose 559, anion gap 21, WBC 10.2, UA unremarkable with exception of greater than 500 glucose Urine drug screen positive for opioids, marijuana  Neurology was consulted.  They recommended 5-day course of IVIG.  The patient was continued on his home antiepileptic medications and steroids.  Overall, he did show some improvement with his weakness, but continued to have deconditioning.  PT and OT were consulted and recommended CIR.  His hospitalization was also prolonged secondary to development of atrial tachycardia and SVT.       Assessment/Plan: CIDP exacerbation -Patient finished 5 days of IVIG--last dose 08/12/2023 -Resume prednisone  taper 40mg  currently, taper by 10mg  every week to stop  -Outpatient follow-up with Dr. Vear -Appreciate neurology consult   Alcohol  abuse and withdrawal -Patient required Precedex  and phenobarbital  -Finish phenobarbital  taper -Cessation discussed -Continue thiamine  and multivitamin -now improved   SVT/atrial tachycardia -Start short acting diltiazem >>long acting on 1/10 -TSH 0.9111 -1/9 Echo EF 60-65%, no WMA, G1DD, normal RVF -appreciate cardiology   Gait instability/deconditioning -PT/OT evaluation -Planning for CIR if insurance approves   Diabetes mellitus type  2 -No previous history of diabetes -08/08/2023 hemoglobin A1c 8.9 -Continue NovoLog  sliding scale -In part due to the patient's steroids -add tradjenta    Hyponatremia -Secondary to hyperglycemia and chronic steroid use -Overall stable   Peripheral neuropathy - Home medications Cymbalta , as needed oxycodone , Ultram , -Per neurology recommendation continue oxcarbazepine  And Lamictal    Gouty arthritis -Continue Uloric    Hypokalemia/Hypomagnesemia -Repleting -Check magnesium  1.2 -repeat mag   Tobacco abuse -Tobacco cessation discussed -NicoDerm patch   Low folic acid  -Repleting   Peripheral neuropathy/neuropathic pain - Home medications Cymbalta , as needed hydrocodone  -Per neurology recommendation continue oxcarbazepine  And Lamictal    Lactic acidosis -sepsis ruled out -improved with IVF   Obesity -BMI 31.19 -lifestyle modification                   Family Communication:   mother updated  1/10   Consultants:  neurology   Code Status:  FULL   DVT Prophylaxis:  [place] Heparin      Procedures: As Listed in Progress Note Above   Antibiotics: None            Subjective: Patient denies fevers, chills, headache, chest pain, dyspnea, nausea, vomiting, diarrhea, abdominal pain, dysuria, hematuria, hematochezia, and melena.   Objective: Vitals:   08/15/23 1317 08/15/23 1400 08/15/23 1427 08/15/23 1428  BP:  133/87 (!) 126/107 118/86  Pulse: 93 (!) 105 97 97  Resp: 13 13 16    Temp:   (!) 97.5 F (36.4 C)   TempSrc:   Oral   SpO2: 97% 95% 100% 99%  Weight:      Height:        Intake/Output Summary (Last 24 hours) at 08/15/2023 1800 Last data filed at 08/15/2023 1701 Gross per 24 hour  Intake 583.07 ml  Output  675 ml  Net -91.93 ml   Weight change:  Exam:  General:  Pt is alert, follows commands appropriately, not in acute distress HEENT: No icterus, No thrush, No neck mass, [place]/AT Cardiovascular: RRR, S1/S2, no rubs, no  gallops Respiratory: bibasilar rales. No wheeze Abdomen: Soft/+BS, non tender, non distended, no guarding Extremities: No edema, No lymphangitis, No petechiae, No rashes, no synovitis   Data Reviewed: I have personally reviewed following labs and imaging studies Basic Metabolic Panel: Recent Labs  Lab 08/10/23 0805 08/11/23 0426 08/12/23 0503 08/13/23 0435 08/14/23 0451 08/15/23 0445  NA  --  131* 130* 130* 130* 130*  K  --  3.5 3.1* 2.8* 3.3* 3.7  CL  --  96* 93* 93* 96* 99  CO2  --  25 27 29 27 26   GLUCOSE  --  209* 172* 146* 131* 143*  BUN  --  13 10 10 9 10   CREATININE  --  0.64 0.50* 0.52* 0.58* 0.54*  CALCIUM   --  7.9* 7.7* 7.8* 8.0* 7.8*  MG 1.7  --   --   --  1.2* 1.8   Liver Function Tests: Recent Labs  Lab 08/09/23 2152 08/10/23 0604  AST 59* 57*  ALT 56* 50*  ALKPHOS 113 94  BILITOT 1.4* 1.3*  PROT 6.6 5.9*  ALBUMIN  2.6* 2.3*   No results for input(s): LIPASE, AMYLASE in the last 168 hours. Recent Labs  Lab 08/09/23 2152  AMMONIA 24   Coagulation Profile: No results for input(s): INR, PROTIME in the last 168 hours. CBC: Recent Labs  Lab 08/09/23 0336 08/10/23 0604 08/11/23 0426 08/12/23 0503 08/13/23 0435  WBC 10.3 4.8 4.1 4.0 4.3  HGB 10.8* 10.1* 10.2* 10.4* 10.0*  HCT 30.4* 28.4* 30.7* 31.8* 30.3*  MCV 87.9 91.0 93.3 93.5 92.9  PLT 300 260 256 235 250   Cardiac Enzymes: No results for input(s): CKTOTAL, CKMB, CKMBINDEX, TROPONINI in the last 168 hours. BNP: Invalid input(s): POCBNP CBG: Recent Labs  Lab 08/14/23 1612 08/14/23 2139 08/15/23 0722 08/15/23 1205 08/15/23 1631  GLUCAP 119* 216* 114* 144* 115*   HbA1C: No results for input(s): HGBA1C in the last 72 hours. Urine analysis:    Component Value Date/Time   COLORURINE YELLOW 08/08/2023 1219   APPEARANCEUR CLEAR 08/08/2023 1219   LABSPEC 1.026 08/08/2023 1219   PHURINE 6.0 08/08/2023 1219   GLUCOSEU >=500 (A) 08/08/2023 1219   HGBUR NEGATIVE  08/08/2023 1219   BILIRUBINUR NEGATIVE 08/08/2023 1219   KETONESUR 5 (A) 08/08/2023 1219   PROTEINUR NEGATIVE 08/08/2023 1219   NITRITE NEGATIVE 08/08/2023 1219   LEUKOCYTESUR NEGATIVE 08/08/2023 1219   Sepsis Labs: @LABRCNTIP (procalcitonin:4,lacticidven:4) ) Recent Results (from the past 240 hours)  MRSA Next Gen by PCR, Nasal     Status: None   Collection Time: 08/09/23  9:00 AM   Specimen: Nasal Mucosa; Nasal Swab  Result Value Ref Range Status   MRSA by PCR Next Gen NOT DETECTED NOT DETECTED Final    Comment: (NOTE) The GeneXpert MRSA Assay (FDA approved for NASAL specimens only), is one component of a comprehensive MRSA colonization surveillance program. It is not intended to diagnose MRSA infection nor to guide or monitor treatment for MRSA infections. Test performance is not FDA approved in patients less than 22 years old. Performed at [facility], [address] [place], [place] [address]      Scheduled Meds:  Chlorhexidine  Gluconate Cloth  6 each Topical Daily   diltiazem   120 mg Oral Daily  DULoxetine   60 mg Oral Daily   febuxostat   40 mg Oral Daily   folic acid   1 mg Oral Daily   heparin   5,000 Units Subcutaneous Q8H   insulin  aspart  0-6 Units Subcutaneous TID WC   insulin  starter kit- pen needles  1 kit Other Once   linagliptin   5 mg Oral Daily   methylPREDNISolone  (SOLU-MEDROL ) injection  40 mg Intravenous Q24H   Followed by   NOREEN ON 08/17/2023] predniSONE   30 mg Oral Q breakfast   Followed by   NOREEN ON 08/24/2023] predniSONE   20 mg Oral Q breakfast   Followed by   NOREEN ON 08/31/2023] predniSONE   10 mg Oral Q breakfast   multivitamin with minerals  1 tablet Oral Daily   nicotine   21 mg Transdermal Daily   nortriptyline   50 mg Oral QHS   Oxcarbazepine   300 mg Oral Daily   OXcarbazepine   600 mg Oral QHS   [START ON 08/16/2023] pantoprazole   40 mg Oral Daily   sodium chloride  flush  3 mL Intravenous Q12H   thiamine  (VITAMIN B1) injection  100 mg  Intravenous Daily   Continuous Infusions:  Procedures/Studies: ECHOCARDIOGRAM COMPLETE Result Date: 08/14/2023    ECHOCARDIOGRAM REPORT   Patient Name:   Connor Copeland Date of Exam: 08/14/2023 Medical Rec #:  [id number]              Height:       74.0 in Accession #:    [id number]             Weight:       242.9 lb Date of Birth:  04-17-1976              BSA:          2.361 m Patient Age:    47 years               BP:           117/71 mmHg Patient Gender: M                      HR:           100 bpm. Exam Location:  [facility] Procedure: 2D Echo, Cardiac Doppler and Color Doppler Indications:    SVT (supraventricular tachycardia) (HCC)  History:        Patient has prior history of Echocardiogram examinations, most                 recent 01/22/2023. ETOH abuse; Risk Factors:Current Smoker and                 Dyslipidemia.  Sonographer:    Tillman Nora RVT RCS Referring Phys: [phone] Dia Donate  Sonographer Comments: Technically difficult study due to poor echo windows, Technically challenging study due to limited acoustic windows, suboptimal parasternal window, suboptimal apical window, suboptimal subcostal window and patient is obese. Image acquisition challenging due to uncooperative patient. IMPRESSIONS  1. Left ventricular ejection fraction, by estimation, is 60 to 65%. The left ventricle has normal function. The left ventricle has no regional wall motion abnormalities. There is mild left ventricular hypertrophy. Left ventricular diastolic parameters are consistent with Grade I diastolic dysfunction (impaired relaxation).  2. Right ventricular systolic function is normal. The right ventricular size is normal.  3. The mitral valve is normal in structure. No evidence of mitral valve regurgitation. No evidence of mitral stenosis.  4. The aortic valve is tricuspid. Aortic valve  regurgitation is not visualized. No aortic stenosis is present. FINDINGS  Left Ventricle: Left ventricular ejection fraction, by  estimation, is 60 to 65%. The left ventricle has normal function. The left ventricle has no regional wall motion abnormalities. The left ventricular internal cavity size was normal in size. There is  mild left ventricular hypertrophy. Left ventricular diastolic parameters are consistent with Grade I diastolic dysfunction (impaired relaxation). Normal left ventricular filling pressure. Right Ventricle: The right ventricular size is normal. Right vetricular wall thickness was not well visualized. Right ventricular systolic function is normal. Left Atrium: Left atrial size was normal in size. Right Atrium: Right atrial size was normal in size. Pericardium: There is no evidence of pericardial effusion. Mitral Valve: The mitral valve is normal in structure. No evidence of mitral valve regurgitation. No evidence of mitral valve stenosis. Tricuspid Valve: The tricuspid valve is normal in structure. Tricuspid valve regurgitation is not demonstrated. No evidence of tricuspid stenosis. Aortic Valve: The aortic valve is tricuspid. Aortic valve regurgitation is not visualized. No aortic stenosis is present. Aortic valve mean gradient measures 2.0 mmHg. Aortic valve peak gradient measures 4.4 mmHg. Aortic valve area, by VTI measures 3.08 cm. Pulmonic Valve: The pulmonic valve was not well visualized. Pulmonic valve regurgitation is not visualized. No evidence of pulmonic stenosis. Aorta: The aortic root is normal in size and structure. IAS/Shunts: The interatrial septum was not well visualized.  LEFT VENTRICLE PLAX 2D LVIDd:         4.30 cm   Diastology LVIDs:         3.00 cm   LV e' medial:    6.53 cm/s LV PW:         1.20 cm   LV E/e' medial:  8.7 LV IVS:        1.30 cm   LV e' lateral:   7.18 cm/s LVOT diam:     2.10 cm   LV E/e' lateral: 7.9 LV SV:         48 LV SV Index:   20 LVOT Area:     3.46 cm  RIGHT VENTRICLE RV S prime:     14.50 cm/s LEFT ATRIUM             Index LA diam:        3.20 cm 1.36 cm/m LA Vol (A2C):    41.7 ml 17.66 ml/m LA Vol (A4C):   46.3 ml 19.61 ml/m LA Biplane Vol: 44.7 ml 18.93 ml/m  AORTIC VALVE                    PULMONIC VALVE AV Area (Vmax):    3.05 cm     PV Vmax:       0.92 m/s AV Area (Vmean):   3.03 cm     PV Peak grad:  3.4 mmHg AV Area (VTI):     3.08 cm AV Vmax:           105.00 cm/s AV Vmean:          70.600 cm/s AV VTI:            0.155 m AV Peak Grad:      4.4 mmHg AV Mean Grad:      2.0 mmHg LVOT Vmax:         92.40 cm/s LVOT Vmean:        61.800 cm/s LVOT VTI:          0.138 m LVOT/AV VTI ratio: 0.89  AORTA Ao Root diam: 3.80 cm Ao Asc diam:  3.50 cm MITRAL VALVE MV Area (PHT): 4.60 cm    SHUNTS MV Decel Time: 165 msec    Systemic VTI:  0.14 m MV E velocity: 56.90 cm/s  Systemic Diam: 2.10 cm MV A velocity: 88.10 cm/s MV E/A ratio:  0.65 Dorn Ross MD Electronically signed by Dorn Ross MD Signature Date/Time: 08/14/2023/12:31:06 PM    Final    CT HEAD WO CONTRAST ( ) Result Date: 08/10/2023 CLINICAL DATA:  Altered mental status, nontraumatic (Ped 0-17y). EXAM: CT HEAD WITHOUT CONTRAST TECHNIQUE: Contiguous axial images were obtained from the base of the skull through the vertex without intravenous contrast. RADIATION DOSE REDUCTION: This exam was performed according to the departmental dose-optimization program which includes automated exposure control, adjustment of the mA and/or kV according to patient size and/or use of iterative reconstruction technique. COMPARISON:  CT head June 25, 24. FINDINGS: Brain: No evidence of acute infarction, hemorrhage, hydrocephalus, extra-axial collection or mass lesion/mass effect. Vascular: No hyperdense vessel or unexpected calcification. Skull: No acute fracture. Sinuses/Orbits: Clear sinuses.  No acute orbital findings. Other: No mastoid effusions. IMPRESSION: Stable head CT.  No evidence of acute intracranial abnormality. Electronically Signed   By: Gilmore GORMAN Molt M.D.   On: 08/10/2023 02:21    Alm Schneider, DO  [facility]  If 7PM-7AM, please contact night-coverage [email or web address] Password [email or web address] 08/15/2023, 6:00 PM   LOS: 7 days

## 2023-08-15 NOTE — Plan of Care (Signed)
  Problem: Education: Goal: Knowledge of General Education information will improve Description: Including pain rating scale, medication(s)/side effects and non-pharmacologic comfort measures Outcome: Progressing   Problem: Health Behavior/Discharge Planning: Goal: Ability to manage health-related needs will improve Outcome: Progressing   Problem: Clinical Measurements: Goal: Ability to maintain clinical measurements within normal limits will improve Outcome: Progressing Goal: Will remain free from infection Outcome: Progressing Goal: Diagnostic test results will improve Outcome: Progressing Goal: Respiratory complications will improve Outcome: Progressing Goal: Cardiovascular complication will be avoided Outcome: Progressing   Problem: Activity: Goal: Risk for activity intolerance will decrease Outcome: Progressing   Problem: Nutrition: Goal: Adequate nutrition will be maintained Outcome: Progressing   Problem: Coping: Goal: Level of anxiety will decrease Outcome: Progressing   Problem: Elimination: Goal: Will not experience complications related to bowel motility Outcome: Progressing Goal: Will not experience complications related to urinary retention Outcome: Progressing   Problem: Pain Management: Goal: General experience of comfort will improve Outcome: Progressing   Problem: Safety: Goal: Ability to remain free from injury will improve Outcome: Progressing   Problem: Skin Integrity: Goal: Risk for impaired skin integrity will decrease Outcome: Progressing   Problem: Education: Goal: Ability to describe self-care measures that may prevent or decrease complications (Diabetes Survival Skills Education) will improve Outcome: Progressing Goal: Individualized Educational Video(s) Outcome: Progressing

## 2023-08-16 DIAGNOSIS — I471 Supraventricular tachycardia, unspecified: Secondary | ICD-10-CM | POA: Diagnosis not present

## 2023-08-16 DIAGNOSIS — G6181 Chronic inflammatory demyelinating polyneuritis: Secondary | ICD-10-CM | POA: Diagnosis not present

## 2023-08-16 DIAGNOSIS — F10932 Alcohol use, unspecified with withdrawal with perceptual disturbance: Secondary | ICD-10-CM | POA: Diagnosis not present

## 2023-08-16 LAB — BASIC METABOLIC PANEL
Anion gap: 6 (ref 5–15)
BUN: 11 mg/dL (ref 6–20)
CO2: 25 mmol/L (ref 22–32)
Calcium: 8 mg/dL — ABNORMAL LOW (ref 8.9–10.3)
Chloride: 102 mmol/L (ref 98–111)
Creatinine, Ser: 0.57 mg/dL — ABNORMAL LOW (ref 0.61–1.24)
GFR, Estimated: >60 mL/min (ref >60)
Glucose, Bld: 131 mg/dL — ABNORMAL HIGH (ref 70–99)
Potassium: 4 mmol/L (ref 3.5–5.1)
Sodium: 133 mmol/L — ABNORMAL LOW (ref 135–145)

## 2023-08-16 LAB — GLUCOSE, CAPILLARY
Glucose-Capillary: 89 mg/dL (ref 70–99)
Glucose-Capillary: 123 mg/dL — ABNORMAL HIGH (ref 70–99)
Glucose-Capillary: 204 mg/dL — ABNORMAL HIGH (ref 70–99)

## 2023-08-16 LAB — MAGNESIUM
Magnesium: 1.7 mg/dL (ref 1.7–2.4)
Magnesium: 1.6 mg/dL — ABNORMAL LOW (ref 1.7–2.4)

## 2023-08-16 MED ORDER — MAGNESIUM OXIDE -MG SUPPLEMENT 400 (240 MG) MG PO TABS
400.0000 mg | ORAL_TABLET | Freq: Two times a day (BID) | ORAL | Status: DC
  Administered 2023-08-16 – 2023-08-19 (×6): 400 mg via ORAL
  Filled 2023-08-16 (×6): qty 1

## 2023-08-16 MED ORDER — POTASSIUM CHLORIDE CRYS ER 20 MEQ PO TBCR
20.0000 meq | EXTENDED_RELEASE_TABLET | Freq: Every day | ORAL | Status: DC
  Administered 2023-08-16 – 2023-08-19 (×4): 20 meq via ORAL
  Filled 2023-08-16 (×4): qty 1

## 2023-08-16 NOTE — Plan of Care (Signed)
   Problem: Education: Goal: Knowledge of General Education information will improve Description Including pain rating scale, medication(s)/side effects and non-pharmacologic comfort measures Outcome: Progressing

## 2023-08-16 NOTE — Progress Notes (Signed)
 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   ED evaluation: Blood pressure (!) 159/147, pulse (!) 109, temperature 98.9 F (37.2 C), temperature source Oral, resp. rate 18, SpO2 94%.  LABs; sodium 125, potassium 2.6, chloride 78, serum glucose 559, anion gap 21, WBC 10.2, UA unremarkable with exception of greater than 500 glucose Urine drug screen positive for opioids, marijuana  Neurology was consulted.  They recommended 5-day course of IVIG.  The patient was continued on his home antiepileptic medications and steroids.  Overall, he did show some improvement with his weakness, but continued to have deconditioning.  PT and OT were consulted and recommended CIR.  His hospitalization was also prolonged secondary to development of atrial tachycardia and SVT.       Assessment/Plan: CIDP exacerbation -Patient finished 5 days of IVIG--last dose 08/12/2023 -Resume prednisone  taper 40mg  currently, taper by 10mg  every week to stop  -Outpatient follow-up with Dr. Vear -Appreciate neurology consult   Alcohol  abuse and withdrawal -Patient required Precedex  and phenobarbital  -Finished phenobarbital  taper -Cessation discussed -Continue thiamine  and multivitamin -now improved   SVT/atrial tachycardia -Start short acting diltiazem >>long acting on 1/10 -TSH 0.9111 -1/9 Echo EF 60-65%, no WMA, G1DD, normal RVF -appreciate cardiology   Gait instability/deconditioning -PT/OT evaluation -Planning for CIR if insurance approves   Diabetes mellitus type  2 -No previous history of diabetes -08/08/2023 hemoglobin A1c 8.9 -Continue NovoLog  sliding scale -In part due to the patient's steroids -add tradjenta    Hyponatremia -Secondary to hyperglycemia and chronic steroid use -Overall stable   Peripheral neuropathy - Home medications Cymbalta , as needed oxycodone , Ultram , -Per neurology recommendation continue oxcarbazepine  And Lamictal    Gouty arthritis -Continue Uloric    Hypokalemia/Hypomagnesemia -Repleting -Check magnesium  1.2>>1.8 -start daily po mag   Tobacco abuse -Tobacco cessation discussed -NicoDerm patch   Low folic acid  -Repleting   Peripheral neuropathy/neuropathic pain - Home medications Cymbalta , as needed hydrocodone  -Per neurology recommendation continue oxcarbazepine  And Lamictal    Lactic acidosis -sepsis ruled out -improved with IVF   Obesity -BMI 31.19 -lifestyle modification                   Family Communication:   mother updated  1/10   Consultants:  neurology   Code Status:  FULL   DVT Prophylaxis:  Numidia Heparin      Procedures: As Listed in Progress Note Above   Antibiotics: None       Subjective: Patient denies fevers, chills, headache, chest pain, dyspnea, nausea, vomiting, diarrhea, abdominal pain, dysuria, hematuria, hematochezia, and melena.   Objective: Vitals:   08/15/23 2106 08/16/23 0500 08/16/23 0515 08/16/23 1427  BP: 112/79  (!) 128/111 (!) 123/94  Pulse: 98  89 100  Resp: 20   18  Temp: 98.7 F (37.1 C)  98.1 F (36.7 C) 98 F (36.7 C)  TempSrc: Oral  Oral Oral  SpO2: 95%  97% 94%  Weight:  107.5 kg    Height:        Intake/Output Summary (Last 24 hours) at 08/16/2023 1621 Last data filed at 08/16/2023 1029 Gross per 24 hour  Intake 290  ml  Output --  Net 290 ml   Weight change: 3.3 kg Exam:  General:  Pt is alert, follows commands appropriately, not in acute distress HEENT: No icterus, No thrush, No neck mass, [place]/AT Cardiovascular: RRR,  S1/S2, no rubs, no gallops Respiratory: CTA bilaterally, no wheezing, no crackles, no rhonchi Abdomen: Soft/+BS, non tender, non distended, no guarding Extremities: No edema, No lymphangitis, No petechiae, No rashes, no synovitis   Data Reviewed: I have personally reviewed following labs and imaging studies Basic Metabolic Panel: Recent Labs  Lab 08/10/23 0805 08/11/23 0426 08/12/23 0503 08/13/23 0435 08/14/23 0451 08/15/23 0445 08/16/23 0411  NA  --    < > 130* 130* 130* 130* 133*  K  --    < > 3.1* 2.8* 3.3* 3.7 4.0  CL  --    < > 93* 93* 96* 99 102  CO2  --    < > 27 29 27 26 25   GLUCOSE  --    < > 172* 146* 131* 143* 131*  BUN  --    < > 10 10 9 10 11   CREATININE  --    < > 0.50* 0.52* 0.58* 0.54* 0.57*  CALCIUM   --    < > 7.7* 7.8* 8.0* 7.8* 8.0*  MG 1.7  --   --   --  1.2* 1.8 1.7   < > = values in this interval not displayed.   Liver Function Tests: Recent Labs  Lab 08/09/23 2152 08/10/23 0604  AST 59* 57*  ALT 56* 50*  ALKPHOS 113 94  BILITOT 1.4* 1.3*  PROT 6.6 5.9*  ALBUMIN  2.6* 2.3*   No results for input(s): LIPASE, AMYLASE in the last 168 hours. Recent Labs  Lab 08/09/23 2152  AMMONIA 24   Coagulation Profile: No results for input(s): INR, PROTIME in the last 168 hours. CBC: Recent Labs  Lab 08/10/23 0604 08/11/23 0426 08/12/23 0503 08/13/23 0435  WBC 4.8 4.1 4.0 4.3  HGB 10.1* 10.2* 10.4* 10.0*  HCT 28.4* 30.7* 31.8* 30.3*  MCV 91.0 93.3 93.5 92.9  PLT 260 256 235 250   Cardiac Enzymes: No results for input(s): CKTOTAL, CKMB, CKMBINDEX, TROPONINI in the last 168 hours. BNP: Invalid input(s): POCBNP CBG: Recent Labs  Lab 08/15/23 1205 08/15/23 1631 08/15/23 2107 08/16/23 0749 08/16/23 1613  GLUCAP 144* 115* 231* 89 123*   HbA1C: No results for input(s): HGBA1C in the last 72 hours. Urine analysis:    Component Value Date/Time   COLORURINE YELLOW 08/08/2023 1219   APPEARANCEUR CLEAR 08/08/2023 1219    LABSPEC 1.026 08/08/2023 1219   PHURINE 6.0 08/08/2023 1219   GLUCOSEU >=500 (A) 08/08/2023 1219   HGBUR NEGATIVE 08/08/2023 1219   BILIRUBINUR NEGATIVE 08/08/2023 1219   KETONESUR 5 (A) 08/08/2023 1219   PROTEINUR NEGATIVE 08/08/2023 1219   NITRITE NEGATIVE 08/08/2023 1219   LEUKOCYTESUR NEGATIVE 08/08/2023 1219   Sepsis Labs: @LABRCNTIP (procalcitonin:4,lacticidven:4) ) Recent Results (from the past 240 hours)  MRSA Next Gen by PCR, Nasal     Status: None   Collection Time: 08/09/23  9:00 AM   Specimen: Nasal Mucosa; Nasal Swab  Result Value Ref Range Status   MRSA by PCR Next Gen NOT DETECTED NOT DETECTED Final    Comment: (NOTE) The GeneXpert MRSA Assay (FDA approved for NASAL specimens only), is one component of a comprehensive MRSA colonization surveillance program. It is not intended to diagnose MRSA infection nor to guide or monitor treatment for MRSA infections. Test performance is not  FDA approved in patients less than 65 years old. Performed at [facility], [address], [place], [place] [address]      Scheduled Meds:  Chlorhexidine  Gluconate Cloth  6 each Topical Daily   diltiazem   120 mg Oral Daily   DULoxetine   60 mg Oral Daily   febuxostat   40 mg Oral Daily   folic acid   1 mg Oral Daily   heparin   5,000 Units Subcutaneous Q8H   insulin  aspart  0-6 Units Subcutaneous TID WC   insulin  starter kit- pen needles  1 kit Other Once   linagliptin   5 mg Oral Daily   magnesium  oxide  400 mg Oral BID   methylPREDNISolone  (SOLU-MEDROL ) injection  40 mg Intravenous Q24H   Followed by   NOREEN ON 08/17/2023] predniSONE   30 mg Oral Q breakfast   Followed by   NOREEN ON 08/24/2023] predniSONE   20 mg Oral Q breakfast   Followed by   NOREEN ON 08/31/2023] predniSONE   10 mg Oral Q breakfast   multivitamin with minerals  1 tablet Oral Daily   nicotine   21 mg Transdermal Daily   nortriptyline   50 mg Oral QHS   Oxcarbazepine   300 mg Oral Daily   OXcarbazepine   600 mg Oral  QHS   pantoprazole   40 mg Oral Daily   potassium chloride   20 mEq Oral Daily   sodium chloride  flush  3 mL Intravenous Q12H   thiamine  (VITAMIN B1) injection  100 mg Intravenous Daily   Continuous Infusions:  Procedures/Studies: ECHOCARDIOGRAM COMPLETE Result Date: 08/14/2023    ECHOCARDIOGRAM REPORT   Patient Name:   BEE HAMMERSCHMIDT Date of Exam: 08/14/2023 Medical Rec #:  [id number]              Height:       74.0 in Accession #:    [id number]             Weight:       242.9 lb Date of Birth:  [birth year]              BSA:          2.361 m Patient Age:    47 years               BP:           117/71 mmHg Patient Gender: M                      HR:           100 bpm. Exam Location:  [facility] Procedure: 2D Echo, Cardiac Doppler and Color Doppler Indications:    SVT (supraventricular tachycardia) (HCC)  History:        Patient has prior history of Echocardiogram examinations, most                 recent 01/22/2023. ETOH abuse; Risk Factors:Current Smoker and                 Dyslipidemia.  Sonographer:    Tillman Nora RVT RCS Referring Phys: [phone] Shylo Dillenbeck  Sonographer Comments: Technically difficult study due to poor echo windows, Technically challenging study due to limited acoustic windows, suboptimal parasternal window, suboptimal apical window, suboptimal subcostal window and patient is obese. Image acquisition challenging due to uncooperative patient. IMPRESSIONS  1. Left ventricular ejection fraction, by estimation, is 60 to 65%. The left ventricle has normal function. The left ventricle has no regional wall motion abnormalities. There  is mild left ventricular hypertrophy. Left ventricular diastolic parameters are consistent with Grade I diastolic dysfunction (impaired relaxation).  2. Right ventricular systolic function is normal. The right ventricular size is normal.  3. The mitral valve is normal in structure. No evidence of mitral valve regurgitation. No evidence of mitral stenosis.  4. The aortic  valve is tricuspid. Aortic valve regurgitation is not visualized. No aortic stenosis is present. FINDINGS  Left Ventricle: Left ventricular ejection fraction, by estimation, is 60 to 65%. The left ventricle has normal function. The left ventricle has no regional wall motion abnormalities. The left ventricular internal cavity size was normal in size. There is  mild left ventricular hypertrophy. Left ventricular diastolic parameters are consistent with Grade I diastolic dysfunction (impaired relaxation). Normal left ventricular filling pressure. Right Ventricle: The right ventricular size is normal. Right vetricular wall thickness was not well visualized. Right ventricular systolic function is normal. Left Atrium: Left atrial size was normal in size. Right Atrium: Right atrial size was normal in size. Pericardium: There is no evidence of pericardial effusion. Mitral Valve: The mitral valve is normal in structure. No evidence of mitral valve regurgitation. No evidence of mitral valve stenosis. Tricuspid Valve: The tricuspid valve is normal in structure. Tricuspid valve regurgitation is not demonstrated. No evidence of tricuspid stenosis. Aortic Valve: The aortic valve is tricuspid. Aortic valve regurgitation is not visualized. No aortic stenosis is present. Aortic valve mean gradient measures 2.0 mmHg. Aortic valve peak gradient measures 4.4 mmHg. Aortic valve area, by VTI measures 3.08 cm. Pulmonic Valve: The pulmonic valve was not well visualized. Pulmonic valve regurgitation is not visualized. No evidence of pulmonic stenosis. Aorta: The aortic root is normal in size and structure. IAS/Shunts: The interatrial septum was not well visualized.  LEFT VENTRICLE PLAX 2D LVIDd:         4.30 cm   Diastology LVIDs:         3.00 cm   LV e' medial:    6.53 cm/s LV PW:         1.20 cm   LV E/e' medial:  8.7 LV IVS:        1.30 cm   LV e' lateral:   7.18 cm/s LVOT diam:     2.10 cm   LV E/e' lateral: 7.9 LV SV:         48 LV SV  Index:   20 LVOT Area:     3.46 cm  RIGHT VENTRICLE RV S prime:     14.50 cm/s LEFT ATRIUM             Index LA diam:        3.20 cm 1.36 cm/m LA Vol (A2C):   41.7 ml 17.66 ml/m LA Vol (A4C):   46.3 ml 19.61 ml/m LA Biplane Vol: 44.7 ml 18.93 ml/m  AORTIC VALVE                    PULMONIC VALVE AV Area (Vmax):    3.05 cm     PV Vmax:       0.92 m/s AV Area (Vmean):   3.03 cm     PV Peak grad:  3.4 mmHg AV Area (VTI):     3.08 cm AV Vmax:           105.00 cm/s AV Vmean:          70.600 cm/s AV VTI:            0.155 m  AV Peak Grad:      4.4 mmHg AV Mean Grad:      2.0 mmHg LVOT Vmax:         92.40 cm/s LVOT Vmean:        61.800 cm/s LVOT VTI:          0.138 m LVOT/AV VTI ratio: 0.89  AORTA Ao Root diam: 3.80 cm Ao Asc diam:  3.50 cm MITRAL VALVE MV Area (PHT): 4.60 cm    SHUNTS MV Decel Time: 165 msec    Systemic VTI:  0.14 m MV E velocity: 56.90 cm/s  Systemic Diam: 2.10 cm MV A velocity: 88.10 cm/s MV E/A ratio:  0.65 Dorn Ross MD Electronically signed by Dorn Ross MD Signature Date/Time: 08/14/2023/12:31:06 PM    Final    CT HEAD WO CONTRAST ( ) Result Date: 08/10/2023 CLINICAL DATA:  Altered mental status, nontraumatic (Ped 0-17y). EXAM: CT HEAD WITHOUT CONTRAST TECHNIQUE: Contiguous axial images were obtained from the base of the skull through the vertex without intravenous contrast. RADIATION DOSE REDUCTION: This exam was performed according to the departmental dose-optimization program which includes automated exposure control, adjustment of the mA and/or kV according to patient size and/or use of iterative reconstruction technique. COMPARISON:  CT head June 25, 24. FINDINGS: Brain: No evidence of acute infarction, hemorrhage, hydrocephalus, extra-axial collection or mass lesion/mass effect. Vascular: No hyperdense vessel or unexpected calcification. Skull: No acute fracture. Sinuses/Orbits: Clear sinuses.  No acute orbital findings. Other: No mastoid effusions. IMPRESSION: Stable head  CT.  No evidence of acute intracranial abnormality. Electronically Signed   By: Gilmore GORMAN Molt M.D.   On: 08/10/2023 02:21    Alm Schneider, DO  [facility]  If 7PM-7AM, please contact night-coverage [email or web address] Password [email or web address] 08/16/2023, 4:21 PM   LOS: 8 days

## 2023-08-16 NOTE — Plan of Care (Signed)
 Problem: Education: Goal: Knowledge of General Education information will improve Description: Including pain rating scale, medication(s)/side effects and non-pharmacologic comfort measures 08/16/2023 1807 by Claudette Suzen PARAS, RN Outcome: Progressing 08/16/2023 1803 by Claudette Suzen PARAS, RN Outcome: Progressing   Problem: Health Behavior/Discharge Planning: Goal: Ability to manage health-related needs will improve 08/16/2023 1807 by Claudette Suzen PARAS, RN Outcome: Progressing 08/16/2023 1803 by Claudette Suzen PARAS, RN Outcome: Progressing   Problem: Clinical Measurements: Goal: Ability to maintain clinical measurements within normal limits will improve 08/16/2023 1807 by Claudette Suzen PARAS, RN Outcome: Progressing 08/16/2023 1803 by Claudette Suzen PARAS, RN Outcome: Progressing Goal: Will remain free from infection 08/16/2023 1807 by Claudette Suzen PARAS, RN Outcome: Progressing 08/16/2023 1803 by Claudette Suzen PARAS, RN Outcome: Progressing Goal: Diagnostic test results will improve 08/16/2023 1807 by Claudette Suzen PARAS, RN Outcome: Progressing 08/16/2023 1803 by Claudette Suzen PARAS, RN Outcome: Progressing Goal: Respiratory complications will improve 08/16/2023 1807 by Claudette Suzen PARAS, RN Outcome: Progressing 08/16/2023 1803 by Claudette Suzen PARAS, RN Outcome: Progressing Goal: Cardiovascular complication will be avoided 08/16/2023 1807 by Claudette Suzen PARAS, RN Outcome: Progressing 08/16/2023 1803 by Claudette Suzen PARAS, RN Outcome: Progressing   Problem: Activity: Goal: Risk for activity intolerance will decrease 08/16/2023 1807 by Claudette Suzen PARAS, RN Outcome: Progressing 08/16/2023 1803 by Claudette Suzen PARAS, RN Outcome: Progressing   Problem: Nutrition: Goal: Adequate nutrition will be maintained 08/16/2023 1807 by Claudette Suzen PARAS, RN Outcome: Progressing 08/16/2023 1803 by Claudette Suzen PARAS, RN Outcome: Progressing   Problem:  Coping: Goal: Level of anxiety will decrease 08/16/2023 1807 by Claudette Suzen PARAS, RN Outcome: Progressing 08/16/2023 1803 by Claudette Suzen PARAS, RN Outcome: Progressing   Problem: Elimination: Goal: Will not experience complications related to bowel motility 08/16/2023 1807 by Claudette Suzen PARAS, RN Outcome: Progressing 08/16/2023 1803 by Claudette Suzen PARAS, RN Outcome: Progressing Goal: Will not experience complications related to urinary retention 08/16/2023 1807 by Claudette Suzen PARAS, RN Outcome: Progressing 08/16/2023 1803 by Claudette Suzen PARAS, RN Outcome: Progressing   Problem: Pain Management: Goal: General experience of comfort will improve 08/16/2023 1807 by Claudette Suzen PARAS, RN Outcome: Progressing 08/16/2023 1803 by Claudette Suzen PARAS, RN Outcome: Progressing   Problem: Safety: Goal: Ability to remain free from injury will improve 08/16/2023 1807 by Claudette Suzen PARAS, RN Outcome: Progressing 08/16/2023 1803 by Claudette Suzen PARAS, RN Outcome: Progressing   Problem: Skin Integrity: Goal: Risk for impaired skin integrity will decrease 08/16/2023 1807 by Claudette Suzen PARAS, RN Outcome: Progressing 08/16/2023 1803 by Claudette Suzen PARAS, RN Outcome: Progressing   Problem: Education: Goal: Ability to describe self-care measures that may prevent or decrease complications (Diabetes Survival Skills Education) will improve 08/16/2023 1807 by Claudette Suzen PARAS, RN Outcome: Progressing 08/16/2023 1803 by Claudette Suzen PARAS, RN Outcome: Progressing Goal: Individualized Educational Video(s) 08/16/2023 1807 by Claudette Suzen PARAS, RN Outcome: Progressing 08/16/2023 1803 by Claudette Suzen PARAS, RN Outcome: Progressing   Problem: Coping: Goal: Ability to adjust to condition or change in health will improve 08/16/2023 1807 by Claudette Suzen PARAS, RN Outcome: Progressing 08/16/2023 1803 by Claudette Suzen PARAS, RN Outcome: Progressing   Problem:  Fluid Volume: Goal: Ability to maintain a balanced intake and output will improve 08/16/2023 1807 by Claudette Suzen PARAS, RN Outcome: Progressing 08/16/2023 1803 by Claudette Suzen PARAS, RN Outcome: Progressing   Problem: Health Behavior/Discharge Planning: Goal: Ability to identify and utilize available resources and services will improve 08/16/2023 1807 by Claudette Suzen PARAS, RN Outcome: Progressing 08/16/2023 1803 by Claudette Suzen PARAS,  RN Outcome: Progressing Goal: Ability to manage health-related needs will improve 08/16/2023 1807 by Claudette Suzen PARAS, RN Outcome: Progressing 08/16/2023 1803 by Claudette Suzen PARAS, RN Outcome: Progressing   Problem: Metabolic: Goal: Ability to maintain appropriate glucose levels will improve 08/16/2023 1807 by Claudette Suzen PARAS, RN Outcome: Progressing 08/16/2023 1803 by Claudette Suzen PARAS, RN Outcome: Progressing   Problem: Nutritional: Goal: Maintenance of adequate nutrition will improve 08/16/2023 1807 by Claudette Suzen PARAS, RN Outcome: Progressing 08/16/2023 1803 by Claudette Suzen PARAS, RN Outcome: Progressing Goal: Progress toward achieving an optimal weight will improve 08/16/2023 1807 by Claudette Suzen PARAS, RN Outcome: Progressing 08/16/2023 1803 by Claudette Suzen PARAS, RN Outcome: Progressing   Problem: Skin Integrity: Goal: Risk for impaired skin integrity will decrease 08/16/2023 1807 by Claudette Suzen PARAS, RN Outcome: Progressing 08/16/2023 1803 by Claudette Suzen PARAS, RN Outcome: Progressing   Problem: Tissue Perfusion: Goal: Adequacy of tissue perfusion will improve 08/16/2023 1807 by Claudette Suzen PARAS, RN Outcome: Progressing 08/16/2023 1803 by Claudette Suzen PARAS, RN Outcome: Progressing   Problem: Safety: Goal: Non-violent Restraint(s) 08/16/2023 1807 by Claudette Suzen PARAS, RN Outcome: Progressing 08/16/2023 1803 by Claudette Suzen PARAS, RN Outcome: Progressing

## 2023-08-17 DIAGNOSIS — G6181 Chronic inflammatory demyelinating polyneuritis: Secondary | ICD-10-CM | POA: Diagnosis not present

## 2023-08-17 DIAGNOSIS — I471 Supraventricular tachycardia, unspecified: Secondary | ICD-10-CM | POA: Diagnosis not present

## 2023-08-17 DIAGNOSIS — F1010 Alcohol abuse, uncomplicated: Secondary | ICD-10-CM | POA: Diagnosis not present

## 2023-08-17 LAB — BASIC METABOLIC PANEL
Anion gap: 5 (ref 5–15)
BUN: 11 mg/dL (ref 6–20)
CO2: 26 mmol/L (ref 22–32)
Calcium: 8.1 mg/dL — ABNORMAL LOW (ref 8.9–10.3)
Chloride: 100 mmol/L (ref 98–111)
Creatinine, Ser: 0.59 mg/dL — ABNORMAL LOW (ref 0.61–1.24)
GFR, Estimated: >60 mL/min (ref >60)
Glucose, Bld: 104 mg/dL — ABNORMAL HIGH (ref 70–99)
Potassium: 3.8 mmol/L (ref 3.5–5.1)
Sodium: 131 mmol/L — ABNORMAL LOW (ref 135–145)

## 2023-08-17 LAB — GLUCOSE, CAPILLARY
Glucose-Capillary: 93 mg/dL (ref 70–99)
Glucose-Capillary: 164 mg/dL — ABNORMAL HIGH (ref 70–99)
Glucose-Capillary: 196 mg/dL — ABNORMAL HIGH (ref 70–99)

## 2023-08-17 MED ORDER — FOLIC ACID 1 MG PO TABS: 1.0000 mg | ORAL_TABLET | Freq: Every day | ORAL | Status: DC

## 2023-08-17 MED ORDER — THIAMINE HCL 100 MG/ML IJ SOLN: 100.0000 mg | Freq: Every day | INTRAMUSCULAR | Status: DC

## 2023-08-17 NOTE — Discharge Summary (Signed)
 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   ED evaluation: Blood pressure (!) 159/147, pulse (!) 109, temperature 98.9 F (37.2 C), temperature source Oral, resp. rate 18, SpO2 94%.  LABs; sodium 125, potassium 2.6, chloride 78, serum glucose 559, anion gap 21, WBC 10.2, UA unremarkable with exception of greater than 500 glucose Urine drug screen positive for opioids, marijuana  Neurology was consulted.  They recommended 5-day course of IVIG.  The patient was continued on his home antiepileptic medications and steroids.  Overall, he did show some improvement with his weakness, but continued to have deconditioning.  PT and OT were consulted and recommended CIR.  His hospitalization was also prolonged secondary to development of atrial tachycardia and SVT. Echocardiogram was reassuring.  The patient was started on short acting diltiazem  initially.  His heart rate improved, and the patient was transition to oral long-acting diltiazem  without any further complications.  His potassium and magnesium  were optimized.  His functional status remained stable.  He continued to ambulate with a walker and ultimately agreed to go to CIR for intensive therapy .     Assessment and Plan:  Connor Copeland finished 5 days of  IVIG--last dose 08/12/2023 -Resume prednisone  taper 40mg  currently, taper by 10mg  every week to stop  -Prednisone  30 mg once daily started on 08/17/2023 -Outpatient follow-up with Connor Copeland -Appreciate neurology consult -PT eval >>CIR   Alcohol  abuse and withdrawal -Patient required Precedex  and phenobarbital  -Finished phenobarbital  taper -Cessation discussed -Continue thiamine  and multivitamin -now improved   SVT/atrial tachycardia -Start short acting diltiazem >>long acting on 08/15/23 -TSH 0.9111 -1/9 Echo EF 60-65%, no WMA, G1DD, normal RVF -appreciate cardiology -HR now controlled   Gait instability/deconditioning -PT/OT evaluation -Planning for CIR if insurance approves   Diabetes mellitus type 2 -No previous history of diabetes -08/08/2023 hemoglobin A1c 8.9 -Continue NovoLog  sliding scale -In part due to the patient's chronic steroids -added tradjenta  -CBGs overall relatively controlled--will not plan to DC home with insulin  at this point. -He will need continued diabetic education   Hyponatremia -Secondary to hyperglycemia and chronic steroid use -Overall stable/improving   Peripheral neuropathy - Home medications Cymbalta , as needed oxycodone , Ultram , -Per neurology recommendation continue oxcarbazepine  And Lamictal    Gouty arthritis -Continue Uloric    Hypokalemia/Hypomagnesemia -Repleting -Continue magnesium  oxide 400 mg twice daily have   Tobacco abuse -Tobacco cessation discussed -NicoDerm patch   Low folic acid  -Repleting   Peripheral neuropathy/neuropathic pain - Home medications Cymbalta , as needed hydrocodone  -Per neurology recommendation continue oxcarbazepine    Lactic acidosis -sepsis ruled out -improved with IVF   Obesity -BMI 31.19 -lifestyle modification Morning to            Consultants: neurology Procedures performed: none  Disposition: Home Diet recommendation:  Carb modified diet DISCHARGE MEDICATION: Allergies as  of 08/19/2023       Reactions   Gabapentin Swelling  Swelling off the legs        Medication List     TAKE these medications    diltiazem  120 MG 24 hr capsule Commonly known as: CARDIZEM  CD Take 1 capsule (120 mg total) by mouth daily.   DULoxetine  60 MG capsule Commonly known as: Cymbalta  Take 1 capsule (60 mg total) by mouth daily.   fluticasone 50 MCG/ACT nasal spray Commonly known as: FLONASE Place 1 spray into both nostrils daily.   folic acid  1 MG tablet Commonly known as: FOLVITE  Take 1 tablet (1 mg total) by mouth daily.   HYDROcodone -acetaminophen  7.5-325 MG tablet Commonly known as: NORCO Take 1 tablet by mouth 3 (three) times daily as needed.   linagliptin  5 MG Tabs tablet Commonly known as: TRADJENTA  Take 1 tablet (5 mg total) by mouth daily.   magnesium  oxide 400 (240 Mg) MG tablet Commonly known as: MAG-OX Take 1 tablet (400 mg total) by mouth 2 (two) times daily.   nicotine  21 mg/24hr patch Commonly known as: NICODERM CQ  - dosed in mg/24 hours Place 1 patch (21 mg total) onto the skin daily.   nortriptyline  25 MG capsule Commonly known as: PAMELOR  Take 2 capsules (50 mg total) by mouth at bedtime.   Oxcarbazepine  300 MG tablet Commonly known as: TRILEPTAL  one po qAM and two po qHS   pantoprazole  40 MG tablet Commonly known as: PROTONIX  Take 1 tablet (40 mg total) by mouth daily.   potassium chloride  SA 20 MEQ tablet Commonly known as: KLOR-CON  M Take 1 tablet (20 mEq total) by mouth daily.   predniSONE  20 MG tablet Commonly known as: DELTASONE  Take 4 pills (80 mg) p.o. daily for 6 weeks.  Then take 3 pills p.o. daily x 2 weeks.  Then take 2 pills p.o. daily x 2 weeks.  Then take 1 pill a day until the follow-up visit with your neurologist What changed: Another medication with the same name was added. Make sure you understand how and when to take each.   predniSONE  10 MG tablet Commonly known as: DELTASONE  Take 3 tablets (30 mg total)  by mouth daily with breakfast for 5 days, THEN 2 tablets (20 mg total) daily with breakfast for 7 days, THEN 1 tablet (10 mg total) daily with breakfast for 7 days. Start taking on: August 19, 2023 What changed: You were already taking a medication with the same name, and this prescription was added. Make sure you understand how and when to take each.   thiamine  100 MG/ML injection Commonly known as: VITAMIN B1 Inject 1 mL (100 mg total) into the vein daily.   traMADol  50 MG tablet Commonly known as: ULTRAM  Take 1 tablet (50 mg total) by mouth in the morning, at noon, in the evening, and at bedtime.   Uloric  80 MG Tabs Generic drug: Febuxostat  Take 40 mg by mouth daily.               Durable Medical Equipment  (From admission, onward)           Start     Ordered   08/14/23 0946  For home use only DME 3 n 1  Once        08/14/23 0948   08/12/23 1630  DME Glucometer  Once        08/12/23 1631            Follow-up Information     Connor Laymon HERO, Connor Copeland Follow up.   Specialties: Cardiology, Cardiology Why: Cardiology  Hospital Follow-up on 09/18/2023 at 3:30 PM. Contact information: [address] [place] [address] [phone]                Discharge Exam: Fredricka Weights   08/17/23 0600 08/18/23 0628 08/19/23 0502  Weight: 106.9 kg 106 kg 105.1 kg   HEENT:  M[place]/AT, No thrush, no icterus CV:  RRR, no rub, no S3, no S4 Lung:  CTA, no wheeze, no rhonchi Abd:  soft/+BS, NT Ext:  No edema, no lymphangitis, no synovitis, no rash   Condition at discharge: stable  The results of significant diagnostics from this hospitalization (including imaging, microbiology, ancillary and laboratory) are listed below for reference.   Imaging Studies: ECHOCARDIOGRAM COMPLETE Result Date: 08/14/2023    ECHOCARDIOGRAM REPORT   Patient Name:   DOMENICO ACHORD Date of Exam: 08/14/2023 Medical Rec #:  [id number]              Height:       74.0 in Accession #:     [id number]             Weight:       242.9 lb Date of Birth:  June 07, 1976              BSA:          2.361 m Patient Age:    47 years               BP:           117/71 mmHg Patient Gender: M                      HR:           100 bpm. Exam Location:  [facility] Procedure: 2D Echo, Cardiac Doppler and Color Doppler Indications:    SVT (supraventricular tachycardia) (HCC)  History:        Patient has prior history of Echocardiogram examinations, most                 recent 01/22/2023. ETOH abuse; Risk Factors:Current Smoker and                 Dyslipidemia.  Sonographer:    Tillman Nora RVT RCS Referring Phys: [phone] Brecken Walth  Sonographer Comments: Technically difficult study due to poor echo windows, Technically challenging study due to limited acoustic windows, suboptimal parasternal window, suboptimal apical window, suboptimal subcostal window and patient is obese. Image acquisition challenging due to uncooperative patient. IMPRESSIONS  1. Left ventricular ejection fraction, by estimation, is 60 to 65%. The left ventricle has normal function. The left ventricle has no regional wall motion abnormalities. There is mild left ventricular hypertrophy. Left ventricular diastolic parameters are consistent with Grade I diastolic dysfunction (impaired relaxation).  2. Right ventricular systolic function is normal. The right ventricular size is normal.  3. The mitral valve is normal in structure. No evidence of mitral valve regurgitation. No evidence of mitral stenosis.  4. The aortic valve is tricuspid. Aortic valve regurgitation is not visualized. No aortic stenosis is present. FINDINGS  Left Ventricle: Left ventricular ejection fraction, by estimation, is 60 to 65%. The left ventricle has normal function. The left ventricle has no regional wall motion abnormalities. The left ventricular internal cavity size was normal in size. There is  mild left ventricular hypertrophy. Left ventricular diastolic parameters are consistent  with Grade I diastolic dysfunction (impaired relaxation). Normal left ventricular filling pressure. Right Ventricle: The right  ventricular size is normal. Right vetricular wall thickness was not well visualized. Right ventricular systolic function is normal. Left Atrium: Left atrial size was normal in size. Right Atrium: Right atrial size was normal in size. Pericardium: There is no evidence of pericardial effusion. Mitral Valve: The mitral valve is normal in structure. No evidence of mitral valve regurgitation. No evidence of mitral valve stenosis. Tricuspid Valve: The tricuspid valve is normal in structure. Tricuspid valve regurgitation is not demonstrated. No evidence of tricuspid stenosis. Aortic Valve: The aortic valve is tricuspid. Aortic valve regurgitation is not visualized. No aortic stenosis is present. Aortic valve mean gradient measures 2.0 mmHg. Aortic valve peak gradient measures 4.4 mmHg. Aortic valve area, by VTI measures 3.08 cm. Pulmonic Valve: The pulmonic valve was not well visualized. Pulmonic valve regurgitation is not visualized. No evidence of pulmonic stenosis. Aorta: The aortic root is normal in size and structure. IAS/Shunts: The interatrial septum was not well visualized.  LEFT VENTRICLE PLAX 2D LVIDd:         4.30 cm   Diastology LVIDs:         3.00 cm   LV e' medial:    6.53 cm/s LV PW:         1.20 cm   LV E/e' medial:  8.7 LV IVS:        1.30 cm   LV e' lateral:   7.18 cm/s LVOT diam:     2.10 cm   LV E/e' lateral: 7.9 LV SV:         48 LV SV Index:   20 LVOT Area:     3.46 cm  RIGHT VENTRICLE RV S prime:     14.50 cm/s LEFT ATRIUM             Index LA diam:        3.20 cm 1.36 cm/m LA Vol (A2C):   41.7 ml 17.66 ml/m LA Vol (A4C):   46.3 ml 19.61 ml/m LA Biplane Vol: 44.7 ml 18.93 ml/m  AORTIC VALVE                    PULMONIC VALVE AV Area (Vmax):    3.05 cm     PV Vmax:       0.92 m/s AV Area (Vmean):   3.03 cm     PV Peak grad:  3.4 mmHg AV Area (VTI):     3.08 cm AV Vmax:            105.00 cm/s AV Vmean:          70.600 cm/s AV VTI:            0.155 m AV Peak Grad:      4.4 mmHg AV Mean Grad:      2.0 mmHg LVOT Vmax:         92.40 cm/s LVOT Vmean:        61.800 cm/s LVOT VTI:          0.138 m LVOT/AV VTI ratio: 0.89  AORTA Ao Root diam: 3.80 cm Ao Asc diam:  3.50 cm MITRAL VALVE MV Area (PHT): 4.60 cm    SHUNTS MV Decel Time: 165 msec    Systemic VTI:  0.14 m MV E velocity: 56.90 cm/s  Systemic Diam: 2.10 cm MV A velocity: 88.10 cm/s MV E/A ratio:  0.65 Dorn Ross Connor Copeland Electronically signed by Dorn Ross Connor Copeland Signature Date/Time: 08/14/2023/12:31:06 PM    Final    CT HEAD  WO CONTRAST ( ) Result Date: 08/10/2023 CLINICAL DATA:  Altered mental status, nontraumatic (Ped [id number]). EXAM: CT HEAD WITHOUT CONTRAST TECHNIQUE: Contiguous axial images were obtained from the base of the skull through the vertex without intravenous contrast. RADIATION DOSE REDUCTION: This exam was performed according to the departmental dose-optimization program which includes automated exposure control, adjustment of the mA and/or kV according to patient size and/or use of iterative reconstruction technique. COMPARISON:  CT head June 25, 24. FINDINGS: Brain: No evidence of acute infarction, hemorrhage, hydrocephalus, extra-axial collection or mass lesion/mass effect. Vascular: No hyperdense vessel or unexpected calcification. Skull: No acute fracture. Sinuses/Orbits: Clear sinuses.  No acute orbital findings. Other: No mastoid effusions. IMPRESSION: Stable head CT.  No evidence of acute intracranial abnormality. Electronically Signed   By: Gilmore GORMAN Molt M.D.   On: 08/10/2023 02:21    Microbiology: Results for orders placed or performed during the hospital encounter of 08/08/23  MRSA Next Gen by PCR, Nasal     Status: None   Collection Time: 08/09/23  9:00 AM   Specimen: Nasal Mucosa; Nasal Swab  Result Value Ref Range Status   MRSA by PCR Next Gen NOT DETECTED NOT DETECTED Final    Comment:  (NOTE) The GeneXpert MRSA Assay (FDA approved for NASAL specimens only), is one component of a comprehensive MRSA colonization surveillance program. It is not intended to diagnose MRSA infection nor to guide or monitor treatment for MRSA infections. Test performance is not FDA approved in patients less than 42 years old. Performed at [facility], [address], [place], [place] [address]     Labs: CBC: Recent Labs  Lab 08/13/23 0435  WBC 4.3  HGB 10.0*  HCT 30.3*  MCV 92.9  PLT 250   Basic Metabolic Panel: Recent Labs  Lab 08/15/23 0445 08/16/23 0411 08/16/23 1637 08/17/23 0449 08/18/23 0415 08/19/23 0357  NA 130* 133*  --  131* 132* 135  K 3.7 4.0  --  3.8 3.7 3.9  CL 99 102  --  100 100 100  CO2 26 25  --  26 24 26   GLUCOSE 143* 131*  --  104* 90 91  BUN 10 11  --  11 10 11   CREATININE 0.54* 0.57*  --  0.59* 0.63 0.59*  CALCIUM  7.8* 8.0*  --  8.1* 8.3* 8.4*  MG 1.8 1.7 1.6*  --  1.5* 2.0   Liver Function Tests: No results for input(s): AST, ALT, ALKPHOS, BILITOT, PROT, ALBUMIN  in the last 168 hours. CBG: Recent Labs  Lab 08/18/23 0756 08/18/23 1126 08/18/23 1638 08/18/23 2101 08/19/23 0726  GLUCAP 80 113* 206* 111* 94    Discharge time spent: greater than 30 minutes.  Signed: Alm Schneider, Connor Copeland [facility] 08/19/2023

## 2023-08-17 NOTE — Plan of Care (Signed)
   Problem: Nutrition: Goal: Adequate nutrition will be maintained Outcome: Progressing   Problem: Coping: Goal: Level of anxiety will decrease Outcome: Progressing

## 2023-08-17 NOTE — Progress Notes (Addendum)
 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   ED evaluation: Blood pressure (!) 159/147, pulse (!) 109, temperature 98.9 F (37.2 C), temperature source Oral, resp. rate 18, SpO2 94%.  LABs; sodium 125, potassium 2.6, chloride 78, serum glucose 559, anion gap 21, WBC 10.2, UA unremarkable with exception of greater than 500 glucose Urine drug screen positive for opioids, marijuana  Neurology was consulted.  They recommended 5-day course of IVIG.  The patient was continued on his home antiepileptic medications and steroids.  Overall, he did show some improvement with his weakness, but continued to have deconditioning.  PT and OT were consulted and recommended CIR.  His hospitalization was also prolonged secondary to development of atrial tachycardia and SVT.       Assessment/Plan: CIDP exacerbation -Patient finished 5 days of IVIG--last dose 08/12/2023 -Resume prednisone  taper 40mg  currently, taper by 10mg  every week to stop  -Outpatient follow-up with Connor Copeland -Appreciate neurology consult   Alcohol  abuse and withdrawal -Patient required Precedex  and phenobarbital  -Finished phenobarbital  taper -Cessation discussed -Continue thiamine  and multivitamin -now improved   SVT/atrial tachycardia -Start short acting diltiazem >>long acting on 1/10 -TSH 0.9111 -1/9 Echo EF 60-65%, no WMA, G1DD, normal RVF -appreciate cardiology   Gait instability/deconditioning -PT/OT evaluation -Planning for CIR if insurance approves   Diabetes mellitus type  2 -No previous history of diabetes -08/08/2023 hemoglobin A1c 8.9 -Continue NovoLog  sliding scale -In part due to the patient's steroids -add tradjenta    Hyponatremia -Secondary to hyperglycemia and chronic steroid use -Overall stable   Peripheral neuropathy - Home medications Cymbalta , as needed oxycodone , Ultram , -Per neurology recommendation continue oxcarbazepine    Gouty arthritis -Continue Uloric    Hypokalemia/Hypomagnesemia -Repleting -Check magnesium  1.2>>1.8 -start daily po mag   Tobacco abuse -Tobacco cessation discussed -NicoDerm patch   Low folic acid  -Repleting   Peripheral neuropathy/neuropathic pain - Home medications Cymbalta , as needed hydrocodone  -Per neurology recommendation continue oxcarbazepine  And Lamictal    Lactic acidosis -sepsis ruled out -improved with IVF   Obesity -BMI 31.19 -lifestyle modification                   Family Communication:   mother updated  1/10   Consultants:  neurology   Code Status:  FULL   DVT Prophylaxis:  [place] Heparin      Procedures: As Listed in Progress Note Above   Antibiotics: Copeland             Subjective: Patient denies fevers, chills, headache, chest pain, dyspnea, nausea, vomiting, diarrhea, abdominal pain, dysuria, hematuria, hematochezia, and melena.  He walked around nursing station x 2 today with walker  Objective: Vitals:   08/16/23 1427 08/16/23 1954 08/17/23 0600 08/17/23 1346  BP: (!) 123/94 126/85  (!) 126/101  Pulse: 100 98  (!) 110  Resp: 18   18  Temp: 98 F (36.7 C) 98.4 F (36.9 C)  98.2 F (36.8 C)  TempSrc: Oral Oral  Oral  SpO2: 94% 94%  97%  Weight:   106.9 kg   Height:        Intake/Output Summary (Last 24 hours) at  08/17/2023 1637 Last data filed at 08/17/2023 1346 Gross per 24 hour  Intake 960 ml  Output --  Net 960 ml   Weight change: -0.6 kg Exam:  General:  Pt is alert, follows commands appropriately, not in acute distress HEENT: No  icterus, No thrush, No neck mass, [place]/AT Cardiovascular: RRR, S1/S2, no rubs, no gallops Respiratory: CTA bilaterally, no wheezing, no crackles, no rhonchi Abdomen: Soft/+BS, non tender, non distended, no guarding Extremities: No edema, No lymphangitis, No petechiae, No rashes, no synovitis   Data Reviewed: I have personally reviewed following labs and imaging studies Basic Metabolic Panel: Recent Labs  Lab 08/13/23 0435 08/14/23 0451 08/15/23 0445 08/16/23 0411 08/16/23 1637 08/17/23 0449  NA 130* 130* 130* 133*  --  131*  K 2.8* 3.3* 3.7 4.0  --  3.8  CL 93* 96* 99 102  --  100  CO2 29 27 26 25   --  26  GLUCOSE 146* 131* 143* 131*  --  104*  BUN 10 9 10 11   --  11  CREATININE 0.52* 0.58* 0.54* 0.57*  --  0.59*  CALCIUM  7.8* 8.0* 7.8* 8.0*  --  8.1*  MG  --  1.2* 1.8 1.7 1.6*  --    Liver Function Tests: No results for input(s): AST, ALT, ALKPHOS, BILITOT, PROT, ALBUMIN  in the last 168 hours. No results for input(s): LIPASE, AMYLASE in the last 168 hours. No results for input(s): AMMONIA in the last 168 hours. Coagulation Profile: No results for input(s): INR, PROTIME in the last 168 hours. CBC: Recent Labs  Lab 08/11/23 0426 08/12/23 0503 08/13/23 0435  WBC 4.1 4.0 4.3  HGB 10.2* 10.4* 10.0*  HCT 30.7* 31.8* 30.3*  MCV 93.3 93.5 92.9  PLT 256 235 250   Cardiac Enzymes: No results for input(s): CKTOTAL, CKMB, CKMBINDEX, TROPONINI in the last 168 hours. BNP: Invalid input(s): POCBNP CBG: Recent Labs  Lab 08/16/23 0749 08/16/23 1613 08/16/23 1953 08/17/23 0802 08/17/23 1108  GLUCAP 89 123* 204* 93 164*   HbA1C: No results for input(s): HGBA1C in the last 72 hours. Urine analysis:    Component Value Date/Time   COLORURINE YELLOW 08/08/2023 1219   APPEARANCEUR CLEAR 08/08/2023 1219   LABSPEC 1.026 08/08/2023 1219   PHURINE 6.0 08/08/2023 1219   GLUCOSEU >=500 (A) 08/08/2023 1219   HGBUR NEGATIVE 08/08/2023 1219    BILIRUBINUR NEGATIVE 08/08/2023 1219   KETONESUR 5 (A) 08/08/2023 1219   PROTEINUR NEGATIVE 08/08/2023 1219   NITRITE NEGATIVE 08/08/2023 1219   LEUKOCYTESUR NEGATIVE 08/08/2023 1219   Sepsis Labs: @LABRCNTIP (procalcitonin:4,lacticidven:4) ) Recent Results (from the past 240 hours)  MRSA Next Gen by PCR, Nasal     Status: Cop[place]   Collection Time: 08/09/23  9:00 AM   Specimen: Nasal Mucosa; Nasal Swab  Result Value Ref Range Status   MRSA by PCR Next Gen NOT DETECTED NOT DETECTED Final    Comment: (NOTE) The GeneXpert MRSA Assay (FDA approved for NASAL specimens only), is one component of a comprehensive MRSA colonization surveillance program. It is not intended to diagnose MRSA infection nor to guide or monitor treatment for MRSA infections. Test performance is not FDA approved in patients less than 10 years old. Performed at [facility], [address], [place], [place] [address]      Scheduled Meds:  diltiazem   120 mg Oral Daily   DULoxetine   60 mg Oral Daily   febuxostat   40 mg Oral Daily   folic acid   1 mg Oral Daily  heparin   5,000 Units Subcutaneous Q8H   insulin  aspart  0-6 Units Subcutaneous TID WC   insulin  starter kit- pen needles  1 kit Other Once   linagliptin   5 mg Oral Daily   magnesium  oxide  400 mg Oral BID   multivitamin with minerals  1 tablet Oral Daily   nicotine   21 mg Transdermal Daily   nortriptyline   50 mg Oral QHS   Oxcarbazepine   300 mg Oral Daily   OXcarbazepine   600 mg Oral QHS   pantoprazole   40 mg Oral Daily   potassium chloride   20 mEq Oral Daily   predniSONE   30 mg Oral Q breakfast   Followed by   NOREEN ON 08/24/2023] predniSONE   20 mg Oral Q breakfast   Followed by   NOREEN ON 08/31/2023] predniSONE   10 mg Oral Q breakfast   sodium chloride  flush  3 mL Intravenous Q12H   thiamine  (VITAMIN B1) injection  100 mg Intravenous Daily   Continuous Infusions:  Procedures/Studies: ECHOCARDIOGRAM COMPLETE Result Date: 08/14/2023     ECHOCARDIOGRAM REPORT   Patient Name:   DRACO MALCZEWSKI Date of Exam: 08/14/2023 Medical Rec #:  [id number]              Height:       74.0 in Accession #:    [id number]             Weight:       242.9 lb Date of Birth:  [birth year]              BSA:          2.361 m Patient Age:    47 years               BP:           117/71 mmHg Patient Gender: M                      HR:           100 bpm. Exam Location:  [facility] Procedure: 2D Echo, Cardiac Doppler and Color Doppler Indications:    SVT (supraventricular tachycardia) (HCC)  History:        Patient has prior history of Echocardiogram examinations, most                 recent 01/22/2023. ETOH abuse; Risk Factors:Current Smoker and                 Dyslipidemia.  Sonographer:    Copeland Connor RVT RCS Referring Phys: [phone] Connor Copeland  Sonographer Comments: Technically difficult study due to poor echo windows, Technically challenging study due to limited acoustic windows, suboptimal parasternal window, suboptimal apical window, suboptimal subcostal window and patient is obese. Image acquisition challenging due to uncooperative patient. IMPRESSIONS  1. Left ventricular ejection fraction, by estimation, is 60 to 65%. The left ventricle has normal function. The left ventricle has no regional wall motion abnormalities. There is mild left ventricular hypertrophy. Left ventricular diastolic parameters are consistent with Grade I diastolic dysfunction (impaired relaxation).  2. Right ventricular systolic function is normal. The right ventricular size is normal.  3. The mitral valve is normal in structure. No evidence of mitral valve regurgitation. No evidence of mitral stenosis.  4. The aortic valve is tricuspid. Aortic valve regurgitation is not visualized. No aortic stenosis is present. FINDINGS  Left Ventricle: Left ventricular ejection fraction, by estimation, is 60 to 65%. The left ventricle has  normal function. The left ventricle has no regional wall motion  abnormalities. The left ventricular internal cavity size was normal in size. There is  mild left ventricular hypertrophy. Left ventricular diastolic parameters are consistent with Grade I diastolic dysfunction (impaired relaxation). Normal left ventricular filling pressure. Right Ventricle: The right ventricular size is normal. Right vetricular wall thickness was not well visualized. Right ventricular systolic function is normal. Left Atrium: Left atrial size was normal in size. Right Atrium: Right atrial size was normal in size. Pericardium: There is no evidence of pericardial effusion. Mitral Valve: The mitral valve is normal in structure. No evidence of mitral valve regurgitation. No evidence of mitral valve stenosis. Tricuspid Valve: The tricuspid valve is normal in structure. Tricuspid valve regurgitation is not demonstrated. No evidence of tricuspid stenosis. Aortic Valve: The aortic valve is tricuspid. Aortic valve regurgitation is not visualized. No aortic stenosis is present. Aortic valve mean gradient measures 2.0 mmHg. Aortic valve peak gradient measures 4.4 mmHg. Aortic valve area, by VTI measures 3.08 cm. Pulmonic Valve: The pulmonic valve was not well visualized. Pulmonic valve regurgitation is not visualized. No evidence of pulmonic stenosis. Aorta: The aortic root is normal in size and structure. IAS/Shunts: The interatrial septum was not well visualized.  LEFT VENTRICLE PLAX 2D LVIDd:         4.30 cm   Diastology LVIDs:         3.00 cm   LV e' medial:    6.53 cm/s LV PW:         1.20 cm   LV E/e' medial:  8.7 LV IVS:        1.30 cm   LV e' lateral:   7.18 cm/s LVOT diam:     2.10 cm   LV E/e' lateral: 7.9 LV SV:         48 LV SV Index:   20 LVOT Area:     3.46 cm  RIGHT VENTRICLE RV S prime:     14.50 cm/s LEFT ATRIUM             Index LA diam:        3.20 cm 1.36 cm/m LA Vol (A2C):   41.7 ml 17.66 ml/m LA Vol (A4C):   46.3 ml 19.61 ml/m LA Biplane Vol: 44.7 ml 18.93 ml/m  AORTIC VALVE                     PULMONIC VALVE AV Area (Vmax):    3.05 cm     PV Vmax:       0.92 m/s AV Area (Vmean):   3.03 cm     PV Peak grad:  3.4 mmHg AV Area (VTI):     3.08 cm AV Vmax:           105.00 cm/s AV Vmean:          70.600 cm/s AV VTI:            0.155 m AV Peak Grad:      4.4 mmHg AV Mean Grad:      2.0 mmHg LVOT Vmax:         92.40 cm/s LVOT Vmean:        61.800 cm/s LVOT VTI:          0.138 m LVOT/AV VTI ratio: 0.89  AORTA Ao Root diam: 3.80 cm Ao Asc diam:  3.50 cm MITRAL VALVE MV Area (PHT): 4.60 cm    SHUNTS MV Decel Time: 165  msec    Systemic VTI:  0.14 m MV E velocity: 56.90 cm/s  Systemic Diam: 2.10 cm MV A velocity: 88.10 cm/s MV E/A ratio:  0.65 Connor Copeland Electronically signed by Connor Copeland Signature Date/Time: 08/14/2023/12:31:06 PM    Final    CT HEAD WO CONTRAST ( ) Result Date: 08/10/2023 CLINICAL DATA:  Altered mental status, nontraumatic (Ped 0-17y). EXAM: CT HEAD WITHOUT CONTRAST TECHNIQUE: Contiguous axial images were obtained from the base of the skull through the vertex without intravenous contrast. RADIATION DOSE REDUCTION: This exam was performed according to the departmental dose-optimization program which includes automated exposure control, adjustment of the mA and/or kV according to patient size and/or use of iterative reconstruction technique. COMPARISON:  CT head June 25, 24. FINDINGS: Brain: No evidence of acute infarction, hemorrhage, hydrocephalus, extra-axial collection or mass lesion/mass effect. Vascular: No hyperdense vessel or unexpected calcification. Skull: No acute fracture. Sinuses/Orbits: Clear sinuses.  No acute orbital findings. Other: No mastoid effusions. IMPRESSION: Stable head CT.  No evidence of acute intracranial abnormality. Electronically Signed   By: Connor GORMAN Molt M.D.   On: 08/10/2023 02:21    Connor Schneider, Connor  [facility]  If 7PM-7AM, please contact night-coverage [email or web address] Password [facility] 08/17/2023, 4:37 PM   LOS: 9 days

## 2023-08-17 NOTE — Plan of Care (Signed)
   Problem: Health Behavior/Discharge Planning: Goal: Ability to manage health-related needs will improve Outcome: Not Progressing

## 2023-08-17 NOTE — Progress Notes (Signed)
 Patient put on tennis shoes independently and walked around the unit twice while using the front wheel walker. Patient had gotten SOB but vitals WNL, Patient also walked 2 hallways without the walker and was stable on his feet.

## 2023-08-18 DIAGNOSIS — I471 Supraventricular tachycardia, unspecified: Secondary | ICD-10-CM | POA: Diagnosis not present

## 2023-08-18 DIAGNOSIS — G6181 Chronic inflammatory demyelinating polyneuritis: Secondary | ICD-10-CM | POA: Diagnosis not present

## 2023-08-18 DIAGNOSIS — F10932 Alcohol use, unspecified with withdrawal with perceptual disturbance: Secondary | ICD-10-CM | POA: Diagnosis not present

## 2023-08-18 LAB — BASIC METABOLIC PANEL
Anion gap: 8 (ref 5–15)
BUN: 10 mg/dL (ref 6–20)
CO2: 24 mmol/L (ref 22–32)
Calcium: 8.3 mg/dL — ABNORMAL LOW (ref 8.9–10.3)
Chloride: 100 mmol/L (ref 98–111)
Creatinine, Ser: 0.63 mg/dL (ref 0.61–1.24)
GFR, Estimated: >60 mL/min (ref >60)
Glucose, Bld: 90 mg/dL (ref 70–99)
Potassium: 3.7 mmol/L (ref 3.5–5.1)
Sodium: 132 mmol/L — ABNORMAL LOW (ref 135–145)

## 2023-08-18 LAB — GLUCOSE, CAPILLARY
Glucose-Capillary: 164 mg/dL — ABNORMAL HIGH (ref 70–99)
Glucose-Capillary: 80 mg/dL (ref 70–99)
Glucose-Capillary: 113 mg/dL — ABNORMAL HIGH (ref 70–99)
Glucose-Capillary: 206 mg/dL — ABNORMAL HIGH (ref 70–99)
Glucose-Capillary: 111 mg/dL — ABNORMAL HIGH (ref 70–99)

## 2023-08-18 LAB — MAGNESIUM: Magnesium: 1.5 mg/dL — ABNORMAL LOW (ref 1.7–2.4)

## 2023-08-18 MED ORDER — PREDNISONE 10 MG PO TABS: ORAL_TABLET | ORAL | Status: DC

## 2023-08-18 MED ORDER — LINAGLIPTIN 5 MG PO TABS: 5.0000 mg | ORAL_TABLET | Freq: Every day | ORAL | Status: DC

## 2023-08-18 MED ORDER — PANTOPRAZOLE SODIUM 40 MG PO TBEC: 40.0000 mg | DELAYED_RELEASE_TABLET | Freq: Every day | ORAL | Status: DC

## 2023-08-18 MED ORDER — MAGNESIUM SULFATE 2 GM/50ML IV SOLN
2.0000 g | Freq: Once | INTRAVENOUS | Status: AC
  Administered 2023-08-18: 2 g via INTRAVENOUS
  Filled 2023-08-18: qty 50

## 2023-08-18 MED ORDER — DILTIAZEM HCL ER COATED BEADS 120 MG PO CP24: 120.0000 mg | ORAL_CAPSULE | Freq: Every day | ORAL | Status: DC

## 2023-08-18 MED ORDER — MAGNESIUM OXIDE -MG SUPPLEMENT 400 (240 MG) MG PO TABS: 400.0000 mg | ORAL_TABLET | Freq: Two times a day (BID) | ORAL | Status: DC

## 2023-08-18 MED ORDER — POTASSIUM CHLORIDE CRYS ER 20 MEQ PO TBCR: 20.0000 meq | EXTENDED_RELEASE_TABLET | Freq: Every day | ORAL | Status: DC

## 2023-08-18 NOTE — TOC Progression Note (Signed)
 Transition of Care [facility]) - Progression Note    Patient Details  Name: Connor Copeland MRN: [id number] Date of Birth: 14-Jul-1976  Transition of Care [facility]) CM/SW Contact  Sharlyne Stabs, RN Phone Number: 08/18/2023, 12:09 PM  Clinical Narrative:   CIR does not have a bed available today. MD spoke with family. Patient is agreeable to stay another day. CIR updated to make him a priority for an open bed.    Expected Discharge Plan: Home/Self Care Barriers to Discharge: Continued Medical Work up  Expected Discharge Plan and Services      Social Determinants of Health (SDOH) Interventions SDOH Screenings   Food Insecurity: No Food Insecurity (08/08/2023)  Housing: Low Risk  (08/08/2023)  Transportation Needs: No Transportation Needs (08/08/2023)  Utilities: Not At Risk (08/08/2023)  Tobacco Use: High Risk (08/09/2023)    Readmission Risk Interventions     No data to display

## 2023-08-18 NOTE — H&P (Signed)
 Physical Medicine and Rehabilitation Admission H&P    Chief Complaint  Patient presents with   Alcohol  Intoxication  : HPI: Connor Copeland is a 47 year old right-handed male with history of CIDP (chronic  demyelinating polyneuropathy) followed by neurology services Dr. Vear as well as history of chronic alcohol  use diorder, gouty arthritis maintained on Uloric , seizure disorder maintained on Trileptal , chronic neuropathic pain, tobacco use.  Per chart review patient lives alone.  Two-level home/used a cane/rolling walker for ambulation.  Reports independence with ADLs.  His mother and family check on him as needed.  Presented 08/08/20[facility] to [facility] with progressive weakness increasing falls and unable to carry out his ADLs.  Admission chemistries unremarkable except urine drug screen positive for marijuana, sodium 125, potassium 2.6, glucose 559, hemoglobin A1c 8.9, osmolality 360,ammonia 24.  He was seen in consultation by neurology services suspect CIDP exacerbation placed on IVIG x 5 days completing 08/12/2023.  Hospital course complicated by alcohol  withdrawal he was initially in the ICU maintained on CIWA protocol and started on Precedex  drip as well as phenobarbital .  Cardiology services consulted Dr. Dorn Ross 08/14/2023 for evaluation of SVT and atrial tachycardia started on short acting Cardizem .  Echocardiogram ejection fraction of 60 to 65% no wall motion abnormalities.  There was mild left ventricular hypertrophy as well as grade 1 diastolic dysfunction.  His Cardizem  was transition to Cardizem  CD 120 mg daily 08/15/2023 with cardiac rate controlled.  Subcutaneous heparin  initiated for DVT prophylaxis.  His overall strength and mobility continues to improve he is being tapered off of prednisone .  Blood sugars monitored was no previous history of diabetes and monitoring of blood sugars with taper of prednisone  findings of noted elevated hemoglobin A1c of 8.9 with Tradjenta   initiated 08/13/2023 with sliding scale insulin .  Hyponatremia improving felt to be secondary to hyperglycemia chronic steroid use with latest sodium of 132.  Tolerating a regular consistency diet.  Therapy evaluations completed due to patient decreased functional mobility was admitted for a comprehensive rehab program.  Pt reports LBM last night.  Cannot take Gabapentin- because swells up severely in arms and legs- so cannot take lyrica  as well.    Said Norco 7.5 and tramadol  doesn't touch his nerve pain- explained they won't- because nerve pain doesn't improve with opiates.    Review of Systems  Constitutional:  Negative for chills and fever.  HENT:  Negative for hearing loss.   Eyes:  Negative for blurred vision and double vision.  Respiratory:  Negative for cough, shortness of breath and wheezing.   Cardiovascular:  Positive for palpitations. Negative for chest pain, leg swelling and PND.  Gastrointestinal:  Positive for constipation. Negative for heartburn, nausea and vomiting.  Genitourinary:  Negative for dysuria, flank pain, hematuria and urgency.  Musculoskeletal:  Positive for falls, joint pain and myalgias.  Skin:  Negative for rash.  Neurological:  Positive for weakness.  All other systems reviewed and are negative.  Past Medical History:  Diagnosis Date   ETOH abuse    Gout    Past Surgical History:  Procedure Laterality Date   FOOT SURGERY     Family History  Problem Relation Age of Onset   COPD Father    Social History:  reports that he has been smoking cigarettes. He has never used smokeless tobacco. He reports current alcohol  use of about 42.0 standard drinks of alcohol  per week. He reports that he does not use drugs. Allergies:  Allergies  Allergen Reactions  Gabapentin Swelling    Swelling off the legs   Medications Prior to Admission  Medication Sig Dispense Refill   DULoxetine  (CYMBALTA ) 60 MG capsule Take 1 capsule (60 mg total) by mouth daily. 30  capsule 11   fluticasone (FLONASE) 50 MCG/ACT nasal spray Place 1 spray into both nostrils daily.     HYDROcodone -acetaminophen  (NORCO) 7.5-325 MG tablet Take 1 tablet by mouth 3 (three) times daily as needed.     nicotine  (NICODERM CQ  - DOSED IN MG/24 HOURS) 21 mg/24hr patch Place 1 patch (21 mg total) onto the skin daily. 28 patch 1   nortriptyline  (PAMELOR ) 25 MG capsule Take 2 capsules (50 mg total) by mouth at bedtime. 60 capsule 11   Oxcarbazepine  (TRILEPTAL ) 300 MG tablet one po qAM and two po qHS 90 tablet 5   predniSONE  (DELTASONE ) 20 MG tablet Take 4 pills (80 mg) p.o. daily for 6 weeks.  Then take 3 pills p.o. daily x 2 weeks.  Then take 2 pills p.o. daily x 2 weeks.  Then take 1 pill a day until the follow-up visit with your neurologist 120 tablet 3   traMADol  (ULTRAM ) 50 MG tablet Take 1 tablet (50 mg total) by mouth in the morning, at noon, in the evening, and at bedtime. 120 tablet 3   ULORIC  80 MG TABS Take 40 mg by mouth daily.        Home: Home Living Family/patient expects to be discharged to:: Private residence Living Arrangements: Alone Available Help at Discharge: Family, Available PRN/intermittently Type of Home: House Home Access: Stairs to enter [employer] of Steps: 4 Entrance Stairs-Rails: Right, Left, Can reach both Home Layout: Two level Alternate Level Stairs-Number of Steps: 14 Alternate Level Stairs-Rails: Right Bathroom Shower/Tub: Engineer, [employer] Systems: Standard Bathroom Accessibility: Yes Home Equipment: [employer] (2 wheels), [employer] - single point Additional Comments: Pt reports living in ranch-poor historian   Functional History: Prior Function Prior Level of Function : Independent/Modified Independent Mobility Comments: Pt reports cane and RW ambulation. ADLs Comments: Reports independence with ADL's.  Functional Status:  Mobility: Bed Mobility Overal bed mobility: Modified Independent Bed Mobility: Supine to Sit,  Sit to Supine Rolling: Max assist Supine to sit: Mod assist Sit to supine: Supervision General bed mobility comments: moderate assist for supine to sit with flat bed level and trunk elevation. Supervision for return to supine from sitting position. Unsteady with BUE, pt given verbal and tactile cues for proper UB placement. Transfers Overall transfer level: Needs assistance Equipment used: Rolling walker (2 wheels) Transfers: Sit to/from Stand, Bed to chair/wheelchair/BSC Sit to Stand: Min assist Bed to/from chair/wheelchair/BSC transfer type:: Step pivot Step pivot transfers: Contact guard assist, Min assist General transfer comment: Min A to boost from EOB and chair. More CGA once standing. Ambulation/Gait Ambulation/Gait assistance: Contact guard assist, Min assist Gait Distance (Feet): 85 Feet Assistive device: Rolling walker (2 wheels) Gait Pattern/deviations: WFL(Within Functional Limits), Step-through pattern, Decreased step length - right, Decreased step length - left, Decreased stance time - right, Decreased stance time - left, Shuffle General Gait Details: 31ft of ambulation with RW. Unsteady navigating closed environment out to hallway given min assist and cues for RW management.  Once in straight away area, pt ambulating with CGA, unsteady with turning.    ADL: ADL Overall ADL's : Needs assistance/impaired Eating/Feeding: Modified independent, Sitting Grooming: Set up, Sitting Upper Body Bathing: Set up, Sitting Lower Body Bathing: Set up, Sitting/lateral leans Upper Body Dressing : Set up, Sitting Lower  Body Dressing: Set up, Sitting/lateral leans Toilet Transfer: Minimal assistance, Contact guard assist, Ambulation, Rolling walker (2 wheels) Toilet Transfer Details (indicate cue type and reason): Simulated via EOB to chair and ambulation in the hall. Toileting- Clothing Manipulation and Hygiene: Set up, Sitting/lateral lean Tub/ Shower Transfer: Minimal assistance,  Contact guard assist, Rolling walker (2 wheels), Ambulation Functional mobility during ADLs: Contact guard assist, Rolling walker (2 wheels) General ADL Comments: Able to ambulate around ICU ~over 100 feet with RW.  Cognition: Cognition Overall Cognitive Status: Within Functional Limits for tasks assessed Orientation Level: Oriented X4 Cognition Arousal: Alert Behavior During Therapy: WFL for tasks assessed/performed Overall Cognitive Status: Within Functional Limits for tasks assessed Area of Impairment: Orientation, Attention Orientation Level: Person, Place, Time, Situation Current Attention Level: Focused  Physical Exam: Blood pressure 121/85, pulse 86, temperature 98 F (36.7 C), temperature source Oral, resp. rate 17, height 6' 2 (1.88 m), weight 105.1 kg, SpO2 96%. Physical Exam Vitals and nursing note reviewed.  Constitutional:      Appearance: He is obese.     Comments: Sitting up in bed; just woke up; appropriate, alert, NAD  HENT:     Head: Normocephalic and atraumatic.     Right Ear: External ear normal.     Left Ear: External ear normal.     Nose: Nose normal. No congestion.     Mouth/Throat:     Mouth: Mucous membranes are dry.     Pharynx: Oropharynx is clear. No oropharyngeal exudate.  Eyes:     General:        Right eye: No discharge.        Left eye: No discharge.     Extraocular Movements: Extraocular movements intact.  Cardiovascular:     Rate and Rhythm: Normal rate and regular rhythm.     Heart sounds: No murmur heard.    No gallop.  Pulmonary:     Effort: Pulmonary effort is normal. No respiratory distress.     Breath sounds: Normal breath sounds. No wheezing, rhonchi or rales.  Abdominal:     General: There is no distension.     Tenderness: There is no abdominal tenderness.     Comments: Protuberant- but slightly hypoactive  Musculoskeletal:     Cervical back: Neck supple. No tenderness.     Comments: 5-/5 in Ue's and LE's- throughout all  muscles-   Skin:    General: Skin is warm and dry.     Comments: Ecchymoses on arms B/L   Neurological:     Mental Status: He is alert.     Comments: .  Patient is alert.  Follows simple commands.  Makes eye contact with examiner.  Cooperative with exam.  Provides name and age.  Limited with fair medical historian. Decreased to light touch from knees down- but also c/o nerve pain in arms and legs  Psychiatric:     Comments: Very flat     Results for orders placed or performed during the hospital encounter of 08/08/23 (from the past 48 hours)  Glucose, capillary     Status: Abnormal   Collection Time: 08/17/23 11:08 AM  Result Value Ref Range   Glucose-Capillary 164 (H) 70 - 99 mg/dL    Comment: Glucose reference range applies only to samples taken after fasting for at least 8 hours.  Glucose, capillary     Status: Abnormal   Collection Time: 08/17/23  4:57 PM  Result Value Ref Range   Glucose-Capillary 196 (H) 70 - 99  mg/dL    Comment: Glucose reference range applies only to samples taken after fasting for at least 8 hours.  Glucose, capillary     Status: Abnormal   Collection Time: 08/18/23  1:12 AM  Result Value Ref Range   Glucose-Capillary 164 (H) 70 - 99 mg/dL    Comment: Glucose reference range applies only to samples taken after fasting for at least 8 hours.   Comment 1 Notify RN   Basic metabolic panel     Status: Abnormal   Collection Time: 08/18/23  4:15 AM  Result Value Ref Range   Sodium 132 (L) 135 - 145 mmol/L   Potassium 3.7 3.5 - 5.1 mmol/L   Chloride 100 98 - 111 mmol/L   CO2 24 22 - 32 mmol/L   Glucose, Bld 90 70 - 99 mg/dL    Comment: Glucose reference range applies only to samples taken after fasting for at least 8 hours.   BUN 10 6 - 20 mg/dL   Creatinine, Ser 9.36 0.61 - 1.24 mg/dL   Calcium  8.3 (L) 8.9 - 10.3 mg/dL   GFR, Estimated >39 >39 mL/min    Comment: (NOTE) Calculated using the CKD-EPI Creatinine Equation (2021)    Anion gap 8 5 - 15     Comment: Performed at [facility], [address], [place], [place] [address]  Magnesium      Status: Abnormal   Collection Time: 08/18/23  4:15 AM  Result Value Ref Range   Magnesium  1.5 (L) 1.7 - 2.4 mg/dL    Comment: Performed at [facility], [address] [place], [place] [address]  Glucose, capillary     Status: None   Collection Time: 08/18/23  7:56 AM  Result Value Ref Range   Glucose-Capillary 80 70 - 99 mg/dL    Comment: Glucose reference range applies only to samples taken after fasting for at least 8 hours.  Glucose, capillary     Status: Abnormal   Collection Time: 08/18/23 11:26 AM  Result Value Ref Range   Glucose-Capillary 113 (H) 70 - 99 mg/dL    Comment: Glucose reference range applies only to samples taken after fasting for at least 8 hours.  Glucose, capillary     Status: Abnormal   Collection Time: 08/18/23  4:38 PM  Result Value Ref Range   Glucose-Capillary 206 (H) 70 - 99 mg/dL    Comment: Glucose reference range applies only to samples taken after fasting for at least 8 hours.  Glucose, capillary     Status: Abnormal   Collection Time: 08/18/23  9:01 PM  Result Value Ref Range   Glucose-Capillary 111 (H) 70 - 99 mg/dL    Comment: Glucose reference range applies only to samples taken after fasting for at least 8 hours.  Basic metabolic panel     Status: Abnormal   Collection Time: 08/19/23  3:57 AM  Result Value Ref Range   Sodium 135 135 - 145 mmol/L   Potassium 3.9 3.5 - 5.1 mmol/L   Chloride 100 98 - 111 mmol/L   CO2 26 22 - 32 mmol/L   Glucose, Bld 91 70 - 99 mg/dL    Comment: Glucose reference range applies only to samples taken after fasting for at least 8 hours.   BUN 11 6 - 20 mg/dL   Creatinine, Ser 9.40 (L) 0.61 - 1.24 mg/dL   Calcium  8.4 (L) 8.9 - 10.3 mg/dL   GFR, Estimated >39 >39 mL/min    Comment: (NOTE) Calculated using the CKD-EPI  Creatinine Equation (2021)    Anion gap 9 5 - 15    Comment: Performed at [facility], [address] [place], [place] [address]  Magnesium      Status: None   Collection Time: 08/19/23  3:57 AM  Result Value Ref Range   Magnesium  2.0 1.7 - 2.4 mg/dL    Comment: Performed at [facility], [address] [place], [place] [address]  Glucose, capillary     Status: None   Collection Time: 08/19/23  7:26 AM  Result Value Ref Range   Glucose-Capillary 94 70 - 99 mg/dL    Comment: Glucose reference range applies only to samples taken after fasting for at least 8 hours.   No results found.    Blood pressure 121/85, pulse 86, temperature 98 F (36.7 C), temperature source Oral, resp. rate 17, height 6' 2 (1.88 m), weight 105.1 kg, SpO2 96%.  Medical Problem List and Plan: 1. 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  Completed 5-day course of IVIG 08/12/2023.  Prednisone  taper as directed.  Follow-up outpatient neurology services Dr. Vear  -patient may  shower  -ELOS/Goals: ~2 weeks- Mod I  First day of evaluations 2.  Antithrombotics: -DVT/anticoagulation:  Pharmaceutical: Heparin   -antiplatelet therapy: N/A 3. Pain Management: Cymbalta  60 mg daily, tramadol /hydrocodone  as needed  1/15- will increase Cymbalta  to 120 mg daily- monitor Na- and see if helps nerve pain- allergic to gabapentin and Lyrica  due to swelling that was severe.  4. Mood/Behavior/Sleep: Pamelor  50 mg nightly  -antipsychotic agents:  5. Neuropsych/cognition: This patient is capable of making decisions on his own behalf. 6. Skin/Wound Care: Routine skin checks 7. Fluids/Electrolytes/Nutrition: Routine in and outs with follow-up chemistries 8.  Alcohol  abuse/withdrawal.  Patient required Precedex  and phenobarb during hospital stay that is since been discontinued.  Provide counseling  1/15- Thiamine  going to PO route, not IV anymore.  9.  SVT/atrial tachycardia.  Cardizem  120 mg daily.  Cardiac rate controlled.  Follow-up outpatient cardiology services 10.  New onset diabetes mellitus.  Hemoglobin A1c 8.9.   Tradjenta  5 mg daily.  No current plan for home insulin   1/15- CBGs looking good- con't regimen 11.  Hyponatremia.  Felt to be induced by hyperglycemia.  Follow-up chemistries  1/15 - Na 135 today- doing better 12.  Tobacco abuse.  Provide counseling.  Continue NicoDerm patch 13.  History of seizure disorder.Currently on  Trileptal  300 mg daily and 600 mg nightly.Lamictal  has been discontinued  1/15- pt asking to decrease Trileptal - said it makes him talk crazy- and his balance is off. Na 135- so doing better- Will call Dr Vear and see if can decrease Trileptal  at all? 14.  History of gouty arthritis.  Uloric  40 mg daily   Toribio JINNY Pitch, PA-C 08/19/2023   I have personally performed a face to face diagnostic evaluation of this patient and formulated the key components of the plan.  Additionally, I have personally reviewed laboratory data, imaging studies, as well as relevant notes and concur with the physician assistant's documentation above.   The patient's status has not changed from the original H&P.  Any changes in documentation from the acute care chart have been noted above.

## 2023-08-18 NOTE — Progress Notes (Signed)
 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   ED evaluation: Blood pressure (!) 159/147, pulse (!) 109, temperature 98.9 F (37.2 C), temperature source Oral, resp. rate 18, SpO2 94%.  LABs; sodium 125, potassium 2.6, chloride 78, serum glucose 559, anion gap 21, WBC 10.2, UA unremarkable with exception of greater than 500 glucose Urine drug screen positive for opioids, marijuana  Neurology was consulted.  They recommended 5-day course of IVIG.  The patient was continued on his home antiepileptic medications and steroids.  Overall, he did show some improvement with his weakness, but continued to have deconditioning.  PT and OT were consulted and recommended CIR.  His hospitalization was also prolonged secondary to development of atrial tachycardia and SVT. Echocardiogram was reassuring.  The patient was started on short acting diltiazem  initially.  His heart rate improved, and the patient was transition to oral long-acting diltiazem  without any further complications.  His potassium and magnesium  were optimized.  His functional status remained stable.  He continued to ambulate with a walker and ultimately agreed to go to CIR for intensive therapy .      Assessment/Plan: CIDP exacerbation -Patient finished 5 days of IVIG--last dose 08/12/2023 -Resume prednisone  taper 40mg  currently, taper by 10mg  every week to stop  -Prednisone  30 mg once daily started on 08/17/2023 -Outpatient follow-up with Dr. Vear -Appreciate neurology consult -PT eval >>CIR    Alcohol  abuse and withdrawal -Patient required Precedex  and phenobarbital  -Finished phenobarbital  taper -Cessation discussed -Continue thiamine  and multivitamin -now improved   SVT/atrial tachycardia -Start short acting diltiazem >>long acting on 08/15/23 -TSH 0.9111 -1/9 Echo EF 60-65%, no WMA, G1DD, normal RVF -appreciate cardiology -HR now controlled   Gait instability/deconditioning -PT/OT evaluation -Planning for CIR if insurance approves   Diabetes mellitus type 2 -No previous history of diabetes -08/08/2023 hemoglobin A1c 8.9 -Continue NovoLog  sliding scale -In part due to the patient's chronic steroids -added tradjenta  -CBGs overall relatively controlled--will not plan to DC home with insulin  at this point. -He will need continued diabetic education   Hyponatremia -Secondary to hyperglycemia and chronic steroid use -Overall stable/improving   Peripheral neuropathy - Home medications Cymbalta , as needed oxycodone , Ultram , -Per neurology recommendation continue oxcarbazepine  And Lamictal    Gouty arthritis -Continue Uloric    Hypokalemia/Hypomagnesemia -Repleting -Continue magnesium  oxide 400 mg twice daily have   Tobacco abuse -Tobacco cessation discussed -NicoDerm patch   Low folic acid  -Repleting   Peripheral neuropathy/neuropathic pain - Home medications Cymbalta , as needed hydrocodone  -Per neurology recommendation continue oxcarbazepine  And Lamictal    Lactic acidosis -sepsis ruled out -improved with IVF   Obesity -BMI 31.19 -lifestyle modification Morning to          Family Communication:   mother updated 1/13  Consultants:  neurology  Code Status:  FULL  DVT Prophylaxis:  [place] Heparin     Procedures: As Listed in Progress Note Above  Antibiotics: None        Subjective: Patient denies fevers, chills, headache, chest pain, dyspnea, nausea, vomiting, diarrhea, abdominal pain, dysuria, hematuria, hematochezia, and  melena.   Objective: Vitals:   08/17/23 1346 08/17/23 1900 08/18/23  0500 08/18/23 0628  BP: (!) 126/101 (!) 113/97 127/88   Pulse: (!) 110 94 86   Resp: 18 16 18    Temp: 98.2 F (36.8 C) 98.3 F (36.8 C) 97.9 F (36.6 C)   TempSrc: Oral Oral Oral   SpO2: 97% 98%    Weight:    106 kg  Height:        Intake/Output Summary (Last 24 hours) at 08/18/2023 1215 Last data filed at 08/17/2023 1346 Gross per 24 hour  Intake 240 ml  Output --  Net 240 ml   Weight change: -0.9 kg Exam:  General:  Pt is alert, follows commands appropriately, not in acute distress HEENT: No icterus, No thrush, No neck mass, [place]/AT Cardiovascular: RRR, S1/S2, no rubs, no gallops Respiratory: CTA bilaterally, no wheezing, no crackles, no rhonchi Abdomen: Soft/+BS, non tender, non distended, no guarding Extremities: No edema, No lymphangitis, No petechiae, No rashes, no synovitis   Data Reviewed: I have personally reviewed following labs and imaging studies Basic Metabolic Panel: Recent Labs  Lab 08/14/23 0451 08/15/23 0445 08/16/23 0411 08/16/23 1637 08/17/23 0449 08/18/23 0415  NA 130* 130* 133*  --  131* 132*  K 3.3* 3.7 4.0  --  3.8 3.7  CL 96* 99 102  --  100 100  CO2 27 26 25   --  26 24  GLUCOSE 131* 143* 131*  --  104* 90  BUN 9 10 11   --  11 10  CREATININE 0.58* 0.54* 0.57*  --  0.59* 0.63  CALCIUM  8.0* 7.8* 8.0*  --  8.1* 8.3*  MG 1.2* 1.8 1.7 1.6*  --  1.5*   Liver Function Tests: No results for input(s): AST, ALT, ALKPHOS, BILITOT, PROT, ALBUMIN  in the last 168 hours. No results for input(s): LIPASE, AMYLASE in the last 168 hours. No results for input(s): AMMONIA in the last 168 hours. Coagulation Profile: No results for input(s): INR, PROTIME in the last 168 hours. CBC: Recent Labs  Lab 08/12/23 0503 08/13/23 0435  WBC 4.0 4.3  HGB 10.4* 10.0*  HCT 31.8* 30.3*  MCV 93.5 92.9  PLT 235 250   Cardiac Enzymes: No results for input(s): CKTOTAL,  CKMB, CKMBINDEX, TROPONINI in the last 168 hours. BNP: Invalid input(s): POCBNP CBG: Recent Labs  Lab 08/17/23 1108 08/17/23 1657 08/18/23 0112 08/18/23 0756 08/18/23 1126  GLUCAP 164* 196* 164* 80 113*   HbA1C: No results for input(s): HGBA1C in the last 72 hours. Urine analysis:    Component Value Date/Time   COLORURINE YELLOW 08/08/2023 1219   APPEARANCEUR CLEAR 08/08/2023 1219   LABSPEC 1.026 08/08/2023 1219   PHURINE 6.0 08/08/2023 1219   GLUCOSEU >=500 (A) 08/08/2023 1219   HGBUR NEGATIVE 08/08/2023 1219   BILIRUBINUR NEGATIVE 08/08/2023 1219   KETONESUR 5 (A) 08/08/2023 1219   PROTEINUR NEGATIVE 08/08/2023 1219   NITRITE NEGATIVE 08/08/2023 1219   LEUKOCYTESUR NEGATIVE 08/08/2023 1219   Sepsis Labs: @LABRCNTIP (procalcitonin:4,lacticidven:4) ) Recent Results (from the past 240 hours)  MRSA Next Gen by PCR, Nasal     Status: None   Collection Time: 08/09/23  9:00 AM   Specimen: Nasal Mucosa; Nasal Swab  Result Value Ref Range Status   MRSA by PCR Next Gen NOT DETECTED NOT DETECTED Final    Comment: (NOTE) The GeneXpert MRSA Assay (FDA approved for NASAL specimens only), is one component of a comprehensive MRSA colonization surveillance program. It is not intended to diagnose MRSA infection nor to guide or monitor treatment for MRSA infections. Test  performance is not FDA approved in patients less than 107 years old. Performed at [facility], [address] [place], [place] [address]      Scheduled Meds:  diltiazem   120 mg Oral Daily   DULoxetine   60 mg Oral Daily   febuxostat   40 mg Oral Daily   folic acid   1 mg Oral Daily   heparin   5,000 Units Subcutaneous Q8H   insulin  aspart  0-6 Units Subcutaneous TID WC   insulin  starter kit- pen needles  1 kit Other Once   linagliptin   5 mg Oral Daily   magnesium  oxide  400 mg Oral BID   multivitamin with minerals  1 tablet Oral Daily   nicotine   21 mg Transdermal Daily   nortriptyline   50 mg Oral QHS    Oxcarbazepine   300 mg Oral Daily   OXcarbazepine   600 mg Oral QHS   pantoprazole   40 mg Oral Daily   potassium chloride   20 mEq Oral Daily   predniSONE   30 mg Oral Q breakfast   Followed by   NOREEN ON 08/24/2023] predniSONE   20 mg Oral Q breakfast   Followed by   NOREEN ON 08/31/2023] predniSONE   10 mg Oral Q breakfast   sodium chloride  flush  3 mL Intravenous Q12H   thiamine  (VITAMIN B1) injection  100 mg Intravenous Daily   Continuous Infusions:  magnesium  sulfate bolus IVPB      Procedures/Studies: ECHOCARDIOGRAM COMPLETE Result Date: 08/14/2023    ECHOCARDIOGRAM REPORT   Patient Name:   RANBIR CHEW Date of Exam: 08/14/2023 Medical Rec #:  [id number]              Height:       74.0 in Accession #:    [id number]             Weight:       242.9 lb Date of Birth:  1976/05/14              BSA:          2.361 m Patient Age:    47 years               BP:           117/71 mmHg Patient Gender: M                      HR:           100 bpm. Exam Location:  [facility] Procedure: 2D Echo, Cardiac Doppler and Color Doppler Indications:    SVT (supraventricular tachycardia) (HCC)  History:        Patient has prior history of Echocardiogram examinations, most                 recent 01/22/2023. ETOH abuse; Risk Factors:Current Smoker and                 Dyslipidemia.  Sonographer:    Tillman Nora RVT RCS Referring Phys: [phone] Betheny Suchecki  Sonographer Comments: Technically difficult study due to poor echo windows, Technically challenging study due to limited acoustic windows, suboptimal parasternal window, suboptimal apical window, suboptimal subcostal window and patient is obese. Image acquisition challenging due to uncooperative patient. IMPRESSIONS  1. Left ventricular ejection fraction, by estimation, is 60 to 65%. The left ventricle has normal function. The left ventricle has no regional wall motion abnormalities. There is mild left ventricular hypertrophy. Left ventricular diastolic parameters are  consistent with Grade I  diastolic dysfunction (impaired relaxation).  2. Right ventricular systolic function is normal. The right ventricular size is normal.  3. The mitral valve is normal in structure. No evidence of mitral valve regurgitation. No evidence of mitral stenosis.  4. The aortic valve is tricuspid. Aortic valve regurgitation is not visualized. No aortic stenosis is present. FINDINGS  Left Ventricle: Left ventricular ejection fraction, by estimation, is 60 to 65%. The left ventricle has normal function. The left ventricle has no regional wall motion abnormalities. The left ventricular internal cavity size was normal in size. There is  mild left ventricular hypertrophy. Left ventricular diastolic parameters are consistent with Grade I diastolic dysfunction (impaired relaxation). Normal left ventricular filling pressure. Right Ventricle: The right ventricular size is normal. Right vetricular wall thickness was not well visualized. Right ventricular systolic function is normal. Left Atrium: Left atrial size was normal in size. Right Atrium: Right atrial size was normal in size. Pericardium: There is no evidence of pericardial effusion. Mitral Valve: The mitral valve is normal in structure. No evidence of mitral valve regurgitation. No evidence of mitral valve stenosis. Tricuspid Valve: The tricuspid valve is normal in structure. Tricuspid valve regurgitation is not demonstrated. No evidence of tricuspid stenosis. Aortic Valve: The aortic valve is tricuspid. Aortic valve regurgitation is not visualized. No aortic stenosis is present. Aortic valve mean gradient measures 2.0 mmHg. Aortic valve peak gradient measures 4.4 mmHg. Aortic valve area, by VTI measures 3.08 cm. Pulmonic Valve: The pulmonic valve was not well visualized. Pulmonic valve regurgitation is not visualized. No evidence of pulmonic stenosis. Aorta: The aortic root is normal in size and structure. IAS/Shunts: The interatrial septum was not  well visualized.  LEFT VENTRICLE PLAX 2D LVIDd:         4.30 cm   Diastology LVIDs:         3.00 cm   LV e' medial:    6.53 cm/s LV PW:         1.20 cm   LV E/e' medial:  8.7 LV IVS:        1.30 cm   LV e' lateral:   7.18 cm/s LVOT diam:     2.10 cm   LV E/e' lateral: 7.9 LV SV:         48 LV SV Index:   20 LVOT Area:     3.46 cm  RIGHT VENTRICLE RV S prime:     14.50 cm/s LEFT ATRIUM             Index LA diam:        3.20 cm 1.36 cm/m LA Vol (A2C):   41.7 ml 17.66 ml/m LA Vol (A4C):   46.3 ml 19.61 ml/m LA Biplane Vol: 44.7 ml 18.93 ml/m  AORTIC VALVE                    PULMONIC VALVE AV Area (Vmax):    3.05 cm     PV Vmax:       0.92 m/s AV Area (Vmean):   3.03 cm     PV Peak grad:  3.4 mmHg AV Area (VTI):     3.08 cm AV Vmax:           105.00 cm/s AV Vmean:          70.600 cm/s AV VTI:            0.155 m AV Peak Grad:      4.4 mmHg AV Mean Grad:  2.0 mmHg LVOT Vmax:         92.40 cm/s LVOT Vmean:        61.800 cm/s LVOT VTI:          0.138 m LVOT/AV VTI ratio: 0.89  AORTA Ao Root diam: 3.80 cm Ao Asc diam:  3.50 cm MITRAL VALVE MV Area (PHT): 4.60 cm    SHUNTS MV Decel Time: 165 msec    Systemic VTI:  0.14 m MV E velocity: 56.90 cm/s  Systemic Diam: 2.10 cm MV A velocity: 88.10 cm/s MV E/A ratio:  0.65 Dorn Ross MD Electronically signed by Dorn Ross MD Signature Date/Time: 08/14/2023/12:31:06 PM    Final    CT HEAD WO CONTRAST ( ) Result Date: 08/10/2023 CLINICAL DATA:  Altered mental status, nontraumatic (Ped 0-17y). EXAM: CT HEAD WITHOUT CONTRAST TECHNIQUE: Contiguous axial images were obtained from the base of the skull through the vertex without intravenous contrast. RADIATION DOSE REDUCTION: This exam was performed according to the departmental dose-optimization program which includes automated exposure control, adjustment of the mA and/or kV according to patient size and/or use of iterative reconstruction technique. COMPARISON:  CT head June 25, 24. FINDINGS: Brain: No evidence of  acute infarction, hemorrhage, hydrocephalus, extra-axial collection or mass lesion/mass effect. Vascular: No hyperdense vessel or unexpected calcification. Skull: No acute fracture. Sinuses/Orbits: Clear sinuses.  No acute orbital findings. Other: No mastoid effusions. IMPRESSION: Stable head CT.  No evidence of acute intracranial abnormality. Electronically Signed   By: Gilmore GORMAN Molt M.D.   On: 08/10/2023 02:21    Alm Schneider, DO  [facility]  If 7PM-7AM, please contact night-coverage [email or web address] Password [email or web address] 08/18/2023, 12:15 PM   LOS: 10 days

## 2023-08-18 NOTE — Progress Notes (Signed)
 Inpatient Rehab Admissions Coordinator:  Received insurance approval. Awaiting bed availability.   Wolfgang Phoenix, MS, CCC-SLP Admissions Coordinator [phone]

## 2023-08-19 ENCOUNTER — Inpatient Hospital Stay (HOSPITAL_COMMUNITY)
Admission: AD | Admit: 2023-08-19 | Discharge: 2023-08-26 | DRG: 074 | Disposition: A | Discharge status: Home / Self Care | Payer: 59 | Source: Other Acute Inpatient Hospital | Attending: Physical Medicine and Rehabilitation | Admitting: Physical Medicine and Rehabilitation

## 2023-08-19 ENCOUNTER — Other Ambulatory Visit: Payer: Self-pay

## 2023-08-19 ENCOUNTER — Encounter (HOSPITAL_COMMUNITY): Payer: Self-pay | Admitting: Physical Medicine and Rehabilitation

## 2023-08-19 DIAGNOSIS — Z6829 Body mass index (BMI) 29.0-29.9, adult: Secondary | ICD-10-CM

## 2023-08-19 DIAGNOSIS — F10932 Alcohol use, unspecified with withdrawal with perceptual disturbance: Secondary | ICD-10-CM | POA: Diagnosis not present

## 2023-08-19 DIAGNOSIS — M109 Gout, unspecified: Secondary | ICD-10-CM | POA: Diagnosis present

## 2023-08-19 DIAGNOSIS — G40909 Epilepsy, unspecified, not intractable, without status epilepticus: Secondary | ICD-10-CM | POA: Diagnosis present

## 2023-08-19 DIAGNOSIS — G6181 Chronic inflammatory demyelinating polyneuritis: Principal | ICD-10-CM | POA: Diagnosis present

## 2023-08-19 DIAGNOSIS — Z888 Allergy status to other drugs, medicaments and biological substances status: Secondary | ICD-10-CM | POA: Diagnosis not present

## 2023-08-19 DIAGNOSIS — G629 Polyneuropathy, unspecified: Secondary | ICD-10-CM | POA: Diagnosis present

## 2023-08-19 DIAGNOSIS — Z716 Tobacco abuse counseling: Secondary | ICD-10-CM | POA: Diagnosis not present

## 2023-08-19 DIAGNOSIS — I4719 Other supraventricular tachycardia: Secondary | ICD-10-CM | POA: Diagnosis present

## 2023-08-19 DIAGNOSIS — F17210 Nicotine dependence, cigarettes, uncomplicated: Secondary | ICD-10-CM | POA: Diagnosis present

## 2023-08-19 DIAGNOSIS — I10 Essential (primary) hypertension: Secondary | ICD-10-CM | POA: Diagnosis present

## 2023-08-19 DIAGNOSIS — Z7952 Long term (current) use of systemic steroids: Secondary | ICD-10-CM

## 2023-08-19 DIAGNOSIS — Z7141 Alcohol abuse counseling and surveillance of alcoholic: Secondary | ICD-10-CM | POA: Diagnosis not present

## 2023-08-19 DIAGNOSIS — E669 Obesity, unspecified: Secondary | ICD-10-CM | POA: Diagnosis present

## 2023-08-19 DIAGNOSIS — E1165 Type 2 diabetes mellitus with hyperglycemia: Secondary | ICD-10-CM | POA: Diagnosis present

## 2023-08-19 DIAGNOSIS — Z79899 Other long term (current) drug therapy: Secondary | ICD-10-CM | POA: Diagnosis not present

## 2023-08-19 DIAGNOSIS — F1090 Alcohol use, unspecified, uncomplicated: Secondary | ICD-10-CM | POA: Diagnosis present

## 2023-08-19 DIAGNOSIS — K5900 Constipation, unspecified: Secondary | ICD-10-CM | POA: Diagnosis present

## 2023-08-19 DIAGNOSIS — I471 Supraventricular tachycardia, unspecified: Secondary | ICD-10-CM | POA: Diagnosis not present

## 2023-08-19 LAB — GLUCOSE, CAPILLARY
Glucose-Capillary: 94 mg/dL (ref 70–99)
Glucose-Capillary: 126 mg/dL — ABNORMAL HIGH (ref 70–99)
Glucose-Capillary: 152 mg/dL — ABNORMAL HIGH (ref 70–99)
Glucose-Capillary: 114 mg/dL — ABNORMAL HIGH (ref 70–99)

## 2023-08-19 LAB — CBC
HCT: 42.9 % (ref 39.0–52.0)
Hemoglobin: 14 g/dL (ref 13.0–17.0)
MCH: 30.7 pg (ref 26.0–34.0)
MCHC: 32.6 g/dL (ref 30.0–36.0)
MCV: 94.1 fL (ref 80.0–100.0)
Platelets: 714 10*3/uL — ABNORMAL HIGH (ref 150–400)
RBC: 4.56 MIL/uL (ref 4.22–5.81)
RDW: 16.1 % — ABNORMAL HIGH (ref 11.5–15.5)
WBC: 10.4 10*3/uL (ref 4.0–10.5)
nRBC: 0.2 % (ref 0.0–0.2)

## 2023-08-19 LAB — BASIC METABOLIC PANEL
Anion gap: 9 (ref 5–15)
BUN: 11 mg/dL (ref 6–20)
CO2: 26 mmol/L (ref 22–32)
Calcium: 8.4 mg/dL — ABNORMAL LOW (ref 8.9–10.3)
Chloride: 100 mmol/L (ref 98–111)
Creatinine, Ser: 0.59 mg/dL — ABNORMAL LOW (ref 0.61–1.24)
GFR, Estimated: >60 mL/min (ref >60)
Glucose, Bld: 91 mg/dL (ref 70–99)
Potassium: 3.9 mmol/L (ref 3.5–5.1)
Sodium: 135 mmol/L (ref 135–145)

## 2023-08-19 LAB — CREATININE, SERUM
Creatinine, Ser: 0.76 mg/dL (ref 0.61–1.24)
GFR, Estimated: >60 mL/min (ref >60)

## 2023-08-19 LAB — MAGNESIUM: Magnesium: 2 mg/dL (ref 1.7–2.4)

## 2023-08-19 MED ORDER — ADULT MULTIVITAMIN W/MINERALS CH
1.0000 | ORAL_TABLET | Freq: Every day | ORAL | Status: DC
  Administered 2023-08-20 – 2023-08-26 (×7): 1 via ORAL
  Filled 2023-08-19 (×7): qty 1

## 2023-08-19 MED ORDER — TRAMADOL HCL 50 MG PO TABS
50.0000 mg | ORAL_TABLET | Freq: Four times a day (QID) | ORAL | Status: DC | PRN
  Administered 2023-08-19 – 2023-08-26 (×14): 50 mg via ORAL
  Filled 2023-08-19 (×13): qty 1

## 2023-08-19 MED ORDER — OXCARBAZEPINE 300 MG PO TABS
300.0000 mg | ORAL_TABLET | Freq: Every day | ORAL | Status: DC
  Filled 2023-08-19: qty 1

## 2023-08-19 MED ORDER — PREDNISONE 20 MG PO TABS
20.0000 mg | ORAL_TABLET | Freq: Every day | ORAL | Status: DC
  Administered 2023-08-24 – 2023-08-26 (×3): 20 mg via ORAL
  Filled 2023-08-19 (×3): qty 1

## 2023-08-19 MED ORDER — ACETAMINOPHEN 650 MG RE SUPP: 650.0000 mg | Freq: Four times a day (QID) | RECTAL | Status: DC | PRN

## 2023-08-19 MED ORDER — POTASSIUM CHLORIDE CRYS ER 20 MEQ PO TBCR
20.0000 meq | EXTENDED_RELEASE_TABLET | Freq: Every day | ORAL | Status: DC
  Administered 2023-08-20 – 2023-08-26 (×7): 20 meq via ORAL
  Filled 2023-08-19 (×7): qty 1

## 2023-08-19 MED ORDER — MAGNESIUM OXIDE -MG SUPPLEMENT 400 (240 MG) MG PO TABS
400.0000 mg | ORAL_TABLET | Freq: Two times a day (BID) | ORAL | Status: DC
  Administered 2023-08-19 – 2023-08-26 (×14): 400 mg via ORAL
  Filled 2023-08-19 (×14): qty 1

## 2023-08-19 MED ORDER — FOLIC ACID 1 MG PO TABS
1.0000 mg | ORAL_TABLET | Freq: Every day | ORAL | Status: DC
  Administered 2023-08-20 – 2023-08-26 (×7): 1 mg via ORAL
  Filled 2023-08-19 (×7): qty 1

## 2023-08-19 MED ORDER — NORTRIPTYLINE HCL 25 MG PO CAPS
50.0000 mg | ORAL_CAPSULE | Freq: Every day | ORAL | Status: DC
  Administered 2023-08-19 – 2023-08-25 (×7): 50 mg via ORAL
  Filled 2023-08-19 (×7): qty 2

## 2023-08-19 MED ORDER — HEPARIN SODIUM (PORCINE) 5000 UNIT/ML IJ SOLN: 5000.0000 [IU] | Freq: Three times a day (TID) | INTRAMUSCULAR | Status: DC

## 2023-08-19 MED ORDER — POLYVINYL ALCOHOL 1.4 % OP SOLN
1.0000 [drp] | OPHTHALMIC | Status: DC | PRN
  Administered 2023-08-19: 1 [drp] via OPHTHALMIC
  Filled 2023-08-19: qty 15

## 2023-08-19 MED ORDER — BISACODYL 5 MG PO TBEC: 5.0000 mg | DELAYED_RELEASE_TABLET | Freq: Every day | ORAL | Status: DC | PRN

## 2023-08-19 MED ORDER — THIAMINE HCL 100 MG/ML IJ SOLN
100.0000 mg | Freq: Every day | INTRAMUSCULAR | Status: DC
  Filled 2023-08-19: qty 1

## 2023-08-19 MED ORDER — SENNOSIDES-DOCUSATE SODIUM 8.6-50 MG PO TABS: 1.0000 | ORAL_TABLET | Freq: Every evening | ORAL | Status: DC | PRN

## 2023-08-19 MED ORDER — ALBUTEROL SULFATE (2.5 MG/3ML) 0.083% IN NEBU: 2.5000 mg | INHALATION_SOLUTION | RESPIRATORY_TRACT | Status: DC | PRN

## 2023-08-19 MED ORDER — ONDANSETRON HCL 4 MG PO TABS: 4.0000 mg | ORAL_TABLET | Freq: Four times a day (QID) | ORAL | Status: DC | PRN

## 2023-08-19 MED ORDER — DILTIAZEM HCL ER COATED BEADS 120 MG PO CP24
120.0000 mg | ORAL_CAPSULE | Freq: Every day | ORAL | Status: DC
  Administered 2023-08-20 – 2023-08-26 (×7): 120 mg via ORAL
  Filled 2023-08-19 (×7): qty 1

## 2023-08-19 MED ORDER — ONDANSETRON HCL 4 MG/2ML IJ SOLN: 4.0000 mg | Freq: Four times a day (QID) | INTRAMUSCULAR | Status: DC | PRN

## 2023-08-19 MED ORDER — NICOTINE 21 MG/24HR TD PT24
21.0000 mg | MEDICATED_PATCH | Freq: Every day | TRANSDERMAL | Status: DC
  Administered 2023-08-20 – 2023-08-26 (×7): 21 mg via TRANSDERMAL
  Filled 2023-08-19 (×7): qty 1

## 2023-08-19 MED ORDER — PREDNISONE 10 MG PO TABS: 10.0000 mg | ORAL_TABLET | Freq: Every day | ORAL | Status: DC

## 2023-08-19 MED ORDER — ACETAMINOPHEN 325 MG PO TABS: 650.0000 mg | ORAL_TABLET | Freq: Four times a day (QID) | ORAL | Status: DC | PRN

## 2023-08-19 MED ORDER — LINAGLIPTIN 5 MG PO TABS
5.0000 mg | ORAL_TABLET | Freq: Every day | ORAL | Status: DC
  Administered 2023-08-20 – 2023-08-26 (×7): 5 mg via ORAL
  Filled 2023-08-19 (×7): qty 1

## 2023-08-19 MED ORDER — IPRATROPIUM BROMIDE 0.02 % IN SOLN: 0.5000 mg | Freq: Four times a day (QID) | RESPIRATORY_TRACT | Status: DC | PRN

## 2023-08-19 MED ORDER — HEPARIN SODIUM (PORCINE) 5000 UNIT/ML IJ SOLN
5000.0000 [IU] | Freq: Three times a day (TID) | INTRAMUSCULAR | Status: DC
  Administered 2023-08-19 – 2023-08-21 (×5): 5000 [IU] via SUBCUTANEOUS
  Filled 2023-08-19 (×5): qty 1

## 2023-08-19 MED ORDER — HYDROCODONE-ACETAMINOPHEN 7.5-325 MG PO TABS
1.0000 | ORAL_TABLET | Freq: Three times a day (TID) | ORAL | Status: DC | PRN
  Administered 2023-08-19 – 2023-08-26 (×17): 1 via ORAL
  Filled 2023-08-19 (×18): qty 1

## 2023-08-19 MED ORDER — INSULIN ASPART 100 UNIT/ML IJ SOLN
0.0000 [IU] | Freq: Three times a day (TID) | INTRAMUSCULAR | Status: DC
  Administered 2023-08-19 – 2023-08-22 (×4): 1 [IU] via SUBCUTANEOUS
  Administered 2023-08-22 – 2023-08-23 (×2): 2 [IU] via SUBCUTANEOUS
  Administered 2023-08-23: 3 [IU] via SUBCUTANEOUS
  Administered 2023-08-24: 2 [IU] via SUBCUTANEOUS
  Administered 2023-08-24: 1 [IU] via SUBCUTANEOUS

## 2023-08-19 MED ORDER — DULOXETINE HCL 30 MG PO CPEP: 60.0000 mg | ORAL_CAPSULE | Freq: Every day | ORAL | Status: DC

## 2023-08-19 MED ORDER — FEBUXOSTAT 40 MG PO TABS
40.0000 mg | ORAL_TABLET | Freq: Every day | ORAL | Status: DC
  Administered 2023-08-20 – 2023-08-26 (×7): 40 mg via ORAL
  Filled 2023-08-19 (×7): qty 1

## 2023-08-19 MED ORDER — PANTOPRAZOLE SODIUM 40 MG PO TBEC
40.0000 mg | DELAYED_RELEASE_TABLET | Freq: Every day | ORAL | Status: DC
  Administered 2023-08-20 – 2023-08-26 (×7): 40 mg via ORAL
  Filled 2023-08-19 (×7): qty 1

## 2023-08-19 MED ORDER — OXCARBAZEPINE 300 MG PO TABS
600.0000 mg | ORAL_TABLET | Freq: Every day | ORAL | Status: DC
  Administered 2023-08-19 – 2023-08-25 (×7): 600 mg via ORAL
  Filled 2023-08-19 (×7): qty 2

## 2023-08-19 MED ORDER — PREDNISONE 20 MG PO TABS
30.0000 mg | ORAL_TABLET | Freq: Every day | ORAL | Status: AC
  Administered 2023-08-20 – 2023-08-23 (×4): 30 mg via ORAL
  Filled 2023-08-19 (×4): qty 1

## 2023-08-19 NOTE — Progress Notes (Signed)
 Inpatient Rehab Admissions Coordinator:  There is a bed available for pt in CIR today. Dr. Arbutus Leas is aware and in agreement. Pt, NSG and TOC made aware.  Wolfgang Phoenix, MS, CCC-SLP Admissions Coordinator [phone]

## 2023-08-19 NOTE — Plan of Care (Signed)
   Problem: Education: Goal: Knowledge of General Education information will improve Description Including pain rating scale, medication(s)/side effects and non-pharmacologic comfort measures Outcome: Progressing   Problem: Health Behavior/Discharge Planning: Goal: Ability to manage health-related needs will improve Outcome: Progressing

## 2023-08-19 NOTE — Progress Notes (Addendum)
 Signed     Expand All Collapse All PMR Admission Coordinator Pre-Admission Assessment   Patient: Connor Copeland is an 48 y.o., male MRN: [id number] DOB: [birth year] Height: 6' 2 (188 cm) Weight: 105.1 kg   Insurance Information HMO:     PPO:      PCP:      IPA:      80/20: yes     OTHER: Group [id number] PRIMARY: [employer] options      Policy#: [id number]      Subscriber: patient CM Name: Philippe       Phone#: [phone] ext: [phone]   Fax#: [phone]  Pre-Cert#: [id number] Received approval from Deidre T. Pt approved 08/18/23-08/24/23. Updates due 6 days post admission.      Employer: Not working Benefits:  Phone #: [phone]     Name: [facility] provider portal Eff. Date: 08/06/23     Deduct: $1650 (met $1305.42)      Out of Pocket Max: $5000 [phone])      Life Max: n/a CIR: 80%      SNF: 80% Outpatient: 80%     Co-Pay: 20% Home Health: 80%      Co-Pay: 20% DME: 80%     Co-Pay: 20% Providers: in network  SECONDARY:       Policy#:      Phone#:    Artist:       Phone#:    The Data Processing Manager" for patients in Inpatient Rehabilitation Facilities with attached "Privacy Act Statement-Health Care Records" was provided and verbally reviewed with: Patient   Emergency Contact Information Contact Information       Name Relation Home Work Mobile    Granito,BRONWYN Mother [phone]             Other Contacts       Name Relation Home Work Mobile    [place] Sister     [phone]           Current Medical History  Patient Admitting Diagnosis: CIDP   History of Present Illness: a 48 y.o. man with a history of hyperlipidemia, alcohol  abuse, CIDP, seizure disorder, neuropathic pain who was admitted to [facility] on 08/08/23 for progressive weakness and increasing falls. He was started on a course of IVIG 400 mg/kg x 5 days for CIDP on 1/3. Per chart review, patient has had worsening of alcohol  withdrawal and has been  transferred to ICU for Precedex . Patient is unable to provide a history. He is agitated, in 2 point restraints, and mumbles unintelligible sounds. On exam, he has no lateralizing weakness. Face is symmetric. He has spontaneous symmetric antigravity movements of all 4 extremities. He resists eye opening. Labs are significant for Na 128, urine tox screen positive for opiates and THC. He drinks 42 alcoholic drinks per week. Patient does seem encephalopathic on exam. IVIG can rarely lead to PRES, but it is difficult to say whether this is what is going on because patient has multiple other issues that could be causing him to be encephalopathic (e.g. alcohol  withdrawal, hyponatremia). PT/OT evaluations completed with recommendations for acute inpatient rehab admission.     Patient's medical record from [facility] has been reviewed by the rehabilitation admission coordinator and physician.   Past Medical History      Past Medical History:  Diagnosis Date   ETOH abuse     Gout            Has the patient had major surgery during  100 days prior to admission? No   Family History   family history includes COPD in his father.   Current Medications  Current Medications    Current Facility-Administered Medications:    acetaminophen  (TYLENOL ) tablet 650 mg, 650 mg, Oral, Q6H PRN **OR** acetaminophen  (TYLENOL ) suppository 650 mg, 650 mg, Rectal, Q6H PRN, Tat, David, MD   bisacodyl  (DULCOLAX) EC tablet 5 mg, 5 mg, Oral, Daily PRN, Tat, David, MD   diltiazem  (CARDIZEM  CD) 24 hr capsule 120 mg, 120 mg, Oral, Daily, Tat, David, MD, 120 mg at 08/19/23 9085   DULoxetine  (CYMBALTA ) DR capsule 60 mg, 60 mg, Oral, Daily, Tat, David, MD, 60 mg at 08/19/23 9083   febuxostat  (ULORIC ) tablet 40 mg, 40 mg, Oral, Daily, Tat, David, MD, 40 mg at 08/19/23 9085   folic acid  (FOLVITE ) tablet 1 mg, 1 mg, Oral, Daily, Tat, David, MD, 1 mg at 08/19/23 9085   heparin  injection 5,000 Units, 5,000 Units,  Subcutaneous, Q8H, Tat, Alm, MD, 5,000 Units at 08/19/23 0510   hydrALAZINE  (APRESOLINE ) injection 10 mg, 10 mg, Intravenous, Q4H PRN, Tat, Alm, MD, 10 mg at 08/12/23 0343   HYDROcodone -acetaminophen  (NORCO) 7.5-325 MG per tablet 1 tablet, 1 tablet, Oral, TID PRN, Evonnie Alm, MD, 1 tablet at 08/19/23 9074   insulin  aspart (novoLOG ) injection 0-6 Units, 0-6 Units, Subcutaneous, TID WC, Tat, Alm, MD, 2 Units at 08/18/23 1754   insulin  starter kit- pen needles (English) 1 kit, 1 kit, Other, Once, Tat, Alm, MD   ipratropium (ATROVENT ) nebulizer solution 0.5 mg, 0.5 mg, Nebulization, Q6H PRN, Tat, David, MD   levalbuterol  (XOPENEX ) nebulizer solution 0.63 mg, 0.63 mg, Nebulization, Q6H PRN, Tat, Alm, MD   linagliptin  (TRADJENTA ) tablet 5 mg, 5 mg, Oral, Daily, Tat, David, MD, 5 mg at 08/19/23 9085   magnesium  oxide (MAG-OX) tablet 400 mg, 400 mg, Oral, BID, Tat, David, MD, 400 mg at 08/19/23 9085   multivitamin with minerals tablet 1 tablet, 1 tablet, Oral, Daily, Tat, David, MD, 1 tablet at 08/19/23 9085   nicotine  (NICODERM CQ  - dosed in mg/24 hours) patch 21 mg, 21 mg, Transdermal, Daily, Tat, David, MD, 21 mg at 08/19/23 9083   nortriptyline  (PAMELOR ) capsule 50 mg, 50 mg, Oral, QHS, Tat, Alm, MD, 50 mg at 08/18/23 2133   ondansetron  (ZOFRAN ) tablet 4 mg, 4 mg, Oral, Q6H PRN **OR** ondansetron  (ZOFRAN ) injection 4 mg, 4 mg, Intravenous, Q6H PRN, Tat, David, MD   Oral care mouth rinse, 15 mL, Mouth Rinse, PRN, Tat, Alm, MD   Oxcarbazepine  (TRILEPTAL ) tablet 300 mg, 300 mg, Oral, Daily, Tat, David, MD, 300 mg at 08/19/23 9085   Oxcarbazepine  (TRILEPTAL ) tablet 600 mg, 600 mg, Oral, QHS, Tat, Alm, MD, 600 mg at 08/18/23 2131   pantoprazole  (PROTONIX ) EC tablet 40 mg, 40 mg, Oral, Daily, Tat, David, MD, 40 mg at 08/19/23 9085   polyvinyl alcohol  (LIQUIFILM TEARS) 1.4 % ophthalmic solution 1 drop, 1 drop, Both Eyes, PRN, Tat, David, MD   potassium chloride  SA (KLOR-CON  M) CR tablet 20  mEq, 20 mEq, Oral, Daily, Tat, David, MD, 20 mEq at 08/19/23 0914   [COMPLETED] methylPREDNISolone  sodium succinate (SOLU-MEDROL ) 40 mg/mL injection 40 mg, 40 mg, Intravenous, Q24H, 40 mg at 08/16/23 1743 **FOLLOWED BY** predniSONE  (DELTASONE ) tablet 30 mg, 30 mg, Oral, Q breakfast, 30 mg at 08/19/23 0913 **FOLLOWED BY** [START ON 08/24/2023] predniSONE  (DELTASONE ) tablet 20 mg, 20 mg, Oral, Q breakfast **FOLLOWED BY** [START ON 08/31/2023] predniSONE  (DELTASONE ) tablet 10 mg, 10 mg, Oral, Q  breakfast, Tat, David, MD   senna-docusate (Senokot-S) tablet 1 tablet, 1 tablet, Oral, QHS PRN, Tat, David, MD   sodium chloride  flush (NS) 0.9 % injection 3 mL, 3 mL, Intravenous, Q12H, Tat, David, MD, 3 mL at 08/19/23 0917   sodium phosphate (FLEET) enema 1 enema, 1 enema, Rectal, Once PRN, Tat, David, MD   thiamine  (VITAMIN B1) injection 100 mg, 100 mg, Intravenous, Daily, Tat, David, MD, 100 mg at 08/19/23 9081   traMADol  (ULTRAM ) tablet 50 mg, 50 mg, Oral, Q6H PRN, Tat, David, MD, 50 mg at 08/19/23 0510     Patients Current Diet:  Diet Order                  Diet Carb Modified Fluid consistency: Thin  Diet effective now                         Precautions / Restrictions Precautions Precautions: Fall Restrictions Weight Bearing Restrictions Per Provider Order: No    Has the patient had 2 or more falls or a fall with injury in the past year? Yes   Prior Activity Level Community (5-7x/wk): Went out daily   Prior Functional Level Self Care: Did the patient need help bathing, dressing, using the toilet or eating? Independent   Indoor Mobility: Did the patient need assistance with walking from room to room (with or without device)? Independent   Stairs: Did the patient need assistance with internal or external stairs (with or without device)? Independent   Functional Cognition: Did the patient need help planning regular tasks such as shopping or remembering to take medications? Independent    Patient Information Are you of Hispanic, Latino/a,or Spanish origin?: A. No, not of Hispanic, Latino/a, or Spanish origin What is your race?: A. White Do you need or want an interpreter to communicate with a doctor or health care staff?: 0. No   Patient's Response To:  Health Literacy and Transportation Is the patient able to respond to health literacy and transportation needs?: Yes Health Literacy - How often do you need to have someone help you when you read instructions, pamphlets, or other written material from your doctor or pharmacy?: Never In the past 12 months, has lack of transportation kept you from medical appointments or from getting medications?: No In the past 12 months, has lack of transportation kept you from meetings, work, or from getting things needed for daily living?: No   Home Assistive Devices / Equipment Home Equipment: Agricultural Consultant (2 wheels), [employer] - single point   Prior Device Use: Indicate devices/aids used by the patient prior to current illness, exacerbation or injury? Walker and [employer]   Current Functional Level Cognition   Overall Cognitive Status: Within Functional Limits for tasks assessed Current Attention Level: Focused Orientation Level: Oriented X4    Extremity Assessment (includes Sensation/Coordination)   Upper Extremity Assessment: Generalized weakness (4+/5 grossly)  Lower Extremity Assessment: Defer to PT evaluation RLE Deficits / Details: 4-/5 knee extension MMT RLE Sensation: decreased light touch, decreased proprioception, history of peripheral neuropathy LLE Deficits / Details: 4-/5 knee extension MMT LLE Sensation: decreased light touch, decreased proprioception, history of peripheral neuropathy     ADLs   Overall ADL's : Needs assistance/impaired Eating/Feeding: Modified independent, Sitting Grooming: Set up, Sitting Upper Body Bathing: Set up, Sitting Lower Body Bathing: Set up, Sitting/lateral leans Upper Body Dressing : Set  up, Sitting Lower Body Dressing: Set up, Sitting/lateral leans Toilet Transfer: Minimal assistance, Contact guard  assist, Ambulation, Rolling walker (2 wheels) Toilet Transfer Details (indicate cue type and reason): Simulated via EOB to chair and ambulation in the hall. Toileting- Clothing Manipulation and Hygiene: Set up, Sitting/lateral lean Tub/ Shower Transfer: Minimal assistance, Contact guard assist, Rolling walker (2 wheels), Ambulation Functional mobility during ADLs: Contact guard assist, Rolling walker (2 wheels) General ADL Comments: Able to ambulate around ICU ~over 100 feet with RW.     Mobility   Overal bed mobility: Modified Independent Bed Mobility: Supine to Sit, Sit to Supine Rolling: Max assist Supine to sit: Mod assist Sit to supine: Supervision General bed mobility comments: moderate assist for supine to sit with flat bed level and trunk elevation. Supervision for return to supine from sitting position. Unsteady with BUE, pt given verbal and tactile cues for proper UB placement.     Transfers   Overall transfer level: Needs assistance Equipment used: Rolling walker (2 wheels) Transfers: Sit to/from Stand, Bed to chair/wheelchair/BSC Sit to Stand: Min assist Bed to/from chair/wheelchair/BSC transfer type:: Step pivot Step pivot transfers: Contact guard assist, Min assist General transfer comment: Min A to boost from EOB and chair. More CGA once standing.     Ambulation / Gait / Stairs / Wheelchair Mobility   Ambulation/Gait Ambulation/Gait assistance: Contact guard assist, Min assist Gait Distance (Feet): 85 Feet Assistive device: Rolling walker (2 wheels) Gait Pattern/deviations: WFL(Within Functional Limits), Step-through pattern, Decreased step length - right, Decreased step length - left, Decreased stance time - right, Decreased stance time - left, Shuffle General Gait Details: 8ft of ambulation with RW. Unsteady navigating closed environment out to hallway  given min assist and cues for RW management.  Once in straight away area, pt ambulating with CGA, unsteady with turning.     Posture / Balance Dynamic Sitting Balance Sitting balance - Comments: Sitting EOB Balance Overall balance assessment: Needs assistance Sitting-balance support: Feet supported, No upper extremity supported Sitting balance-Leahy Scale: Good Sitting balance - Comments: Sitting EOB Standing balance support: Bilateral upper extremity supported, During functional activity Standing balance-Leahy Scale: Fair Standing balance comment: fair with RW     Special needs/care consideration Skin Abrasion: leg/right; Scratch Marks: arm/bilateral; Ecchymosis: arm/bilateral; Diabetes Management: Novolog  0-6 units 3x daily with meals    Previous Home Environment (from acute therapy documentation) Living Arrangements: Alone Available Help at Discharge: Family, Available PRN/intermittently Type of Home: House Home Layout: Two level Alternate Level Stairs-Rails: Right Alternate Level Stairs-Number of Steps: 14 Home Access: Stairs to enter Entrance Stairs-Rails: Right, Left, Can reach both Entrance Stairs-Number of Steps: 4 Bathroom Shower/Tub: Associate Professor: Yes Home Care Services: No Additional Comments: Pt reports living in ranch-poor historian   Discharge Living Setting Plans for Discharge Living Setting: House, Alone Type of Home at Discharge: House Discharge Home Layout: One level Discharge Home Access: Stairs to enter Entrance Stairs-Rails: Right (in back) Entrance Stairs-Number of Steps: 2 in front, 6 in back Discharge Bathroom Shower/Tub: Walk-in shower, Door Discharge Bathroom Toilet: Handicapped height Discharge Bathroom Accessibility: Yes How Accessible: Accessible via wheelchair, Accessible via walker Does the patient have any problems obtaining your medications?: No   Social/Family/Support Systems Patient  Roles: Other (Comment) (Has mom and sister) Contact Information: Korvin Valentine - mother - [phone] Anticipated Caregiver: self Caregiver Availability: Intermittent Discharge Plan Discussed with Primary Caregiver: Yes Is Caregiver In Agreement with Plan?: Yes Does Caregiver/Family have Issues with Lodging/Transportation while Pt is in Rehab?: No   Goals Patient/Family Goal for Rehab: PT/OT mod I goals  Expected length of stay: 7-10 days Pt/Family Agrees to Admission and willing to participate: Yes Program Orientation Provided & Reviewed with Pt/Caregiver Including Roles  & Responsibilities: Yes   Decrease burden of Care through IP rehab admission: N/A   Possible need for SNF placement upon discharge: Not planned   Patient Condition: I have reviewed medical records from [facility], spoken with CM, and patient. I discussed via phone for inpatient rehabilitation assessment.  Patient will benefit from ongoing PT and OT, can actively participate in 3 hours of therapy a day 5 days of the week, and can make measurable gains during the admission.  Patient will also benefit from the coordinated team approach during an Inpatient Acute Rehabilitation admission.  The patient will receive intensive therapy as well as Rehabilitation physician, nursing, social worker, and care management interventions.  Due to safety, skin/wound care, disease management, medication administration, pain management, and patient education the patient requires 24 hour a day rehabilitation nursing.  The patient is currently Contact G-Min A with mobility and Set up-Min A with basic ADLs.  Discharge setting and therapy post discharge at home with home health is anticipated.  Patient has agreed to participate in the Acute Inpatient Rehabilitation Program and will admit today.   Preadmission Screen Completed By:  Lovett CHRISTELLA Ropes, with updates by [name] Yvone Cohens 08/19/2023 9:46  AM ______________________________________________________________________   Discussed status with Dr. Lovorn on 08/19/23  at 9:46 AM and received approval for admission today.   Admission Coordinator:  Lovett CHRISTELLA Ropes, RN, time 9:46 AM/Date 08/19/23     Assessment/Plan: Diagnosis: CIDP Does the need for close, 24 hr/day Medical supervision in concert with the patient's rehab needs make it unreasonable for this patient to be served in a less intensive setting? Yes Co-Morbidities requiring supervision/potential complications: Alcohol  use disorder with CIW protocol for withdrawal; new dx of DM- A1c 8.9; seizure d/o; overweight; tobacco abuse; gout- hyponatremia, hypokalemia and hypomagnesemia;nerve pain Due to bladder management, bowel management, safety, skin/wound care, disease management, medication administration, pain management, and patient education, does the patient require 24 hr/day rehab nursing? Yes Does the patient require coordinated care of a physician, rehab nurse, PT, OT, and SLP to address physical and functional deficits in the context of the above medical diagnosis(es)? Yes Addressing deficits in the following areas: balance, endurance, locomotion, strength, transferring, bowel/bladder control, bathing, dressing, feeding, grooming, toileting, and cognition Can the patient actively participate in an intensive therapy program of at least 3 hrs of therapy 5 days a week? Yes The potential for patient to make measurable gains while on inpatient rehab is good Anticipated functional outcomes upon discharge from inpatient rehab: modified independent PT, modified independent OT, n/a SLP Estimated rehab length of stay to reach the above functional goals is: 7-10 days, but has to be able ot go up 14 steps at home Anticipated discharge destination: Home 10. Overall Rehab/Functional Prognosis: good     MD Signature:

## 2023-08-19 NOTE — TOC Transition Note (Signed)
 Transition of Care [facility]) - Discharge Note   Patient Details  Name: Connor Copeland MRN: [id number] Date of Birth: 1975/09/17  Transition of [facility]) CM/SW Contact:  Sharlyne Stabs, RN Phone Number: 08/19/2023, 10:48 AM   Clinical Narrative:   CIR has a bed today. Patient is ready to discharge. RN will call report.    Final next level of care: IP Rehab Facility Barriers to Discharge: Barriers Resolved   Patient Goals and CMS Choice Patient states their goals for this hospitalization and ongoing recovery are:: agreeable to CIR CMS Medicare.gov Compare Post Acute Care list provided to:: Patient Choice offered to / list presented to : Patient     Discharge Placement    Patient and family notified of of transfer: 08/19/23  Discharge Plan and Services Additional resources added to the After Visit Summary for         Social Drivers of Health (SDOH) Interventions SDOH Screenings   Food Insecurity: No Food Insecurity (08/08/2023)  Housing: Low Risk  (08/08/2023)  Transportation Needs: No Transportation Needs (08/08/2023)  Utilities: Not At Risk (08/08/2023)  Tobacco Use: High Risk (08/09/2023)     Readmission Risk Interventions    08/19/2023   10:48 AM  Readmission Risk Prevention Plan  Transportation Screening Complete  PCP or Specialist Appt within 3-5 Days Not Complete  HRI or Home Care Consult Complete  Social Work Consult for Recovery Care Planning/Counseling Complete  Palliative Care Screening Not Applicable  Medication Review Oceanographer) Complete

## 2023-08-20 DIAGNOSIS — G6181 Chronic inflammatory demyelinating polyneuritis: Secondary | ICD-10-CM

## 2023-08-20 LAB — CBC WITH DIFFERENTIAL/PLATELET
Abs Immature Granulocytes: 0.11 10*3/uL — ABNORMAL HIGH (ref 0.00–0.07)
Basophils Absolute: 0.1 10*3/uL (ref 0.0–0.1)
Basophils Relative: 1 %
Eosinophils Absolute: 0.1 10*3/uL (ref 0.0–0.5)
Eosinophils Relative: 1 %
HCT: 36.6 % — ABNORMAL LOW (ref 39.0–52.0)
Hemoglobin: 12.3 g/dL — ABNORMAL LOW (ref 13.0–17.0)
Immature Granulocytes: 1 %
Lymphocytes Relative: 38 %
Lymphs Abs: 3.4 10*3/uL (ref 0.7–4.0)
MCH: 31.5 pg (ref 26.0–34.0)
MCHC: 33.6 g/dL (ref 30.0–36.0)
MCV: 93.6 fL (ref 80.0–100.0)
Monocytes Absolute: 0.7 10*3/uL (ref 0.1–1.0)
Monocytes Relative: 8 %
Neutro Abs: 4.6 10*3/uL (ref 1.7–7.7)
Neutrophils Relative %: 51 %
Platelets: 604 10*3/uL — ABNORMAL HIGH (ref 150–400)
RBC: 3.91 MIL/uL — ABNORMAL LOW (ref 4.22–5.81)
RDW: 15.9 % — ABNORMAL HIGH (ref 11.5–15.5)
WBC: 8.8 10*3/uL (ref 4.0–10.5)
nRBC: 0 % (ref 0.0–0.2)

## 2023-08-20 LAB — COMPREHENSIVE METABOLIC PANEL
ALT: 30 U/L (ref 0–44)
AST: 37 U/L (ref 15–41)
Albumin: 2.3 g/dL — ABNORMAL LOW (ref 3.5–5.0)
Alkaline Phosphatase: 85 U/L (ref 38–126)
Anion gap: 9 (ref 5–15)
BUN: 9 mg/dL (ref 6–20)
CO2: 24 mmol/L (ref 22–32)
Calcium: 8.5 mg/dL — ABNORMAL LOW (ref 8.9–10.3)
Chloride: 102 mmol/L (ref 98–111)
Creatinine, Ser: 0.73 mg/dL (ref 0.61–1.24)
GFR, Estimated: >60 mL/min (ref >60)
Glucose, Bld: 107 mg/dL — ABNORMAL HIGH (ref 70–99)
Potassium: 4.1 mmol/L (ref 3.5–5.1)
Sodium: 135 mmol/L (ref 135–145)
Total Bilirubin: 0.9 mg/dL (ref 0.0–1.2)
Total Protein: 5.8 g/dL — ABNORMAL LOW (ref 6.5–8.1)

## 2023-08-20 LAB — GLUCOSE, CAPILLARY
Glucose-Capillary: 101 mg/dL — ABNORMAL HIGH (ref 70–99)
Glucose-Capillary: 147 mg/dL — ABNORMAL HIGH (ref 70–99)
Glucose-Capillary: 181 mg/dL — ABNORMAL HIGH (ref 70–99)
Glucose-Capillary: 154 mg/dL — ABNORMAL HIGH (ref 70–99)

## 2023-08-20 MED ORDER — THIAMINE MONONITRATE 100 MG PO TABS
100.0000 mg | ORAL_TABLET | Freq: Every day | ORAL | Status: DC
  Administered 2023-08-20 – 2023-08-26 (×7): 100 mg via ORAL
  Filled 2023-08-20 (×7): qty 1

## 2023-08-20 MED ORDER — DULOXETINE HCL 30 MG PO CPEP
120.0000 mg | ORAL_CAPSULE | Freq: Every day | ORAL | Status: DC
  Administered 2023-08-20 – 2023-08-26 (×7): 120 mg via ORAL
  Filled 2023-08-20 (×8): qty 4

## 2023-08-20 MED ORDER — OXCARBAZEPINE 300 MG PO TABS
300.0000 mg | ORAL_TABLET | Freq: Every day | ORAL | Status: DC
  Administered 2023-08-20 – 2023-08-26 (×7): 300 mg via ORAL
  Filled 2023-08-20 (×7): qty 1

## 2023-08-20 NOTE — Plan of Care (Signed)
  Problem: RH Dressing Goal: LTG Patient will perform lower body dressing w/assist (OT) Description: LTG: Patient will perform lower body dressing with assist, with/without cues in positioning using equipment (OT) Flowsheets (Taken 08/20/2023 1207) LTG: Pt will perform lower body dressing with assistance level of: Independent   Problem: RH Toileting Goal: LTG Patient will perform toileting task (3/3 steps) with assistance level (OT) Description: LTG: Patient will perform toileting task (3/3 steps) with assistance level (OT)  Flowsheets (Taken 08/20/2023 1207) LTG: Pt will perform toileting task (3/3 steps) with assistance level: Independent with assistive device   Problem: RH Toilet Transfers Goal: LTG Patient will perform toilet transfers w/assist (OT) Description: LTG: Patient will perform toilet transfers with assist, with/without cues using equipment (OT) Flowsheets (Taken 08/20/2023 1207) LTG: Pt will perform toilet transfers with assistance level of: Independent   Problem: RH Pre-functional/Other (Specify) Goal: RH LTG OT (Specify) 1 Description: RH LTG OT (Specify) 1 Flowsheets (Taken 08/20/2023 1207) LTG: Other OT (Specify) 1: Pt will rate a 8 or les on the BORG scale during a functional ADL task

## 2023-08-20 NOTE — Progress Notes (Signed)
 Inpatient Rehabilitation  Patient information reviewed and entered into eRehab system by Cheri Rous, OTR/L, Rehab Quality Coordinator.   Information including medical coding, functional ability and quality indicators will be reviewed and updated through discharge.

## 2023-08-20 NOTE — Progress Notes (Signed)
 Patient ID: DRILON USSERY, male   DOB: Oct 11, 1975, 48 y.o.   MRN: [id number]  [id number] left message for pt mother Everlena Hoard to introduce self, explain role, discuss discharge process, and confirm discharge plan. SW will follow-up.   1441-SW returned phone call to pt mother to introduce self. She confirms pt will be home majority by himself, however she is able to come to home to provide supervision only-not able to physically assist him. She is concerned that previous he was not able to stand for himself and she knows she would not ne able to lift him if he were to fall. His sister works 12hr shifts and not likely available.   Norval Been, MSW, LCSW Office: [phone] Cell: [phone] Fax: [phone]

## 2023-08-20 NOTE — Plan of Care (Signed)
  Problem: RH Balance Goal: LTG Patient will maintain dynamic standing balance (PT) Description: LTG:  Patient will maintain dynamic standing balance with assistance during mobility activities (PT) Flowsheets (Taken 08/20/2023 1239) LTG: Pt will maintain dynamic standing balance during mobility activities with:: Independent with assistive device    Problem: Sit to Stand Goal: LTG:  Patient will perform sit to stand with assistance level (PT) Description: LTG:  Patient will perform sit to stand with assistance level (PT) Flowsheets (Taken 08/20/2023 1239) LTG: PT will perform sit to stand in preparation for functional mobility with assistance level: Independent   Problem: RH Bed Mobility Goal: LTG Patient will perform bed mobility with assist (PT) Description: LTG: Patient will perform bed mobility with assistance, with/without cues (PT). Flowsheets (Taken 08/20/2023 1239) LTG: Pt will perform bed mobility with assistance level of: Independent   Problem: RH Bed to Chair Transfers Goal: LTG Patient will perform bed/chair transfers w/assist (PT) Description: LTG: Patient will perform bed to chair transfers with assistance (PT). Flowsheets (Taken 08/20/2023 1239) LTG: Pt will perform Bed to Chair Transfers with assistance level: Independent   Problem: RH Car Transfers Goal: LTG Patient will perform car transfers with assist (PT) Description: LTG: Patient will perform car transfers with assistance (PT). Flowsheets (Taken 08/20/2023 1239) LTG: Pt will perform car transfers with assist:: Independent with assistive device    Problem: RH Furniture Transfers Goal: LTG Patient will perform furniture transfers w/assist (OT/PT) Description: LTG: Patient will perform furniture transfers  with assistance (OT/PT). Flowsheets (Taken 08/20/2023 1239) LTG: Pt will perform furniture transfers with assist:: Independent with assistive device    Problem: RH Floor Transfers Goal: LTG Patient will perform  floor transfers w/assist (PT) Description: LTG: Patient will perform floor transfers with assistance (PT). Flowsheets (Taken 08/20/2023 1239) LTG: PT WILL PERFORM FLOOR TRANFERS  WITH  ASSIST:: Independent with assistive device    Problem: RH Ambulation Goal: LTG Patient will ambulate in controlled environment (PT) Description: LTG: Patient will ambulate in a controlled environment, # of feet with assistance (PT). Flowsheets (Taken 08/20/2023 1239) LTG: Pt will ambulate in controlled environ  assist needed:: Independent with assistive device LTG: Ambulation distance in controlled environment: >150 ft using LRAD Goal: LTG Patient will ambulate in home environment (PT) Description: LTG: Patient will ambulate in home environment, # of feet with assistance (PT). Flowsheets (Taken 08/20/2023 1239) LTG: Pt will ambulate in home environ  assist needed:: Independent LTG: Ambulation distance in home environment: up to 50-38ft per bout with no AD   Problem: RH Stairs Goal: LTG Patient will ambulate up and down stairs w/assist (PT) Description: LTG: Patient will ambulate up and down # of stairs with assistance (PT) Flowsheets (Taken 08/20/2023 1239) LTG: Pt will ambulate up/down stairs assist needed:: Supervision/Verbal cueing LTG: Pt will  ambulate up and down number of stairs: at least 6 steps using RHR as per home environment in order to enter/ exit home

## 2023-08-20 NOTE — H&P (Signed)
 Physical Medicine and Rehabilitation Admission H&P        Chief Complaint  Patient presents with   Alcohol  Intoxication  : HPI: Connor Copeland is a 48 year old right-handed male with history of CIDP (chronic  demyelinating polyneuropathy) followed by neurology services Dr. Godwin Lat as well as history of chronic alcohol  use diorder, gouty arthritis maintained on Uloric , seizure disorder maintained on Trileptal , chronic neuropathic pain, tobacco use.  Per chart review patient lives alone.  Two-level home/used a cane/rolling walker for ambulation.  Reports independence with ADLs.  His mother and family check on him as needed.  Presented 08/08/2023 to [facility] with progressive weakness increasing falls and unable to carry out his ADLs.  Admission chemistries unremarkable except urine drug screen positive for marijuana, sodium 125, potassium 2.6, glucose 559, hemoglobin A1c 8.9, osmolality 360,ammonia 24.  He was seen in consultation by neurology services suspect CIDP exacerbation placed on IVIG x 5 days completing 08/12/2023.  Hospital course complicated by alcohol  withdrawal he was initially in the ICU maintained on CIWA protocol and started on Precedex  drip as well as phenobarbital .  Cardiology services consulted Dr. Armida Lander 08/14/2023 for evaluation of SVT and atrial tachycardia started on short acting Cardizem .  Echocardiogram ejection fraction of 60 to 65% no wall motion abnormalities.  There was mild left ventricular hypertrophy as well as grade 1 diastolic dysfunction.  His Cardizem  was transition to Cardizem  CD 120 mg daily 08/15/2023 with cardiac rate controlled.  Subcutaneous heparin  initiated for DVT prophylaxis.  His overall strength and mobility continues to improve he is being tapered off of prednisone .  Blood sugars monitored was no previous history of diabetes and monitoring of blood sugars with taper of prednisone  findings of noted elevated hemoglobin A1c of 8.9 with  Tradjenta  initiated 08/13/2023 with sliding scale insulin .  Hyponatremia improving felt to be secondary to hyperglycemia chronic steroid use with latest sodium of 132.  Tolerating a regular consistency diet.  Therapy evaluations completed due to patient decreased functional mobility was admitted for a comprehensive rehab program.   Pt reports LBM last night.  Cannot take Gabapentin- because swells up severely in arms and legs- so cannot take lyrica  as well.      Said Norco 7.5 and tramadol  doesn't touch his nerve pain- explained they won't- because nerve pain doesn't improve with opiates.      Review of Systems  Constitutional:  Negative for chills and fever.  HENT:  Negative for hearing loss.   Eyes:  Negative for blurred vision and double vision.  Respiratory:  Negative for cough, shortness of breath and wheezing.   Cardiovascular:  Positive for palpitations. Negative for chest pain, leg swelling and PND.  Gastrointestinal:  Positive for constipation. Negative for heartburn, nausea and vomiting.  Genitourinary:  Negative for dysuria, flank pain, hematuria and urgency.  Musculoskeletal:  Positive for falls, joint pain and myalgias.  Skin:  Negative for rash.  Neurological:  Positive for weakness.  All other systems reviewed and are negative.       Past Medical History:  Diagnosis Date   ETOH abuse     Gout               Past Surgical History:  Procedure Laterality Date   FOOT SURGERY                 Family History  Problem Relation Age of Onset   COPD Father  Social History:  reports that he has been smoking cigarettes. He has never used smokeless tobacco. He reports current alcohol  use of about 42.0 standard drinks of alcohol  per week. He reports that he does not use drugs. Allergies:  Allergies       Allergies  Allergen Reactions   Gabapentin Swelling      Swelling off the legs            Medications Prior to Admission  Medication Sig Dispense Refill    DULoxetine  (CYMBALTA ) 60 MG capsule Take 1 capsule (60 mg total) by mouth daily. 30 capsule 11   fluticasone (FLONASE) 50 MCG/ACT nasal spray Place 1 spray into both nostrils daily.       HYDROcodone -acetaminophen  (NORCO) 7.5-325 MG tablet Take 1 tablet by mouth 3 (three) times daily as needed.       nicotine  (NICODERM CQ  - DOSED IN MG/24 HOURS) 21 mg/24hr patch Place 1 patch (21 mg total) onto the skin daily. 28 patch 1   nortriptyline  (PAMELOR ) 25 MG capsule Take 2 capsules (50 mg total) by mouth at bedtime. 60 capsule 11   Oxcarbazepine  (TRILEPTAL ) 300 MG tablet one po qAM and two po qHS 90 tablet 5   predniSONE  (DELTASONE ) 20 MG tablet Take 4 pills (80 mg) p.o. daily for 6 weeks.  Then take 3 pills p.o. daily x 2 weeks.  Then take 2 pills p.o. daily x 2 weeks.  Then take 1 pill a day until the follow-up visit with your neurologist 120 tablet 3   traMADol  (ULTRAM ) 50 MG tablet Take 1 tablet (50 mg total) by mouth in the morning, at noon, in the evening, and at bedtime. 120 tablet 3   ULORIC  80 MG TABS Take 40 mg by mouth daily.                  Home: Home Living Family/patient expects to be discharged to:: Private residence Living Arrangements: Alone Available Help at Discharge: Family, Available PRN/intermittently Type of Home: House Home Access: Stairs to enter [employer] of Steps: 4 Entrance Stairs-Rails: Right, Left, Can reach both Home Layout: Two level Alternate Level Stairs-Number of Steps: 14 Alternate Level Stairs-Rails: Right Bathroom Shower/Tub: Engineer, manufacturing systems: Standard Bathroom Accessibility: Yes Home Equipment: Agricultural consultant (2 wheels), [employer] - single point Additional Comments: Pt reports living in ranch-poor historian   Functional History: Prior Function Prior Level of Function : Independent/Modified Independent Mobility Comments: Pt reports cane and RW ambulation. ADLs Comments: Reports independence with ADL's.   Functional  Status:  Mobility: Bed Mobility Overal bed mobility: Modified Independent Bed Mobility: Supine to Sit, Sit to Supine Rolling: Max assist Supine to sit: Mod assist Sit to supine: Supervision General bed mobility comments: moderate assist for supine to sit with flat bed level and trunk elevation. Supervision for return to supine from sitting position. Unsteady with BUE, pt given verbal and tactile cues for proper UB placement. Transfers Overall transfer level: Needs assistance Equipment used: Rolling walker (2 wheels) Transfers: Sit to/from Stand, Bed to chair/wheelchair/BSC Sit to Stand: Min assist Bed to/from chair/wheelchair/BSC transfer type:: Step pivot Step pivot transfers: Contact guard assist, Min assist General transfer comment: Min A to boost from EOB and chair. More CGA once standing. Ambulation/Gait Ambulation/Gait assistance: Contact guard assist, Min assist Gait Distance (Feet): 85 Feet Assistive device: Rolling walker (2 wheels) Gait Pattern/deviations: WFL(Within Functional Limits), Step-through pattern, Decreased step length - right, Decreased step length - left, Decreased stance time - right, Decreased stance  time - left, Shuffle General Gait Details: 40ft of ambulation with RW. Unsteady navigating closed environment out to hallway given min assist and cues for RW management.  Once in straight away area, pt ambulating with CGA, unsteady with turning.   ADL: ADL Overall ADL's : Needs assistance/impaired Eating/Feeding: Modified independent, Sitting Grooming: Set up, Sitting Upper Body Bathing: Set up, Sitting Lower Body Bathing: Set up, Sitting/lateral leans Upper Body Dressing : Set up, Sitting Lower Body Dressing: Set up, Sitting/lateral leans Toilet Transfer: Minimal assistance, Contact guard assist, Ambulation, Rolling walker (2 wheels) Toilet Transfer Details (indicate cue type and reason): Simulated via EOB to chair and ambulation in the hall. Toileting-  Clothing Manipulation and Hygiene: Set up, Sitting/lateral lean Tub/ Shower Transfer: Minimal assistance, Contact guard assist, Rolling walker (2 wheels), Ambulation Functional mobility during ADLs: Contact guard assist, Rolling walker (2 wheels) General ADL Comments: Able to ambulate around ICU ~over 100 feet with RW.   Cognition: Cognition Overall Cognitive Status: Within Functional Limits for tasks assessed Orientation Level: Oriented X4 Cognition Arousal: Alert Behavior During Therapy: WFL for tasks assessed/performed Overall Cognitive Status: Within Functional Limits for tasks assessed Area of Impairment: Orientation, Attention Orientation Level: Person, Place, Time, Situation Current Attention Level: Focused   Physical Exam: Blood pressure 121/85, pulse 86, temperature 98 F (36.7 C), temperature source Oral, resp. rate 17, height 6\' 2"  (1.88 m), weight 105.1 kg, SpO2 96%. Physical Exam Vitals and nursing note reviewed.  Constitutional:      Appearance: He is obese.     Comments: Sitting up in bed; just woke up; appropriate, alert, NAD  HENT:     Head: Normocephalic and atraumatic.     Right Ear: External ear normal.     Left Ear: External ear normal.     Nose: Nose normal. No congestion.     Mouth/Throat:     Mouth: Mucous membranes are dry.     Pharynx: Oropharynx is clear. No oropharyngeal exudate.  Eyes:     General:        Right eye: No discharge.        Left eye: No discharge.     Extraocular Movements: Extraocular movements intact.  Cardiovascular:     Rate and Rhythm: Normal rate and regular rhythm.     Heart sounds: No murmur heard.    No gallop.  Pulmonary:     Effort: Pulmonary effort is normal. No respiratory distress.     Breath sounds: Normal breath sounds. No wheezing, rhonchi or rales.  Abdominal:     General: There is no distension.     Tenderness: There is no abdominal tenderness.     Comments: Protuberant- but slightly hypoactive   Musculoskeletal:     Cervical back: Neck supple. No tenderness.     Comments: 5-/5 in Ue's and LE's- throughout all muscles-   Skin:    General: Skin is warm and dry.     Comments: Ecchymoses on arms B/L   Neurological:     Mental Status: He is alert.     Comments: .  Patient is alert.  Follows simple commands.  Makes eye contact with examiner.  Cooperative with exam.  Provides name and age.  Limited with fair medical historian. Decreased to light touch from knees down- but also c/o nerve pain in arms and legs  Psychiatric:     Comments: Very flat        Lab Results Last 48 Hours        Results  for orders placed or performed during the hospital encounter of 08/08/23 (from the past 48 hours)  Glucose, capillary     Status: Abnormal    Collection Time: 08/17/23 11:08 AM  Result Value Ref Range    Glucose-Capillary 164 (H) 70 - 99 mg/dL      Comment: Glucose reference range applies only to samples taken after fasting for at least 8 hours.  Glucose, capillary     Status: Abnormal    Collection Time: 08/17/23  4:57 PM  Result Value Ref Range    Glucose-Capillary 196 (H) 70 - 99 mg/dL      Comment: Glucose reference range applies only to samples taken after fasting for at least 8 hours.  Glucose, capillary     Status: Abnormal    Collection Time: 08/18/23  1:12 AM  Result Value Ref Range    Glucose-Capillary 164 (H) 70 - 99 mg/dL      Comment: Glucose reference range applies only to samples taken after fasting for at least 8 hours.    Comment 1 Notify RN    Basic metabolic panel     Status: Abnormal    Collection Time: 08/18/23  4:15 AM  Result Value Ref Range    Sodium 132 (L) 135 - 145 mmol/L    Potassium 3.7 3.5 - 5.1 mmol/L    Chloride 100 98 - 111 mmol/L    CO2 24 22 - 32 mmol/L    Glucose, Bld 90 70 - 99 mg/dL      Comment: Glucose reference range applies only to samples taken after fasting for at least 8 hours.    BUN 10 6 - 20 mg/dL    Creatinine, Ser 2.95 0.61 - 1.24  mg/dL    Calcium  8.3 (L) 8.9 - 10.3 mg/dL    GFR, Estimated >62 >13 mL/min      Comment: (NOTE) Calculated using the CKD-EPI Creatinine Equation (2021)      Anion gap 8 5 - 15      Comment: Performed at [facility], [address], [place], [place] [address]  Magnesium      Status: Abnormal    Collection Time: 08/18/23  4:15 AM  Result Value Ref Range    Magnesium  1.5 (L) 1.7 - 2.4 mg/dL      Comment: Performed at [facility], [address] [place], [place] [address]  Glucose, capillary     Status: None    Collection Time: 08/18/23  7:56 AM  Result Value Ref Range    Glucose-Capillary 80 70 - 99 mg/dL      Comment: Glucose reference range applies only to samples taken after fasting for at least 8 hours.  Glucose, capillary     Status: Abnormal    Collection Time: 08/18/23 11:26 AM  Result Value Ref Range    Glucose-Capillary 113 (H) 70 - 99 mg/dL      Comment: Glucose reference range applies only to samples taken after fasting for at least 8 hours.  Glucose, capillary     Status: Abnormal    Collection Time: 08/18/23  4:38 PM  Result Value Ref Range    Glucose-Capillary 206 (H) 70 - 99 mg/dL      Comment: Glucose reference range applies only to samples taken after fasting for at least 8 hours.  Glucose, capillary     Status: Abnormal    Collection Time: 08/18/23  9:01 PM  Result Value Ref Range    Glucose-Capillary 111 (H) 70 - 99 mg/dL  Comment: Glucose reference range applies only to samples taken after fasting for at least 8 hours.  Basic metabolic panel     Status: Abnormal    Collection Time: 08/19/23  3:57 AM  Result Value Ref Range    Sodium 135 135 - 145 mmol/L    Potassium 3.9 3.5 - 5.1 mmol/L    Chloride 100 98 - 111 mmol/L    CO2 26 22 - 32 mmol/L    Glucose, Bld 91 70 - 99 mg/dL      Comment: Glucose reference range applies only to samples taken after fasting for at least 8 hours.    BUN 11 6 - 20 mg/dL    Creatinine, Ser 1.61 (L) 0.61 - 1.24 mg/dL     Calcium  8.4 (L) 8.9 - 10.3 mg/dL    GFR, Estimated >09 >60 mL/min      Comment: (NOTE) Calculated using the CKD-EPI Creatinine Equation (2021)      Anion gap 9 5 - 15      Comment: Performed at [facility], [address], [place], [place] [address]  Magnesium      Status: None    Collection Time: 08/19/23  3:57 AM  Result Value Ref Range    Magnesium  2.0 1.7 - 2.4 mg/dL      Comment: Performed at [facility], [address] [place], [place] [address]  Glucose, capillary     Status: None    Collection Time: 08/19/23  7:26 AM  Result Value Ref Range    Glucose-Capillary 94 70 - 99 mg/dL      Comment: Glucose reference range applies only to samples taken after fasting for at least 8 hours.      Imaging Results (Last 48 hours)  No results found.         Blood pressure 121/85, pulse 86, temperature 98 F (36.7 C), temperature source Oral, resp. rate 17, height 6\' 2"  (1.88 m), weight 105.1 kg, SpO2 96%.   Medical Problem List and Plan: 1. 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  Completed 5-day course of IVIG 08/12/2023.  Prednisone  taper as directed.  Follow-up outpatient neurology services Dr. Godwin Lat             -patient may  shower             -ELOS/Goals: ~2 weeks- Mod I             First day of evaluations 2.  Antithrombotics: -DVT/anticoagulation:  Pharmaceutical: Heparin              -antiplatelet therapy: N/A 3. Pain Management: Cymbalta  60 mg daily, tramadol /hydrocodone  as needed             1/15- will increase Cymbalta  to 120 mg daily- monitor Na- and see if helps nerve pain- allergic to gabapentin and Lyrica  due to swelling that was severe.  4. Mood/Behavior/Sleep: Pamelor  50 mg nightly             -antipsychotic agents:  5. Neuropsych/cognition: This patient is capable of making decisions on his own behalf. 6. Skin/Wound Care: Routine skin checks 7. Fluids/Electrolytes/Nutrition: Routine in and outs with follow-up chemistries 8.  Alcohol  abuse/withdrawal.   Patient required Precedex  and phenobarb during hospital stay that is since been discontinued.  Provide counseling             1/15- Thiamine  going to PO route, not IV anymore.  9.  SVT/atrial tachycardia.  Cardizem  120 mg daily.  Cardiac rate controlled.  Follow-up outpatient  cardiology services 10.  New onset diabetes mellitus.  Hemoglobin A1c 8.9.  Tradjenta  5 mg daily.  No current plan for home insulin              1/15- CBGs looking good- con't regimen 11.  Hyponatremia.  Felt to be induced by hyperglycemia.  Follow-up chemistries             1/15 - Na 135 today- doing better 12.  Tobacco abuse.  Provide counseling.  Continue NicoDerm patch 13.  History of seizure disorder.Currently on  Trileptal  300 mg daily and 600 mg nightly.Lamictal  has been discontinued             1/15- pt asking to decrease Trileptal - said it makes him talk "crazy"- and his balance is off. Na 135- so doing better- Will call Dr Godwin Lat and see if can decrease Trileptal  at all? 14.  History of gouty arthritis.  Uloric  40 mg daily     Sterling Eisenmenger, PA-C 08/19/2023     I have personally performed a face to face diagnostic evaluation of this patient and formulated the key components of the plan.  Additionally, I have personally reviewed laboratory data, imaging studies, as well as relevant notes and concur with the physician assistant's documentation above.   The patient's status has not changed from the original H&P.  Any changes in documentation from the acute care chart have been noted above.

## 2023-08-20 NOTE — Progress Notes (Addendum)
 Inpatient Rehabilitation Admission Medication Review by a Pharmacist  A complete drug regimen review was completed for this patient to identify any potential clinically significant medication issues.  High Risk Drug Classes Is patient taking? Indication by Medication  Antipsychotic Yes Trileptal  - neuropathy/sz  Anticoagulant Yes Heparin  - DVT px  Antibiotic No   Opioid Yes Norco/tramadol  - pain  Antiplatelet No   Hypoglycemics/insulin  Yes Linagliptin  - DM  Vasoactive Medication Yes Diltiazem  - SVT  Chemotherapy No   Other Yes Atrovent /albuterol  - COPD Cymbalta /pamelor   - neuropathy Uloric /prednisone  - gout Mag-Ox/KCL/thiamine /folic acid  - supplement Nitcotine patch - tobacco Protonix  - GERD     Type of Medication Issue Identified Description of Issue Recommendation(s)  Drug Interaction(s) (clinically significant)     Duplicate Therapy     Allergy     No Medication Administration End Date     Incorrect Dose     Additional Drug Therapy Needed     Significant med changes from prior encounter (inform family/care partners about these prior to discharge).    Other  Flonase Resume as needed in CIR or at dc    Clinically significant medication issues were identified that warrant physician communication and completion of prescribed/recommended actions by midnight of the next day:  No  Name of provider notified for urgent issues identified:   Provider Method of Notification:     Pharmacist comments:   Time spent performing this drug regimen review (minutes):  20   Ivery Marking, PharmD, [place], AAHIVP, CPP Infectious Disease Pharmacist 08/19/2023 12:26 PM

## 2023-08-20 NOTE — Plan of Care (Signed)
  Problem: Consults Goal: RH GENERAL PATIENT EDUCATION Description: See Patient Education module for education specifics. Outcome: Progressing   Problem: RH SAFETY Goal: RH STG ADHERE TO SAFETY PRECAUTIONS W/ASSISTANCE/DEVICE Description: STG Adhere to Safety Precautions With cues Outcome: Progressing   Problem: RH PAIN MANAGEMENT Goal: RH STG PAIN MANAGED AT OR BELOW PT'S PAIN GOAL Description:  <4 w/ prns Outcome: Progressing   Problem: RH KNOWLEDGE DEFICIT GENERAL Goal: RH STG INCREASE KNOWLEDGE OF SELF CARE AFTER HOSPITALIZATION Description: Pt will be  able to manage care using educational resources for medications and diet independently Outcome: Progressing

## 2023-08-20 NOTE — Progress Notes (Signed)
 PHARMACIST - PHYSICIAN COMMUNICATION  DR:   Raynaldo Call  CONCERNING: IV to Oral Route Change Policy  RECOMMENDATION: This patient is receiving thiamine  by the intravenous route.  Based on criteria approved by the Pharmacy and Therapeutics Committee, the intravenous medication(s) is/are being converted to the equivalent oral dose form(s).   DESCRIPTION: These criteria include: The patient is eating (either orally or via tube) and/or has been taking other orally administered medications for a least 24 hours The patient has no evidence of active gastrointestinal bleeding or impaired GI absorption (gastrectomy, short bowel, patient on TNA or NPO).  If you have questions about this conversion, please contact the Pharmacy Department  []   [phone] )  [facility] Done []   [phone] )  [facility] [x]   [phone] )  [facility] []   [phone] )  [facility] []   [phone] )  [facility]   [place], PharmD, [place], AAHIVP, CPP Infectious Disease Pharmacist 08/20/2023 7:14 AM

## 2023-08-20 NOTE — Discharge Instructions (Addendum)
 Inpatient Rehab Discharge Instructions  Connor Copeland Discharge date and time: No discharge date for patient encounter.   Activities/Precautions/ Functional Status: Activity: activity as tolerated Diet: diabetic diet Wound Care: Routine skin checks Functional status:  ___ No restrictions     ___ Walk up steps independently ___ 24/7 supervision/assistance   ___ Walk up steps with assistance ___ Intermittent supervision/assistance  ___ Bathe/dress independently ___ Walk with walker     _x__ Bathe/dress with assistance ___ Walk Independently    ___ Shower independently ___ Walk with assistance    ___ Shower with assistance ___ No alcohol     ___ Return to work/school ________  Special Instructions: No driving smoking or alcohol   COMMUNITY REFERRALS UPON DISCHARGE:   Outpatient: PT     OT                 Agency:[facility]  Phone:[phone]              Appointment Date/Time:*Please expect follow-up within 7-10 business days to schedule your appointment. If you have not received follow-up, be sure to contact the site directly.*   Medical Equipment/Items Ordered: rollator                                                 Agency/Supplier: [facility] [phone]    My questions have been answered and I understand these instructions. I will adhere to these goals and the provided educational materials after my discharge from the hospital.  Patient/Caregiver Signature _______________________________ Date __________  Clinician Signature _______________________________________ Date __________  Please bring this form and your medication list with you to all your follow-up doctor's appointments.

## 2023-08-20 NOTE — Evaluation (Signed)
 Occupational Therapy Assessment and Plan  Patient Details  Name: Connor Copeland MRN: [id number] Date of Birth: June 20, 1976  OT Diagnosis: muscle weakness (generalized) Rehab Potential: Rehab Potential (ACUTE ONLY): Good ELOS: ~5- 7 days   Today's Date: 08/20/2023 OT Individual Time: 4098-1191 OT Individual Time Calculation (min): 70 min     Hospital Problem: Principal Problem:   CIDP (chronic inflammatory demyelinating polyneuropathy) (HCC)   Past Medical History:  Past Medical History:  Diagnosis Date   ETOH abuse    Gout    Past Surgical History:  Past Surgical History:  Procedure Laterality Date   FOOT SURGERY      Assessment & Plan Clinical Impression: Patient is a 48 y.o. year old male right-handed male with history of CIDP (chronic demyelinating polyneuropathy) followed by neurology services Dr. Godwin Lat as well as history of chronic alcohol  use diorder, gouty arthritis maintained on Uloric , seizure disorder maintained on Trileptal , chronic neuropathic pain, tobacco use. Per chart review patient lives alone. Two-level home/used a cane/rolling walker for ambulation. Reports independence with ADLs. His mother and family check on him as needed. Presented 08/08/2023 to [facility] with progressive weakness increasing falls and unable to carry out his ADLs. Admission chemistries unremarkable except urine drug screen positive for marijuana, sodium 125, potassium 2.6, glucose 559, hemoglobin A1c 8.9, osmolality 360,ammonia 24. He was seen in consultation by neurology services suspect CIDP exacerbation placed on IVIG x 5 days completing 08/12/2023. Hospital course complicated by alcohol  withdrawal he was initially in the ICU maintained on CIWA protocol and started on Precedex  drip as well as phenobarbital . Cardiology services consulted Dr. Armida Lander 08/14/2023 for evaluation of SVT and atrial tachycardia started on short acting Cardizem . Echocardiogram ejection fraction of 60  to 65% no wall motion abnormalities. There was mild left ventricular hypertrophy as well as grade 1 diastolic dysfunction. His Cardizem  was transition to Cardizem  CD 120 mg daily 08/15/2023 with cardiac rate controlled. Subcutaneous heparin  initiated for DVT prophylaxis. His overall strength and mobility continues to improve he is being tapered off of prednisone . Blood sugars monitored was no previous history of diabetes and monitoring of blood sugars with taper of prednisone  findings of noted elevated hemoglobin A1c of 8.9 with Tradjenta  initiated 08/13/2023 with sliding scale insulin . Hyponatremia improving felt to be secondary to hyperglycemia chronic steroid use with latest sodium of 132. Tolerating a regular consistency diet.   Patient transferred to CIR on 08/19/2023 .    Patient currently requires supervision with basic self-care skills and contact guard for dynamic mobility   secondary to muscle weakness and impacted by numbness in bilateral feet, decreased cardiorespiratoy endurance, and decreased standing balance and decreased balance strategies.  Prior to hospitalization, patient could complete ADL with independent .  Patient will benefit from skilled intervention to decrease level of assist with basic self-care skills and increase independence with basic self-care skills prior to discharge home with care partner.  Anticipate patient will require intermittent supervision and follow up home health.  OT - End of Session Activity Tolerance: Tolerates < 10 min activity with changes in vital signs Endurance Deficit: Yes OT Assessment Rehab Potential (ACUTE ONLY): Good OT Patient demonstrates impairments in the following area(s): Balance;Pain;Endurance;Motor;Sensory OT Basic ADL's Functional Problem(s): Bathing;Dressing;Toileting OT Transfers Functional Problem(s): Toilet;Tub/Shower OT Additional Impairment(s): None OT Plan OT Intensity: Minimum of 1-2 x/day, 45 to 90 minutes OT Frequency: 5 out of  7 days OT Duration/Estimated Length of Stay: ~5- 7 days OT Treatment/Interventions: Balance/vestibular training;Community reintegration;Disease mangement/prevention;Functional electrical stimulation;Neuromuscular re-education;Patient/family  education;Self Care/advanced ADL retraining;Therapeutic Exercise;UE/LE Coordination activities;Discharge planning;DME/adaptive equipment instruction;Functional mobility training;Pain management;Psychosocial support;Skin care/wound managment;Therapeutic Activities;UE/LE Strength taining/ROM OT Self Feeding Anticipated Outcome(s): n/a OT Basic Self-Care Anticipated Outcome(s): mod I - I OT Toileting Anticipated Outcome(s): mod I - I OT Bathroom Transfers Anticipated Outcome(s): mod I - I OT Recommendation Recommendations for Other Services: Neuropsych consult Patient destination: Home Follow Up Recommendations: Home health OT Equipment Recommended: To be determined   OT Evaluation Precautions/Restrictions  Precautions Precautions: Fall Precaution Comments: painful neuropathy in arms and from waist down Restrictions Weight Bearing Restrictions Per Provider Order: No General Chart Reviewed: Yes Family/Caregiver Present: No Vital Signs   Pain Pain Assessment Pain Scale: 0-10 Pain Score: 9  Pain Type: Acute pain Pain Location: Arm Pain Orientation: Left;Right Pain Descriptors / Indicators: Aching;Constant Pain Frequency: Occasional Pain Onset: On-going Patients Stated Pain Goal: 0 Pain Intervention(s): Medication (See eMAR) Multiple Pain Sites: No Home Living/Prior Functioning Home Living Family/patient expects to be discharged to:: Private residence Living Arrangements: Alone Available Help at Discharge: Family, Available PRN/intermittently Type of Home: House Home Access: Stairs to enter [employer] of Steps: 2 in front, 6 at back Entrance Stairs-Rails: Right Home Layout: One level, Laundry or work area in basement Alternate  Teacher, music of Steps: 14 Bathroom Shower/Tub: Health visitor: Pharmacist, community: Yes Additional Comments: Pt reports living in ranch-stye home  Lives With: Alone Prior Function Level of Independence: Requires assistive device for independence, Independent with homemaking with ambulation, Independent with basic ADLs, Independent with gait, Independent with transfers  Able to Take Stairs?: Yes Driving: No Vision Baseline Vision/History: 0 No visual deficits Ability to See in Adequate Light: 1 Impaired Patient Visual Report: No change from baseline Vision Assessment?: No apparent visual deficits Additional Comments: reports that over time his near sight has declined and needs readers but doesnt have any Perception  Perception: Within Functional Limits Praxis Praxis: WFL Cognition Cognition Overall Cognitive Status: Within Functional Limits for tasks assessed Arousal/Alertness: Awake/alert Orientation Level: Place;Person;Situation Person: Oriented Place: Oriented Situation: Oriented Memory: Appears intact Attention: Focused;Sustained;Selective Focused Attention: Appears intact Sustained Attention: Appears intact Selective Attention: Appears intact Awareness: Appears intact Problem Solving: Appears intact Safety/Judgment: Appears intact Brief Interview for Mental Status (BIMS) Repetition of Three Words (First Attempt): 3 Temporal Orientation: Year: Correct Temporal Orientation: Month: Accurate within 5 days Temporal Orientation: Day: Correct Recall: "Sock": Yes, no cue required Recall: "Blue": Yes, no cue required Recall: "Bed": Yes, no cue required BIMS Summary Score: 15 Sensation Sensation Light Touch: Impaired Detail Light Touch Impaired Details: Impaired RUE;Impaired LUE Proprioception: Impaired Detail Proprioception Impaired Details: Impaired RLE;Impaired LLE Coordination Gross Motor Movements are Fluid and Coordinated:  Yes Fine Motor Movements are Fluid and Coordinated: No Motor  Motor Motor - Skilled Clinical Observations: generalized debility with elevated HR with activity  Trunk/Postural Assessment  Cervical Assessment Cervical Assessment: Within Functional Limits Thoracic Assessment Thoracic Assessment:  (rounded shoulders) Postural Control Postural Control: Within Functional Limits  Balance Balance Balance Assessed: Yes Standardized Balance Assessment Standardized Balance Assessment: Berg Balance Test Berg Balance Test Sit to Stand: Able to stand  independently using hands Standing Unsupported: Able to stand safely 2 minutes Sitting with Back Unsupported but Feet Supported on Floor or Stool: Able to sit safely and securely 2 minutes Stand to Sit: Sits safely with minimal use of hands Transfers: Able to transfer safely, definite need of hands Standing Unsupported with Eyes Closed: Able to stand 10 seconds with supervision Standing Ubsupported with Feet Together: Able to  place feet together independently and stand for 1 minute with supervision From Standing, Reach Forward with Outstretched Arm: Can reach forward >5 cm safely (2") From Standing Position, Pick up Object from Floor: Able to pick up shoe, needs supervision Standing Unsupported, Alternately Place Feet on Step/Stool: Able to complete 4 steps without aid or supervision Standing Unsupported, One Foot in Front: Able to plae foot ahead of the other independently and hold 30 seconds Static Sitting Balance Static Sitting - Balance Support: Feet supported Static Sitting - Level of Assistance: 7: Independent Dynamic Sitting Balance Dynamic Sitting - Balance Support: Feet supported;During functional activity Dynamic Sitting - Level of Assistance: 5: Stand by assistance;6: Modified independent (Device/Increase time) Static Standing Balance Static Standing - Balance Support: During functional activity Static Standing - Level of Assistance:  5: Stand by assistance (to CGA) Dynamic Standing Balance Dynamic Standing - Balance Support: During functional activity Dynamic Standing - Level of Assistance: Other (comment) (CGA) Extremity/Trunk Assessment RUE Assessment RUE Assessment: Within Functional Limits LUE Assessment LUE Assessment: Within Functional Limits  Care Tool Care Tool Self Care Eating   Eating Assist Level: Independent    Oral Care    Oral Care Assist Level: Set up assist    Bathing   Body parts bathed by patient: Right arm;Left upper leg;Right lower leg;Left arm;Chest;Left lower leg;Abdomen;Front perineal area;Buttocks;Right upper leg;Face     Assist Level: Supervision/Verbal cueing    Upper Body Dressing(including orthotics)   What is the patient wearing?: Pull over shirt   Assist Level: Set up assist    Lower Body Dressing (excluding footwear)   What is the patient wearing?: Pants;Underwear/pull up Assist for lower body dressing: Set up assist    Putting on/Taking off footwear   What is the patient wearing?: Socks;Shoes;Ted hose Assist for footwear: Minimal Assistance - Patient > 75%       Care Tool Toileting Toileting activity   Assist for toileting: Supervision/Verbal cueing     Care Tool Bed Mobility Roll left and right activity   Roll left and right assist level: Independent with assistive device    Sit to lying activity   Sit to lying assist level: Independent with assistive device    Lying to sitting on side of bed activity   Lying to sitting on side of bed assist level: the ability to move from lying on the back to sitting on the side of the bed with no back support.: Supervision/Verbal cueing     Care Tool Transfers Sit to stand transfer   Sit to stand assist level: Supervision/Verbal cueing    Chair/bed transfer   Chair/bed transfer assist level: Supervision/Verbal cueing     Toilet transfer   Assist Level: Supervision/Verbal cueing     Care Tool  Cognition  Expression of Ideas and Wants Expression of Ideas and Wants: 4. Without difficulty (complex and basic) - expresses complex messages without difficulty and with speech that is clear and easy to understand  Understanding Verbal and Non-Verbal Content Understanding Verbal and Non-Verbal Content: 4. Understands (complex and basic) - clear comprehension without cues or repetitions   Memory/Recall Ability     Refer to Care Plan for Long Term Goals  SHORT TERM GOAL WEEK 1 OT Short Term Goal 1 (Week 1): STG=LTG  Recommendations for other services: Neuropsych   Skilled Therapeutic Intervention OT eval initiated with OT purpose, role and goals discussed. Pt performed functional ambulation around the room without a AD with supervision/ contact guard. Pt is private during self care  tasks and performed showering and dressing in the bathroom with distant supervision with cues to sit to thread pants. Pt also sat to shower (shaved and brushed teeth in shower as well. Pt extremely tired after shower with HR in 120s. He needed to lay down to rest in bed afterwards- unable to continue until a laying rest break. Donned pt's TEDS. Pt ambulated from room to main gym ~ 150 feet and reported he was a 19 on the BORG scale and needed prolonged rest break - HR still elevated. Pt performed stair climbing and descending with hand rails with CGA. After another prolonged rest break ambulated with CGA with decreased ability to clear left foot on foot with walking. Pt left resting in the bed.  HR still elevated with all activities.   ADL ADL Eating: Independent Where Assessed-Eating:  (showre) Grooming: Modified independent Where Assessed-Grooming: Other (comment) Upper Body Bathing: Supervision/safety Where Assessed-Upper Body Bathing: Shower Lower Body Bathing: Supervision/safety Where Assessed-Lower Body Bathing: Shower Upper Body Dressing: Modified independent (Device) Where Assessed-Upper Body Dressing:  Chair Lower Body Dressing: Supervision/safety Where Assessed-Lower Body Dressing: Chair Toileting: Supervision/safety Toilet Transfer: Distant supervision Film/video editor: Close supervision Mobility  Bed Mobility Bed Mobility: Rolling Right;Rolling Left Rolling Right: Independent with assistive device Rolling Left: Independent with assistive device Transfers Sit to Stand: Supervision/Verbal cueing Stand to Sit: Supervision/Verbal cueing   Discharge Criteria: Patient will be discharged from OT if patient refuses treatment 3 consecutive times without medical reason, if treatment goals not met, if there is a change in medical status, if patient makes no progress towards goals or if patient is discharged from hospital.  The above assessment, treatment plan, treatment alternatives and goals were discussed and mutually agreed upon: by patient  Oran Bimler 08/20/2023, 2:48 PM

## 2023-08-20 NOTE — Progress Notes (Signed)
 Physical Therapy Session Note  Patient Details  Name: Connor Copeland MRN: [id number] Date of Birth: 09/09/1975  Today's Date: 08/20/2023 PT Individual Time: 4098-1191 PT Individual Time Calculation (min): 70 min   Short Term Goals: Week 1:  PT Short Term Goal 1 (Week 1): STG = LTG d/t ELOS  Skilled Therapeutic Interventions/Progress Updates: Pt presented in bed agreeable to therapy. Pt states pain 9/10, asking for pain meds, nsg notified and meds received at end of session. Pt verbalized noted fatigue more so that usual. Provided education on increase in effort that's required during the transition to IPR. Monitored pt's HR throughout session with pt at low 110s at rest initially, increased to high 110s prior to ambulation then maintained low 120s throughout session including with activity. Pt completed bed mobility with supervision and ambulated to main gym without AD and with supervision. Pt indicated 9/10 exertion on mBORG after ambulation. Pt participated in Sit to stand with weighted ball in sets of 5. Pt initially able to complete set of 5 then progressively required increased breaks due to decreased endurance with pt able to complete sets of 2-3. Pt with max HR 123 but required extended seated rest breaks between bouts. Pt then ambulated to ortho gym in same manner as prior and set up with NuStep. Pt completed L2 x 6 min averaging 50-60 SPM. Pt then ambulated back to room supervision level at end of session and transitioned to bed with supervision overall. Pt left in bed at end of session with bed alarm on, call bell within reach and needs met.    Therapy Documentation Precautions:  Precautions Precautions: Fall Precaution Comments: painful neuropathy in arms and from waist down Restrictions Weight Bearing Restrictions Per Provider Order: No General:   Vital Signs: Therapy Vitals Temp: 98.8 F (37.1 C) Temp Source: Oral Pulse Rate: (!) 109 Resp: 18 BP: 127/89 Patient  Position (if appropriate): Lying Oxygen Therapy SpO2: 98 % O2 Device: Room Air Pain: Pain Assessment Pain Scale: 0-10 Pain Score: 3  Pain Type: Acute pain Pain Location: Arm Pain Orientation: Left;Right Pain Descriptors / Indicators: Aching;Constant Pain Frequency: Occasional Pain Onset: On-going Patients Stated Pain Goal: 0 Pain Intervention(s): Medication (See eMAR) Multiple Pain Sites: No Mobility: Bed Mobility Bed Mobility: Rolling Right;Rolling Left Rolling Right: Independent with assistive device Rolling Left: Independent with assistive device Transfers Transfers: Stand Pivot Transfers;Sit to Stand;Stand to Sit Sit to Stand: Supervision/Verbal cueing Stand to Sit: Supervision/Verbal cueing Stand Pivot Transfers: Supervision/Verbal cueing Locomotion :    Trunk/Postural Assessment : Cervical Assessment Cervical Assessment: Within Functional Limits Thoracic Assessment Thoracic Assessment:  (rounded shoulders) Postural Control Postural Control: Within Functional Limits  Balance: Balance Balance Assessed: Yes Standardized Balance Assessment Standardized Balance Assessment: Berg Balance Test Berg Balance Test Sit to Stand: Able to stand  independently using hands Standing Unsupported: Able to stand safely 2 minutes Sitting with Back Unsupported but Feet Supported on Floor or Stool: Able to sit safely and securely 2 minutes Stand to Sit: Sits safely with minimal use of hands Transfers: Able to transfer safely, definite need of hands Standing Unsupported with Eyes Closed: Able to stand 10 seconds with supervision Standing Ubsupported with Feet Together: Able to place feet together independently and stand for 1 minute with supervision From Standing, Reach Forward with Outstretched Arm: Can reach forward >5 cm safely (2") From Standing Position, Pick up Object from Floor: Able to pick up shoe, needs supervision Standing Unsupported, Alternately Place Feet on Step/Stool:  Able to complete 4 steps  without aid or supervision Standing Unsupported, One Foot in Front: Able to plae foot ahead of the other independently and hold 30 seconds Static Sitting Balance Static Sitting - Balance Support: Feet supported Static Sitting - Level of Assistance: 7: Independent Dynamic Sitting Balance Dynamic Sitting - Balance Support: Feet supported;During functional activity Dynamic Sitting - Level of Assistance: 5: Stand by assistance;6: Modified independent (Device/Increase time) Static Standing Balance Static Standing - Balance Support: During functional activity Static Standing - Level of Assistance: 5: Stand by assistance (to CGA) Dynamic Standing Balance Dynamic Standing - Balance Support: During functional activity Dynamic Standing - Level of Assistance: Other (comment) (CGA) Exercises:   Other Treatments:      Therapy/Group: Individual Therapy  Finlee Milo 08/20/2023, 4:43 PM

## 2023-08-20 NOTE — Plan of Care (Signed)
  Problem: RH SAFETY Goal: RH STG ADHERE TO SAFETY PRECAUTIONS W/ASSISTANCE/DEVICE Description: STG Adhere to Safety Precautions With cues Outcome: Progressing   Problem: RH PAIN MANAGEMENT Goal: RH STG PAIN MANAGED AT OR BELOW PT'S PAIN GOAL Description:  <4 w/ prns Outcome: Progressing   Problem: RH KNOWLEDGE DEFICIT GENERAL Goal: RH STG INCREASE KNOWLEDGE OF SELF CARE AFTER HOSPITALIZATION Description: Pt will be  able to manage care using educational resources for medications and diet independently Outcome: Progressing

## 2023-08-20 NOTE — Discharge Summary (Signed)
Physician Discharge Summary  Patient ID: BILLIE LUEBBERS MRN: [id number] DOB/AGE: 1975/12/01 48 y.o.  Admit date: 08/19/2023 Discharge date: 08/26/2023  Discharge Diagnoses:  Principal Problem:   CIDP (chronic inflammatory demyelinating polyneuropathy) (HCC) DVT prophylaxis Mood stabilization Alcohol use SVT/atrial tachycardia New onset diabetes mellitus Hyponatremia Tobacco use History of seizure disorder Gouty arthritis  Discharged Condition: Stable  Significant Diagnostic Studies: ECHOCARDIOGRAM COMPLETE Result Date: 08/14/2023    ECHOCARDIOGRAM REPORT   Patient Name:   BENZ CHANCE Date of Exam: 08/14/2023 Medical Rec #:  [id number]              Height:       74.0 in Accession #:    [id number]             Weight:       242.9 lb Date of Birth:  05-21-1976              BSA:          2.361 m Patient Age:    47 years               BP:           117/71 mmHg Patient Gender: M                      HR:           100 bpm. Exam [name] Procedure: 2D Echo, Cardiac Doppler and Color Doppler Indications:    SVT (supraventricular tachycardia) (HCC)  History:        Patient has prior history of Echocardiogram examinations, most                 recent 01/22/2023. ETOH abuse; Risk Factors:Current Smoker and                 Dyslipidemia.  Sonographer:    Dondra Prader RVT RCS Referring Phys: [phone] DAVID TAT  Sonographer Comments: Technically difficult study due to poor echo windows, Technically challenging study due to limited acoustic windows, suboptimal parasternal window, suboptimal apical window, suboptimal subcostal window and patient is obese. Image acquisition challenging due to uncooperative patient. IMPRESSIONS  1. Left ventricular ejection fraction, by estimation, is 60 to 65%. The left ventricle has normal function. The left ventricle has no regional wall motion abnormalities. There is mild left ventricular hypertrophy. Left ventricular diastolic parameters are consistent  with Grade I diastolic dysfunction (impaired relaxation).  2. Right ventricular systolic function is normal. The right ventricular size is normal.  3. The mitral valve is normal in structure. No evidence of mitral valve regurgitation. No evidence of mitral stenosis.  4. The aortic valve is tricuspid. Aortic valve regurgitation is not visualized. No aortic stenosis is present. FINDINGS  Left Ventricle: Left ventricular ejection fraction, by estimation, is 60 to 65%. The left ventricle has normal function. The left ventricle has no regional wall motion abnormalities. The left ventricular internal cavity size was normal in size. There is  mild left ventricular hypertrophy. Left ventricular diastolic parameters are consistent with Grade I diastolic dysfunction (impaired relaxation). Normal left ventricular filling pressure. Right Ventricle: The right ventricular size is normal. Right vetricular wall thickness was not well visualized. Right ventricular systolic function is normal. Left Atrium: Left atrial size was normal in size. Right Atrium: Right atrial size was normal in size. Pericardium: There is no evidence of pericardial effusion. Mitral Valve: The mitral valve is normal in structure. No evidence of mitral  valve regurgitation. No evidence of mitral valve stenosis. Tricuspid Valve: The tricuspid valve is normal in structure. Tricuspid valve regurgitation is not demonstrated. No evidence of tricuspid stenosis. Aortic Valve: The aortic valve is tricuspid. Aortic valve regurgitation is not visualized. No aortic stenosis is present. Aortic valve mean gradient measures 2.0 mmHg. Aortic valve peak gradient measures 4.4 mmHg. Aortic valve area, by VTI measures 3.08 cm. Pulmonic Valve: The pulmonic valve was not well visualized. Pulmonic valve regurgitation is not visualized. No evidence of pulmonic stenosis. Aorta: The aortic root is normal in size and structure. IAS/Shunts: The interatrial septum was not well  visualized.  LEFT VENTRICLE PLAX 2D LVIDd:         4.30 cm   Diastology LVIDs:         3.00 cm   LV e' medial:    6.53 cm/s LV PW:         1.20 cm   LV E/e' medial:  8.7 LV IVS:        1.30 cm   LV e' lateral:   7.18 cm/s LVOT diam:     2.10 cm   LV E/e' lateral: 7.9 LV SV:         48 LV SV Index:   20 LVOT Area:     3.46 cm  RIGHT VENTRICLE RV S prime:     14.50 cm/s LEFT ATRIUM             Index LA diam:        3.20 cm 1.36 cm/m LA Vol (A2C):   41.7 ml 17.66 ml/m LA Vol (A4C):   46.3 ml 19.61 ml/m LA Biplane Vol: 44.7 ml 18.93 ml/m  AORTIC VALVE                    PULMONIC VALVE AV Area (Vmax):    3.05 cm     PV Vmax:       0.92 m/s AV Area (Vmean):   3.03 cm     PV Peak grad:  3.4 mmHg AV Area (VTI):     3.08 cm AV Vmax:           105.00 cm/s AV Vmean:          70.600 cm/s AV VTI:            0.155 m AV Peak Grad:      4.4 mmHg AV Mean Grad:      2.0 mmHg LVOT Vmax:         92.40 cm/s LVOT Vmean:        61.800 cm/s LVOT VTI:          0.138 m LVOT/AV VTI ratio: 0.89  AORTA Ao Root diam: 3.80 cm Ao Asc diam:  3.50 cm MITRAL VALVE MV Area (PHT): 4.60 cm    SHUNTS MV Decel Time: 165 msec    Systemic VTI:  0.14 m MV E velocity: 56.90 cm/s  Systemic Diam: 2.10 cm MV A velocity: 88.10 cm/s MV E/A ratio:  0.65 Dina Rich MD Electronically signed by Dina Rich MD Signature Date/Time: 08/14/2023/12:31:06 PM    Final    CT HEAD WO CONTRAST ( ) Result Date: 08/10/2023 CLINICAL DATA:  Altered mental status, nontraumatic (Ped 0-17y). EXAM: CT HEAD WITHOUT CONTRAST TECHNIQUE: Contiguous axial images were obtained from the base of the skull through the vertex without intravenous contrast. RADIATION DOSE REDUCTION: This exam was performed according to the departmental dose-optimization program which includes automated exposure control, adjustment  of the mA and/or kV according to patient size and/or use of iterative reconstruction technique. COMPARISON:  CT head June 25, 24. FINDINGS: Brain: No evidence of  acute infarction, hemorrhage, hydrocephalus, extra-axial collection or mass lesion/mass effect. Vascular: No hyperdense vessel or unexpected calcification. Skull: No acute fracture. Sinuses/Orbits: Clear sinuses.  No acute orbital findings. Other: No mastoid effusions. IMPRESSION: Stable head CT.  No evidence of acute intracranial abnormality. Electronically Signed   By: Feliberto Harts M.D.   On: 08/10/2023 02:21    Labs:  Basic Metabolic Panel: Recent Labs  Lab 08/19/23 1841 08/20/23 0518  NA  --  135  K  --  4.1  CL  --  102  CO2  --  24  GLUCOSE  --  107*  BUN  --  9  CREATININE 0.76 0.73  CALCIUM  --  8.5*    CBC: Recent Labs  Lab 08/19/23 1841 08/20/23 0518  WBC 10.4 8.8  NEUTROABS  --  4.6  HGB 14.0 12.3*  HCT 42.9 36.6*  MCV 94.1 93.6  PLT 714* 604*    CBG: Recent Labs  Lab 08/24/23 2042 08/25/23 0611 08/25/23 1213 08/25/23 1713 08/25/23 2110  GLUCAP 117* 102* 129* 150* 188*    Brief HPI:   LONZA METHOT is a 48 y.o. right-handed male with history significant for CIDP followed by neurology services Dr. Epimenio Foot as well as history of chronic alcohol use, gouty arthritis maintained on Uloric, seizure disorder maintained on Trileptal, chronic neuropathic pain, tobacco use.  Per chart review lives alone.  Two-level home used a cane and rolling walker for ambulation.  He has a mother and family who check on him as needed.  Presented to [facility] 08/08/2023 with progressive weakness and increasing falls and unable to carry out his ADLs.  Admission chemistries unremarkable except urine drug screen positive marijuana sodium 125 potassium 2.6 glucose 559 hemoglobin A1c 8.9 osmolality 360 ammonia level 24.  He was seen in consultation by neurology services suspect CIDP exacerbation placed on IVIG x 5 days completing 08/12/2023.  Hospital course complicated by alcohol withdrawal initially in the ICU maintain on CIWA protocol.  Cardiology services consulted Dr.  Dina Rich 08/14/2023 for evaluation of SVT and atrial tachycardia started on short acting Cardizem.  Echocardiogram ejection fraction of 60 to 65% no wall motion abnormalities.  There was mild left ventricular hypertrophy as well as grade 1 diastolic dysfunction.  He was placed on Cardizem transition to Cardizem CD 120 mg daily 08/15/2023 with cardiac rate controlled.  Subcutaneous heparin added for DVT prophylaxis.  His overall strength and mobility continue to improve he was tapered off prednisone.  Blood sugars monitored no previous history of diabetes and monitored closely with tapering of steroid.  Tradjenta was initiated 08/13/2023 with sliding scale insulin.  Hyponatremia improved felt to be secondary to hyperglycemia chronic steroid use with latest sodium 132.  Therapy evaluations completed due to patient decreased functional mobility was admitted for a comprehensive rehab program.   Hospital Course: AYO DEJARDIN was admitted to rehab 08/19/2023 for inpatient therapies to consist of PT, ST and OT at least three hours five days a week. Past admission physiatrist, therapy team and rehab RN have worked together to provide customized collaborative inpatient rehab.  Pertaining to patient's CIPD exacerbation completed 5-day course IVIG 08/12/2023 prednisone taper as directed.  Follow-up outpatient neurology service Dr. Epimenio Foot.  Subcutaneous heparin for DVT prophylaxis no bleeding episodes.  Pain management with use of Cymbalta schedule  tramadol hydrocodone as needed.  Mood stabilization with Pamelor at nighttime and good results.  He did have a history of alcohol use CIWA protocol initiated early in hospital stay patient received Cigar cessation of alcohol.  Also history of tobacco use again receiving counseling guard cessation of nicotine products maintained on a NicoDerm patch.  SVT/atrial tachycardia Cardizem 120 mg daily cardiac rate controlled no chest pain or shortness of breath follow-up  outpatient services cardiology service.  Blood pressure stable on low-dose Norvasc.  New onset diabetes mellitus hemoglobin A1c 8.9 maintained on Tradjenta and no current plan for insulin therapy diabetic teaching monitor closely with tapering of steroid.  Hyponatremia continued to improve follow-up chemistries routine.  History of seizure disorder maintained on Trileptal.  No seizure activity.   Blood pressures were monitored on TID basis and controlled  Diabetes has been monitored with ac/hs CBG checks and SSI was use prn for tighter BS control.    Rehab course: During patient's stay in rehab weekly team conferences were held to monitor patient's progress, set goals and discuss barriers to discharge. At admission, patient required minimal assist step pivot transfers minimal assist 85 feet rolling walker  Physical exam.  Blood pressure 121/85 pulse 86 temperature 98 respiration 17 oxygen saturation 96% room air Constitutional.  No acute distress HEENT Head.  Normocephalic and atraumatic Eyes.  Pupils round and reactive to light no discharge without nystagmus Neck.  Supple nontender no JVD without thyromegaly Cardiac regular rate and rhythm without any extra sounds or murmur heard Abdomen.  Soft nontender positive bowel sounds without rebound Respiratory effort normal no respiratory distress without wheeze Neurologic.  Alert oriented x 3 follows commands.  Cooperative with exam.  He/She  has had improvement in activity tolerance, balance, postural control as well as ability to compensate for deficits. He/She has had improvement in functional use RUE/LUE  and RLE/LLE as well as improvement in awareness.  Patient ambulates to the Ortho gym with contact-guard assist.  Ambulates back to his room with supervision level.  Working with energy conservation techniques.  Independent static sitting.  Contact-guard for dynamic standing.  Requires supervision for basic self-care skills and contact-guard for  dynamic mobility.  Full teaching completed plan discharge to home       Disposition:  Discharge disposition: 01-Home or Self Care        Diet: Diabetic diet  Special Instructions: No driving smoking or alcohol  Medications at discharge. 1.  Tylenol as needed 2.  Cardizem CD 120 mg daily 3.  Cymbalta 120 mg p.o. daily 4.  Uloric 40 mg p.o. daily 5.  Folic acid 1 mg p.o. daily 6.  Tradjenta 5 mg p.o. daily 7.  Magnesium oxide 400 mg p.o. twice daily 8.  Multivitamin daily 9.  NicoDerm patch taper as directed 10.  Pamelor 50 mg p.o. nightly 11.  Trileptal 300 mg daily and 600 mg nightly 12.  Protonix 40 mg p.o. daily 13.  Thiamine 100 mg daily 14.  Norco 7.5-325 mg 1 tablet 3 times daily as needed 15.  Norvasc 2.5 mg daily 16.  Flonase 1 spray into both nostrils daily    30-35 minutes were spent completing discharge summary and discharge planning     Follow-up Information     Lovorn, Aundra Millet, MD Follow up.   Specialty: Physical Medicine and Rehabilitation Why: No formal follow up needed Contact information: [address] [place] [address] [phone]         Despina Arias  A, MD Follow up.   Specialty: Neurology Why: Call for appointment Contact information: [address] [place] [address] [phone]         Mallipeddi, Orion Modest, MD Follow up.   Specialties: Cardiology, Internal Medicine Why: Call for appointment Contact information: [address] [place] [address] [phone]                 Signed: Charlton Amor 08/26/2023, 5:33 AM

## 2023-08-20 NOTE — Evaluation (Signed)
 Physical Therapy Assessment and Plan  Patient Details  Name: Connor Copeland MRN: [id number] Date of Birth: 20-Oct-1975  PT Diagnosis: Difficulty walking, Dizziness and giddiness, Impaired sensation, and Muscle weakness Rehab Potential: Good ELOS: 7-10 days   Today's Date: 08/20/2023 PT Individual Time: 0901-1000 PT Individual Time Calculation (min): 59 min    Hospital Problem: Principal Problem:   CIDP (chronic inflammatory demyelinating polyneuropathy) (HCC)   Past Medical History:  Past Medical History:  Diagnosis Date   ETOH abuse    Gout    Past Surgical History:  Past Surgical History:  Procedure Laterality Date   FOOT SURGERY      Assessment & Plan Clinical Impression: Patient is a 48 y.o. right-handed male with history of CIDP (chronic demyelinating polyneuropathy) followed by neurology services Dr. Godwin Lat as well as history of chronic alcohol  use diorder, gouty arthritis maintained on Uloric , seizure disorder maintained on Trileptal , chronic neuropathic pain, tobacco use. Per chart review patient lives alone. Two-level home/used a cane/rolling walker for ambulation. Reports independence with ADLs. His mother and family check on him as needed. Presented 08/08/2023 to [facility] with progressive weakness increasing falls and unable to carry out his ADLs. Admission chemistries unremarkable except urine drug screen positive for marijuana, sodium 125, potassium 2.6, glucose 559, hemoglobin A1c 8.9, osmolality 360,ammonia 24. He was seen in consultation by neurology services suspect CIDP exacerbation placed on IVIG x 5 days completing 08/12/2023. Hospital course complicated by alcohol  withdrawal he was initially in the ICU maintained on CIWA protocol and started on Precedex  drip as well as phenobarbital . Cardiology services consulted Dr. Armida Lander 08/14/2023 for evaluation of SVT and atrial tachycardia started on short acting Cardizem . Echocardiogram ejection fraction of  60 to 65% no wall motion abnormalities. There was mild left ventricular hypertrophy as well as grade 1 diastolic dysfunction. His Cardizem  was transition to Cardizem  CD 120 mg daily 08/15/2023 with cardiac rate controlled. Subcutaneous heparin  initiated for DVT prophylaxis. His overall strength and mobility continues to improve he is being tapered off of prednisone . Blood sugars monitored was no previous history of diabetes and monitoring of blood sugars with taper of prednisone  findings of noted elevated hemoglobin A1c of 8.9 with Tradjenta  initiated 08/13/2023 with sliding scale insulin . Hyponatremia improving felt to be secondary to hyperglycemia chronic steroid use with latest sodium of 132. Tolerating a regular consistency diet. Therapy evaluations completed due to patient decreased functional mobility was admitted for a comprehensive rehab program. Patient transferred to CIR on 08/19/2023 .   Patient currently requires  CGA  with mobility secondary to muscle weakness, decreased cardiorespiratoy endurance, decreased coordination, and decreased standing balance and decreased balance strategies.  Prior to hospitalization, patient was modified independent  with mobility and lived with Alone in a House home.  Home access is 2 in front, 6 at backStairs to enter.  Patient will benefit from skilled PT intervention to maximize safe functional mobility, minimize fall risk, and decrease caregiver burden for planned discharge home with 24 hour supervision.  Anticipate patient will benefit from follow up HH at discharge.  PT - End of Session Activity Tolerance: Tolerates < 10 min activity with changes in vital signs Endurance Deficit: Yes PT Assessment Rehab Potential (ACUTE/IP ONLY): Good PT Barriers to Discharge: Inaccessible home environment;New diabetic;Decreased caregiver support;Lack of/limited family support;Insurance for SNF coverage PT Patient demonstrates impairments in the following area(s):  Balance;Endurance;Pain;Safety;Sensory PT Transfers Functional Problem(s): Bed Mobility;Bed to Chair;Car;Furniture;Floor PT Locomotion Functional Problem(s): Ambulation;Stairs PT Plan PT Intensity: Minimum of 1-2  x/day ,45 to 90 minutes PT Frequency: 5 out of 7 days PT Duration Estimated Length of Stay: 7-10 days PT Treatment/Interventions: Ambulation/gait training;Discharge planning;Functional mobility training;Psychosocial support;Therapeutic Activities;Visual/perceptual remediation/compensation;Balance/vestibular training;Disease management/prevention;Neuromuscular re-education;Skin care/wound management;Therapeutic Exercise;Wheelchair propulsion/positioning;Cognitive remediation/compensation;DME/adaptive equipment instruction;Pain management;Splinting/orthotics;UE/LE Strength taining/ROM;Community reintegration;Patient/family education;Stair training;UE/LE Coordination activities PT Transfers Anticipated Outcome(s): IND PT Locomotion Anticipated Outcome(s): Mod I/ IND PT Recommendation Recommendations for Other Services: Therapeutic Recreation consult Therapeutic Recreation Interventions: Stress management;Outing/community reintergration;Kitchen group Follow Up Recommendations: Home health PT;Outpatient PT;24 hour supervision/assistance Patient destination: Home   PT Evaluation Precautions/Restrictions Precautions Precautions: Fall Precaution Comments: reduced activity tolerance, watch pulse, painful neuropathy in arms and from waist down Restrictions Weight Bearing Restrictions Per Provider Order: No  Pain Pain Assessment Pain Scale: 0-10 Pain Score: 9  Pain Type: Chronic pain;Neuropathic pain Pain Location: Arm Pain Orientation: Left;Right Pain Descriptors / Indicators: Constant;Burning;Tingling Pain Frequency: Occasional Pain Onset: On-going Patients Stated Pain Goal: 0 Pain Intervention(s): Medication (See eMAR) Multiple Pain Sites: No Pain Interference Pain  Interference Pain Effect on Sleep: 4. Almost constantly Pain Interference with Therapy Activities: 4. Almost constantly Pain Interference with Day-to-Day Activities: 4. Almost constantly Home Living/Prior Functioning Home Living Available Help at Discharge: Family;Available PRN/intermittently Type of Home: House Home Access: Stairs to enter [employer] of Steps: 2 in front, 6 at back Entrance Stairs-Rails: Right Home Layout: One level;Laundry or work area in basement [employer]: Health visitor: Standard  Lives With: Alone Vision/Perception  Vision - History Ability to See in Adequate Light: 1 Impaired Vision - Assessment Additional Comments: reports that over time his near sight has declined and needs readers but doesnt have any Perception Perception: Within Functional Limits Praxis Praxis: WFL  Cognition Overall Cognitive Status: Within Functional Limits for tasks assessed Arousal/Alertness: Awake/alert Attention: Focused;Sustained;Selective Focused Attention: Appears intact Sustained Attention: Appears intact Selective Attention: Appears intact Memory: Appears intact Awareness: Appears intact Problem Solving: Appears intact Safety/Judgment: Appears intact Sensation Sensation Light Touch: Impaired Detail Light Touch Impaired Details: Impaired RUE;Impaired LUE;Impaired RLE;Impaired LLE Proprioception: Impaired Detail;Impaired by gross assessment (RLE>LLE) Proprioception Impaired Details: Impaired RLE;Impaired LLE Coordination Gross Motor Movements are Fluid and Coordinated: Yes Fine Motor Movements are Fluid and Coordinated: No Motor  Motor Motor: Within Functional Limits Motor - Skilled Clinical Observations: generalized debility; elevated HR with activity and at rest   Trunk/Postural Assessment  Cervical Assessment Cervical Assessment: Within Functional Limits Thoracic Assessment Thoracic Assessment: Exceptions to [facility] (rounded  shoulders) Lumbar Assessment Lumbar Assessment: Within Functional Limits Postural Control Postural Control: Within Functional Limits  Balance Balance Balance Assessed: Yes Standardized Balance Assessment Standardized Balance Assessment: Berg Balance Test Berg Balance Test Sit to Stand: Able to stand  independently using hands Standing Unsupported: Able to stand safely 2 minutes Sitting with Back Unsupported but Feet Supported on Floor or Stool: Able to sit safely and securely 2 minutes Stand to Sit: Sits safely with minimal use of hands Transfers: Able to transfer safely, definite need of hands Standing Unsupported with Eyes Closed: Able to stand 10 seconds with supervision Standing Ubsupported with Feet Together: Able to place feet together independently and stand for 1 minute with supervision From Standing, Reach Forward with Outstretched Arm: Can reach forward >5 cm safely (2") From Standing Position, Pick up Object from Floor: Able to pick up shoe, needs supervision Standing Unsupported, Alternately Place Feet on Step/Stool: Able to complete 4 steps without aid or supervision Standing Unsupported, One Foot in Front: Able to plae foot ahead of the other independently and hold 30 seconds Static  Sitting Balance Static Sitting - Balance Support: Feet supported Static Sitting - Level of Assistance: 7: Independent Dynamic Sitting Balance Dynamic Sitting - Balance Support: Feet supported;During functional activity Dynamic Sitting - Level of Assistance: 5: Stand by assistance;6: Modified independent (Device/Increase time) Static Standing Balance Static Standing - Balance Support: During functional activity Static Standing - Level of Assistance: Other (comment) (CGA) Dynamic Standing Balance Dynamic Standing - Balance Support: During functional activity Dynamic Standing - Level of Assistance: Other (comment) (CGA) Extremity Assessment      RLE Assessment RLE Assessment: Within  Functional Limits General Strength Comments: grossly 4 to 4+/5 for all functional mobility; increased weakness with fatigue LLE Assessment LLE Assessment: Within Functional Limits General Strength Comments: grossly 4 to 4+/5 for all functional mobility; increased weakness with fatigue  Care Tool Care Tool Bed Mobility Roll left and right activity   Roll left and right assist level: Independent with assistive device    Sit to lying activity   Sit to lying assist level: Independent with assistive device    Lying to sitting on side of bed activity   Lying to sitting on side of bed assist level: the ability to move from lying on the back to sitting on the side of the bed with no back support.: Supervision/Verbal cueing     Care Tool Transfers Sit to stand transfer   Sit to stand assist level: Supervision/Verbal cueing    Chair/bed transfer   Chair/bed transfer assist level: Supervision/Verbal cueing    Car transfer   Car transfer assist level: Supervision/Verbal cueing      Care Tool Locomotion Ambulation   Assist level: Contact Guard/Touching assist Assistive device: No Device Max distance: 90 ft  Walk 10 feet activity   Assist level: Supervision/Verbal cueing Assistive device: No Device   Walk 50 feet with 2 turns activity   Assist level: Supervision/Verbal cueing Assistive device: No Device  Walk 150 feet activity   Assist level: Contact Guard/Touching assist Assistive device: No Device  Walk 10 feet on uneven surfaces activity   Assist level: Contact Guard/Touching assist Assistive device: Other (comment) (None)  Stairs   Assist level: Contact Guard/Touching assist Stairs assistive device: 2 hand rails Max number of stairs: 12  Walk up/down 1 step activity   Walk up/down 1 step (curb) assist level: Contact Guard/Touching assist Walk up/down 1 step or curb assistive device: 2 hand rails  Walk up/down 4 steps activity   Walk up/down 4 steps assist level: Contact  Guard/Touching assist Walk up/down 4 steps assistive device: 2 hand rails  Walk up/down 12 steps activity   Walk up/down 12 steps assist level: Contact Guard/Touching assist Walk up/down 12 steps assistive device: 2 hand rails  Pick up small objects from floor   Pick up small object from the floor assist level: Supervision/Verbal cueing    Wheelchair Is the patient using a wheelchair?: No (refused use of w/c) Type of Wheelchair: Manual Wheelchair activity did not occur: Refused      Wheel 50 feet with 2 turns activity Wheelchair 50 feet with 2 turns activity did not occur: Refused    Wheel 150 feet activity Wheelchair 150 feet activity did not occur: Refused      Refer to Care Plan for Long Term Goals  SHORT TERM GOAL WEEK 1 PT Short Term Goal 1 (Week 1): STG = LTG d/t ELOS  Recommendations for other services: Therapeutic Recreation  Kitchen group and Stress management  Skilled Therapeutic Intervention Mobility Bed Mobility Bed Mobility:  Rolling Right;Rolling Left;Supine to Sit;Sit to Supine Rolling Right: Independent with assistive device Rolling Left: Independent with assistive device Supine to Sit: Supervision/Verbal cueing Sit to Supine: Supervision/Verbal cueing Transfers Transfers: Stand Pivot Transfers;Sit to Stand;Stand to Sit Sit to Stand: Supervision/Verbal cueing Stand to Sit: Supervision/Verbal cueing Stand Pivot Transfers: Supervision/Verbal cueing Transfer (Assistive device): None Locomotion  Gait Ambulation: Yes Gait Assistance: Supervision/Verbal cueing;Contact Guard/Touching assist Gait Distance (Feet): 90 Feet Assistive device: None Gait Gait: Yes Gait Pattern: Within Functional Limits Stairs / Additional Locomotion Stairs: Yes Stairs Assistance: Contact Guard/Touching assist;Supervision/Verbal cueing Stair Management Technique: Two rails Number of Stairs: 12 Height of Stairs: 6 (and 3") Ramp: Supervision/Verbal cueing Wheelchair  Mobility Wheelchair Mobility: No   Skilled Intervention: PT Evaluation completed; see above for results. PT educated patient in roles of PT vs OT, PT POC, rehab potential, rehab goals, and discharge recommendations along with recommendation for follow-up rehabilitation services. Individual treatment initiated:  Patient supine in bed and asleep upon PT arrival. Patient takes time to fully rouse, then alert and agreeable to PT session. Pt describes continuous pain complaint from peripheral neuropathy 2/2 CIPD diagnosis throughout session.  Therapeutic Activity: Bed Mobility: Patient performed supine to/from sit with supervision. No cueing needed for technique. Transfers: Patient performed sit <> stand and stand pivot transfers with supervision throughout session. Close supervision/ CGA required with fatigue which sets in fast with mobility d/t low activity tolerance. Car transfer performed with supervision/ light CGA and step-in technique to reported height of sister's vehicle, whom pt thinks will pick him up on d/c.   Gait Training:  Patient ambulated short, in-room distances without AD and supervision as well as 41' x2 using no AD with close supervision d/t decreased activity tolerance. Ambulated with slightly decreased but consistent pace. VC for energy conservation.   Neuromuscular Re-ed: NMR facilitated during session with focus on standing balance, proprioception, motor control. Pt guided in Berg Balance Assessment. Pt with increased difficulty performing NBOS, EC condition, and reaching outside of BOS.   NMR performed for improvements in motor control and coordination, balance, sequencing, judgement, and self confidence/ efficacy in performing all aspects of mobility at highest level of independence.   Pt relates lightheadedness following last bit of mobility. BP checked with reading at 123/97 (106), pulse 115. Pt relates diastolic number is high for him and knows HR is also higher than  pta. Allowed for many seated rest breaks throughout session to maintain lower pulse and BP.   Patient supine in bed at end of session with brakes locked, bed alarm set, and all needs within reach. Oriented to time and time of next therapy session.   Discharge Criteria: Patient will be discharged from PT if patient refuses treatment 3 consecutive times without medical reason, if treatment goals not met, if there is a change in medical status, if patient makes no progress towards goals or if patient is discharged from hospital.  The above assessment, treatment plan, treatment alternatives and goals were discussed and mutually agreed upon: by patient  Donne Gage PT, DPT, CSRS 08/20/2023, 1:37 PM

## 2023-08-21 DIAGNOSIS — G6181 Chronic inflammatory demyelinating polyneuritis: Secondary | ICD-10-CM | POA: Diagnosis not present

## 2023-08-21 LAB — GLUCOSE, CAPILLARY
Glucose-Capillary: 97 mg/dL (ref 70–99)
Glucose-Capillary: 142 mg/dL — ABNORMAL HIGH (ref 70–99)
Glucose-Capillary: 195 mg/dL — ABNORMAL HIGH (ref 70–99)
Glucose-Capillary: 182 mg/dL — ABNORMAL HIGH (ref 70–99)

## 2023-08-21 MED ORDER — ACETAMINOPHEN 325 MG PO TABS: 650.0000 mg | ORAL_TABLET | Freq: Four times a day (QID) | ORAL | Status: DC | PRN

## 2023-08-21 MED ORDER — ENOXAPARIN SODIUM 40 MG/0.4ML IJ SOSY
40.0000 mg | PREFILLED_SYRINGE | INTRAMUSCULAR | Status: DC
  Administered 2023-08-21 – 2023-08-26 (×5): 40 mg via SUBCUTANEOUS
  Filled 2023-08-21 (×5): qty 0.4

## 2023-08-21 MED ORDER — ADULT MULTIVITAMIN W/MINERALS CH: 1.0000 | ORAL_TABLET | Freq: Every day | ORAL | Status: AC

## 2023-08-21 NOTE — Care Management (Signed)
Inpatient Rehabilitation Center Individual Statement of Services  Patient Name:  Connor Copeland  Date:  08/21/2023  Welcome to the Inpatient Rehabilitation Center.  Our goal is to provide you with an individualized program based on your diagnosis and situation, designed to meet your specific needs.  With this comprehensive rehabilitation program, you will be expected to participate in at least 3 hours of rehabilitation therapies Monday-Friday, with modified therapy programming on the weekends.  Your rehabilitation program will include the following services:  Physical Therapy (PT), Occupational Therapy (OT), 24 hour per day rehabilitation nursing, Therapeutic Recreaction (TR), Psychology, Neuropsychology, Care Coordinator, Rehabilitation Medicine, Nutrition Services, Pharmacy Services, and Other  Weekly team conferences will be held on Tuesdays to discuss your progress.  Your Inpatient Rehabilitation Care Coordinator will talk with you frequently to get your input and to update you on team discussions.  Team conferences with you and your family in attendance may also be held.  Expected length of stay: 5-10 days    Overall anticipated outcome: Independent  Depending on your progress and recovery, your program may change. Your Inpatient Rehabilitation Care Coordinator will coordinate services and will keep you informed of any changes. Your Inpatient Rehabilitation Care Coordinator's name and contact numbers are listed  below.  The following services may also be recommended but are not provided by the Inpatient Rehabilitation Center:  Driving Evaluations Home Health Rehabiltiation Services Outpatient Rehabilitation Services Vocational Rehabilitation   Arrangements will be made to provide these services after discharge if needed.  Arrangements include referral to agencies that provide these services.  Your insurance has been verified to be:  [facility]  Your primary doctor is:  Nita Sells  Pertinent information will be shared with your doctor and your insurance company.  Inpatient Rehabilitation Care Coordinator:  Susie Cassette [phone] or ([phone]  Information discussed with and copy given to patient by: Gretchen Short, 08/21/2023, 11:53 AM

## 2023-08-21 NOTE — Progress Notes (Signed)
Patient ID: Connor Copeland, male   DOB: [birth year], 48 y.o.   MRN: [id number]  [id number] spoke with pt mother to inform on short ELOS. Fam edu  on Monday 10am-12pm. SW will confirm d/c recs during family edu and d/c date.   Cecile Sheerer, MSW, LCSW Office: [phone] Cell: [phone] Fax: [phone]

## 2023-08-21 NOTE — Progress Notes (Signed)
Physical Therapy Session Note  Patient Details  Name: Connor Copeland MRN: [id number] Date of Birth: April 07, 1976  Today's Date: 08/21/2023 PT Individual Time: 1305-1400 PT Individual Time Calculation (min): 55 min   Short Term Goals: Week 1:  PT Short Term Goal 1 (Week 1): STG = LTG d/t ELOS  Skilled Therapeutic Interventions/Progress Updates: Pt presented in bed agreeable to therapy. Pt c/o pain generalized unrated, rest and repositioning provided as needed. Session focused on general activities for improved stamina/endurance. Pt completed bed mobility with supervision and donned shoes with set up. Pt the ambulated to ortho gym without AD and supervision. At high/low mat pt participated in use of zoom ball seated 2 x 15 with no significant increase in HR (from 109 to 115). Pt also participated in passing ball around back initially with textured volleyball then with .5Kg weighted ball while standing on airex x5 cw/ccw. Pt then ambulated over to NuStep and participated in x 3 rounds at L2 maintaining avg 70-80SPM. Pt's HR noted to maintain 113-115 after each round. Pt then ambulated back to room in same manner as prior and pt left sitting EOB with call bell within reach and needs met.      Therapy Documentation Precautions:  Precautions Precautions: Fall Precaution Comments: reduced activity tolerance, watch pulse, painful neuropathy in arms and from waist down Restrictions Weight Bearing Restrictions Per Provider Order: No General:   Vital Signs:   Pain:   Mobility:   Locomotion :    Trunk/Postural Assessment :    Balance:   Exercises:   Other Treatments:      Therapy/Group: Individual Therapy  Lesa Vandall 08/21/2023, 3:42 PM

## 2023-08-21 NOTE — Progress Notes (Signed)
Physical Therapy Session Note  Patient Details  Name: Connor Copeland MRN: [id number] Date of Birth: 01/21/1976  Today's Date: 08/21/2023 PT Individual Time: 1005-1100 PT Individual Time Calculation (min): 55 min   Short Term Goals: Week 1:  PT Short Term Goal 1 (Week 1): STG = LTG d/t ELOS  Skilled Therapeutic Interventions/Progress Updates:      Therapy Documentation Precautions:  Precautions Precautions: Fall Precaution Comments: reduced activity tolerance, watch pulse, painful neuropathy in arms and from waist down Restrictions Weight Bearing Restrictions Per Provider Order: No General:   Pt reports unrated bilateral leg pain and significant fatigue and muscle soreness from yesterday's therapy sessions. Pt received PRN pain medications from RN just prior to session start. Pt agreeable to session focusing on general endurance, LE strengthening, and balance training. Educated patient on different sensory systems used in balance.   Pt ambulated ~136ft from room to therapy gym with SBA. Pt presents with wide BOS, increased lumbar lordosis, and stiff BUE and BLE movements during gait.   In therapy gym, pt completed two rounds of circuit training: - 5x sit<>stands with SBA from compliant mat table w/o use of UE (pt had increased trunk flexion d/t glute weakness) - 6x standing chest press with tidal tank and SBA for balance and UE strength - 30s standing balance with eyes closed and narrow BOS with CGA (some increased sway but no LOB) Pt required significant rest breaks between each exercise d/t fatigue. Pt had LE quad fasciculations while standing. Encouraged pt to discuss accommodations with is employer for decreased endurance when he first returns to work.   Pt ambulated ~183ft back to room with SBA and was left supine in bed with alarm on and all needs in reach.       Therapy/Group: Individual Therapy  [name] 08/21/2023, 1:09 PM

## 2023-08-21 NOTE — Progress Notes (Signed)
Occupational Therapy Session Note  Patient Details  Name: Connor Copeland MRN: [id number] Date of Birth: Jan 01, 1976  Today's Date: 08/21/2023 OT Individual Time: 7829-5621 OT Individual Time Calculation (min): 70 min    Short Term Goals: Week 1:  OT Short Term Goal 1 (Week 1): STG=LTG  Skilled Therapeutic Interventions/Progress Updates:    Pt resting in bed upon arrival. Pt reported he is "really sore" from therapy previous day. Pt requested to use toilet. All amb with supervision. Toileting with supervision. Pt amb without AD to day room (305') without rest. HR 131. Pt required extended rest break before amb to main gym. Following extended rest break, pt completed ball toss activity at rebounder 2x10 with extended rest break. Continued discharge planning and DME requirements. No DME needed. Pt returned to room and remained EOB with all needs within reach. Bed alarm activated.   Therapy Documentation Precautions:  Precautions Precautions: Fall Precaution Comments: reduced activity tolerance, watch pulse, painful neuropathy in arms and from waist down Restrictions Weight Bearing Restrictions Per Provider Order: No Pain:  Pt denies pain this morning  Therapy/Group: Individual Therapy  [name] 08/21/2023, 9:28 AM

## 2023-08-21 NOTE — Progress Notes (Signed)
PROGRESS NOTE   Subjective/Complaints:  Pt reports tired and sore- wants to go back to sleep- asked me to turn off lights when I left.   LBM yesterday per pt.   ROS:  Pt denies SOB, abd pain, CP, N/V/C/D, and vision changes  Negative except for HPI  Objective:   No results found. Recent Labs    08/19/23 1841 08/20/23 0518  WBC 10.4 8.8  HGB 14.0 12.3*  HCT 42.9 36.6*  PLT 714* 604*   Recent Labs    08/19/23 0357 08/19/23 1841 08/20/23 0518  NA 135  --  135  K 3.9  --  4.1  CL 100  --  102  CO2 26  --  24  GLUCOSE 91  --  107*  BUN 11  --  9  CREATININE 0.59* 0.76 0.73  CALCIUM 8.4*  --  8.5*   No intake or output data in the 24 hours ending 08/21/23 0847      Physical Exam: Vital Signs Blood pressure 97/71, pulse 92, temperature 98 F (36.7 C), resp. rate 18, height 6\' 2"  (1.88 m), weight 105.5 kg, SpO2 96%.   General: awake, but just woke him up- on side in bed;  NAD HENT: conjugate gaze; oropharynx moist CV: regular rate and rhythm; no JVD Pulmonary: CTA B/L; no W/R/R- good air movement GI: soft, NT, ND, (+)BS Psychiatric: flat, gruff, sleepy Neurological: Ox3 Musculoskeletal:     Cervical back: Neck supple. No tenderness.     Comments: 5-/5 in Ue's and LE's- throughout all muscles-   Skin:    General: Skin is warm and dry.     Comments: Ecchymoses on arms B/L   Neurological:     Mental Status: He is alert.     Comments: .  Patient is alert.  Follows simple commands.  Makes eye contact with examiner.  Cooperative with exam.  Provides name and age.  Limited with fair medical historian. Decreased to light touch from knees down- but also c/o nerve pain in arms and legs   Assessment/Plan: 1. Functional deficits which require 3+ hours per day of interdisciplinary therapy in a comprehensive inpatient rehab setting. Physiatrist is providing close team supervision and 24 hour management of active  medical problems listed below. Physiatrist and rehab team continue to assess barriers to discharge/monitor patient progress toward functional and medical goals  Care Tool:  Bathing    Body parts bathed by patient: Right arm, Left upper leg, Right lower leg, Left arm, Chest, Left lower leg, Abdomen, Front perineal area, Buttocks, Right upper leg, Face         Bathing assist Assist Level: Supervision/Verbal cueing     Upper Body Dressing/Undressing Upper body dressing   What is the patient wearing?: Pull over shirt    Upper body assist Assist Level: Set up assist    Lower Body Dressing/Undressing Lower body dressing      What is the patient wearing?: Pants, Underwear/pull up     Lower body assist Assist for lower body dressing: Set up assist     Toileting Toileting    Toileting assist Assist for toileting: Supervision/Verbal cueing     Transfers Chair/bed  transfer  Transfers assist     Chair/bed transfer assist level: Supervision/Verbal cueing     Locomotion Ambulation   Ambulation assist      Assist level: Contact Guard/Touching assist Assistive device: No Device Max distance: 90 ft   Walk 10 feet activity   Assist     Assist level: Supervision/Verbal cueing Assistive device: No Device   Walk 50 feet activity   Assist    Assist level: Supervision/Verbal cueing Assistive device: No Device    Walk 150 feet activity   Assist    Assist level: Contact Guard/Touching assist Assistive device: No Device    Walk 10 feet on uneven surface  activity   Assist     Assist level: Contact Guard/Touching assist Assistive device: Other (comment) (None)   Wheelchair     Assist Is the patient using a wheelchair?: No (refused use of w/c) Type of Wheelchair: Manual Wheelchair activity did not occur: Refused         Wheelchair 50 feet with 2 turns activity    Assist    Wheelchair 50 feet with 2 turns activity did not occur:  Refused       Wheelchair 150 feet activity     Assist  Wheelchair 150 feet activity did not occur: Refused       Blood pressure 97/71, pulse 92, temperature 98 F (36.7 C), resp. rate 18, height 6\' 2"  (1.88 m), weight 105.5 kg, SpO2 96%.  Medical Problem List and Plan: 1. 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  Completed 5-day course of IVIG 08/12/2023.  Prednisone taper as directed.  Follow-up outpatient neurology services Dr. Epimenio Foot             -patient may  shower             -ELOS/Goals: ~2 weeks- Mod I           Con't CIR PT and OT- d/w therapy- they feel he only needs short stay- even though needs to go home Mod I 2.  Antithrombotics: -DVT/anticoagulation:  Pharmaceutical: Heparin  1/16- will change SQ Heparin to Lovenox so less shots             -antiplatelet therapy: N/A 3. Pain Management: Cymbalta 60 mg daily, tramadol/hydrocodone as needed             1/15- will increase Cymbalta to 120 mg daily- monitor Na- and see if helps nerve pain- allergic to gabapentin and Lyrica due to swelling that was severe.  4. Mood/Behavior/Sleep: Pamelor 50 mg nightly             -antipsychotic agents:  5. Neuropsych/cognition: This patient is capable of making decisions on his own behalf. 6. Skin/Wound Care: Routine skin checks 7. Fluids/Electrolytes/Nutrition: Routine in and outs with follow-up chemistries 8.  Alcohol abuse/withdrawal.  Patient required Precedex and phenobarb during hospital stay that is since been discontinued.  Provide counseling             1/15- Thiamine going to PO route, not IV anymore.  9.  SVT/atrial tachycardia.  Cardizem 120 mg daily.  Cardiac rate controlled.  Follow-up outpatient cardiology services 10.  New onset diabetes mellitus.  Hemoglobin A1c 8.9.  Tradjenta 5 mg daily.  No current plan for home insulin             1/15-1/16 CBGs looking good overall- con't regimen 11.  Hyponatremia.  Felt to be induced by hyperglycemia.  Follow-up  chemistries  1/15 - Na 135 today- doing better 12.  Tobacco abuse.  Provide counseling.  Continue NicoDerm patch 13.  History of seizure disorder.Currently on  Trileptal 300 mg daily and 600 mg nightly.Lamictal has been discontinued             1/15- pt asking to decrease Trileptal- said it makes him talk "crazy"- and his balance is off. Na 135- so doing better- Will call Dr Epimenio Foot and see if can decrease Trileptal at all? 14.  History of gouty arthritis.  Uloric 40 mg daily      LOS: 2 days A FACE TO FACE EVALUATION WAS PERFORMED  Letasha Kershaw 08/21/2023, 8:47 AM

## 2023-08-22 DIAGNOSIS — G6181 Chronic inflammatory demyelinating polyneuritis: Secondary | ICD-10-CM | POA: Diagnosis not present

## 2023-08-22 LAB — GLUCOSE, CAPILLARY
Glucose-Capillary: 100 mg/dL — ABNORMAL HIGH (ref 70–99)
Glucose-Capillary: 186 mg/dL — ABNORMAL HIGH (ref 70–99)
Glucose-Capillary: 221 mg/dL — ABNORMAL HIGH (ref 70–99)
Glucose-Capillary: 203 mg/dL — ABNORMAL HIGH (ref 70–99)

## 2023-08-22 NOTE — Patient Care Conference (Signed)
Inpatient RehabilitationTeam Conference and Plan of Care Update Date: 08/22/2023   Time: 0959 am    Patient Name: Connor Copeland      Medical Record Number: [id number]  Date of Birth: [birth year] Sex: Male         Room/Bed: 4M03C/4M03C-01 Payor Info: Payor: Advertising copywriter / Plan: [employer] OTHER / Product Type: *No Product type* /    Admit Date/Time:  08/19/2023  4:55 PM  Primary Diagnosis:  CIDP (chronic inflammatory demyelinating polyneuropathy) [facility])  Hospital Problems: Principal Problem:   CIDP (chronic inflammatory demyelinating polyneuropathy) [facility])    Expected Discharge Date: Expected Discharge Date: 08/26/23  Team Members Present: Physician leading conference: Dr. Genice Rouge Social Worker Present: Cecile Sheerer, LCSWA Nurse Present: Konrad Dolores, RN PT Present: Bernie Covey, PT OT Present: Roney Mans, OT;Ardis Rowan, COTA     Current Status/Progress Goal Weekly Team Focus  Bowel/Bladder   Patient is continent of bowel and bladder. LBM: 08/22/23   remain continent of b/b   Frequent toileting qshift and PRN    Swallow/Nutrition/ Hydration               ADL's   supervision to mod I   mod I   endurance training, cardiopulonary endurance with activity    Mobility   CGA to supervision with gait and transfers, limited by endurance   mod I to supervision with LRAD  enduance training    Communication                Safety/Cognition/ Behavioral Observations               Pain   patient complains of burning, tingling, numbing pain in all extremities 9.5/10   patient states pain on a scale of 4/10   patient is able to function with discomfort.    Skin   pt skin is intact   patient skin remain intact  skin assessment qshift and PRN      Discharge Planning:  Pt will discharge to home alone with PRN support from his mother. Outpatient at [facility] for PT/OT. Fam edu on Monday (1/21) with pt mother. SW will confirm  there are no barriers to discharge.   Team Discussion: Patient is doing well overall post CIDP. Limited by drowsiness and poor endurance.   Patient on target to meet rehab goals: yes,   *See Care Plan and progress notes for long and short-term goals.   Revisions to Treatment Plan:  N/a   Teaching Needs: Safety, medications, transfers,etc   Current Barriers to Discharge: none  Possible Resolutions to Barriers: Family education Outpatient follow-up      Medical Summary Current Status: Pt has CIDP- continent of B/B-  has chronic nerve pain.  Barriers to Discharge: Behavior/Mood;Uncontrolled Pain;Self-care education;Other (comments)  Barriers to Discharge Comments: no seizures- but limited due ot balance issues from Trileptal and nerve pain as well as mild weakness from CIDP Possible Resolutions to Barriers/Weekly Focus: increased Duloxetine to help with nerve pain- has prn pain meds for soreness from therapy- d/c Tuesday   Continued Need for Acute Rehabilitation Level of Care: The patient requires daily medical management by a physician with specialized training in physical medicine and rehabilitation for the following reasons: Direction of a multidisciplinary physical rehabilitation program to maximize functional independence : Yes Medical management of patient stability for increased activity during participation in an intensive rehabilitation regime.: Yes Analysis of laboratory values and/or radiology reports with any subsequent need for medication adjustment and/or medical  intervention. : Yes   I attest that I was present, lead the team conference, and concur with the assessment and plan of the team.   Gwenyth Allegra 08/22/2023, 4:39 PM

## 2023-08-22 NOTE — Progress Notes (Signed)
Physical Therapy Session Note  Patient Details  Name: TAVARI COVALT MRN: [id number] Date of Birth: October 25, 1975  Today's Date: 08/22/2023 PT Individual Time: 1416-1504 PT Individual Time Calculation (min): 48 min   Short Term Goals: Week 1:  PT Short Term Goal 1 (Week 1): STG = LTG d/t ELOS  Skilled Therapeutic Interventions/Progress Updates:      Therapy Documentation Precautions:  Precautions Precautions: Fall Precaution Comments: reduced activity tolerance, watch pulse, painful neuropathy in arms and from waist down Restrictions Weight Bearing Restrictions Per Provider Order: No General:   Pt received supine in bed and reports generalized fatigue and muscle soreness. Pt reports some elbow discomfort along lateral R epicondyle, not requiring further intervention. Pt agreeable to session working on stairs, car transfers managing the rollator, and activity tolerance.   Pt ambulated with rollator and supervision to ortho gym and performed car transfer with SBA. Pt then practiced car transfer placing the rollator in a mock trunk of the car with SBA. Pt ambulated with supervision to therapy gym and performed x12 steps with R railing and self selected step through ascending and descending step-to and step through gait pattern with SBA progressing to supervision. After seated rest break, pt ambulated with rollator and supervision to ortho gym for 3x4-35min on NuStep and levels 2 and 3. Pt maintained cadence of ~80 steps per minute with 2 minute rest break between bouts and HR between 112-117bpm. Pt ambulated with rollator and supervision back to room, and stated he was more agreeable to rollator as means for community integration and energy conservation upon d/c. Pt was left supine in bed with all needs in reach and alarm on.     Therapy/Group: Individual Therapy  [name] 08/22/2023, 4:00 PM

## 2023-08-22 NOTE — Progress Notes (Signed)
Physical Therapy Session Note  Patient Details  Name: Connor Copeland MRN: [id number] Date of Birth: [birth year]  Today's Date: 08/22/2023 PT Individual Time: 0805-0917 PT Individual Time Calculation (min): 72 min   Short Term Goals: Week 1:  PT Short Term Goal 1 (Week 1): STG = LTG d/t ELOS  Skilled Therapeutic Interventions/Progress Updates: Pt presented in bed sleeping but easily aroused and agreeable to therapy. Pt states pain 9/10, not medicated as of yes. Nursing notified and received meds during session. Pt completed bed mobility mod I from flat bed and PTA donned TED hose total A for time management. While receiving am meds pt ate breakfast EOB. Donned shoes mod I, pt requesting to use bathroom prior to leaving room. Pt stood and ambulated to bathroom no AD distance supervision. Completed toilet transfers distant supervision (+BM/urinary void). Pt ambualted to skink to complete handy hygiene and was face. After seated rest pt then ambulated to day room with supervision and without reset break. HR after ambulation 133 which decreased down to 120-121. Pt then participated in corn hole for dynamic reaching initially on level surface then with single LE on 3in step. Pt noted to have LOB to L initially when on level surface requiring light minA for correction. Pt noted to note have LOB when LE placed on step. Pt then challenged to perform toe taps to 3in steps alternating x 10. Pt cued to slow pace however noted to have LOB again to L. Pt then ambulated back to room at end of session.      Therapy Documentation Precautions:  Precautions Precautions: Fall Precaution Comments: reduced activity tolerance, watch pulse, painful neuropathy in arms and from waist down Restrictions Weight Bearing Restrictions Per Provider Order: No General:   Vital Signs: Therapy Vitals Temp: 98.7 F (37.1 C) Resp: 18 BP: 105/82 Patient Position (if appropriate): Lying Oxygen Therapy SpO2: 96 % O2  Device: Room Air   Therapy/Group: Individual Therapy  Amey Hossain 08/22/2023, 4:41 PM

## 2023-08-22 NOTE — IPOC Note (Signed)
Overall Plan of Care [facility]) Patient Details Name: Connor Copeland MRN: [id number] DOB: 1975-08-19  Admitting Diagnosis: CIDP (chronic inflammatory demyelinating polyneuropathy) [facility])  Hospital Problems: Principal Problem:   CIDP (chronic inflammatory demyelinating polyneuropathy) (HCC)     Functional Problem List: Nursing Endurance, Medication Management, Nutrition, Pain, Safety  PT Balance, Endurance, Pain, Safety, Sensory  OT Balance, Pain, Endurance, Motor, Sensory  SLP    TR         Basic ADL's: OT Bathing, Dressing, Toileting     Advanced  ADL's: OT       Transfers: PT Bed Mobility, Bed to Chair, Car, [employer], Civil Service fast streamer, Research scientist (life sciences): PT Ambulation, Stairs     Additional Impairments: OT None  SLP        TR      Anticipated Outcomes Item Anticipated Outcome  Self Feeding n/a  Swallowing      Basic self-care  mod I - I  Toileting  mod I - I   Bathroom Transfers mod I - I  Bowel/Bladder  N/A  Transfers  IND  Locomotion  Mod I/ IND  Communication     Cognition     Pain  < 4 W/ PRN MEDS  Safety/Judgment  manage safety with supervision and cues   Therapy Plan: PT Intensity: Minimum of 1-2 x/day ,45 to 90 minutes PT Frequency: 5 out of 7 days PT Duration Estimated Length of Stay: 7-10 days OT Intensity: Minimum of 1-2 x/day, 45 to 90 minutes OT Frequency: 5 out of 7 days OT Duration/Estimated Length of Stay: ~5- 7 days     Team Interventions: Nursing Interventions Patient/Family Education, Medication Management, Disease Management/Prevention, Pain Management, Discharge Planning  PT interventions Ambulation/gait training, Discharge planning, Functional mobility training, Psychosocial support, Therapeutic Activities, Visual/perceptual remediation/compensation, Balance/vestibular training, Disease management/prevention, Neuromuscular re-education, Skin care/wound management, Therapeutic Exercise, Wheelchair  propulsion/positioning, Cognitive remediation/compensation, DME/adaptive equipment instruction, Pain management, Splinting/orthotics, UE/LE Strength taining/ROM, Firefighter, Equities trader education, Museum/gallery curator, UE/LE Coordination activities  OT Interventions Warden/ranger, Community reintegration, Disease mangement/prevention, Development worker, international aid stimulation, Neuromuscular re-education, Patient/family education, Self Care/advanced ADL retraining, Therapeutic Exercise, UE/LE Coordination activities, Discharge planning, DME/adaptive equipment instruction, Functional mobility training, Pain management, Psychosocial support, Skin care/wound managment, Therapeutic Activities, UE/LE Strength taining/ROM  SLP Interventions    TR Interventions    SW/CM Interventions Discharge Planning, Psychosocial Support, Patient/Family Education   Barriers to Discharge MD  Medical stability, Home enviroment access/loayout, Lack of/limited family support, and Weight  Nursing Home environment access/layout, Lack of/limited family support 2 level 4 ste ; bil rails solo  PT Inaccessible home environment, New diabetic, Decreased caregiver support, Lack of/limited family support, Community education officer for SNF coverage    OT      SLP      SW Decreased caregiver support, Lack of/limited family support, Community education officer for SNF coverage     Team Discharge Planning: Destination: PT-Home ,OT- Home , SLP-  Projected Follow-up: PT-Home health PT, Outpatient PT, 24 hour supervision/assistance, OT-  Home health OT, SLP-  Projected Equipment Needs: PT- , OT- To be determined, SLP-  Equipment Details: PT- , OT-  Patient/family involved in discharge planning: PT- Patient,  OT-Patient, SLP-   MD ELOS: 5-7 days Medical Rehab Prognosis:  Excellent Assessment: The patient has been admitted for CIR therapies with the diagnosis of CIDP. The team will be addressing functional mobility, strength, stamina, balance,  safety, adaptive techniques and equipment, self-care, bowel and bladder mgt, patient and caregiver education, . Goals have  been set at mod I. Anticipated discharge destination is home alone.        See Team Conference Notes for weekly updates to the plan of care

## 2023-08-22 NOTE — Progress Notes (Signed)
Occupational Therapy Session Note  Patient Details  Name: Connor Copeland MRN: [id number] Date of Birth: March 12, 1976  Today's Date: 08/22/2023 OT Individual Time: 7829-5621 OT Individual Time Calculation (min): 45 min    Short Term Goals: Week 1:  OT Short Term Goal 1 (Week 1): STG=LTG   Skilled Therapeutic Interventions/Progress Updates:    Pt bed level at time of session, extended time/verbal/tactile cues to wake up the pt. No pain at rest but 9/10 at end of session and nursing aware providing meds at end of session. Bed mob MOD I and ambulated to/from therapy gym with no AD and SBA/CGA. Reviewed techniques throughout for energy conservation as well as pacing. Pt performing sit <> stands 1x5 no weight, 1x15 with 4#, single leg taps for approx 6" cones with single UE support, single leg stance for taps sequencing up to 3 at a time, all to improve endurance and tolerance. Note HR did raise to 126, seated recovery break and breathing techniques until returned to 118. Bed level at end of session all needs met.   Therapy Documentation Precautions:  Precautions Precautions: Fall Precaution Comments: reduced activity tolerance, watch pulse, painful neuropathy in arms and from waist down Restrictions Weight Bearing Restrictions Per Provider Order: No     Therapy/Group: Individual Therapy  Erasmo Score 08/22/2023, 11:19 AM

## 2023-08-22 NOTE — Progress Notes (Signed)
PROGRESS NOTE   Subjective/Complaints:  Pt reports tired and really sore in arms and legs and back from doing therapy.  LBM overnight.     ROS:  Pt denies SOB, abd pain, CP, N/V/C/D, and vision changes  Negative except for HPI  Objective:   No results found. Recent Labs    08/19/23 1841 08/20/23 0518  WBC 10.4 8.8  HGB 14.0 12.3*  HCT 42.9 36.6*  PLT 714* 604*   Recent Labs    08/19/23 1841 08/20/23 0518  NA  --  135  K  --  4.1  CL  --  102  CO2  --  24  GLUCOSE  --  107*  BUN  --  9  CREATININE 0.76 0.73  CALCIUM  --  8.5*    Intake/Output Summary (Last 24 hours) at 08/22/2023 0748 Last data filed at 08/21/2023 1838 Gross per 24 hour  Intake 720 ml  Output --  Net 720 ml        Physical Exam: Vital Signs Blood pressure 113/76, pulse 93, temperature 97.8 F (36.6 C), resp. rate 18, height 6\' 2"  (1.88 m), weight 105.5 kg, SpO2 98%.    General: awake, alert, sleepy- on side in bed; woke him up; NAD HENT: conjugate gaze; oropharynx moist CV: regular rate and rhythm; no JVD Pulmonary: CTA B/L; no W/R/R- good air movement GI: soft, NT, ND, (+)BS Psychiatric: flat, gruff, sleepy Neurological: Ox3  Musculoskeletal:     Cervical back: Neck supple. No tenderness.     Comments: 5-/5 in Ue's and LE's- throughout all muscles-   Skin:    General: Skin is warm and dry.     Comments: Ecchymoses on arms B/L   Neurological:     Mental Status: He is alert.     Comments: .  Patient is alert.  Follows simple commands.  Makes eye contact with examiner.  Cooperative with exam.  Provides name and age.  Limited with fair medical historian. Decreased to light touch from knees down- but also c/o nerve pain in arms and legs   Assessment/Plan: 1. Functional deficits which require 3+ hours per day of interdisciplinary therapy in a comprehensive inpatient rehab setting. Physiatrist is providing close team  supervision and 24 hour management of active medical problems listed below. Physiatrist and rehab team continue to assess barriers to discharge/monitor patient progress toward functional and medical goals  Care Tool:  Bathing    Body parts bathed by patient: Right arm, Left upper leg, Right lower leg, Left arm, Chest, Left lower leg, Abdomen, Front perineal area, Buttocks, Right upper leg, Face         Bathing assist Assist Level: Supervision/Verbal cueing     Upper Body Dressing/Undressing Upper body dressing   What is the patient wearing?: Pull over shirt    Upper body assist Assist Level: Set up assist    Lower Body Dressing/Undressing Lower body dressing      What is the patient wearing?: Pants, Underwear/pull up     Lower body assist Assist for lower body dressing: Set up assist     Toileting Toileting    Toileting assist Assist for toileting: Supervision/Verbal cueing  Transfers Chair/bed transfer  Transfers assist     Chair/bed transfer assist level: Supervision/Verbal cueing     Locomotion Ambulation   Ambulation assist      Assist level: Contact Guard/Touching assist Assistive device: No Device Max distance: 90 ft   Walk 10 feet activity   Assist     Assist level: Supervision/Verbal cueing Assistive device: No Device   Walk 50 feet activity   Assist    Assist level: Supervision/Verbal cueing Assistive device: No Device    Walk 150 feet activity   Assist    Assist level: Contact Guard/Touching assist Assistive device: No Device    Walk 10 feet on uneven surface  activity   Assist     Assist level: Contact Guard/Touching assist Assistive device: Other (comment) (None)   Wheelchair     Assist Is the patient using a wheelchair?: No (refused use of w/c) Type of Wheelchair: Manual Wheelchair activity did not occur: Refused         Wheelchair 50 feet with 2 turns activity    Assist    Wheelchair 50  feet with 2 turns activity did not occur: Refused       Wheelchair 150 feet activity     Assist  Wheelchair 150 feet activity did not occur: Refused       Blood pressure 113/76, pulse 93, temperature 97.8 F (36.6 C), resp. rate 18, height 6\' 2"  (1.88 m), weight 105.5 kg, SpO2 98%.  Medical Problem List and Plan: 1. 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  Completed 5-day course of IVIG 08/12/2023.  Prednisone taper as directed.  Follow-up outpatient neurology services Dr. Epimenio Foot             -patient may  shower             -ELOS/Goals: ~2 weeks- Mod I           Con't CIR PT and OT  Pt wants to go home Monday afternoon- will check with staff, but he has family training Monday   d/w therapy- they feel he only needs short stay- even though needs to go home Mod I 2.  Antithrombotics: -DVT/anticoagulation:  Pharmaceutical: Heparin  1/16- will change SQ Heparin to Lovenox so less shots             -antiplatelet therapy: N/A 3. Pain Management: Cymbalta 60 mg daily, tramadol/hydrocodone as needed             1/15- will increase Cymbalta to 120 mg daily- monitor Na- and see if helps nerve pain- allergic to gabapentin and Lyrica due to swelling that was severe.  4. Mood/Behavior/Sleep: Pamelor 50 mg nightly             -antipsychotic agents:  5. Neuropsych/cognition: This patient is capable of making decisions on his own behalf. 6. Skin/Wound Care: Routine skin checks 7. Fluids/Electrolytes/Nutrition: Routine in and outs with follow-up chemistries 8.  Alcohol abuse/withdrawal.  Patient required Precedex and phenobarb during hospital stay that is since been discontinued.  Provide counseling             1/15- Thiamine going to PO route, not IV anymore.  9.  SVT/atrial tachycardia.  Cardizem 120 mg daily.  Cardiac rate controlled.  Follow-up outpatient cardiology services 10.  New onset diabetes mellitus.  Hemoglobin A1c 8.9.  Tradjenta 5 mg daily.  No current plan for home  insulin             1/15-1/16 CBGs looking good  overall- con't regimen  1/17- somewhat labile, but all less than 200- con't regimen for now 11.  Hyponatremia.  Felt to be induced by hyperglycemia.  Follow-up chemistries             1/15 - Na 135 today- doing better 12.  Tobacco abuse.  Provide counseling.  Continue NicoDerm patch 13.  History of seizure disorder.Currently on  Trileptal 300 mg daily and 600 mg nightly.Lamictal has been discontinued             1/15- pt asking to decrease Trileptal- said it makes him talk "crazy"- and his balance is off. Na 135- so doing better- Will call Dr Epimenio Foot and see if can decrease Trileptal at all? 14.  History of gouty arthritis.  Uloric 40 mg daily       LOS: 3 days A FACE TO FACE EVALUATION WAS PERFORMED  Antoninette Lerner 08/22/2023, 7:48 AM

## 2023-08-22 NOTE — Progress Notes (Signed)
Inpatient Rehabilitation Care Coordinator Assessment and Plan Patient Details  Name: Connor Copeland MRN: [id number] Date of Birth: 1976/05/18  Today's Date: 08/22/2023  Hospital Problems: Principal Problem:   CIDP (chronic inflammatory demyelinating polyneuropathy) (HCC)  Past Medical History:  Past Medical History:  Diagnosis Date   ETOH abuse    Gout    Past Surgical History:  Past Surgical History:  Procedure Laterality Date   FOOT SURGERY     Social History:  reports that he has been smoking cigarettes. He has never used smokeless tobacco. He reports current alcohol use of about 42.0 standard drinks of alcohol per week. He reports that he does not use drugs.  Family / Support Systems Marital Status: Divorced How Long?: 10 years Spouse/Significant Other: Divorced Children: No Chidlren Other Supports: PRN support from mother and sister Anticipated Caregiver: N/A Ability/Limitations of Caregiver: PRN support from mother who can only provide supervision. Pt sister works 12hr shifts and unavailable. Caregiver Availability: Other (Comment) (PRN) Family Dynamics: Pt lives alone  Social History Preferred language: English Religion: Methodist Cultural Background: Pt worked at [employer] and [employer] and currently on disability through employer Education: high school grad Health Literacy - How often do you need to have someone help you when you read instructions, pamphlets, or other written material from your doctor or pharmacy?: Never Writes: Yes Employment Status: Disabled Date Retired/Disabled/Unemployed: Pt worked at [employer] and [employer] and currently on disability through employer. He reports he may have to apply for SSDI. Legal History/Current Legal Issues: Pt admits to jail time in which he went to jail on weekends for 4 months 10 years ago due to a DWI. Denies any other instance. Guardian/Conservator: N/A; sister POA   Abuse/Neglect Abuse/Neglect Assessment Can Be  Completed: Yes Physical Abuse: Denies Verbal Abuse: Denies Sexual Abuse: Denies Exploitation of patient/patient's resources: Denies Self-Neglect: Denies  Patient response to: Social Isolation - How often do you feel lonely or isolated from those around you?: Never  Emotional Status Pt's affect, behavior and adjustment status: Pt in good spirits at time of visti Recent Psychosocial Issues: Denies Psychiatric History: Denies Substance Abuse History: Denies  Patient / Family Perceptions, Expectations & Goals Pt/Family understanding of illness & functional limitations: Pt and family have a general understanding of pt care needs Premorbid pt/family roles/activities: Independent Anticipated changes in roles/activities/participation: Assistance with ADLs/IADLs Pt/family expectations/goals: pt goal is to work on stamina, endurance, and balance.  Community [employer] Agencies: None Premorbid Home Care/DME Agencies: None Transportation available at discharge: Mother Is the patient able to respond to transportation needs?: Yes In the past 12 months, has lack of transportation kept you from medical appointments or from getting medications?: No In the past 12 months, has lack of transportation kept you from meetings, work, or from getting things needed for daily living?: No Resource referrals recommended: Neuropsychology  Discharge Planning Living Arrangements: Alone Support Systems: Parent Type of Residence: Private residence Insurance Resources: Media planner (specify) Education officer, museum) Financial Resources: Employment Financial Screen Referred: No Living Expenses: Psychologist, sport and exercise Management: Patient Does the patient have any problems obtaining your medications?: No Home Management: Pt manages all home care needs Patient/Family Preliminary Plans: No changes; transportation to appointments Care Coordinator Barriers to Discharge: Decreased caregiver support, Lack of/limited family support,  Insurance for SNF coverage Care Coordinator Anticipated Follow Up Needs: HH/OP Expected length of stay: 5-10 days  Clinical Impression Pt is not a Cytogeneticist. Reports his sister is likely POA. DME- RW.   Connor Copeland 08/22/2023, 1:38 PM

## 2023-08-22 NOTE — Progress Notes (Signed)
Physical Therapy Session Note  Patient Details  Name: Connor Copeland MRN: [id number] Date of Birth: 1975/11/08  Today's Date: 08/22/2023 PT Individual Time: 1120-1203 PT Individual Time Calculation (min): 43 min   Short Term Goals: Week 1:  PT Short Term Goal 1 (Week 1): STG = LTG d/t ELOS  Skilled Therapeutic Interventions/Progress Updates:      Therapy Documentation Precautions:  Precautions Precautions: Fall Precaution Comments: reduced activity tolerance, watch pulse, painful neuropathy in arms and from waist down Restrictions Weight Bearing Restrictions Per Provider Order: No General:   Pt agreeable to therapy session focusing on activity tolerance, but states he only has about 25% of energy compared to normal and reports unrated soreness from therapy yesterday. Pt denies pain other than muscle soreness.   Discussed benefits of rollator for energy conservation and community integration with pt and pt was agreeable to trying AD. Pt educated on proper use of rollator.   Pt ambulated from room to dayroom with SBA with rollator and reported leg shakes d/t fatigue. In dayroom, pt did x2 trials of golf ball putting to increase salience of therapy exercises while working on dynamic standing balance and anticipatory postural control. After seated rest break, pt was instructed to retrieve golf balls and while retrieving one using the putter as an assistive device, pt had anterior LOB requiring x2 min-modA for balance recovery. HR 133bpm, discussed effect of exercise and LOB on HR. After seated rest break, pt ambulated with rollator and SBA back to room.   Pt left supine in bed with alarm on and all needs in reach. RN notified about pain medication per pt request.    Therapy/Group: Individual Therapy  [name] 08/22/2023, 12:39 PM

## 2023-08-22 NOTE — Progress Notes (Signed)
Patient ID: Connor Copeland, male   DOB: 11-Apr-1976, 48 y.o.   MRN: [id number]  Per medical team, pt can discharge on Tuesday. SW will discussed with pt and pt mother.  1055-SW spoke with pt mother to discuss above. She is an agreement, and able to transport pt to outpatient- [name].   Sw met with pt in room to discuss above. Outpatient PT/OT referral faxed to [facility] (p:[phone]/f:[phone]).  Cecile Sheerer, MSW, LCSW Office: [phone] Cell: [phone] Fax: [phone]

## 2023-08-23 ENCOUNTER — Encounter (HOSPITAL_COMMUNITY): Payer: Self-pay | Admitting: Physical Medicine and Rehabilitation

## 2023-08-23 DIAGNOSIS — G6181 Chronic inflammatory demyelinating polyneuritis: Secondary | ICD-10-CM | POA: Diagnosis not present

## 2023-08-23 LAB — GLUCOSE, CAPILLARY
Glucose-Capillary: 98 mg/dL (ref 70–99)
Glucose-Capillary: 240 mg/dL — ABNORMAL HIGH (ref 70–99)
Glucose-Capillary: 282 mg/dL — ABNORMAL HIGH (ref 70–99)
Glucose-Capillary: 148 mg/dL — ABNORMAL HIGH (ref 70–99)

## 2023-08-23 NOTE — Progress Notes (Signed)
Physical Therapy Session Note  Patient Details  Name: Connor Copeland MRN: [id number] Date of Birth: [birth year]  Today's Date: 08/23/2023 PT Individual Time: 0730-0825 PT Individual Time Calculation (min): 55 min   Short Term Goals: Week 1:  PT Short Term Goal 1 (Week 1): STG = LTG d/t ELOS  Skilled Therapeutic Interventions/Progress Updates:    Chart reviewed and pt agreeable to therapy. Pt received 9/10 with BUE and BLE c/o pain. Session focused on functional transfers and amb to promote safe home access. Pt initiated session with transfer to EOB with S and maxA to don TED hose. Pt then amb to toilet and completed all self-care with S + no AD. Pt then completed long amb for 20 mins and >1045ft using distant S + rollator. Pt noted to have good safety awareness for rollator use and rest breaks in setting of  low activity tolerance. Pt then completed navigation of 8 steps using distant S + R HR per home set up/ Pt then amb to room using same assist level. At end of session, pt was left seated EOB with alarm engaged, nurse call bell and all needs in reach.     Therapy Documentation Precautions:  Precautions Precautions: Fall Precaution Comments: reduced activity tolerance, watch pulse, painful neuropathy in arms and from waist down Restrictions Weight Bearing Restrictions Per Provider Order: No General:      Therapy/Group: Individual Therapy  Dionne Milo, PT, DPT 08/23/2023, 9:29 AM

## 2023-08-23 NOTE — Progress Notes (Signed)
PROGRESS NOTE   Subjective/Complaints: No new complaints this morning Sleepy Patient's chart reviewed- No issues reported overnight Vitals signs stable   ROS:  Pt denies SOB, abd pain, CP, N/V/C/D, and vision changes  Negative except for HPI  Objective:   No results found. No results for input(s): "WBC", "HGB", "HCT", "PLT" in the last 72 hours.  No results for input(s): "NA", "K", "CL", "CO2", "GLUCOSE", "BUN", "CREATININE", "CALCIUM" in the last 72 hours.   Intake/Output Summary (Last 24 hours) at 08/23/2023 1227 Last data filed at 08/23/2023 0734 Gross per 24 hour  Intake 590 ml  Output --  Net 590 ml        Physical Exam: Vital Signs Blood pressure 112/77, pulse 88, temperature 97.8 F (36.6 C), resp. rate 17, height 6\' 2"  (1.88 m), weight 105.5 kg, SpO2 98%.    General: awake, alert, sleepy- on side in bed; woke him up; NAD HENT: conjugate gaze; oropharynx moist CV: regular rate and rhythm; no JVD Pulmonary: CTA B/L; no W/R/R- good air movement GI: soft, NT, ND, (+)BS Psychiatric: flat, gruff, sleepy Neurological: Ox3  Musculoskeletal:     Cervical back: Neck supple. No tenderness.     Comments: 5-/5 in Ue's and LE's- throughout all muscles-   Skin:    General: Skin is warm and dry.     Comments: Ecchymoses on arms B/L   Neurological:     Mental Status: He is alert.     Comments: .  Patient is alert.  Follows simple commands.  Makes eye contact with examiner.  Cooperative with exam.  Provides name and age.  Limited with fair medical historian. Decreased to light touch from knees down- but also c/o nerve pain in arms and legs , stable 1/18  Assessment/Plan: 1. Functional deficits which require 3+ hours per day of interdisciplinary therapy in a comprehensive inpatient rehab setting. Physiatrist is providing close team supervision and 24 hour management of active medical problems listed  below. Physiatrist and rehab team continue to assess barriers to discharge/monitor patient progress toward functional and medical goals  Care Tool:  Bathing    Body parts bathed by patient: Right arm, Left upper leg, Right lower leg, Left arm, Chest, Left lower leg, Abdomen, Front perineal area, Buttocks, Right upper leg, Face         Bathing assist Assist Level: Supervision/Verbal cueing     Upper Body Dressing/Undressing Upper body dressing   What is the patient wearing?: Pull over shirt    Upper body assist Assist Level: Set up assist    Lower Body Dressing/Undressing Lower body dressing      What is the patient wearing?: Pants, Underwear/pull up     Lower body assist Assist for lower body dressing: Set up assist     Toileting Toileting    Toileting assist Assist for toileting: Supervision/Verbal cueing     Transfers Chair/bed transfer  Transfers assist     Chair/bed transfer assist level: Supervision/Verbal cueing     Locomotion Ambulation   Ambulation assist      Assist level: Contact Guard/Touching assist Assistive device: No Device Max distance: 90 ft   Walk 10 feet activity  Assist     Assist level: Supervision/Verbal cueing Assistive device: No Device   Walk 50 feet activity   Assist    Assist level: Supervision/Verbal cueing Assistive device: No Device    Walk 150 feet activity   Assist    Assist level: Contact Guard/Touching assist Assistive device: No Device    Walk 10 feet on uneven surface  activity   Assist     Assist level: Contact Guard/Touching assist Assistive device: Other (comment) (None)   Wheelchair     Assist Is the patient using a wheelchair?: No (refused use of w/c) Type of Wheelchair: Manual Wheelchair activity did not occur: Refused         Wheelchair 50 feet with 2 turns activity    Assist    Wheelchair 50 feet with 2 turns activity did not occur: Refused        Wheelchair 150 feet activity     Assist  Wheelchair 150 feet activity did not occur: Refused       Blood pressure 112/77, pulse 88, temperature 97.8 F (36.6 C), resp. rate 17, height 6\' 2"  (1.88 m), weight 105.5 kg, SpO2 98%.  Medical Problem List and Plan: 1. 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  Completed 5-day course of IVIG 08/12/2023.  Prednisone taper as directed.  Follow-up outpatient neurology services Dr. Epimenio Foot             -patient may  shower             -ELOS/Goals: ~2 weeks- Mod I           Con't CIR PT and OT  Pt wants to go home Monday afternoon- will check with staff, but he has family training Monday   d/w therapy- they feel he only needs short stay- even though needs to go home Mod I 2.  Antithrombotics: -DVT/anticoagulation:  Pharmaceutical: Heparin  1/16- will change SQ Heparin to Lovenox so less shots             -antiplatelet therapy: N/A 3. Pain Management: Cymbalta 60 mg daily, tramadol/hydrocodone as needed             1/15- will increase Cymbalta to 120 mg daily- monitor Na- and see if helps nerve pain- allergic to gabapentin and Lyrica due to swelling that was severe.  4. Mood/Behavior/Sleep: Pamelor 50 mg nightly             -antipsychotic agents:  5. Neuropsych/cognition: This patient is capable of making decisions on his own behalf. 6. Skin/Wound Care: Routine skin checks 7. Fluids/Electrolytes/Nutrition: Routine in and outs with follow-up chemistries 8.  Alcohol abuse/withdrawal.  Patient required Precedex and phenobarb during hospital stay that is since been discontinued.  Provide counseling             1/15- Thiamine going to PO route, not IV anymore.  9.  SVT/atrial tachycardia.  Cardizem 120 mg daily.  Cardiac rate controlled.  Follow-up outpatient cardiology services 10.  New onset diabetes mellitus.  Hemoglobin A1c 8.9.  Tradjenta 5 mg daily.  No current plan for home insulin             1/15-1/16 CBGs looking good overall-  con't regimen  1/17- somewhat labile, but all less than 200- con't regimen for now 11.  Hyponatremia.  Felt to be induced by hyperglycemia.  Follow-up chemistries             1/15 - Na 135 today- doing better  Repeat Na on  Monday  12.  Tobacco abuse.  Provide counseling.  Continue NicoDerm patch  13.  History of seizure disorder: continue   Trileptal 300 mg daily and 600 mg nightly.Lamictal has been discontinued             1/15- pt asking to decrease Trileptal- said it makes him talk "crazy"- and his balance is off. Na 135- so doing better- Will call Dr Epimenio Foot and see if can decrease Trileptal at all?  14.  History of gouty arthritis.  Continue Uloric 40 mg daily       LOS: 4 days A FACE TO FACE EVALUATION WAS PERFORMED  Drema Pry Serinity Ware 08/23/2023, 12:27 PM

## 2023-08-23 NOTE — Progress Notes (Signed)
Occupational Therapy Session Note  Patient Details  Name: Connor Copeland MRN: [id number] Date of Birth: 04-Dec-1975  Today's Date: 08/23/2023 OT Individual Time: 1311-1426 OT Individual Time Calculation (min): 75 min    Short Term Goals: Week 1:  OT Short Term Goal 1 (Week 1): STG=LTG  Skilled Therapeutic Interventions/Progress Updates:  Pt greeted finishing lunch, skilled OT session with focus on IADL participation, functional mobility, activity tolerance, and general conditioning.   Pain: Pt reported 8/10 pain,  OT offering intermediate rest breaks and positioning suggestions throughout session to address pain/fatigue and maximize participation/safety in session.   Functional Transfers: Sit<>stands and functional mobility from room to all therapy locations with close supervision + rollator.   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 Time dedicated to diving into work related tasks. Simulated loading and transportation of product items with 10# DB, as patient pushed grocery cart around main gym and day room to load DB. Total of 50# in grocery cart, pt ambulating greater than 274ft with x2 rest-breaks. HR highest at 122. Throughout activity, pt and OT discuss potential job modifications to promote patient's return to work such as shortening work days, establishing more consistent work schedules, and switching to a less laborious position. Pt receptive to education, stating "I've been thinking about all that. . . We will see."   Therapeutic Exercise: Pt able to teach back current Nustep routine, as performed with primary PT. Pt establishes new PR of 10 mins in one cycle at workload 3, highest HR at 120-123. Encouraged patient to increase workload to further progression in strengthening.    Education: Re-inforced need to wear footwear at home for decreased fall risk due to BLE sensory deficits.   Pt remained resting in  bed with 4Ps assessed and immediate needs met. Pt continues to be appropriate for skilled OT intervention to promote further functional independence in ADLs/IADLs.   Therapy Documentation Precautions:  Precautions Precautions: Fall Precaution Comments: reduced activity tolerance, watch pulse, painful neuropathy in arms and from waist down Restrictions Weight Bearing Restrictions Per Provider Order: No   Therapy/Group: Individual Therapy  Lou Cal, OTR/L, MSOT  08/23/2023, 6:16 AM

## 2023-08-24 DIAGNOSIS — G6181 Chronic inflammatory demyelinating polyneuritis: Secondary | ICD-10-CM | POA: Diagnosis not present

## 2023-08-24 LAB — GLUCOSE, CAPILLARY
Glucose-Capillary: 98 mg/dL (ref 70–99)
Glucose-Capillary: 214 mg/dL — ABNORMAL HIGH (ref 70–99)
Glucose-Capillary: 206 mg/dL — ABNORMAL HIGH (ref 70–99)
Glucose-Capillary: 117 mg/dL — ABNORMAL HIGH (ref 70–99)

## 2023-08-24 MED ORDER — AMLODIPINE BESYLATE 2.5 MG PO TABS
2.5000 mg | ORAL_TABLET | Freq: Every day | ORAL | Status: DC
Start: 2023-08-24 — End: 2023-08-26
  Administered 2023-08-24 – 2023-08-26 (×3): 2.5 mg via ORAL
  Filled 2023-08-24 (×3): qty 1

## 2023-08-24 NOTE — Progress Notes (Signed)
PROGRESS NOTE   Subjective/Complaints: CBGs ranging from 80s to 200s Sleepy this morning Tolerated therapy well Has 9/10 pain prior to receiving medications  ROS:  Pt denies SOB, abd pain, CP, N/V/C/D, and vision changes  Negative except for HPI  Objective:   No results found. No results for input(s): "WBC", "HGB", "HCT", "PLT" in the last 72 hours.  No results for input(s): "NA", "K", "CL", "CO2", "GLUCOSE", "BUN", "CREATININE", "CALCIUM" in the last 72 hours.   Intake/Output Summary (Last 24 hours) at 08/24/2023 1642 Last data filed at 08/24/2023 1309 Gross per 24 hour  Intake 828 ml  Output --  Net 828 ml        Physical Exam: Vital Signs Blood pressure (!) 133/94, pulse 99, temperature 98.1 F (36.7 C), temperature source Oral, resp. rate 18, height 6\' 2"  (1.88 m), weight 105.5 kg, SpO2 100%.    General: awake, alert, sleepy- on side in bed; woke him up; NAD HENT: conjugate gaze; oropharynx moist CV: regular rate and rhythm; no JVD Pulmonary: CTA B/L; no W/R/R- good air movement GI: soft, NT, ND, (+)BS Psychiatric: flat, gruff, sleepy Neurological: Ox3  Musculoskeletal:     Cervical back: Neck supple. No tenderness.     Comments: 5-/5 in Ue's and LE's- throughout all muscles-   Skin:    General: Skin is warm and dry.     Comments: Ecchymoses on arms B/L   Neurological:     Mental Status: He is alert.     Comments: .  Patient is alert.  Follows simple commands.  Makes eye contact with examiner.  Cooperative with exam.  Provides name and age.  Limited with fair medical historian. Decreased to light touch from knees down- but also c/o nerve pain in arms and legs , stable 1/19  Assessment/Plan: 1. Functional deficits which require 3+ hours per day of interdisciplinary therapy in a comprehensive inpatient rehab setting. Physiatrist is providing close team supervision and 24 hour management of active  medical problems listed below. Physiatrist and rehab team continue to assess barriers to discharge/monitor patient progress toward functional and medical goals  Care Tool:  Bathing    Body parts bathed by patient: Right arm, Left upper leg, Right lower leg, Left arm, Chest, Left lower leg, Abdomen, Front perineal area, Buttocks, Right upper leg, Face         Bathing assist Assist Level: Supervision/Verbal cueing     Upper Body Dressing/Undressing Upper body dressing   What is the patient wearing?: Pull over shirt    Upper body assist Assist Level: Set up assist    Lower Body Dressing/Undressing Lower body dressing      What is the patient wearing?: Pants, Underwear/pull up     Lower body assist Assist for lower body dressing: Set up assist     Toileting Toileting    Toileting assist Assist for toileting: Supervision/Verbal cueing     Transfers Chair/bed transfer  Transfers assist     Chair/bed transfer assist level: Supervision/Verbal cueing     Locomotion Ambulation   Ambulation assist      Assist level: Contact Guard/Touching assist Assistive device: No Device Max distance: 90 ft  Walk 10 feet activity   Assist     Assist level: Supervision/Verbal cueing Assistive device: No Device   Walk 50 feet activity   Assist    Assist level: Supervision/Verbal cueing Assistive device: No Device    Walk 150 feet activity   Assist    Assist level: Contact Guard/Touching assist Assistive device: No Device    Walk 10 feet on uneven surface  activity   Assist     Assist level: Contact Guard/Touching assist Assistive device: Other (comment) (None)   Wheelchair     Assist Is the patient using a wheelchair?: No (refused use of w/c) Type of Wheelchair: Manual Wheelchair activity did not occur: Refused         Wheelchair 50 feet with 2 turns activity    Assist    Wheelchair 50 feet with 2 turns activity did not occur:  Refused       Wheelchair 150 feet activity     Assist  Wheelchair 150 feet activity did not occur: Refused       Blood pressure (!) 133/94, pulse 99, temperature 98.1 F (36.7 C), temperature source Oral, resp. rate 18, height 6\' 2"  (1.88 m), weight 105.5 kg, SpO2 100%.  Medical Problem List and Plan: 1. 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  Completed 5-day course of IVIG 08/12/2023.  Prednisone taper as directed.  Follow-up outpatient neurology services Dr. Epimenio Foot             -patient may  shower             -ELOS/Goals: ~2 weeks- Mod I           Con't CIR PT and OT  Pt wants to go home Monday afternoon- will check with staff, but he has family training Monday   d/w therapy- they feel he only needs short stay- even though needs to go home Mod I 2.  Antithrombotics: -DVT/anticoagulation:  Pharmaceutical: Heparin  1/16- will change SQ Heparin to Lovenox so less shots             -antiplatelet therapy: N/A 3. Pain Management: Cymbalta 60 mg daily, tramadol/hydrocodone as needed             1/15- will increase Cymbalta to 120 mg daily- monitor Na- and see if helps nerve pain- allergic to gabapentin and Lyrica due to swelling that was severe.  4. Mood/Behavior/Sleep: Pamelor 50 mg nightly             -antipsychotic agents:  5. Neuropsych/cognition: This patient is capable of making decisions on his own behalf. 6. Skin/Wound Care: Routine skin checks 7. Fluids/Electrolytes/Nutrition: Routine in and outs with follow-up chemistries 8.  Alcohol abuse/withdrawal.  Patient required Precedex and phenobarb during hospital stay that is since been discontinued.  Provide counseling             1/15- Thiamine going to PO route, not IV anymore.  9.  SVT/atrial tachycardia.  Cardizem 120 mg daily.  Cardiac rate controlled.  Follow-up outpatient cardiology services 10.  New onset diabetes mellitus.  Hemoglobin A1c 8.9.  Tradjenta 5 mg daily.  No current plan for home insulin              1/15-1/16 CBGs looking good overall- con't regimen  1/17- somewhat labile, but all less than 200- con't regimen for now 11.  Hyponatremia.  Felt to be induced by hyperglycemia.  Follow-up chemistries             1/15 -  Na 135 today- doing better  Repeat Na on Monday  12.  Tobacco abuse.  Provide counseling.  continue NicoDerm patch  13.  History of seizure disorder: continue Trileptal 300 mg daily and 600 mg nightly.Lamictal has been discontinued             1/15- pt asking to decrease Trileptal- said it makes him talk "crazy"- and his balance is off. Na 135- so doing better- Will call Dr Epimenio Foot and see if can decrease Trileptal at all?  14.  History of gouty arthritis.  Continue Uloric 40 mg daily  15. Hypertension: amlodipine 2.62m daily started       LOS: 5 days A FACE TO FACE EVALUATION WAS PERFORMED  Connor Copeland Connor Copeland 08/24/2023, 4:42 PM

## 2023-08-24 NOTE — Progress Notes (Signed)
Physical Therapy Session Note  Patient Details  Name: Connor Copeland MRN: [id number] Date of Birth: July 22, 1976  Today's Date: 08/24/2023 PT Individual Time: 0900-1008 and 1305-1400 PT Individual Time Calculation (min): 68 min and 55 min  Short Term Goals: Week 1:  PT Short Term Goal 1 (Week 1): STG = LTG d/t ELOS  Skilled Therapeutic Interventions/Progress Updates: Pt presented in bed sleeping but easily aroused and agreeable to therapy. Pt states pain 9/10 however has not received am meds, nsg notified and meds received prior to leaving room. Session focused on endurance via ambulation with intermittent education on energy conservation. Pt completed bed mobility from flat bed with supervision. Pt donned shoes mod I. Pt then ambulated with use of rollator throughout hospital (to [employer], down to main entrance, across to atrium). Pt requesting x 1 bathroom break performed mod I (+ urinary void). Pt with brief seated rest break while on elevator and then an extended seated rest break in atrium prior to ambulating back to rehab unit. Pt demonstrating good safety with rollator throughout session. In unit pt participated in gait and balance activities including obstacle course incorporating stepping over hurdles, ambulating on compliant surface and tapping cones. Pt with x1 LOB initially when performing hurdles leading with LLE requiring light minA for correction. When pt repeated activity no LOB noted. Pt also participated in side stepping while standing in Airex beam inside parallel bars with pt demonstrating good ankle strategy. Pt also participated in ball toss while standing on Airex beam x 20 with noted increased effort shifting weight anteriorly. Pt also participated in Bi[employer]x LOB on static surface. Pt with 15% accuracy with most difficulty appearing to reach R posteriorlateral segments. Pt then requesting to return to room due to fatigue. Pt ambulated back to room with rollator and  supervision. Pt left seated EOB at end of session with call bell within reach and needs met.   Tx2: Pt presented in bed agreeable to therapy. Pt states unrated pain in extremities, no intervention requested at this time. Session focused on dynamic balance and endurance with use of Wii. Pt completed bed mobility mod I and ambulated to dayroom with rollator and distant supervision. Pt then participated in several Wii sport activities including [address] and [name]. Pt incorporating truncal rotation, pivoting, repeated sit to stands from rollator and mat. Pt with x 1 near LOB but was able to correct self without assist (PTA guarding). Pt was able to perform several golf swings before requiring seated rest. Pt indicated no increase in pain with activity and noted that stamina has been increasing. Pt then ambulated back to room at end of session and returned to bed. Pt left with call bell within reach and needs met.      Therapy Documentation Precautions:  Precautions Precautions: Fall Precaution Comments: reduced activity tolerance, watch pulse, painful neuropathy in arms and from waist down Restrictions Weight Bearing Restrictions Per Provider Order: No   Therapy/Group: Individual Therapy  Connor Copeland 08/24/2023, 12:26 PM

## 2023-08-24 NOTE — Progress Notes (Signed)
Occupational Therapy Session Note  Patient Details  Name: Connor Copeland MRN: [id number] Date of Birth: 05-Mar-1976  Today's Date: 08/24/2023 OT Individual Time: 1100-1200 OT Individual Time Calculation (min): 60 min    Short Term Goals: Week 1:  OT Short Term Goal 1 (Week 1): STG=LTG  Skilled Therapeutic Interventions/Progress Updates:    Patient in bed at the time of arrival indicating that he has a pain response of 9 on 0-10 for pain with bilateral upper and lower extremities, however, the pt stated that his pain was chronic in nature and he was willing to  work through the pain.  The pt was able to come from supine in bed to EOB with SBA.  The pt was able to come from EOB to standing using the Rolator for additional balance.  The pt was encouraged to maintain standing at EOB to resolve any light headiness that could result from a sudden change in position. The pt was able to walk >50 ft to the gym and position himself on the Nustep.  The pt was encouraged to incorporate relaxation breathing prior to starting .  The pt was able to complete 8 minutes on the NuStep.  The pt was able to come from sit to stand leaving the NuStep to ambulating to the mat.  The pt was encouraged to incorporate relaxation breathing and was informed of the benefits associated with its performance.  Next, the pt was instructed to place both hand on a 1lb dowel and  to bring the dowel into  shld flexion with symmetry of  BUE while coming from sit to stand 5x's with rest break as needed.   The pt required 1 rest break following this exercise. At the end of the session, the pt was able to walk back to his room , > 50 ft and sit EOB with SBA.  The pt was able to transfer  from EOB to supine in bed with  SBA.  Nursing was informed to keep an eye out regarding addressing any additional needs of the patient.    Therapy Documentation Precautions:  Precautions Precautions: Fall Precaution Comments: reduced activity  tolerance, watch pulse, painful neuropathy in arms and from waist down Restrictions Weight Bearing Restrictions Per Provider Order: No  Therapy/Group: Individual Therapy  Lavona Mound 08/24/2023, 12:57 PM

## 2023-08-25 ENCOUNTER — Other Ambulatory Visit (HOSPITAL_COMMUNITY): Payer: Self-pay

## 2023-08-25 DIAGNOSIS — G6181 Chronic inflammatory demyelinating polyneuritis: Secondary | ICD-10-CM | POA: Diagnosis not present

## 2023-08-25 LAB — GLUCOSE, CAPILLARY
Glucose-Capillary: 102 mg/dL — ABNORMAL HIGH (ref 70–99)
Glucose-Capillary: 129 mg/dL — ABNORMAL HIGH (ref 70–99)
Glucose-Capillary: 150 mg/dL — ABNORMAL HIGH (ref 70–99)
Glucose-Capillary: 188 mg/dL — ABNORMAL HIGH (ref 70–99)

## 2023-08-25 MED ORDER — AMLODIPINE BESYLATE 2.5 MG PO TABS
2.5000 mg | ORAL_TABLET | Freq: Every day | ORAL | 0 refills | Status: DC
  Filled 2023-08-25: qty 30, 30d supply, fill #0

## 2023-08-25 MED ORDER — DILTIAZEM HCL ER COATED BEADS 120 MG PO CP24
120.0000 mg | ORAL_CAPSULE | Freq: Every day | ORAL | 0 refills | Status: DC
  Filled 2023-08-25: qty 30, 30d supply, fill #0

## 2023-08-25 MED ORDER — FOLIC ACID 1 MG PO TABS
1.0000 mg | ORAL_TABLET | Freq: Every day | ORAL | 0 refills | Status: DC
  Filled 2023-08-25: qty 30, 30d supply, fill #0

## 2023-08-25 MED ORDER — TRAMADOL HCL 50 MG PO TABS
50.0000 mg | ORAL_TABLET | Freq: Four times a day (QID) | ORAL | 0 refills | Status: DC | PRN
  Filled 2023-08-25: qty 30, 8d supply, fill #0

## 2023-08-25 MED ORDER — LINAGLIPTIN 5 MG PO TABS
5.0000 mg | ORAL_TABLET | Freq: Every day | ORAL | 0 refills | Status: DC
  Filled 2023-08-25: qty 30, 30d supply, fill #0

## 2023-08-25 MED ORDER — NICOTINE 21 MG/24HR TD PT24
MEDICATED_PATCH | TRANSDERMAL | 1 refills | Status: DC
  Filled 2023-08-25: qty 28, 14d supply, fill #0

## 2023-08-25 MED ORDER — DULOXETINE HCL 60 MG PO CPEP
120.0000 mg | ORAL_CAPSULE | Freq: Every day | ORAL | 0 refills | Status: DC
  Filled 2023-08-25: qty 60, 30d supply, fill #0

## 2023-08-25 MED ORDER — OXCARBAZEPINE 300 MG PO TABS
ORAL_TABLET | ORAL | 0 refills | Status: DC
  Filled 2023-08-25: qty 90, 30d supply, fill #0

## 2023-08-25 MED ORDER — LIVING WELL WITH DIABETES BOOK
Freq: Once | Status: AC
  Filled 2023-08-25: qty 1

## 2023-08-25 MED ORDER — THIAMINE HCL 100 MG PO TABS
100.0000 mg | ORAL_TABLET | Freq: Every day | ORAL | 0 refills | Status: DC
  Filled 2023-08-25: qty 30, 30d supply, fill #0

## 2023-08-25 MED ORDER — HYDROCODONE-ACETAMINOPHEN 7.5-325 MG PO TABS
1.0000 | ORAL_TABLET | Freq: Three times a day (TID) | ORAL | 0 refills | Status: DC | PRN
  Filled 2023-08-25: qty 30, 10d supply, fill #0

## 2023-08-25 MED ORDER — PREDNISONE 10 MG PO TABS
ORAL_TABLET | ORAL | 0 refills | Status: AC
  Filled 2023-08-25: qty 17, 12d supply, fill #0

## 2023-08-25 MED ORDER — PANTOPRAZOLE SODIUM 40 MG PO TBEC
40.0000 mg | DELAYED_RELEASE_TABLET | Freq: Every day | ORAL | 0 refills | Status: DC
  Filled 2023-08-25: qty 30, 30d supply, fill #0

## 2023-08-25 MED ORDER — NORTRIPTYLINE HCL 25 MG PO CAPS
50.0000 mg | ORAL_CAPSULE | Freq: Every day | ORAL | 0 refills | Status: DC
  Filled 2023-08-25: qty 60, 30d supply, fill #0

## 2023-08-25 MED ORDER — MAGNESIUM OXIDE -MG SUPPLEMENT 400 (240 MG) MG PO TABS
400.0000 mg | ORAL_TABLET | Freq: Two times a day (BID) | ORAL | 0 refills | Status: AC
  Filled 2023-08-25: qty 60, 30d supply, fill #0

## 2023-08-25 MED ORDER — FEBUXOSTAT 40 MG PO TABS
40.0000 mg | ORAL_TABLET | Freq: Every day | ORAL | 0 refills | Status: DC
  Filled 2023-08-25: qty 30, 30d supply, fill #0

## 2023-08-25 NOTE — Plan of Care (Signed)
  Problem: RH Dressing Goal: LTG Patient will perform lower body dressing w/assist (OT) Description: LTG: Patient will perform lower body dressing with assist, with/without cues in positioning using equipment (OT) Outcome: Completed/Met Flowsheets (Taken 08/20/2023 1207 by Melonie Florida, OT) LTG: Pt will perform lower body dressing with assistance level of: Independent   Problem: RH Toileting Goal: LTG Patient will perform toileting task (3/3 steps) with assistance level (OT) Description: LTG: Patient will perform toileting task (3/3 steps) with assistance level (OT)  Outcome: Completed/Met Flowsheets (Taken 08/20/2023 1207 by Melonie Florida, OT) LTG: Pt will perform toileting task (3/3 steps) with assistance level: Independent with assistive device   Problem: RH Toilet Transfers Goal: LTG Patient will perform toilet transfers w/assist (OT) Description: LTG: Patient will perform toilet transfers with assist, with/without cues using equipment (OT) Outcome: Completed/Met Flowsheets (Taken 08/20/2023 1207 by Melonie Florida, OT) LTG: Pt will perform toilet transfers with assistance level of: Independent   Problem: RH Pre-functional/Other (Specify) Goal: RH LTG OT (Specify) 1 Description: RH LTG OT (Specify) 1 Outcome: Completed/Met Flowsheets (Taken 08/20/2023 1207 by Melonie Florida, OT) LTG: Other OT (Specify) 1: Pt will rate a 8 or les on the BORG scale during a functional ADL task   Problem: RH Furniture Transfers Goal: LTG Patient will perform furniture transfers w/assist (OT/PT) Description: LTG: Patient will perform furniture transfers  with assistance (OT/PT). Outcome: Completed/Met Flowsheets (Taken 08/20/2023 1239 by Loel Dubonnet, PT) LTG: Pt will perform furniture transfers with assist:: Independent with assistive device

## 2023-08-25 NOTE — Progress Notes (Addendum)
Patient ID: Connor Copeland, male   DOB: 01-29-1976, 48 y.o.   MRN: [id number]  Per PT, pt will need rollator. SW ordered item with [employer] via parachute.   *SW provided pt with contact information for [employer] to make copay for DME.  Cecile Sheerer, MSW, LCSW Office: [phone] Cell: [phone] Fax: [phone]

## 2023-08-25 NOTE — Progress Notes (Signed)
Occupational Therapy Discharge Summary  Patient Details  Name: Connor Copeland MRN: [id number] Date of Birth: 1975-08-08  Date of Discharge from OT service:August 25, 2023  Today's Date: 08/25/2023 OT Individual Time: 1191-4782 & 1100-1200 OT Individual Time Calculation (min): 45 min & 60 min   Patient has met 4 of 4 long term goals due to improved activity tolerance, improved balance, postural control, ability to compensate for deficits, and improved awareness.  Patient to discharge at overall Modified Independent level.  Patient's care partner is independent to provide the necessary  intermittent  assistance at discharge.    Reasons goals not met: All goals met  Recommendation:  Patient will benefit from ongoing skilled OT services in outpatient setting to continue to advance functional skills in the area of iADL and Vocation.  Equipment: No equipment provided  Reasons for discharge: treatment goals met and discharge from hospital  Patient/family agrees with progress made and goals achieved: Yes  OT Discharge Precautions/Restrictions  Precautions Precautions: Fall Restrictions Weight Bearing Restrictions Per Provider Order: No ADL ADL Eating: Independent Where Assessed-Eating:  (showre) Grooming: Modified independent Where Assessed-Grooming: Other (comment) Upper Body Bathing: Modified independent Where Assessed-Upper Body Bathing: Shower Lower Body Bathing: Modified independent Where Assessed-Lower Body Bathing: Shower Upper Body Dressing: Modified independent (Device) Where Assessed-Upper Body Dressing: Edge of bed Lower Body Dressing: Modified independent Where Assessed-Lower Body Dressing: Chair Toileting: Modified independent Toilet Transfer: Community education officer Method: Ambulating Tub/Shower Transfer: Modified independent Tub/Shower Transfer Method: Ship broker: Insurance underwriter: Modified  independent Film/video editor Method: Designer, industrial/product: Sales promotion account executive Baseline Vision/History: 0 No visual deficits;1 Wears glasses Patient Visual Report: No change from baseline Vision Assessment?: No apparent visual deficits Perception  Perception: Within Functional Limits Praxis Praxis: WFL Cognition Cognition Overall Cognitive Status: Within Functional Limits for tasks assessed Arousal/Alertness: Awake/alert Orientation Level: Place;Person;Situation Person: Oriented Place: Oriented Situation: Oriented Memory: Appears intact Attention: Focused;Sustained;Selective Safety/Judgment: Appears intact Brief Interview for Mental Status (BIMS) Repetition of Three Words (First Attempt): 3 Temporal Orientation: Year: Correct Temporal Orientation: Month: Accurate within 5 days Temporal Orientation: Day: Correct Recall: "Sock": Yes, no cue required Recall: "Blue": Yes, no cue required Recall: "Bed": Yes, no cue required BIMS Summary Score: 15 Sensation Sensation Light Touch: Appears Intact Stereognosis: Appears Intact Coordination Gross Motor Movements are Fluid and Coordinated: Yes Fine Motor Movements are Fluid and Coordinated: Yes Motor  Motor Motor: Within Functional Limits Mobility  Bed Mobility Bed Mobility: Rolling Right;Rolling Left;Supine to Sit;Sit to Supine Rolling Right: Independent with assistive device Rolling Left: Independent with assistive device Supine to Sit: Independent Sit to Supine: Independent Transfers Sit to Stand: Independent Stand to Sit: Independent  Trunk/Postural Assessment  Cervical Assessment Cervical Assessment: Within Functional Limits Thoracic Assessment Thoracic Assessment: Within Functional Limits Lumbar Assessment Lumbar Assessment: Within Functional Limits Postural Control Postural Control: Within Functional Limits  Balance Balance Balance Assessed: Yes Static Sitting Balance Static  Sitting - Balance Support: Feet supported Static Sitting - Level of Assistance: 7: Independent Dynamic Sitting Balance Dynamic Sitting - Balance Support: Feet supported;During functional activity Dynamic Sitting - Level of Assistance: 7: Independent Dynamic Sitting - Balance Activities: Forward lean/weight shifting;Reaching for objects Static Standing Balance Static Standing - Balance Support: During functional activity Static Standing - Level of Assistance: 7: Independent Dynamic Standing Balance Dynamic Standing - Balance Support: During functional activity Dynamic Standing - Level of Assistance: 7: Independent Dynamic Standing - Balance Activities: Reaching for objects Extremity/Trunk Assessment RUE Assessment  RUE Assessment: Within Functional Limits LUE Assessment LUE Assessment: Within Functional Limits  Session 1 General: "Hello!" Pt supine in bed asleep upon OT arrival, agreeable to OT session.  Pain: 9.5/10 pain reported in BLE, nursing medicated pt, activity, intermittent rest breaks, distractions provided for pain management, pt reports tolerable to proceed.   ADL: Pt completed grooming/oral hygiene at sink in standing without AD at Mod I level, able to retrieve washcloths from shelf in bathroom, reaching out of BOS at Mod I.   Other Treatments: Pt made Mod I in room d/t functional status and D/C tomorrow AM. Pt and OT discussed D/C and what it will entail. Pt and OT discussed choice of OPOT for higher level IADLs and pt requiring no OT equipment for home use. Pt and OT discussed energy conservation techniques for D/C when completing ADLs and IADLs.    Pt seated EOB with needs met.   Session 2 (family ed) General: "I am excited to get home and cook!" Pt seated EOB upon OT arrival, agreeable to OT. Mother present for family ed.   Pain:  5/10 pain reported in BLE, activity, intermittent rest breaks, distractions provided for pain management, pt reports tolerable to  proceed.   ADL: Pt demonstrated community mobility with rollator in order to prepare for community integration at D/C. Pt ambulated with rollator at Mod I requiring no rest breaks. Pt practices maneuvering into elevators, maneuvering around obstacles and ambulating at advanced distances.   Exercises: Pt completed 10 minutes of nu step bike in order to increase BUE/BLEstrength and endurance in preparation for increased independence in ADLs such as functional mobility. No Rest break , on level 4 resistance. Pt HR assessed after bike and 112 BPM.  Pt issued UE theraband HEP in order to increase functional strength, andurance and activity tolerance in order to increase independence in ADLs such as bathing. Pt issued red theraband and discussed direction/technique of exercises, demonstrating verbal understanding. Discussion of  3x10 exercises listed below: -elbow extensions - shoulder horizontal abduction -bicep curls  -shoulder flexion -diagonal shoulder flexion -external rotation   Other Treatments: Pt given energy conservation handout for reference during ADL/IADL activities at home. Pt and OT dicussed energy conservation techniques specifically for cooking d/t pt enjoying activity.    Pt EOB, needs met and 4Ps assessed. Mother present.   Velia Meyer, OTD, OTR/L 08/25/2023, 12:34 PM

## 2023-08-25 NOTE — Progress Notes (Signed)
Inpatient Rehabilitation Discharge Medication Review by a Pharmacist  A complete drug regimen review was completed for this patient to identify any potential clinically significant medication issues.  High Risk Drug Classes Is patient taking? Indication by Medication  Antipsychotic Yes Trileptal - neuropathy/sz  Anticoagulant No   Antibiotic No   Opioid Yes Norco- pain  Antiplatelet No   Hypoglycemics/insulin Yes Linagliptin - DM  Vasoactive Medication Yes Diltiazem - SVT  Chemotherapy No   Other Yes Atrovent/albuterol - COPD Cymbalta/pamelor  - neuropathy Uloric/prednisone - gout Mag-Ox/thiamine/folic acid/MVI - supplement Protonix - GERD Acetaminophen- pain  Flonase- congestion      Type of Medication Issue Identified Description of Issue Recommendation(s)  Drug Interaction(s) (clinically significant)     Duplicate Therapy     Allergy     No Medication Administration End Date     Incorrect Dose     Additional Drug Therapy Needed     Significant med changes from prior encounter (inform family/care partners about these prior to discharge).    Other       Clinically significant medication issues were identified that warrant physician communication and completion of prescribed/recommended actions by midnight of the next day:  No  Name of provider notified for urgent issues identified:   Provider Method of Notification:     Pharmacist comments:   Time spent performing this drug regimen review (minutes):  20   Jani Gravel, PharmD Clinical Pharmacist  08/25/2023 10:49 AM

## 2023-08-25 NOTE — Inpatient Diabetes Management (Signed)
Inpatient Diabetes Program Recommendations  AACE/ADA: New Consensus Statement on Inpatient Glycemic Control (2015)  Target Ranges:  Prepandial:   less than 140 mg/dL      Peak postprandial:   less than 180 mg/dL (1-2 hours)      Critically ill patients:  140 - 180 mg/dL   Lab Results  Component Value Date   GLUCAP 129 (H) 08/25/2023   HGBA1C 8.9 (H) 08/08/2023    Discharge Recommendations: Other recommendations: Tradjenta 5 mg QD Supply/Referral recommendations: Glucometer Test strips Lancet device Lancets   Use Adult Diabetes Insulin Treatment Post Discharge order set.   Review of Glycemic Control   Latest Reference Range & Units 08/24/23 06:23 08/24/23 11:59 08/24/23 16:32 08/24/23 20:42 08/25/23 06:11 08/25/23 12:13  Glucose-Capillary 70 - 99 mg/dL 98 147 (H) 829 (H) 562 (H) 102 (H) 129 (H)  (H): Data is abnormally high   Diabetes history: DM Outpatient Diabetes medications: None Current orders for Inpatient glycemic control: Tradjenta 5 mg every day, Prednisone taper, Novolog 0-6 units TID   Referral received for A1C of 8.9%; new DM onset.  Our team spoke with him on 08/13/22.  At that time it was recommended to discharge on Tradjenta 5 mg every day and check his glucose 1-2 x a day while on steroids.  Ordered the Living Well with Diabetes booklet.  Met with patient at bedside.  He was started on high dose steroids 2 months ago and has been tapering since.  A1C is 8.9%.  Reviewed this with patient, Normal glucose levels, impact of glucose while on steroids, Check CBG fasting and 2-3 hrs after lunch.  If CBG's are consistently > 150 mg/dL call his PCP.  Encouraged him to avoid caloric beverages and be mindful of carbohydrate consumption.  He verbalizes undersatnding.    Will continue to follow while inpatient.   Thank you, Dulce Sellar, MSN, CDCES Diabetes Coordinator Inpatient Diabetes Program [phone] (team pager from 8a-5p)

## 2023-08-25 NOTE — Progress Notes (Signed)
PROGRESS NOTE   Subjective/Complaints: Pt reports LBM yesterday Pain stable- not worse, not better Wants to leave today, but explained unable to since getting family ed today.   ROS:  Pt denies SOB, abd pain, CP, N/V/C/D, and vision changes   Negative except for HPI  Objective:   No results found. No results for input(s): "WBC", "HGB", "HCT", "PLT" in the last 72 hours.  No results for input(s): "NA", "K", "CL", "CO2", "GLUCOSE", "BUN", "CREATININE", "CALCIUM" in the last 72 hours.   Intake/Output Summary (Last 24 hours) at 08/25/2023 1125 Last data filed at 08/25/2023 0726 Gross per 24 hour  Intake 1632 ml  Output --  Net 1632 ml        Physical Exam: Vital Signs Blood pressure (!) 116/97, pulse 95, temperature 97.9 F (36.6 C), resp. rate 17, height 6\' 2"  (1.88 m), weight 105.5 kg, SpO2 97%.     General: awake, but woke him up- in bed supine; NAD HENT: conjugate gaze; oropharynx moist CV: regular rate and rhythm- rate 90's; no JVD Pulmonary: CTA B/L; no W/R/R- good air movement GI: soft, NT, ND, (+)BS Psychiatric: appropriate- gruff Neurological: Ox3   Musculoskeletal:     Cervical back: Neck supple. No tenderness.     Comments: 5-/5 in Ue's and LE's- throughout all muscles-   Skin:    General: Skin is warm and dry.     Comments: Ecchymoses on arms B/L   Neurological:     Mental Status: He is alert.     Comments: .  Patient is alert.  Follows simple commands.  Makes eye contact with examiner.  Cooperative with exam.  Provides name and age.  Limited with fair medical historian. Decreased to light touch from knees down- but also c/o nerve pain in arms and legs , stable 1/19  Assessment/Plan: 1. Functional deficits which require 3+ hours per day of interdisciplinary therapy in a comprehensive inpatient rehab setting. Physiatrist is providing close team supervision and 24 hour management of active  medical problems listed below. Physiatrist and rehab team continue to assess barriers to discharge/monitor patient progress toward functional and medical goals  Care Tool:  Bathing    Body parts bathed by patient: Right arm, Left upper leg, Right lower leg, Left arm, Chest, Left lower leg, Abdomen, Front perineal area, Buttocks, Right upper leg, Face         Bathing assist Assist Level: Supervision/Verbal cueing     Upper Body Dressing/Undressing Upper body dressing   What is the patient wearing?: Pull over shirt    Upper body assist Assist Level: Set up assist    Lower Body Dressing/Undressing Lower body dressing      What is the patient wearing?: Pants, Underwear/pull up     Lower body assist Assist for lower body dressing: Set up assist     Toileting Toileting    Toileting assist Assist for toileting: Supervision/Verbal cueing     Transfers Chair/bed transfer  Transfers assist     Chair/bed transfer assist level: Supervision/Verbal cueing     Locomotion Ambulation   Ambulation assist      Assist level: Contact Guard/Touching assist Assistive device: No Device Max distance:  90 ft   Walk 10 feet activity   Assist     Assist level: Supervision/Verbal cueing Assistive device: No Device   Walk 50 feet activity   Assist    Assist level: Supervision/Verbal cueing Assistive device: No Device    Walk 150 feet activity   Assist    Assist level: Contact Guard/Touching assist Assistive device: No Device    Walk 10 feet on uneven surface  activity   Assist     Assist level: Contact Guard/Touching assist Assistive device: Other (comment) (None)   Wheelchair     Assist Is the patient using a wheelchair?: No (refused use of w/c) Type of Wheelchair: Manual Wheelchair activity did not occur: Refused         Wheelchair 50 feet with 2 turns activity    Assist    Wheelchair 50 feet with 2 turns activity did not occur:  Refused       Wheelchair 150 feet activity     Assist  Wheelchair 150 feet activity did not occur: Refused       Blood pressure (!) 116/97, pulse 95, temperature 97.9 F (36.6 C), resp. rate 17, height 6\' 2"  (1.88 m), weight 105.5 kg, SpO2 97%.  Medical Problem List and Plan: 1. 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  Completed 5-day course of IVIG 08/12/2023.  Prednisone taper as directed.  Follow-up outpatient neurology services Dr. Epimenio Foot             -patient may  shower             -ELOS/Goals: ~2 weeks- Mod I           Con't CIR PT and OT  Pt wants to go home Monday afternoon- will check with staff, but he has family training Monday  Con't CIR PT and OT= family training today  D/c tomorrow 2.  Antithrombotics: -DVT/anticoagulation:  Pharmaceutical: Heparin  1/16- will change SQ Heparin to Lovenox so less shots 1/20- will not go home on Lovenox/Eliquis             -antiplatelet therapy: N/A 3. Pain Management: Cymbalta 60 mg daily, tramadol/hydrocodone as needed             1/15- will increase Cymbalta to 120 mg daily- monitor Na- and see if helps nerve pain- allergic to gabapentin and Lyrica due to swelling that was severe.  4. Mood/Behavior/Sleep: Pamelor 50 mg nightly             -antipsychotic agents:  5. Neuropsych/cognition: This patient is capable of making decisions on his own behalf. 6. Skin/Wound Care: Routine skin checks 7. Fluids/Electrolytes/Nutrition: Routine in and outs with follow-up chemistries 8.  Alcohol abuse/withdrawal.  Patient required Precedex and phenobarb during hospital stay that is since been discontinued.  Provide counseling             1/15- Thiamine going to PO route, not IV anymore.  9.  SVT/atrial tachycardia.  Cardizem 120 mg daily.  Cardiac rate controlled.  Follow-up outpatient cardiology services 10.  New onset diabetes mellitus.  Hemoglobin A1c 8.9.  Tradjenta 5 mg daily.  No current plan for home insulin              1/15-1/16 CBGs looking good overall- con't regimen  1/17- somewhat labile, but all less than 200- con't regimen for now  1/20- will consult DM educator/coordinator for new onset DM A1c 8.9 11.  Hyponatremia.  Felt to be induced by hyperglycemia.  Follow-up chemistries  1/15 - Na 135 today- doing better  Repeat Na on Monday  12.  Tobacco abuse.  Provide counseling.  continue NicoDerm patch  13.  History of seizure disorder: continue Trileptal 300 mg daily and 600 mg nightly.Lamictal has been discontinued             1/15- pt asking to decrease Trileptal- said it makes him talk "crazy"- and his balance is off. Na 135- so doing better- Will call Dr Epimenio Foot and see if can decrease Trileptal at all?  14.  History of gouty arthritis.  Continue Uloric 40 mg daily  15. Hypertension: amlodipine 2.89m daily started  1/20- BP doing a little better- only 1 value >140/90- con't regimen  I spent a total of 35   minutes on total care today- >50% coordination of care- due to  D./w nursing about overnight- also referral/consult to DM coordinator to help with education       LOS: 6 days A FACE TO FACE EVALUATION WAS PERFORMED  Avigayil Ton 08/25/2023, 11:25 AM

## 2023-08-25 NOTE — Progress Notes (Signed)
Physical Therapy Discharge Summary  Patient Details  Name: Connor Copeland MRN: [id number] Date of Birth: 04/18/1976  Date of Discharge from PT service:August 25, 2023  Today's Date: 08/25/2023 PT Individual Time: 4098-1191, 1400-1445 PT Individual Time Calculation (min): 48 min, 45 min   Patient has met 10 of 10 long term goals due to improved activity tolerance, improved balance, improved postural control, and increased strength.  Patient to discharge at an ambulatory level Modified Independent.   Patient's care partner is independent to provide the necessary physical assistance at discharge. Pt's mom participated in hands on family training and provided appropriate supervision.   Reasons goals not met: N/A  Recommendation:  Patient will benefit from ongoing skilled PT services in outpatient setting to continue to advance safe functional mobility, address ongoing impairments in activity tolerance and endurance, balance, LE strengthening, and minimize fall risk.  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 Almost constantly Pain Interference with Therapy Activities: 2. Occasionally Pain Interference with Day-to-Day Activities: 1. Rarely or not at all Vision/Perception  Vision - History Ability to See in Adequate Light: 1 Impaired Perception Perception: Within Functional Limits Praxis Praxis: WFL  Cognition Overall  Cognitive Status: Within Functional Limits for tasks assessed Arousal/Alertness: Awake/alert Orientation Level: Oriented X4 Attention: Focused;Sustained;Selective Focused Attention: Appears intact Sustained Attention: Appears intact Selective Attention: Appears intact Memory: Appears intact Awareness: Appears intact Problem Solving: Appears intact Safety/Judgment: Appears intact Sensation Sensation Light Touch: Impaired by gross assessment Peripheral sensation comments: Pt reports impaired sensation in B UE and LE during functional tasks Light Touch Impaired Details: Impaired RUE;Impaired LUE;Impaired RLE;Impaired LLE Proprioception: Impaired by gross assessment Proprioception Impaired Details: Impaired RLE;Impaired LLE Stereognosis: Appears Intact Coordination Gross Motor Movements are Fluid and Coordinated: Yes Fine Motor Movements are Fluid and Coordinated: Yes Motor  Motor Motor: Within Functional Limits Motor - Skilled Clinical Observations: generalized debility; elevated HR with activity and at rest  Mobility Bed Mobility Bed Mobility: Rolling Right;Rolling Left;Supine to Sit;Sit to Supine Rolling Right: Independent with assistive device Rolling Left: Independent with assistive device Supine to Sit: Independent Sit to Supine: Independent Transfers Transfers: Stand to Sit;Sit to Stand Sit to Stand: Independent Stand to Sit: Independent Stand Pivot Transfers: Independent Transfer (Assistive device): None Locomotion  Gait Ambulation: Yes Gait Assistance: Independent with assistive device Gait Distance (Feet): 150 Feet Assistive device: Rollator Gait Gait: Yes Gait Pattern: Decreased step length - left;Decreased step length - right;Wide base of support Stairs / Additional Locomotion Stairs: Yes Stairs Assistance: Supervision/Verbal cueing Stair Management Technique: One rail Right Number of Stairs: 12 Height of Stairs: 6 Ramp: Supervision/Verbal cueing Curb:  Supervision/Verbal cueing Pick up small object from the floor assist level: Independent with assistive device Pick up small object from the floor assistive device: increased time necessary Wheelchair Mobility Wheelchair Mobility: No  Trunk/Postural Assessment  Cervical Assessment Cervical Assessment: Within Functional Limits Thoracic Assessment Thoracic Assessment: Within Functional Limits Lumbar Assessment Lumbar Assessment: Within Functional Limits Postural Control Postural Control: Within Functional Limits  Balance Balance Balance Assessed: Yes Static Sitting  Balance Static Sitting - Balance Support: Feet supported Static Sitting - Level of Assistance: 7: Independent Dynamic Sitting Balance Dynamic Sitting - Balance Support: Feet supported;During functional activity Dynamic Sitting - Level of Assistance: 7: Independent Dynamic Sitting - Balance Activities: Forward lean/weight shifting;Reaching for objects Static Standing Balance Static Standing - Balance Support: During functional activity Static Standing - Level of Assistance: 7: Independent Dynamic Standing Balance Dynamic Standing - Balance Support: During functional activity Dynamic Standing - Level of Assistance: 6: Modified independent (Device/Increase time) Dynamic Standing - Balance Activities: Reaching for weighted objects Extremity Assessment  RUE Assessment RUE Assessment: Within Functional Limits LUE Assessment LUE Assessment: Within Functional Limits RLE Assessment RLE Assessment: Within Functional Limits General Strength Comments: improved muscular endurance with functional mobility compared to eval LLE Assessment LLE Assessment: Within Functional Limits General Strength Comments: improved muscular endurance with functional mobility compared to eval   Skilled Therapeutic Interventions/Progress Updates Session 1: Family Education Pt sleeping but easily awoken and agreeable to family education PT session.  Pt reports 9/10 pain but states this is normal for him and no intervention required. Pt's mother present for session. Discussed functional progress made and anticipated needs at discharge.   Pt ambulated with rollator to ortho gym with mod(I). Pt folded up rollator and put in simulated trunk of car, and had one minor LOB backwards due to lip of ramp requiring CGA for correction. Discussed folding up rollator closer to car to avoid having to carry it, and scanning for environmental obstacles. Pt completed car-transfer mod(I).   Pt ambulated to therapy gym and completed 12 steps with R hand rail, step through gait pattern, and with mod(I). PT demonstrated rollator sequencing for curb step and pt completed 8" curb step with an without rollator with SBA. Without rollator, pt demonstrated excessive forward flexion at trunk likely d/t functional glute weakness.   Pt performed stand<>prone on floor mat and prone<>seated on mat table with SBA, using mat table for UE support and swinging hips onto table for seated. Pt rated difficulty of floor transfer at 9/10, but felt confident performing it. Discussed continuing to use stable surface for UE support with floor transfers as well as when pt should or should not call for EMS after a fall.   Pt ambulated back to room with mod(I) and rollator and pt and pt's mom denied any further questions about pt's PT care. Pt left seated in bed with all needs in reach.    Session 2:  Pt received supine in bed and agreeable to therapy session focusing on community ambulation. Pt reports 9/10 pain but states this is usual for him and no intervention required. Pt ambulated 20 minutes with mod(I) down to lobby, outside, and around gift shop without any seated rest breaks. Pt successfully navigated environmental obstacles and dual tasking while shopping for pillbox in gift-shop.   Pt ambulated to therapy gym with rollator and supervision. Pt performed x2 box squats from regular mat  height, and 2 sets of x8 from mat table +airex height with SBA and RPE of 8-9/10, for LE strengthening to allow for increased muscular endurance.   Pt ambulated back to room with rollator and mod(I). Pt left seated in bed with all needs in reach.  [name] 08/25/2023, 4:08 PM

## 2023-08-26 DIAGNOSIS — G6181 Chronic inflammatory demyelinating polyneuritis: Secondary | ICD-10-CM | POA: Diagnosis not present

## 2023-08-26 LAB — GLUCOSE, CAPILLARY: Glucose-Capillary: 114 mg/dL — ABNORMAL HIGH (ref 70–99)

## 2023-08-26 NOTE — Progress Notes (Signed)
Patient discharged to home accompanied by his mother. 

## 2023-08-26 NOTE — Progress Notes (Signed)
PROGRESS NOTE   Subjective/Complaints:  Pt reports wants to increase Norco- I explained Norco doesn't treat nerve pain- it takes edge off,but it doesn't actually reduce the pain scale dramatically- I will leave this to PCP to titrate   ROS:   Pt denies SOB, abd pain, CP, N/V/C/D, and vision changes  Negative except for HPI  Objective:   No results found. No results for input(s): "WBC", "HGB", "HCT", "PLT" in the last 72 hours.  No results for input(s): "NA", "K", "CL", "CO2", "GLUCOSE", "BUN", "CREATININE", "CALCIUM" in the last 72 hours.   Intake/Output Summary (Last 24 hours) at 08/26/2023 0816 Last data filed at 08/25/2023 1816 Gross per 24 hour  Intake 358 ml  Output --  Net 358 ml        Physical Exam: Vital Signs Blood pressure 127/89, pulse 89, temperature 97.7 F (36.5 C), resp. rate 18, height 6\' 2"  (1.88 m), weight 105.5 kg, SpO2 99%.      General: awake, alert, appropriate,  woke pt up; supine in bed; NAD HENT: conjugate gaze; oropharynx moist CV: regular rate and rhythm; no JVD Pulmonary: CTA B/L; no W/R/R- good air movement GI: soft, NT, ND, (+)BS Psychiatric: appropriate- quiet Neurological: Ox3   Musculoskeletal:     Cervical back: Neck supple. No tenderness.     Comments: 5-/5 in Ue's and LE's- throughout all muscles-   Skin:    General: Skin is warm and dry.     Comments: Ecchymoses on arms B/L   Neurological:     Mental Status: He is alert.     Comments: .  Patient is alert.  Follows simple commands.  Makes eye contact with examiner.  Cooperative with exam.  Provides name and age.  Limited with fair medical historian. Decreased to light touch from knees down- but also c/o nerve pain in arms and legs , stable 1/19  Assessment/Plan: 1. Functional deficits which require 3+ hours per day of interdisciplinary therapy in a comprehensive inpatient rehab setting. Physiatrist is providing  close team supervision and 24 hour management of active medical problems listed below. Physiatrist and rehab team continue to assess barriers to discharge/monitor patient progress toward functional and medical goals  Care Tool:  Bathing    Body parts bathed by patient: Right arm, Left upper leg, Right lower leg, Left arm, Chest, Left lower leg, Abdomen, Front perineal area, Buttocks, Right upper leg, Face         Bathing assist Assist Level: Independent with assistive device     Upper Body Dressing/Undressing Upper body dressing   What is the patient wearing?: Pull over shirt    Upper body assist Assist Level: Independent    Lower Body Dressing/Undressing Lower body dressing      What is the patient wearing?: Pants, Underwear/pull up     Lower body assist Assist for lower body dressing: Independent with assitive device     Toileting Toileting    Toileting assist Assist for toileting: Independent with assistive device     Transfers Chair/bed transfer  Transfers assist     Chair/bed transfer assist level: Independent with assistive device     Locomotion Ambulation   Ambulation assist  Assist level: Independent with assistive device Assistive device: Rollator Max distance: >1078ft   Walk 10 feet activity   Assist     Assist level: Independent with assistive device Assistive device: Rollator   Walk 50 feet activity   Assist    Assist level: Independent with assistive device Assistive device: Rollator    Walk 150 feet activity   Assist    Assist level: Independent with assistive device Assistive device: Rollator    Walk 10 feet on uneven surface  activity   Assist     Assist level: Independent with assistive device Assistive device: Rollator   Wheelchair     Assist Is the patient using a wheelchair?: No Type of Wheelchair: Manual Wheelchair activity did not occur: N/A         Wheelchair 50 feet with 2 turns  activity    Assist    Wheelchair 50 feet with 2 turns activity did not occur: N/A       Wheelchair 150 feet activity     Assist  Wheelchair 150 feet activity did not occur: N/A       Blood pressure 127/89, pulse 89, temperature 97.7 F (36.5 C), resp. rate 18, height 6\' 2"  (1.88 m), weight 105.5 kg, SpO2 99%.  Medical Problem List and Plan: 1. 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  Completed 5-day course of IVIG 08/12/2023.  Prednisone taper as directed.  Follow-up outpatient neurology services Dr. Epimenio Foot             -patient may  shower             -ELOS/Goals: ~2 weeks- Mod I           Con't CIR PT and OT  Pt wants to go home Monday afternoon- will check with staff, but he has family training Monday  D/c today- will go home with list on schedule of meds 2.  Antithrombotics: -DVT/anticoagulation:  Pharmaceutical: Heparin  1/16- will change SQ Heparin to Lovenox so less shots 1/20- will not go home on Lovenox/Eliquis             -antiplatelet therapy: N/A 3. Pain Management: Cymbalta 60 mg daily, tramadol/hydrocodone as needed             1/15- will increase Cymbalta to 120 mg daily- monitor Na- and see if helps nerve pain- allergic to gabapentin and Lyrica due to swelling that was severe.  4. Mood/Behavior/Sleep: Pamelor 50 mg nightly             -antipsychotic agents:  5. Neuropsych/cognition: This patient is capable of making decisions on his own behalf. 6. Skin/Wound Care: Routine skin checks 7. Fluids/Electrolytes/Nutrition: Routine in and outs with follow-up chemistries 8.  Alcohol abuse/withdrawal.  Patient required Precedex and phenobarb during hospital stay that is since been discontinued.  Provide counseling             1/15- Thiamine going to PO route, not IV anymore.  9.  SVT/atrial tachycardia.  Cardizem 120 mg daily.  Cardiac rate controlled.  Follow-up outpatient cardiology services 10.  New onset diabetes mellitus.  Hemoglobin A1c 8.9.   Tradjenta 5 mg daily.  No current plan for home insulin             1/15-1/16 CBGs looking good overall- con't regimen  1/17- somewhat labile, but all less than 200- con't regimen for now  1/20- will consult DM educator/coordinator for new onset DM A1c 8.9  1/21- sending home on Tradjenta 5  mg and to check CBG's at home. Asked nursing to teach BG checks.  11.  Hyponatremia.  Felt to be induced by hyperglycemia.  Follow-up chemistries             1/15 - Na 135 today- doing better  Repeat Na on Monday  12.  Tobacco abuse.  Provide counseling.  continue NicoDerm patch  13.  History of seizure disorder: continue Trileptal 300 mg daily and 600 mg nightly.Lamictal has been discontinued             1/15- pt asking to decrease Trileptal- said it makes him talk "crazy"- and his balance is off. Na 135- so doing better- Will call Dr Epimenio Foot and see if can decrease Trileptal at all?  14.  History of gouty arthritis.  Continue Uloric 40 mg daily  15. Hypertension: amlodipine 2.8m daily started  1/20- BP doing a little better- only 1 value >140/90- con't regimen  1/21- BP looking much better - send home on new regimen   The patient is medically ready for discharge to home and will not need follow-up with [facility]. In addition, they will need to follow up with their PCP, Neurology- doesn't need rehab f/u since strength 5-/5 to 5/5 at this time.  Will need PCP to manage pain meds. .    LOS: 7 days A FACE TO FACE EVALUATION WAS PERFORMED  Rashel Okeefe 08/26/2023, 8:16 AM

## 2023-08-26 NOTE — Progress Notes (Signed)
Inpatient Rehabilitation Discharge Medication Review by a Pharmacist  A complete drug regimen review was completed for this patient to identify any potential clinically significant medication issues.  High Risk Drug Classes Is patient taking? Indication by Medication  Antipsychotic No   Anticoagulant No   Antibiotic No   Opioid Yes PRN Norco - severe pain  Antiplatelet No   Hypoglycemics/insulin Yes Linagliptin - DM  Vasoactive Medication Yes Diltiazem - SVT Amlodipine - hypertension  Chemotherapy No   Other Yes Trileptal - seizure prophylaxis Cymbalta - neuropathic pain Pamelor  - mood stabilization, sleep Uloric daily/prednisone taper - gout Magnesium oxide, thiamine, folic acid, MVI - supplements Protonix - GERD Nicotine patch with taper - smoking cessation  PRNs: Acetaminophen - mild pain or fever  Flonase- congestion      Type of Medication Issue Identified Description of Issue Recommendation(s)  Drug Interaction(s) (clinically significant)     Duplicate Therapy     Allergy     No Medication Administration End Date     Incorrect Dose     Additional Drug Therapy Needed     Significant med changes from prior encounter (inform family/care partners about these prior to discharge). Cymbalta dose increased. Prior Lamotrigine, KCl and PRN Tramadol discontinued. New Amlodipine, MVI, thiamine. Communicate changes with patient/family prior to discharge.  Other       Clinically significant medication issues were identified that warrant physician communication and completion of prescribed/recommended actions by midnight of the next day:  No  Pharmacist comments:   - has been on potassium supplement and PRN Tramadol while inpatient. Confirmed that neither are to continue at discharge w/ D. Anguilli, PA-C.  Time spent performing this drug regimen review (minutes):  20   Dennie Fetters, [place] 08/26/2023 8:45 AM

## 2023-08-27 NOTE — Progress Notes (Signed)
Inpatient Rehabilitation Care Coordinator Discharge Note   Patient Details  Name: Connor Copeland MRN: [id number] Date of Birth: 10-Mar-1976   Discharge location: D/c to home with PRN support from his mother  Length of Stay: 6 days  Discharge activity level: Mod I  Home/community participation: Limited  Patient response GE:XBMWUX Literacy - How often do you need to have someone help you when you read instructions, pamphlets, or other written material from your doctor or pharmacy?: Never  Patient response LK:GMWNUU Isolation - How often do you feel lonely or isolated from those around you?: Never  Services provided included: MD, RD, PT, OT, SLP, RN, CM, TR, Pharmacy, Neuropsych, SW  Financial Services:  Field seismologist Utilized: Private Insurance [facility]  Choices offered to/list presented to: patient  Follow-up services arranged:  Outpatient, DME    Outpatient Servicies: [name] for PT/OT DME : [employer] [facility] for rollator    Patient response to transportation need: Is the patient able to respond to transportation needs?: Yes In the past 12 months, has lack of transportation kept you from medical appointments or from getting medications?: No In the past 12 months, has lack of transportation kept you from meetings, work, or from getting things needed for daily living?: No   Patient/Family verbalized understanding of follow-up arrangements:  Yes  Individual responsible for coordination of the follow-up plan: contact pt [phone]  Confirmed correct DME delivered: Connor Copeland 08/27/2023    Comments (or additional information):fam edu completed  Summary of Stay    Date/Time Discharge Planning CSW  08/22/23 1056 Pt will discharge to home alone with PRN support from his mother. Outpatient at [facility] for PT/OT. Fam edu on Monday (1/21) with pt mother. SW will confirm there are no barriers to discharge. AAC       Keiara Sneeringer A Lula Olszewski

## 2023-09-04 ENCOUNTER — Telehealth: Payer: Self-pay | Admitting: Neurology

## 2023-09-04 NOTE — Telephone Encounter (Signed)
Macy RN Case Manager w/ [facility] is asking for a call to discuss pt's plan of care and his pain regimen

## 2023-09-04 NOTE — Telephone Encounter (Signed)
Faxed urgent referral to [facility]. Pt had IVIG in the hospital 08/08/23-08/12/23. Fax# [phone]. Received fax confirmation.  He was at [facility] 08/08/23-08/19/23. Dr. Epimenio Foot would like to get him in for a f/u in March/April 2025. I called pt. Scheduled follow up for 10/13/23 a 2:30pm. He had no questions/concerns at this time. Encouraged him to call back if he does. Aware [facility] will be getting IVIG approved through insurance first and then will schedule him for home infusions.   Called [employer] back to speak with case manager. Had to LVM asking for call back.

## 2023-09-04 NOTE — Telephone Encounter (Signed)
Spoke w/ Dr. Epimenio Foot and intrafusion on this pt. He had transportation issues and could not do IVIG w/ intrafusion. We will try and get him set up w/ [facility] for home infusion.

## 2023-09-05 ENCOUNTER — Telehealth: Payer: Self-pay | Admitting: Neurology

## 2023-09-05 NOTE — Telephone Encounter (Signed)
Per [place], "We received it all and are pushing it through prior auth now."

## 2023-09-05 NOTE — Telephone Encounter (Signed)
Faxed EMG/NCS results to fax below, received fax confirmation. I also received email from [place]. I emailed her back letting her know this has been faxed and if they do not receive, to let us know.

## 2023-09-05 NOTE — Telephone Encounter (Signed)
Sandy @[facility]  has called with a urgent request for the test and or results to pt's NCV/EMG re: approval for pt's IVIG.  Andrey Campanile is asking if this can be faxed to them by 11am to [phone]

## 2023-09-08 NOTE — Telephone Encounter (Signed)
Faxed [facility] [phone] 09/08/2023

## 2023-09-18 ENCOUNTER — Ambulatory Visit: Payer: 59 | Admitting: Student

## 2023-09-18 NOTE — Progress Notes (Deleted)
   Cardiology Office Note    Date:  09/18/2023  ID:  Connor Copeland, DOB Oct 28, 1975, MRN [id number] Cardiologist: Marjo Bicker, MD    History of Present Illness:    Connor Copeland is a 48 y.o. male with past medical history of chronic inflammatory demyelinating polyneuropathy, alcohol use, seizure disorder and gout who presents to the office today for hospital follow-up.  He was most recently admitted to [facility] in 08/2023 for acute CIDP flare and was started on IVIG Ig along with Oxcarbazepine. Was also noted to have DT's during admission and started on Precedex drip and Phenobarbital. Cardiology was consulted during admission as he did have episodes of SVT with heart rate in the 150's at times.  This was in the setting of multiple electrolyte abnormalities as well. Was started on short-acting Cardizem 30 mg every 6 hours and this was consolidated to Cardizem CD 120 mg daily.  He was admitted to CIR at [facility] for further rehabilitation and was discharged on 08/26/2023.  - stop Amlodipine! - CBC, BMET, Mg   ROS: ***  Studies Reviewed:   EKG: EKG is*** ordered today and demonstrates ***   EKG Interpretation Date/Time:    Ventricular Rate:    PR Interval:    QRS Duration:    QT Interval:    QTC Calculation:   R Axis:      Text Interpretation:         Echocardiogram: 08/2023 IMPRESSIONS     1. Left ventricular ejection fraction, by estimation, is 60 to 65%. The  left ventricle has normal function. The left ventricle has no regional  wall motion abnormalities. There is mild left ventricular hypertrophy.  Left ventricular diastolic parameters  are consistent with Grade I diastolic dysfunction (impaired relaxation).   2. Right ventricular systolic function is normal. The right ventricular  size is normal.   3. The mitral valve is normal in structure. No evidence of mitral valve  regurgitation. No evidence of mitral stenosis.   4. The aortic valve  is tricuspid. Aortic valve regurgitation is not  visualized. No aortic stenosis is present.    Risk Assessment/Calculations:   {Does this patient have ATRIAL FIBRILLATION?[phone]} No BP recorded.  {Refresh Note OR Click here to enter BP  :1}***         Physical Exam:   VS:  There were no vitals taken for this visit.   Wt Readings from Last 3 Encounters:  08/19/23 232 lb 9.4 oz (105.5 kg)  08/19/23 231 lb 11.3 oz (105.1 kg)  08/04/23 245 lb (111.1 kg)     GEN: Well nourished, well developed in no acute distress NECK: No JVD; No carotid bruits CARDIAC: ***RRR, no murmurs, rubs, gallops RESPIRATORY:  Clear to auscultation without rales, wheezing or rhonchi  ABDOMEN: Appears non-distended. No obvious abdominal masses. EXTREMITIES: No clubbing or cyanosis. No edema.  Distal pedal pulses are 2+ bilaterally.   Assessment and Plan:   1. SVT - ***  2. CAD - Calcium score was elevated at 158 in 11/2022.  Previously recommended to start statin therapy but he declined and LDL was at 36 when checked in 08/2023.  3. CIDP - ***  Signed, Ellsworth Lennox, PA-C

## 2023-09-22 ENCOUNTER — Telehealth: Payer: Self-pay | Admitting: *Deleted

## 2023-09-22 NOTE — Telephone Encounter (Addendum)
 Marland Kitchen

## 2023-09-22 NOTE — Telephone Encounter (Signed)
 Marland Kitchen

## 2023-10-01 ENCOUNTER — Telehealth: Payer: Self-pay | Admitting: *Deleted

## 2023-10-01 NOTE — Telephone Encounter (Signed)
 Connor Copeland

## 2023-10-02 ENCOUNTER — Inpatient Hospital Stay (HOSPITAL_COMMUNITY): Payer: 59

## 2023-10-02 ENCOUNTER — Encounter (HOSPITAL_COMMUNITY): Payer: Self-pay

## 2023-10-02 ENCOUNTER — Emergency Department (HOSPITAL_COMMUNITY): Payer: 59

## 2023-10-02 ENCOUNTER — Other Ambulatory Visit: Payer: Self-pay

## 2023-10-02 ENCOUNTER — Inpatient Hospital Stay (HOSPITAL_COMMUNITY)
Admission: EM | Admit: 2023-10-02 | Discharge: 2023-10-17 | DRG: 896 | Disposition: A | Discharge status: Home Health | Payer: 59 | Attending: Internal Medicine | Admitting: Internal Medicine

## 2023-10-02 DIAGNOSIS — E8729 Other acidosis: Secondary | ICD-10-CM | POA: Diagnosis present

## 2023-10-02 DIAGNOSIS — E871 Hypo-osmolality and hyponatremia: Secondary | ICD-10-CM | POA: Diagnosis present

## 2023-10-02 DIAGNOSIS — E8721 Acute metabolic acidosis: Secondary | ICD-10-CM | POA: Diagnosis not present

## 2023-10-02 DIAGNOSIS — S51802A Unspecified open wound of left forearm, initial encounter: Secondary | ICD-10-CM | POA: Diagnosis present

## 2023-10-02 DIAGNOSIS — R451 Restlessness and agitation: Secondary | ICD-10-CM | POA: Diagnosis not present

## 2023-10-02 DIAGNOSIS — E11649 Type 2 diabetes mellitus with hypoglycemia without coma: Secondary | ICD-10-CM | POA: Diagnosis not present

## 2023-10-02 DIAGNOSIS — I5032 Chronic diastolic (congestive) heart failure: Secondary | ICD-10-CM | POA: Diagnosis present

## 2023-10-02 DIAGNOSIS — S8002XA Contusion of left knee, initial encounter: Secondary | ICD-10-CM | POA: Diagnosis present

## 2023-10-02 DIAGNOSIS — D649 Anemia, unspecified: Secondary | ICD-10-CM | POA: Diagnosis present

## 2023-10-02 DIAGNOSIS — R627 Adult failure to thrive: Secondary | ICD-10-CM | POA: Diagnosis present

## 2023-10-02 DIAGNOSIS — E876 Hypokalemia: Secondary | ICD-10-CM | POA: Diagnosis not present

## 2023-10-02 DIAGNOSIS — S41102A Unspecified open wound of left upper arm, initial encounter: Secondary | ICD-10-CM | POA: Diagnosis present

## 2023-10-02 DIAGNOSIS — M19011 Primary osteoarthritis, right shoulder: Secondary | ICD-10-CM | POA: Diagnosis present

## 2023-10-02 DIAGNOSIS — R7401 Elevation of levels of liver transaminase levels: Secondary | ICD-10-CM | POA: Diagnosis present

## 2023-10-02 DIAGNOSIS — I110 Hypertensive heart disease with heart failure: Secondary | ICD-10-CM | POA: Diagnosis present

## 2023-10-02 DIAGNOSIS — Z888 Allergy status to other drugs, medicaments and biological substances status: Secondary | ICD-10-CM

## 2023-10-02 DIAGNOSIS — G6181 Chronic inflammatory demyelinating polyneuritis: Secondary | ICD-10-CM | POA: Diagnosis present

## 2023-10-02 DIAGNOSIS — E860 Dehydration: Secondary | ICD-10-CM | POA: Diagnosis present

## 2023-10-02 DIAGNOSIS — E8809 Other disorders of plasma-protein metabolism, not elsewhere classified: Secondary | ICD-10-CM | POA: Diagnosis present

## 2023-10-02 DIAGNOSIS — Z7984 Long term (current) use of oral hypoglycemic drugs: Secondary | ICD-10-CM

## 2023-10-02 DIAGNOSIS — D539 Nutritional anemia, unspecified: Secondary | ICD-10-CM | POA: Diagnosis present

## 2023-10-02 DIAGNOSIS — F17210 Nicotine dependence, cigarettes, uncomplicated: Secondary | ICD-10-CM | POA: Diagnosis present

## 2023-10-02 DIAGNOSIS — J690 Pneumonitis due to inhalation of food and vomit: Secondary | ICD-10-CM | POA: Diagnosis not present

## 2023-10-02 DIAGNOSIS — G40909 Epilepsy, unspecified, not intractable, without status epilepticus: Secondary | ICD-10-CM | POA: Diagnosis not present

## 2023-10-02 DIAGNOSIS — G4089 Other seizures: Secondary | ICD-10-CM | POA: Diagnosis present

## 2023-10-02 DIAGNOSIS — E7220 Disorder of urea cycle metabolism, unspecified: Secondary | ICD-10-CM

## 2023-10-02 DIAGNOSIS — E872 Acidosis, unspecified: Secondary | ICD-10-CM | POA: Diagnosis present

## 2023-10-02 DIAGNOSIS — Z23 Encounter for immunization: Secondary | ICD-10-CM | POA: Diagnosis not present

## 2023-10-02 DIAGNOSIS — D181 Lymphangioma, any site: Secondary | ICD-10-CM

## 2023-10-02 DIAGNOSIS — E66811 Obesity, class 1: Secondary | ICD-10-CM | POA: Diagnosis present

## 2023-10-02 DIAGNOSIS — R509 Fever, unspecified: Secondary | ICD-10-CM | POA: Diagnosis not present

## 2023-10-02 DIAGNOSIS — M792 Neuralgia and neuritis, unspecified: Secondary | ICD-10-CM | POA: Diagnosis present

## 2023-10-02 DIAGNOSIS — G9608 Other cranial cerebrospinal fluid leak: Secondary | ICD-10-CM | POA: Diagnosis present

## 2023-10-02 DIAGNOSIS — Z825 Family history of asthma and other chronic lower respiratory diseases: Secondary | ICD-10-CM

## 2023-10-02 DIAGNOSIS — Z781 Physical restraint status: Secondary | ICD-10-CM

## 2023-10-02 DIAGNOSIS — N179 Acute kidney failure, unspecified: Secondary | ICD-10-CM | POA: Diagnosis present

## 2023-10-02 DIAGNOSIS — R609 Edema, unspecified: Secondary | ICD-10-CM | POA: Diagnosis not present

## 2023-10-02 DIAGNOSIS — W19XXXA Unspecified fall, initial encounter: Secondary | ICD-10-CM | POA: Diagnosis present

## 2023-10-02 DIAGNOSIS — F10931 Alcohol use, unspecified with withdrawal delirium: Principal | ICD-10-CM

## 2023-10-02 DIAGNOSIS — Z91148 Patient's other noncompliance with medication regimen for other reason: Secondary | ICD-10-CM

## 2023-10-02 DIAGNOSIS — R197 Diarrhea, unspecified: Secondary | ICD-10-CM | POA: Diagnosis not present

## 2023-10-02 DIAGNOSIS — R4182 Altered mental status, unspecified: Secondary | ICD-10-CM | POA: Diagnosis present

## 2023-10-02 DIAGNOSIS — E43 Unspecified severe protein-calorie malnutrition: Secondary | ICD-10-CM | POA: Diagnosis present

## 2023-10-02 DIAGNOSIS — R17 Unspecified jaundice: Secondary | ICD-10-CM | POA: Diagnosis present

## 2023-10-02 DIAGNOSIS — R7989 Other specified abnormal findings of blood chemistry: Secondary | ICD-10-CM | POA: Diagnosis present

## 2023-10-02 DIAGNOSIS — Z79899 Other long term (current) drug therapy: Secondary | ICD-10-CM

## 2023-10-02 DIAGNOSIS — E1165 Type 2 diabetes mellitus with hyperglycemia: Secondary | ICD-10-CM | POA: Diagnosis present

## 2023-10-02 DIAGNOSIS — R296 Repeated falls: Secondary | ICD-10-CM | POA: Diagnosis present

## 2023-10-02 DIAGNOSIS — M109 Gout, unspecified: Secondary | ICD-10-CM | POA: Diagnosis present

## 2023-10-02 DIAGNOSIS — F39 Unspecified mood [affective] disorder: Secondary | ICD-10-CM | POA: Diagnosis present

## 2023-10-02 DIAGNOSIS — F10231 Alcohol dependence with withdrawal delirium: Principal | ICD-10-CM | POA: Diagnosis present

## 2023-10-02 DIAGNOSIS — E669 Obesity, unspecified: Secondary | ICD-10-CM | POA: Diagnosis present

## 2023-10-02 DIAGNOSIS — L089 Local infection of the skin and subcutaneous tissue, unspecified: Secondary | ICD-10-CM | POA: Diagnosis present

## 2023-10-02 DIAGNOSIS — S8001XA Contusion of right knee, initial encounter: Secondary | ICD-10-CM | POA: Diagnosis present

## 2023-10-02 DIAGNOSIS — E878 Other disorders of electrolyte and fluid balance, not elsewhere classified: Secondary | ICD-10-CM | POA: Diagnosis present

## 2023-10-02 DIAGNOSIS — R443 Hallucinations, unspecified: Secondary | ICD-10-CM | POA: Diagnosis not present

## 2023-10-02 DIAGNOSIS — R569 Unspecified convulsions: Principal | ICD-10-CM

## 2023-10-02 DIAGNOSIS — D72829 Elevated white blood cell count, unspecified: Secondary | ICD-10-CM | POA: Diagnosis not present

## 2023-10-02 DIAGNOSIS — R4587 Impulsiveness: Secondary | ICD-10-CM | POA: Diagnosis present

## 2023-10-02 DIAGNOSIS — Z6838 Body mass index (BMI) 38.0-38.9, adult: Secondary | ICD-10-CM

## 2023-10-02 DIAGNOSIS — F17200 Nicotine dependence, unspecified, uncomplicated: Secondary | ICD-10-CM | POA: Diagnosis present

## 2023-10-02 HISTORY — DX: Chronic inflammatory demyelinating polyneuritis: G61.81

## 2023-10-02 LAB — COMPREHENSIVE METABOLIC PANEL
ALT: 168 U/L — ABNORMAL HIGH (ref 0–44)
AST: 230 U/L — ABNORMAL HIGH (ref 15–41)
Albumin: 2.1 g/dL — ABNORMAL LOW (ref 3.5–5.0)
Alkaline Phosphatase: 477 U/L — ABNORMAL HIGH (ref 38–126)
Anion gap: 18 — ABNORMAL HIGH (ref 5–15)
BUN: 16 mg/dL (ref 6–20)
CO2: 31 mmol/L (ref 22–32)
Calcium: 7.3 mg/dL — ABNORMAL LOW (ref 8.9–10.3)
Chloride: 73 mmol/L — ABNORMAL LOW (ref 98–111)
Creatinine, Ser: 1.26 mg/dL — ABNORMAL HIGH (ref 0.61–1.24)
GFR, Estimated: >60 mL/min (ref >60)
Glucose, Bld: 89 mg/dL (ref 70–99)
Potassium: 3 mmol/L — ABNORMAL LOW (ref 3.5–5.1)
Sodium: 122 mmol/L — ABNORMAL LOW (ref 135–145)
Total Bilirubin: 3.9 mg/dL — ABNORMAL HIGH (ref 0.0–1.2)
Total Protein: 5.9 g/dL — ABNORMAL LOW (ref 6.5–8.1)

## 2023-10-02 LAB — MAGNESIUM: Magnesium: 1.5 mg/dL — ABNORMAL LOW (ref 1.7–2.4)

## 2023-10-02 LAB — CBC
HCT: 32.6 % — ABNORMAL LOW (ref 39.0–52.0)
HCT: 28 % — ABNORMAL LOW (ref 39.0–52.0)
Hemoglobin: 11.1 g/dL — ABNORMAL LOW (ref 13.0–17.0)
Hemoglobin: 9.5 g/dL — ABNORMAL LOW (ref 13.0–17.0)
MCH: 31.2 pg (ref 26.0–34.0)
MCH: 31 pg (ref 26.0–34.0)
MCHC: 34 g/dL (ref 30.0–36.0)
MCHC: 33.9 g/dL (ref 30.0–36.0)
MCV: 91.6 fL (ref 80.0–100.0)
MCV: 91.5 fL (ref 80.0–100.0)
Platelets: 375 10*3/uL (ref 150–400)
Platelets: 323 10*3/uL (ref 150–400)
RBC: 3.56 MIL/uL — ABNORMAL LOW (ref 4.22–5.81)
RBC: 3.06 MIL/uL — ABNORMAL LOW (ref 4.22–5.81)
RDW: 14.5 % (ref 11.5–15.5)
RDW: 14.6 % (ref 11.5–15.5)
WBC: 9.8 10*3/uL (ref 4.0–10.5)
WBC: 7.2 10*3/uL (ref 4.0–10.5)
nRBC: 0.5 % — ABNORMAL HIGH (ref 0.0–0.2)
nRBC: 0.7 % — ABNORMAL HIGH (ref 0.0–0.2)

## 2023-10-02 LAB — BASIC METABOLIC PANEL
Anion gap: 16 — ABNORMAL HIGH (ref 5–15)
BUN: 9 mg/dL (ref 6–20)
CO2: 28 mmol/L (ref 22–32)
Calcium: 6.1 mg/dL — CL (ref 8.9–10.3)
Chloride: 83 mmol/L — ABNORMAL LOW (ref 98–111)
Creatinine, Ser: 1.04 mg/dL (ref 0.61–1.24)
GFR, Estimated: >60 mL/min (ref >60)
Glucose, Bld: 62 mg/dL — ABNORMAL LOW (ref 70–99)
Potassium: 2.3 mmol/L — CL (ref 3.5–5.1)
Sodium: 127 mmol/L — ABNORMAL LOW (ref 135–145)

## 2023-10-02 LAB — ETHANOL: Alcohol, Ethyl (B): <10 mg/dL (ref <10)

## 2023-10-02 LAB — PROTIME-INR
INR: 1.2 (ref 0.8–1.2)
Prothrombin Time: 15.1 s (ref 11.4–15.2)

## 2023-10-02 LAB — GLUCOSE, CAPILLARY
Glucose-Capillary: 156 mg/dL — ABNORMAL HIGH (ref 70–99)
Glucose-Capillary: 65 mg/dL — ABNORMAL LOW (ref 70–99)
Glucose-Capillary: 97 mg/dL (ref 70–99)
Glucose-Capillary: 61 mg/dL — ABNORMAL LOW (ref 70–99)

## 2023-10-02 LAB — MRSA NEXT GEN BY PCR, NASAL: MRSA by PCR Next Gen: NOT DETECTED

## 2023-10-02 LAB — AMMONIA: Ammonia: 43 umol/L — ABNORMAL HIGH (ref 9–35)

## 2023-10-02 LAB — CREATININE, SERUM
Creatinine, Ser: 1.13 mg/dL (ref 0.61–1.24)
GFR, Estimated: >60 mL/min (ref >60)

## 2023-10-02 LAB — LACTIC ACID, PLASMA
Lactic Acid, Venous: 2.1 mmol/L (ref 0.5–1.9)
Lactic Acid, Venous: 1.3 mmol/L (ref 0.5–1.9)

## 2023-10-02 LAB — CBG MONITORING, ED: Glucose-Capillary: 98 mg/dL (ref 70–99)

## 2023-10-02 MED ORDER — ADULT MULTIVITAMIN W/MINERALS CH
1.0000 | ORAL_TABLET | Freq: Every day | ORAL | Status: DC
Start: 2023-10-02 — End: 2023-10-04
  Filled 2023-10-02: qty 1

## 2023-10-02 MED ORDER — LORAZEPAM 2 MG/ML IJ SOLN
2.0000 mg | Freq: Once | INTRAMUSCULAR | Status: AC
  Administered 2023-10-02: 2 mg via INTRAVENOUS
  Filled 2023-10-02: qty 1

## 2023-10-02 MED ORDER — LACTATED RINGERS IV BOLUS
1000.0000 mL | Freq: Once | INTRAVENOUS | Status: AC
  Administered 2023-10-02: 1000 mL via INTRAVENOUS

## 2023-10-02 MED ORDER — LORAZEPAM 1 MG PO TABS: 1.0000 mg | ORAL_TABLET | ORAL | Status: DC | PRN

## 2023-10-02 MED ORDER — PHENOBARBITAL SODIUM 130 MG/ML IJ SOLN
97.5000 mg | Freq: Three times a day (TID) | INTRAMUSCULAR | Status: AC
  Administered 2023-10-02 – 2023-10-04 (×6): 97.5 mg via INTRAVENOUS
  Filled 2023-10-02 (×6): qty 1

## 2023-10-02 MED ORDER — LORAZEPAM 2 MG/ML IJ SOLN
1.0000 mg | INTRAMUSCULAR | Status: DC | PRN
  Administered 2023-10-02 (×2): 2 mg via INTRAVENOUS
  Filled 2023-10-02 (×2): qty 1

## 2023-10-02 MED ORDER — SODIUM CHLORIDE 0.9 % IV SOLN: INTRAVENOUS | Status: DC

## 2023-10-02 MED ORDER — HEPARIN SODIUM (PORCINE) 5000 UNIT/ML IJ SOLN
5000.0000 [IU] | Freq: Three times a day (TID) | INTRAMUSCULAR | Status: DC
  Administered 2023-10-02 – 2023-10-09 (×21): 5000 [IU] via SUBCUTANEOUS
  Filled 2023-10-02 (×21): qty 1

## 2023-10-02 MED ORDER — POTASSIUM CHLORIDE 10 MEQ/100ML IV SOLN
10.0000 meq | INTRAVENOUS | Status: AC
  Administered 2023-10-02 (×3): 10 meq via INTRAVENOUS
  Filled 2023-10-02 (×3): qty 100

## 2023-10-02 MED ORDER — DEXMEDETOMIDINE HCL IN NACL 400 MCG/100ML IV SOLN
0.0000 ug/kg/h | INTRAVENOUS | Status: DC
  Administered 2023-10-02: 0.4 ug/kg/h via INTRAVENOUS
  Administered 2023-10-02: 0.6 ug/kg/h via INTRAVENOUS
  Administered 2023-10-04: 0.2 ug/kg/h via INTRAVENOUS
  Filled 2023-10-02 (×3): qty 100

## 2023-10-02 MED ORDER — POTASSIUM CHLORIDE 10 MEQ/100ML IV SOLN: 10.0000 meq | Freq: Once | INTRAVENOUS | Status: DC

## 2023-10-02 MED ORDER — LORAZEPAM 2 MG/ML IJ SOLN
1.0000 mg | INTRAMUSCULAR | Status: DC | PRN
  Administered 2023-10-04 – 2023-10-07 (×10): 1 mg via INTRAVENOUS
  Filled 2023-10-02 (×10): qty 1

## 2023-10-02 MED ORDER — FOLIC ACID 5 MG/ML IJ SOLN
1.0000 mg | Freq: Once | INTRAMUSCULAR | Status: AC
  Administered 2023-10-02: 1 mg via INTRAVENOUS
  Filled 2023-10-02: qty 0.2

## 2023-10-02 MED ORDER — VANCOMYCIN HCL 2000 MG/400ML IV SOLN
2000.0000 mg | INTRAVENOUS | Status: AC
  Administered 2023-10-02: 2000 mg via INTRAVENOUS
  Filled 2023-10-02: qty 400

## 2023-10-02 MED ORDER — POLYETHYLENE GLYCOL 3350 17 G PO PACK: 17.0000 g | PACK | Freq: Every day | ORAL | Status: DC | PRN

## 2023-10-02 MED ORDER — ORAL CARE MOUTH RINSE: 15.0000 mL | OROMUCOSAL | Status: DC | PRN

## 2023-10-02 MED ORDER — LACTULOSE 10 GM/15ML PO SOLN
10.0000 g | Freq: Every day | ORAL | Status: DC
  Filled 2023-10-02: qty 15

## 2023-10-02 MED ORDER — PHENOBARBITAL SODIUM 130 MG/ML IJ SOLN
260.0000 mg | Freq: Once | INTRAMUSCULAR | Status: DC
  Filled 2023-10-02 (×2): qty 2

## 2023-10-02 MED ORDER — PHENOBARBITAL SODIUM 65 MG/ML IJ SOLN: 32.5000 mg | Freq: Three times a day (TID) | INTRAMUSCULAR | Status: DC

## 2023-10-02 MED ORDER — TETANUS-DIPHTH-ACELL PERTUSSIS 5-2.5-18.5 LF-MCG/0.5 IM SUSY
0.5000 mL | PREFILLED_SYRINGE | Freq: Once | INTRAMUSCULAR | Status: AC
  Administered 2023-10-02: 0.5 mL via INTRAMUSCULAR
  Filled 2023-10-02: qty 0.5

## 2023-10-02 MED ORDER — VANCOMYCIN HCL 750 MG/150ML IV SOLN
750.0000 mg | Freq: Two times a day (BID) | INTRAVENOUS | Status: DC
  Administered 2023-10-03 – 2023-10-04 (×3): 750 mg via INTRAVENOUS
  Filled 2023-10-02 (×3): qty 150

## 2023-10-02 MED ORDER — CHLORHEXIDINE GLUCONATE CLOTH 2 % EX PADS
6.0000 | MEDICATED_PAD | Freq: Every day | CUTANEOUS | Status: DC
Start: 2023-10-02 — End: 2023-10-10
  Administered 2023-10-02 – 2023-10-09 (×8): 6 via TOPICAL

## 2023-10-02 MED ORDER — DEXTROSE 50 % IV SOLN: 12.5000 g | INTRAVENOUS | Status: AC

## 2023-10-02 MED ORDER — LORAZEPAM 2 MG/ML IJ SOLN
1.0000 mg | INTRAMUSCULAR | Status: DC | PRN
  Administered 2023-10-03 – 2023-10-09 (×5): 2 mg via INTRAVENOUS
  Filled 2023-10-02 (×6): qty 1

## 2023-10-02 MED ORDER — LACTATED RINGERS IV BOLUS: 500.0000 mL | Freq: Once | INTRAVENOUS | Status: DC

## 2023-10-02 MED ORDER — THIAMINE HCL 100 MG/ML IJ SOLN
100.0000 mg | Freq: Every day | INTRAMUSCULAR | Status: DC
Start: 2023-10-02 — End: 2023-10-04
  Administered 2023-10-02 – 2023-10-03 (×2): 100 mg via INTRAVENOUS
  Filled 2023-10-02 (×3): qty 2

## 2023-10-02 MED ORDER — MAGNESIUM SULFATE 2 GM/50ML IV SOLN
2.0000 g | Freq: Once | INTRAVENOUS | Status: AC
  Administered 2023-10-02: 2 g via INTRAVENOUS
  Filled 2023-10-02: qty 50

## 2023-10-02 MED ORDER — LACTATED RINGERS IV BOLUS
500.0000 mL | Freq: Once | INTRAVENOUS | Status: AC
  Administered 2023-10-02: 500 mL via INTRAVENOUS

## 2023-10-02 MED ORDER — FOLIC ACID 1 MG PO TABS
1.0000 mg | ORAL_TABLET | Freq: Every day | ORAL | Status: DC
  Filled 2023-10-02: qty 1

## 2023-10-02 MED ORDER — DEXTROSE 50 % IV SOLN
INTRAVENOUS | Status: AC
  Administered 2023-10-02: 12.5 g via INTRAVENOUS
  Filled 2023-10-02: qty 50

## 2023-10-02 MED ORDER — SODIUM CHLORIDE 0.9 % IV BOLUS
1000.0000 mL | Freq: Once | INTRAVENOUS | Status: AC
  Administered 2023-10-02: 1000 mL via INTRAVENOUS

## 2023-10-02 MED ORDER — INSULIN ASPART 100 UNIT/ML IJ SOLN
0.0000 [IU] | INTRAMUSCULAR | Status: DC
  Administered 2023-10-03 – 2023-10-04 (×3): 1 [IU] via SUBCUTANEOUS
  Administered 2023-10-04 – 2023-10-05 (×6): 2 [IU] via SUBCUTANEOUS
  Administered 2023-10-05 (×2): 3 [IU] via SUBCUTANEOUS
  Administered 2023-10-05 – 2023-10-06 (×4): 2 [IU] via SUBCUTANEOUS
  Administered 2023-10-06: 1 [IU] via SUBCUTANEOUS
  Administered 2023-10-06: 2 [IU] via SUBCUTANEOUS
  Administered 2023-10-06: 1 [IU] via SUBCUTANEOUS
  Administered 2023-10-07: 2 [IU] via SUBCUTANEOUS
  Administered 2023-10-07: 1 [IU] via SUBCUTANEOUS
  Administered 2023-10-07 – 2023-10-08 (×5): 2 [IU] via SUBCUTANEOUS
  Administered 2023-10-08: 1 [IU] via SUBCUTANEOUS
  Administered 2023-10-08: 2 [IU] via SUBCUTANEOUS
  Administered 2023-10-08: 1 [IU] via SUBCUTANEOUS
  Administered 2023-10-08 (×2): 2 [IU] via SUBCUTANEOUS
  Administered 2023-10-09 – 2023-10-11 (×5): 1 [IU] via SUBCUTANEOUS

## 2023-10-02 MED ORDER — LORAZEPAM 2 MG/ML IJ SOLN: 1.0000 mg | INTRAMUSCULAR | Status: DC | PRN

## 2023-10-02 MED ORDER — ORAL CARE MOUTH RINSE
15.0000 mL | OROMUCOSAL | Status: DC
  Administered 2023-10-02 – 2023-10-16 (×49): 15 mL via OROMUCOSAL

## 2023-10-02 MED ORDER — ONDANSETRON HCL 4 MG/2ML IJ SOLN
4.0000 mg | Freq: Once | INTRAMUSCULAR | Status: AC
  Administered 2023-10-02: 4 mg via INTRAVENOUS
  Filled 2023-10-02: qty 2

## 2023-10-02 MED ORDER — PHENOBARBITAL SODIUM 65 MG/ML IJ SOLN
65.0000 mg | Freq: Three times a day (TID) | INTRAMUSCULAR | Status: DC
  Administered 2023-10-04 – 2023-10-05 (×2): 65 mg via INTRAVENOUS
  Filled 2023-10-02 (×3): qty 1

## 2023-10-02 MED ORDER — DEXTROSE 50 % IV SOLN
INTRAVENOUS | Status: AC
  Administered 2023-10-02: 25 mL
  Filled 2023-10-02: qty 50

## 2023-10-02 MED ORDER — DOCUSATE SODIUM 100 MG PO CAPS: 100.0000 mg | ORAL_CAPSULE | Freq: Two times a day (BID) | ORAL | Status: DC | PRN

## 2023-10-02 NOTE — ED Notes (Signed)
Ativan given

## 2023-10-02 NOTE — Progress Notes (Signed)
 LTM EEG hooked up and running - no initial skin breakdown - push button tested - [facility] monitoring.

## 2023-10-02 NOTE — Progress Notes (Signed)
 Pharmacy Antibiotic Note  Connor Copeland is a 48 y.o. male admitted on 10/02/2023 with  wound infection .  Pharmacy has been consulted for vancomycin dosing.  Plan: Vancomycin 2g IV x 1 as load followed by vancomycin 750mg  IV q 12h (eAUC 444) -vanc goal AUC 400-600, trough 15-56mcg/mL -levels PRN per protocol F/u LOT, renal func   Height: 5\' 8"  (172.7 cm) Weight: 105.1 kg (231 lb 11.3 oz) IBW/kg (Calculated) : 68.4  Temp (24hrs), Avg:98.2 F (36.8 C), Min:98 F (36.7 C), Max:98.4 F (36.9 C)  Recent Labs  Lab 10/02/23 1050 10/02/23 1416  WBC 9.8  --   CREATININE 1.26*  --   LATICACIDVEN 2.1* 1.3    Estimated Creatinine Clearance: 83.4 mL/min (A) (by C-G formula based on SCr of 1.26 mg/dL (H)).    Allergies  Allergen Reactions   Gabapentin Swelling    Swelling off the legs    Antimicrobials this admission: Vanc 2/27>  Dose adjustments this admission:   Microbiology results:   Thank you for allowing pharmacy to be a part of this patient's care.  Calton Dach, PharmD, BCCCP Clinical Pharmacist 10/02/2023 5:55 PM

## 2023-10-02 NOTE — Progress Notes (Addendum)
 MB called and spoke to Nurse Penni Bombard, Pt is Just now arriving by [facility] from [facility]. Pt has been to CT, She is just given Ativan and Pt will be transported shortly to [address]. MB will set up equipment and get things started as schedule permits at end of shift. Pt is NOT available at this time.

## 2023-10-02 NOTE — ED Notes (Signed)
 Verbal 2 mg Ativan give now due to seizure like activity. EDP at bedside. Pt clenching bilateral arms and legs, bottom lips tucked in during episode.

## 2023-10-02 NOTE — ED Triage Notes (Signed)
 Pt arrived [facility] from home with c/o AMS x 2 days per mom. Pt has falling several times and not been seen by anyone. Pt usually drinks daily but hasn't drank since Sunday. Pt usually ambulates with a walker.

## 2023-10-02 NOTE — ED Notes (Signed)
 Pt continues to pull up bottom lip into mouth and clench bilateral arms and bilateral legs. When attempting to arouse pt he takes his fist to move it away.

## 2023-10-02 NOTE — ED Notes (Signed)
 Pt moving all extremities every few minutes .

## 2023-10-02 NOTE — ED Notes (Signed)
 Staff notified of possible intubation.

## 2023-10-02 NOTE — ED Notes (Signed)
 Holding luminal at this time due to pt is settled down.

## 2023-10-02 NOTE — ED Notes (Signed)
 Pt has started snoring and stopped twitching, hallucinating and clenching at this time .

## 2023-10-02 NOTE — ED Provider Notes (Cosign Needed Addendum)
 [place] EMERGENCY DEPARTMENT AT [facility] Provider Note   CSN: [id number] Arrival date & time: 10/02/23  1006     History  Chief Complaint  Patient presents with   Altered Mental Status    Connor Copeland is a 48 y.o. male.  He has extensive past medical history including chronic inflammatory demyelinating polyneuropathy,  alcohol abuse with history of withdrawal seizure seizure in the past,, hyponatremia thought to be due to chronic steroid use. Patient lives by himself but family checks on him frequently.  Apparently last alcohol intake was 3 to 4 days ago, over the past 2 to 3 days he has become more aggressive with family, had multiple falls and refused to be brought to the hospital and today was confused so his mother called EMS.   Patient reportedly drinks about 1/5 of fireball whiskey daily for many years.   Altered Mental Status      Home Medications Prior to Admission medications   Medication Sig Start Date End Date Taking? Authorizing Provider  metFORMIN (GLUCOPHAGE) 500 MG tablet Take 500 mg by mouth 2 (two) times daily. 08/29/23  Yes [provider]  acetaminophen (TYLENOL) 325 MG tablet Take 2 tablets (650 mg total) by mouth every 6 (six) hours as needed for mild pain (pain score 1-3) (or Fever >/= 101). 08/21/23   Angiulli, Mcarthur Rossetti, PA-C  amLODipine (NORVASC) 2.5 MG tablet Take 1 tablet (2.5 mg total) by mouth daily. 08/25/23   Angiulli, Mcarthur Rossetti, PA-C  diltiazem (CARDIZEM CD) 120 MG 24 hr capsule Take 1 capsule (120 mg total) by mouth daily. 08/25/23   Angiulli, Mcarthur Rossetti, PA-C  DULoxetine (CYMBALTA) 60 MG capsule Take 2 capsules (120 mg total) by mouth daily. 08/25/23   Angiulli, Mcarthur Rossetti, PA-C  fluticasone (FLONASE) 50 MCG/ACT nasal spray Place 1 spray into both nostrils daily. 07/15/23   [provider]  folic acid (FOLVITE) 1 MG tablet Take 1 tablet (1 mg total) by mouth daily. 08/25/23   Angiulli, Mcarthur Rossetti, PA-C   HYDROcodone-acetaminophen (NORCO) 7.5-325 MG tablet Take 1 tablet by mouth 3 (three) times daily as needed. 08/25/23   Angiulli, Mcarthur Rossetti, PA-C  linagliptin (TRADJENTA) 5 MG TABS tablet Take 1 tablet (5 mg total) by mouth daily. 08/25/23   Angiulli, Mcarthur Rossetti, PA-C  magnesium oxide (MAG-OX) 400 (240 Mg) MG tablet Take 1 tablet (400 mg total) by mouth 2 (two) times daily. 08/25/23   Angiulli, Mcarthur Rossetti, PA-C  Multiple Vitamin (MULTIVITAMIN WITH MINERALS) TABS tablet Take 1 tablet by mouth daily. 08/21/23   Angiulli, Mcarthur Rossetti, PA-C  nicotine (NICODERM CQ - DOSED IN MG/24 HOURS) 21 mg/24hr patch 21 mg patch daily for 1 week then 14 mg patch daily for 3 weeks then 7 mg patch daily for 3 weeks and stop 08/25/23   Angiulli, Mcarthur Rossetti, PA-C  nortriptyline (PAMELOR) 25 MG capsule Take 2 capsules (50 mg total) by mouth at bedtime. 08/25/23   Angiulli, Mcarthur Rossetti, PA-C  Oxcarbazepine (TRILEPTAL) 300 MG tablet 300 milligrams daily and 600 mg nightly 08/25/23   Angiulli, Mcarthur Rossetti, PA-C  pantoprazole (PROTONIX) 40 MG tablet Take 1 tablet (40 mg total) by mouth daily. 08/25/23   Angiulli, Mcarthur Rossetti, PA-C  thiamine (VITAMIN B1) 100 MG tablet Take 1 tablet (100 mg total) by mouth daily. 08/25/23   Angiulli, Mcarthur Rossetti, PA-C  febuxostat (ULORIC) 40 MG tablet Take 1 tablet (40 mg total) by mouth daily. 08/25/23   Angiulli, Mcarthur Rossetti, PA-C  Allergies    Gabapentin    Review of Systems   Review of Systems  Physical Exam Updated Vital Signs BP 110/88   Pulse 94   Temp 98 F (36.7 C) (Axillary)   Resp 17   Ht 5\' 8"  (1.727 m)   Wt 104.3 kg   SpO2 100%   BMI 34.97 kg/m  Physical Exam Vitals and nursing note reviewed.  Constitutional:      General: He is not in acute distress.    Appearance: He is well-developed.  HENT:     Head: Normocephalic and atraumatic.  Eyes:     Extraocular Movements: Extraocular movements intact.     Conjunctiva/sclera: Conjunctivae normal.     Pupils: Pupils are equal, round, and  reactive to light.  Cardiovascular:     Rate and Rhythm: Normal rate and regular rhythm.     Heart sounds: No murmur heard. Pulmonary:     Effort: Pulmonary effort is normal. No respiratory distress.     Breath sounds: Normal breath sounds.  Abdominal:     Palpations: Abdomen is soft.     Tenderness: There is no abdominal tenderness.  Musculoskeletal:        General: No swelling.     Cervical back: Neck supple.  Skin:    General: Skin is warm and dry.     Capillary Refill: Capillary refill takes less than 2 seconds.     Comments: Subtle abrasions to all extremities, specifically there is a healing open wound to the volar aspect of the left forearm with no drainage or surrounding cellulitis.  Neurological:     Mental Status: He is disoriented and confused.     Cranial Nerves: No facial asymmetry.     Motor: Tremor present. No weakness.  Psychiatric:        Mood and Affect: Mood is anxious. Affect is angry.        Speech: Speech is rapid and pressured.        Behavior: Behavior is uncooperative, agitated, aggressive and hyperactive.     ED Results / Procedures / Treatments   Labs (all labs ordered are listed, but only abnormal results are displayed) Labs Reviewed  COMPREHENSIVE METABOLIC PANEL - Abnormal; Notable for the following components:      Result Value   Sodium 122 (*)    Potassium 3.0 (*)    Chloride 73 (*)    Creatinine, Ser 1.26 (*)    Calcium 7.3 (*)    Total Protein 5.9 (*)    Albumin 2.1 (*)    AST 230 (*)    ALT 168 (*)    Alkaline Phosphatase 477 (*)    Total Bilirubin 3.9 (*)    Anion gap 18 (*)    All other components within normal limits  CBC - Abnormal; Notable for the following components:   RBC 3.56 (*)    Hemoglobin 11.1 (*)    HCT 32.6 (*)    nRBC 0.5 (*)    All other components within normal limits  AMMONIA - Abnormal; Notable for the following components:   Ammonia 43 (*)    All other components within normal limits  MAGNESIUM -  Abnormal; Notable for the following components:   Magnesium 1.5 (*)    All other components within normal limits  LACTIC ACID, PLASMA - Abnormal; Notable for the following components:   Lactic Acid, Venous 2.1 (*)    All other components within normal limits  ETHANOL  LACTIC ACID, PLASMA  RAPID URINE DRUG SCREEN, HOSP PERFORMED  URINALYSIS, ROUTINE W REFLEX MICROSCOPIC  CBG MONITORING, ED    EKG EKG Interpretation Date/Time:  Thursday October 02 2023 10:17:02 EST Ventricular Rate:  103 PR Interval:  161 QRS Duration:  108 QT Interval:  392 QTC Calculation: 514 R Axis:   152  Text Interpretation: Sinus tachycardia Ventricular premature complex Aberrant conduction of SV complex(es) Right axis deviation Low voltage, extremity leads Abnormal R-wave progression, late transition Prolonged QT interval Confirmed by Beckey Downing [phone]) on 10/02/2023 10:18:38 AM  Radiology CT Cervical Spine Wo Contrast Result Date: 10/02/2023 CLINICAL DATA:  Altered mental status, falls EXAM: CT CERVICAL SPINE WITHOUT CONTRAST TECHNIQUE: Multidetector CT imaging of the cervical spine was performed without intravenous contrast. Multiplanar CT image reconstructions were also generated. RADIATION DOSE REDUCTION: This exam was performed according to the departmental dose-optimization program which includes automated exposure control, adjustment of the mA and/or kV according to patient size and/or use of iterative reconstruction technique. COMPARISON:  01/28/2023 FINDINGS: Alignment: Alignment is maintained. No listhesis. No facet subluxation or dislocation. Skull base and vertebrae: No acute fracture. No primary bone lesion or focal pathologic process. Soft tissues and spinal canal: No prevertebral fluid or swelling. No visible canal hematoma. Disc levels: Intervertebral disc spaces are maintained. Mild degenerative endplate osteophytes at C6-7. Small disc osteophyte complex at C6-7 without significant stenosis. No  high-grade osseous spinal canal stenosis. No high-grade foraminal stenosis. Degenerative changes at the atlantal dens articulation. Upper chest: Negative. Other: None. IMPRESSION: 1. No acute fracture or traumatic subluxation of the cervical spine. 2. Mild degenerative changes at C6-7. Electronically Signed   By: Emily Filbert M.D.   On: 10/02/2023 14:00   CT Head Wo Contrast Result Date: 10/02/2023 CLINICAL DATA:  Head trauma, abnormal mental status. History of falls. EXAM: CT HEAD WITHOUT CONTRAST TECHNIQUE: Contiguous axial images were obtained from the base of the skull through the vertex without intravenous contrast. RADIATION DOSE REDUCTION: This exam was performed according to the departmental dose-optimization program which includes automated exposure control, adjustment of the mA and/or kV according to patient size and/or use of iterative reconstruction technique. COMPARISON:  Head CT 08/10/2023 FINDINGS: Brain: New uniformly low-density subdural fluid collections anteriorly over both cerebral convexities measure up to 10 mm in thickness on the right and 9 mm on the left. There is associated mild mass effect on the frontal lobes. There is no midline shift. No hyperdense intracranial hemorrhage, acute infarct, or mass is evident. The ventricles are mildly smaller than on the prior CT related to mass effect from the subdural collections. Vascular: No hyperdense vessel. Skull: No acute fracture or suspicious lesion. Sinuses/Orbits: Mild mucosal thickening in the included portions of the maxillary sinuses. Small bilateral mastoid effusions. Unremarkable orbits. Other: None. IMPRESSION: New bilateral low-density subdural fluid collections over the cerebral convexities compatible with hygromas. Mild mass effect without midline shift. Electronically Signed   By: Sebastian Ache M.D.   On: 10/02/2023 12:02    Procedures .Critical Care  Performed by: Ma Rings, PA-C Authorized by: Ma Rings,  PA-C   Critical care provider statement:    Critical care time (minutes):  90   Critical care time was exclusive of:  Separately billable procedures and treating other patients   Critical care was necessary to treat or prevent imminent or life-threatening deterioration of the following conditions: alcohol withdrawal with delerium.   Critical care was time spent personally by me on the following activities:  Development of treatment plan  with patient or surrogate, discussions with consultants, evaluation of patient's response to treatment, examination of patient, ordering and review of laboratory studies, ordering and review of radiographic studies, ordering and performing treatments and interventions, pulse oximetry, re-evaluation of patient's condition, review of old charts and obtaining history from patient or surrogate   Care discussed with comment:  Dr. Denese Killings, Dr. Conchita Paris, Dr. Juel Burrow     Medications Ordered in ED Medications  thiamine (VITAMIN B1) injection 100 mg (100 mg Intravenous Given 10/02/23 1037)  PHENObarbital (LUMINAL) injection 260 mg (has no administration in time range)  potassium chloride 10 mEq in 100 mL IVPB (10 mEq Intravenous New Bag/Given 10/02/23 1501)  LORazepam (ATIVAN) tablet 1-4 mg ( Oral See Alternative 10/02/23 1419)    Or  LORazepam (ATIVAN) injection 1-4 mg (2 mg Intravenous Given 10/02/23 1419)  LORazepam (ATIVAN) injection 2 mg (2 mg Intravenous Given 10/02/23 1036)  folic acid injection 1 mg (1 mg Intravenous Given 10/02/23 1337)  LORazepam (ATIVAN) injection 2 mg (2 mg Intravenous Given 10/02/23 1101)  LORazepam (ATIVAN) injection 2 mg (2 mg Intravenous Given 10/02/23 1054)  ondansetron (ZOFRAN) injection 4 mg (4 mg Intravenous Given 10/02/23 1215)  lactated ringers bolus 500 mL (0 mLs Intravenous Stopped 10/02/23 1228)  magnesium sulfate IVPB 2 g 50 mL (0 g Intravenous Stopped 10/02/23 1303)  Tdap (BOOSTRIX) injection 0.5 mL (0.5 mLs Intramuscular Given 10/02/23  1343)  sodium chloride 0.9 % bolus 1,000 mL (1,000 mLs Intravenous New Bag/Given 10/02/23 1459)    ED Course/ Medical Decision Making/ A&P Clinical Course as of 10/02/23 1524  Thu Oct 02, 2023  1047 Patient presents for altered mental status, appears to be in alcohol withdrawal with history of quitting drinking alcohol about 3 to 4 days ago.  History of withdrawal seizures in the past.  He is thrashing about in the bed and is obviously confused, very sensitive to light.  Very difficult to get a history from the patient.  Given thiamine and folic acid, Ativan 2 mg.  On reassessment patient is still actively hallucinating and agitated and not able to tolerate head CT, so given 2 more milligrams of Ativan at this time, we are continuously monitoring him closely for worsening of symptoms, development of seizures. [CB]  1137 After total of 6 mg of Ativan, symptoms are controlled, tremors improved, patient sleeping maintaining his airway saturating 94% on room air.  Phenobarbital was ordered, though by the time it came up patient's symptoms had slightly improved so we will hold off on administering this at this time, will continue to observe.  Labs show hypokalemia, hypomagnesemia, hyponatremia and hypochloremia, ordered magnesium and potassium replacement, 500 cc of LR for AKI and hyponatremia, LFTs are all elevated as well secondary to alcohol use most likely. [CB]  1319 Patient still asleep, saturating well on room air, heart rate in the 90s [CB]  1431 Went to reassess patient and he had a proximately 1 minute long seizure, given 2 mg of Ativan [CB]  1432 Patient had second seizure approximately 3 minutes after second dose of Ativan.  At this time we will proceed with the phenobarbital.  Sister at bedside and reconfirms full CODE STATUS, understands that patient may need to be intubated if medication causes apnea.  He is currently resting comfortably saturating 100% on 2 L nasal cannula.  Due to sodium of 122  and seizures we will consult with neurology to see if they want any medications.  Feel seizure likely due to his alcohol withdrawal  but want to confirm with nephrology. [CB]  1434 I spoke with Dr. Juel Burrow from nephrology, he feels sodium is not the cause of seizures, can hydrate if patient is volume down. [CB]    Clinical Course User Index [CB] Ma Rings, PA-C                                 Medical Decision Making This patient presents to the ED for concern of AMS, this involves an extensive number of treatment options, and is a complaint that carries with it a high risk of complications and morbidity.  The differential diagnosis includes ETOH withdrawal, Alcohol intoxication, TBI, Sepsis, electrolyte abnormality, other   Co morbidities that complicate the patient evaluation :   Chronic inflammatory demyelinating polyneuropathy, alcohol abuse   Additional history obtained:  Additional history obtained from EMR External records from outside source obtained and reviewed including notes and labs, EEG results from September with normal results   Lab Tests:  I Ordered, and personally interpreted labs.  The pertinent results include: Showed no leukocytosis, alcohol negative, glucose normal, CMP shows sodium 122, potassium 3.0, chloride 73, patient has AKI with creatinine 1.26, elevated LFTs including bilirubin 3.9, anion gap of 18, magnesium 1.5, ammonia 43   Imaging Studies ordered:  I ordered imaging studies including CT head which shows bilateral subdural hygromas, CT C-spine shows no acute traumatic findings I independently visualized and interpreted imaging within scope of identifying emergent findings  I agree with the radiologist interpretation   Cardiac Monitoring: / EKG:  The patient was maintained on a cardiac monitor.  I personally viewed and interpreted the cardiac monitored which showed an underlying rhythm of: Sinus tachycardia   Consultations Obtained:  I  requested consultation with the neurosurgeon Dr. Conchita Paris regarding patient's hygromas on CT, he states they are small, no indication for treatment, patient can follow-up in outpatient office after he gets over his alcohol withdrawal  Had seizure-like activity in the ED, given his hyponatremia we did consult with Dr. Juel Burrow nephrology who feels this is likely related to his alcohol withdrawal, no indication for treatment outside of hydration for his hypovolemia  Hospitalist did not feel patient could be managed here at [facility], consult with ICU, given patient had seizure-like activity here will transfer to [facility], though there are no beds so patient will have to go ED to ED.  Drs. Trifan and Kommor Redge Gainer, ED are aware and agreeable with patient's transfer     Problem List / ED Course / Critical interventions / Medication management  Call with trauma-patient has not drank alcohol about 3 days, has been having frequent falls, has been extremely agitated, yesterday per his mother he was loosening that there were snakes around.  He was very agitated on arrival, after 3 doses of Ativan he had good control and was stable, as a above electrolytes were repleted, CT scan showed hygromas and consulted with neurosurgery who does not feel this is clinically significant at this time patient can follow-up with outpatient.  His large healing cut on the left arm was dressed by RN he was given a tetanus shot.  Given seizure-like activity, patient cannot stay at [facility], will transfer to ED at [facility] holding for bed in the ICU.  After his fourth dose of Ativan he has been comfortable, he has some baseline twitching though his sister states this is normal for him.  Most  recent EEG was normal, of note his second lactic acid has now come back and is normal, this was drawn immediately after the seizure-like activity, so this may have not been a true seizure.  His blood pressure had dropped somewhat after  most recent dose of Ativan so given fluid bolus.  Will hold off on barbital for now which was going to be neck step if he had continued seizing or signs of severe withdrawal.  Patient also evaluated by Dr. Rhae Hammock in the ED  I have reviewed the patients home medicines and have made adjustments as needed       Amount and/or Complexity of Data Reviewed Labs: ordered. Radiology: ordered.  Risk Prescription drug management.           Final Clinical Impression(s) / ED Diagnoses Final diagnoses:  Alcohol withdrawal seizure with delirium (HCC)  Hyponatremia  Post-traumatic subdural hygroma    Rx / DC Orders ED Discharge Orders     None         Ma Rings, PA-C 10/02/23 [address] 10/02/23 1528    Durwin Glaze, MD 10/03/23 [phone]

## 2023-10-02 NOTE — ED Notes (Signed)
Pt returned form CT.

## 2023-10-02 NOTE — ED Notes (Signed)
 1 bag of belongings going with pt.

## 2023-10-02 NOTE — ED Notes (Signed)
 Pt purposefully attempting to resist care and pt still did not open his eyes. Pt sister took pts shoes and cell phone. Sister threw away pt clothing. Pt stated pt had left over feces on him from home, that they were unable to clean. Staff cleaned pt and noted several abrasions on lower extremities and feet to go along with his upper arms.

## 2023-10-02 NOTE — ED Notes (Signed)
 Patient transported to CT

## 2023-10-02 NOTE — ED Notes (Signed)
 Seizure pads in place

## 2023-10-02 NOTE — ED Notes (Signed)
 EDP decides to hold Luminal at this time.

## 2023-10-02 NOTE — ED Notes (Signed)
 Nasal cannula at 2lpm due to oxygen went down while pt while pt was twitching in bed.

## 2023-10-02 NOTE — H&P (Signed)
 NAME:  Connor Copeland, MRN:  [id number], DOB:  08/03/1976, LOS: 0 ADMISSION DATE:  10/02/2023, CONSULTATION DATE:  10/02/23 REFERRING MD:  Rhae Hammock, EDP, CHIEF COMPLAINT:  etoh withdrawal seizures   History of Present Illness:  48 year old male with past medical history of chronic inflammatory demyelinating polyneuropathy, alcohol use disorder, seizures, hyponatremia who presented to [facility] ED on 2/27 with altered mental status. Reported last drink 3-4 days ago. Becoming more aggressive, multiple falls. In ED, tachycardic, hypertensive, anxious. Labs notable for Na 122, K 3, Cl 73, Cr 1.26, AST 230, ALT 168, Alk phos 477, T bili 3.9, AG 18, Hgb 11.1, plt 375, ammonia 43, ethanol negative, mag 1.5, lactic 2.1. CT head with bilateral hygromas with mild mass effect no MLS. He was initially given 4mg  ativan for CT. Given 2 more mg before symptoms controlled. They also ordered thiamine, folic acid, phenobarbital (not given). He had seizure while in ED and given 2 mg ativan. CCM consulted for admission  Pertinent  Medical History  CIDP, alcohol use, seizures, hyponatremia  Significant Hospital Events: Including procedures, antibiotic start and stop dates in addition to other pertinent events   2/27: ED for etoh w/d, seizure   Interim History / Subjective:  Patient does not participate in subjective portion of exam. Thrashes intermittently in bed, does not follow commands. On nasal cannula.   Objective   Blood pressure (!) 113/96, pulse 98, temperature 98.4 F (36.9 C), temperature source Oral, resp. rate 17, height 5\' 8"  (1.727 m), weight 104.3 kg, SpO2 99%.       No intake or output data in the 24 hours ending 10/02/23 1504 Filed Weights   10/02/23 1022  Weight: 104.3 kg    Examination: General: middle aged male, laying in bed, restless and moderately agitated  HENT: anicteric sclera, PERRLA, mm dry, nasal cannula  Lungs: clear bilaterally, resp unlabored, nasal cannula 2L  Cardiovascular:  s1s2 tachycardia, no murmur, rub, gallop Abdomen: rounded, non-distended Extremities: purulent open wound to LUE forearm with surrounding erythema, multiple other abrasions Neuro: does not open eyes to command, resists examiner opening eyes, PERRLA, moves all extremities spontaneously against gravity. Does not follow commands. No gaze. Keeps hands in fists  GU: no foley  Resolved Hospital Problem list    Assessment & Plan:  Alcohol withdrawal seizure Alcohol use disorder  Hyperammonemia  Elevated transaminases  History of alcohol withdrawal and withdrawal seizure. Reported seizure at OSH ED. Etoh negative on ED arrival. Reported last drink 3-4 days ago. On Trileptal at home  - precedex gtt  - phenobarbital taper ordered whenever able to take orals - prn ativan IV - lactulose 10g daily when able to take oral. Not high enough to consider lactulose enema at this time. Preemptively place cortrak for tomorrow, anticipate multiple day recovery with need for oral access until able to take PO - overnight eeg  - f/u uds - folic acid, mtvn, thiamine  - overnight eeg  - seizure precautions  - frequent neuro checks   Hyponatremia  Hypokalemia  Hypochloremia  Hypomagnesemia  Suspect part beer potomania. Appears to have history of hyponatremia in background of dehydration, likely malnourished.  - mag and K repleted in ED, trend and replete  - LR 1L bolus on arrival  - trend bmp, mag, phos   Lactic acidosis  Likely 2/2 seizure activity.  - trend   Acute kidney injury  Anion gap metabolic acidosis  AKI in setting of pre-renal azotemia from dehydration. Gap multifactorial in setting of lactic  acidosis, uremia, would suspect some starvation ketosis.  - 1L LR on arrival  - trend bmp, mag, phos - replete elytes - strict I&O - f/u urine studies - Avoid nephrotoxic agents, renally dose medications - ensure adequate renal perfusion   Left upper extremity wound, POA  Open LUE wound to the  forearm that is about 2x1x0.5cm to muscle. Appears to have been purulent at some time, wound base does not appear purulent  - vancomycin until MRSA swab negative then narrow to cefazolin   Bilateral hygromas with mild mass effect, no MLS  - frequent neuro checks  - stat imaging for any decompensation   Diabetes(?); not documented on patient PMH Takes metformin, linagliptin at home  - f/u A1c  - ssi  - cbg q4h   CIDP - takes cymbalta? Can reassess when clinically appropriate  Best Practice (right click and "Reselect all SmartList Selections" daily)   Diet/type: NPO DVT prophylaxis: prophylactic heparin  Pressure ulcer(s): see wound assessment  GI prophylaxis: N/A Lines: N/A Foley:  N/A Code Status:  full code Last date of multidisciplinary goals of care discussion [pending]  Labs   CBC: Recent Labs  Lab 10/02/23 1050  WBC 9.8  HGB 11.1*  HCT 32.6*  MCV 91.6  PLT 375    Basic Metabolic Panel: Recent Labs  Lab 10/02/23 1050  NA 122*  K 3.0*  CL 73*  CO2 31  GLUCOSE 89  BUN 16  CREATININE 1.26*  CALCIUM 7.3*  MG 1.5*   GFR: Estimated Creatinine Clearance: 83.1 mL/min (A) (by C-G formula based on SCr of 1.26 mg/dL (H)). Recent Labs  Lab 10/02/23 1050  WBC 9.8  LATICACIDVEN 2.1*    Liver Function Tests: Recent Labs  Lab 10/02/23 1050  AST 230*  ALT 168*  ALKPHOS 477*  BILITOT 3.9*  PROT 5.9*  ALBUMIN 2.1*   No results for input(s): "LIPASE", "AMYLASE" in the last 168 hours. Recent Labs  Lab 10/02/23 1050  AMMONIA 43*    ABG    Component Value Date/Time   HCO3 36.8 (H) 08/09/2023 2152   O2SAT 90.7 08/09/2023 2152     Coagulation Profile: No results for input(s): "INR", "PROTIME" in the last 168 hours.  Cardiac Enzymes: No results for input(s): "CKTOTAL", "CKMB", "CKMBINDEX", "TROPONINI" in the last 168 hours.  HbA1C: Hgb A1c MFr Bld  Date/Time Value Ref Range Status  08/08/2023 12:39 PM 8.9 (H) 4.8 - 5.6 % Final    Comment:     (NOTE)         Prediabetes: 5.7 - 6.4         Diabetes: >6.4         Glycemic control for adults with diabetes: <7.0     CBG: Recent Labs  Lab 10/02/23 1039  GLUCAP 98    Review of Systems:   As above  Past Medical History:  He,  has a past medical history of CIDP (chronic inflammatory demyelinating polyneuropathy) (HCC), ETOH abuse, and Gout.   Surgical History:   Past Surgical History:  Procedure Laterality Date   FOOT SURGERY       Social History:   reports that he has been smoking cigarettes. He has never used smokeless tobacco. He reports current alcohol use of about 42.0 standard drinks of alcohol per week. He reports that he does not use drugs.   Family History:  His family history includes COPD in his father.   Allergies Allergies  Allergen Reactions   Gabapentin Swelling  Swelling off the legs     Home Medications  Prior to Admission medications   Medication Sig Start Date End Date Taking? Authorizing Provider  metFORMIN (GLUCOPHAGE) 500 MG tablet Take 500 mg by mouth 2 (two) times daily. 08/29/23  Yes [provider]  acetaminophen (TYLENOL) 325 MG tablet Take 2 tablets (650 mg total) by mouth every 6 (six) hours as needed for mild pain (pain score 1-3) (or Fever >/= 101). 08/21/23   Angiulli, Mcarthur Rossetti, PA-C  amLODipine (NORVASC) 2.5 MG tablet Take 1 tablet (2.5 mg total) by mouth daily. 08/25/23   Angiulli, Mcarthur Rossetti, PA-C  diltiazem (CARDIZEM CD) 120 MG 24 hr capsule Take 1 capsule (120 mg total) by mouth daily. 08/25/23   Angiulli, Mcarthur Rossetti, PA-C  DULoxetine (CYMBALTA) 60 MG capsule Take 2 capsules (120 mg total) by mouth daily. 08/25/23   Angiulli, Mcarthur Rossetti, PA-C  fluticasone (FLONASE) 50 MCG/ACT nasal spray Place 1 spray into both nostrils daily. 07/15/23   [provider]  folic acid (FOLVITE) 1 MG tablet Take 1 tablet (1 mg total) by mouth daily. 08/25/23   Angiulli, Mcarthur Rossetti, PA-C  HYDROcodone-acetaminophen (NORCO) 7.5-325 MG tablet  Take 1 tablet by mouth 3 (three) times daily as needed. 08/25/23   Angiulli, Mcarthur Rossetti, PA-C  linagliptin (TRADJENTA) 5 MG TABS tablet Take 1 tablet (5 mg total) by mouth daily. 08/25/23   Angiulli, Mcarthur Rossetti, PA-C  magnesium oxide (MAG-OX) 400 (240 Mg) MG tablet Take 1 tablet (400 mg total) by mouth 2 (two) times daily. 08/25/23   Angiulli, Mcarthur Rossetti, PA-C  Multiple Vitamin (MULTIVITAMIN WITH MINERALS) TABS tablet Take 1 tablet by mouth daily. 08/21/23   Angiulli, Mcarthur Rossetti, PA-C  nicotine (NICODERM CQ - DOSED IN MG/24 HOURS) 21 mg/24hr patch 21 mg patch daily for 1 week then 14 mg patch daily for 3 weeks then 7 mg patch daily for 3 weeks and stop 08/25/23   Angiulli, Mcarthur Rossetti, PA-C  nortriptyline (PAMELOR) 25 MG capsule Take 2 capsules (50 mg total) by mouth at bedtime. 08/25/23   Angiulli, Mcarthur Rossetti, PA-C  Oxcarbazepine (TRILEPTAL) 300 MG tablet 300 milligrams daily and 600 mg nightly 08/25/23   Angiulli, Mcarthur Rossetti, PA-C  pantoprazole (PROTONIX) 40 MG tablet Take 1 tablet (40 mg total) by mouth daily. 08/25/23   Angiulli, Mcarthur Rossetti, PA-C  thiamine (VITAMIN B1) 100 MG tablet Take 1 tablet (100 mg total) by mouth daily. 08/25/23   Angiulli, Mcarthur Rossetti, PA-C  febuxostat (ULORIC) 40 MG tablet Take 1 tablet (40 mg total) by mouth daily. 08/25/23   Charlton Amor, PA-C     Critical care time: 40   Lenard Galloway [facility] 10/02/23 5:57 PM  Please see [email or web address] for pager details.  From 7A-7P if no response, please call [phone] After hours, please call ELink [phone]

## 2023-10-03 ENCOUNTER — Ambulatory Visit (HOSPITAL_COMMUNITY): Payer: 59

## 2023-10-03 ENCOUNTER — Encounter (HOSPITAL_COMMUNITY): Payer: 59 | Admitting: Occupational Therapy

## 2023-10-03 DIAGNOSIS — R451 Restlessness and agitation: Secondary | ICD-10-CM | POA: Diagnosis not present

## 2023-10-03 DIAGNOSIS — R443 Hallucinations, unspecified: Secondary | ICD-10-CM

## 2023-10-03 DIAGNOSIS — R569 Unspecified convulsions: Secondary | ICD-10-CM | POA: Diagnosis not present

## 2023-10-03 DIAGNOSIS — F10931 Alcohol use, unspecified with withdrawal delirium: Secondary | ICD-10-CM | POA: Diagnosis not present

## 2023-10-03 LAB — BASIC METABOLIC PANEL
Anion gap: 15 (ref 5–15)
BUN: 9 mg/dL (ref 6–20)
CO2: 25 mmol/L (ref 22–32)
Calcium: 6.3 mg/dL — CL (ref 8.9–10.3)
Chloride: 86 mmol/L — ABNORMAL LOW (ref 98–111)
Creatinine, Ser: 0.94 mg/dL (ref 0.61–1.24)
GFR, Estimated: >60 mL/min (ref >60)
Glucose, Bld: 80 mg/dL (ref 70–99)
Potassium: 3.4 mmol/L — ABNORMAL LOW (ref 3.5–5.1)
Sodium: 126 mmol/L — ABNORMAL LOW (ref 135–145)

## 2023-10-03 LAB — CBC
HCT: 29.5 % — ABNORMAL LOW (ref 39.0–52.0)
Hemoglobin: 10.1 g/dL — ABNORMAL LOW (ref 13.0–17.0)
MCH: 31.8 pg (ref 26.0–34.0)
MCHC: 34.2 g/dL (ref 30.0–36.0)
MCV: 92.8 fL (ref 80.0–100.0)
Platelets: 329 10*3/uL (ref 150–400)
RBC: 3.18 MIL/uL — ABNORMAL LOW (ref 4.22–5.81)
RDW: 14.9 % (ref 11.5–15.5)
WBC: 6.8 10*3/uL (ref 4.0–10.5)
nRBC: 0.6 % — ABNORMAL HIGH (ref 0.0–0.2)

## 2023-10-03 LAB — GLUCOSE, CAPILLARY
Glucose-Capillary: 123 mg/dL — ABNORMAL HIGH (ref 70–99)
Glucose-Capillary: 146 mg/dL — ABNORMAL HIGH (ref 70–99)
Glucose-Capillary: 62 mg/dL — ABNORMAL LOW (ref 70–99)
Glucose-Capillary: 72 mg/dL (ref 70–99)
Glucose-Capillary: 74 mg/dL (ref 70–99)
Glucose-Capillary: 83 mg/dL (ref 70–99)
Glucose-Capillary: 88 mg/dL (ref 70–99)
Glucose-Capillary: 92 mg/dL (ref 70–99)

## 2023-10-03 LAB — URINALYSIS, ROUTINE W REFLEX MICROSCOPIC
Bacteria, UA: NONE SEEN
Bilirubin Urine: NEGATIVE
Glucose, UA: NEGATIVE mg/dL
Ketones, ur: 5 mg/dL — AB
Leukocytes,Ua: NEGATIVE
Nitrite: NEGATIVE
Protein, ur: NEGATIVE mg/dL
Specific Gravity, Urine: 1.014 (ref 1.005–1.030)
pH: 6 (ref 5.0–8.0)

## 2023-10-03 LAB — RAPID URINE DRUG SCREEN, HOSP PERFORMED
Amphetamines: NOT DETECTED
Barbiturates: POSITIVE — AB
Benzodiazepines: POSITIVE — AB
Cocaine: NOT DETECTED
Opiates: NOT DETECTED
Tetrahydrocannabinol: POSITIVE — AB

## 2023-10-03 LAB — PHOSPHORUS: Phosphorus: 1.9 mg/dL — ABNORMAL LOW (ref 2.5–4.6)

## 2023-10-03 LAB — MAGNESIUM: Magnesium: 1.6 mg/dL — ABNORMAL LOW (ref 1.7–2.4)

## 2023-10-03 MED ORDER — MAGNESIUM SULFATE IN D5W 1-5 GM/100ML-% IV SOLN
1.0000 g | Freq: Once | INTRAVENOUS | Status: AC
  Administered 2023-10-03: 1 g via INTRAVENOUS
  Filled 2023-10-03: qty 100

## 2023-10-03 MED ORDER — MAGNESIUM SULFATE 2 GM/50ML IV SOLN
2.0000 g | Freq: Once | INTRAVENOUS | Status: AC
  Administered 2023-10-03: 2 g via INTRAVENOUS
  Filled 2023-10-03: qty 50

## 2023-10-03 MED ORDER — PROSOURCE TF20 ENFIT COMPATIBL EN LIQD
60.0000 mL | Freq: Two times a day (BID) | ENTERAL | Status: DC
  Administered 2023-10-03 – 2023-10-05 (×4): 60 mL
  Filled 2023-10-03 (×4): qty 60

## 2023-10-03 MED ORDER — OSMOLITE 1.5 CAL PO LIQD
1000.0000 mL | ORAL | Status: DC
  Administered 2023-10-03 – 2023-10-09 (×5): 1000 mL

## 2023-10-03 MED ORDER — LIDOCAINE HCL URETHRAL/MUCOSAL 2 % EX GEL
1.0000 | Freq: Once | CUTANEOUS | Status: DC
  Filled 2023-10-03: qty 11

## 2023-10-03 MED ORDER — JUVEN PO PACK
1.0000 | PACK | Freq: Two times a day (BID) | ORAL | Status: DC
  Filled 2023-10-03: qty 1

## 2023-10-03 MED ORDER — SODIUM CHLORIDE 0.9 % IV SOLN
30.0000 mmol | Freq: Once | INTRAVENOUS | Status: AC
  Administered 2023-10-03: 30 mmol via INTRAVENOUS
  Filled 2023-10-03: qty 10

## 2023-10-03 MED ORDER — MEDIHONEY WOUND/BURN DRESSING EX PSTE
1.0000 | PASTE | Freq: Every day | CUTANEOUS | Status: DC
  Administered 2023-10-03 – 2023-10-17 (×15): 1 via TOPICAL
  Filled 2023-10-03 (×2): qty 44

## 2023-10-03 MED ORDER — POTASSIUM CHLORIDE 10 MEQ/100ML IV SOLN
10.0000 meq | INTRAVENOUS | Status: AC
  Administered 2023-10-03 (×6): 10 meq via INTRAVENOUS
  Filled 2023-10-03 (×6): qty 100

## 2023-10-03 MED ORDER — CALCIUM GLUCONATE-NACL 2-0.675 GM/100ML-% IV SOLN
2.0000 g | Freq: Once | INTRAVENOUS | Status: AC
  Administered 2023-10-03: 2000 mg via INTRAVENOUS
  Filled 2023-10-03: qty 100

## 2023-10-03 MED ORDER — CALCIUM GLUCONATE-NACL 1-0.675 GM/50ML-% IV SOLN
1.0000 g | Freq: Once | INTRAVENOUS | Status: AC
  Administered 2023-10-03: 1000 mg via INTRAVENOUS
  Filled 2023-10-03: qty 50

## 2023-10-03 NOTE — Progress Notes (Signed)
 eLink Physician-Brief Progress Note Patient Name: Connor Copeland DOB: 13-Jun-1976 MRN: [id number]   Date of Service  10/03/2023  HPI/Events of Note    eICU Interventions      Electrolytes imbalance  Will correct    Massie Maroon 10/03/2023, 12:06 AM

## 2023-10-03 NOTE — Progress Notes (Signed)
 Pt unable to void on his own.  Bladder scan performed revealed .  In/out produced .  External catheter replaced.  Will continue to monitor.

## 2023-10-03 NOTE — Consult Note (Signed)
 Reason for Consult:BUE wounds Referring Physician: Chilton Greathouse Time called: 1407 Time at bedside: 1427   Connor Copeland is an 48 y.o. male.  HPI: Connor Copeland was admitted yesterday with AMS, possibly from EtOH withdrawal. He was noted to have several UE ulcerations including a full-thickness one on the left FA and hand surgery was consulted the following day. Pt is obtunded and cannot respond to questions. He is also extremely agitated.  Past Medical History:  Diagnosis Date   CIDP (chronic inflammatory demyelinating polyneuropathy) (HCC)    ETOH abuse    Gout     Past Surgical History:  Procedure Laterality Date   FOOT SURGERY      Family History  Problem Relation Age of Onset   COPD Father     Social History:  reports that he has been smoking cigarettes. He has never used smokeless tobacco. He reports current alcohol use of about 42.0 standard drinks of alcohol per week. He reports that he does not use drugs.  Allergies:  Allergies  Allergen Reactions   Neurontin [Gabapentin] Swelling    Leg swelling    Medications: I have reviewed the patient's current medications.  Results for orders placed or performed during the hospital encounter of 10/02/23 (from the past 48 hours)  CBG monitoring, ED     Status: None   Collection Time: 10/02/23 10:39 AM  Result Value Ref Range   Glucose-Capillary 98 70 - 99 mg/dL    Comment: Glucose reference range applies only to samples taken after fasting for at least 8 hours.  Comprehensive metabolic panel     Status: Abnormal   Collection Time: 10/02/23 10:50 AM  Result Value Ref Range   Sodium 122 (L) 135 - 145 mmol/L   Potassium 3.0 (L) 3.5 - 5.1 mmol/L   Chloride 73 (L) 98 - 111 mmol/L   CO2 31 22 - 32 mmol/L   Glucose, Bld 89 70 - 99 mg/dL    Comment: Glucose reference range applies only to samples taken after fasting for at least 8 hours.   BUN 16 6 - 20 mg/dL   Creatinine, Ser 1.61 (H) 0.61 - 1.24 mg/dL   Calcium  7.3 (L) 8.9 - 10.3 mg/dL   Total Protein 5.9 (L) 6.5 - 8.1 g/dL   Albumin 2.1 (L) 3.5 - 5.0 g/dL   AST 096 (H) 15 - 41 U/L   ALT 168 (H) 0 - 44 U/L   Alkaline Phosphatase 477 (H) 38 - 126 U/L   Total Bilirubin 3.9 (H) 0.0 - 1.2 mg/dL   GFR, Estimated >04 >54 mL/min    Comment: (NOTE) Calculated using the CKD-EPI Creatinine Equation (2021)    Anion gap 18 (H) 5 - 15    Comment: Performed at [facility], [address], [place], [place] [address]  CBC     Status: Abnormal   Collection Time: 10/02/23 10:50 AM  Result Value Ref Range   WBC 9.8 4.0 - 10.5 K/uL   RBC 3.56 (L) 4.22 - 5.81 MIL/uL   Hemoglobin 11.1 (L) 13.0 - 17.0 g/dL   HCT 91.4 (L) 78.2 - 95.6 %   MCV 91.6 80.0 - 100.0 fL   MCH 31.2 26.0 - 34.0 pg   MCHC 34.0 30.0 - 36.0 g/dL   RDW 21.3 08.6 - 57.8 %   Platelets 375 150 - 400 K/uL   nRBC 0.5 (H) 0.0 - 0.2 %    Comment: Performed at [facility], [address] [place], [place] [address]  Ammonia     Status: Abnormal   Collection Time: 10/02/23 10:50 AM  Result Value Ref Range   Ammonia 43 (H) 9 - 35 umol/L    Comment: Performed at [facility], [address], [place], [place] [address]  Ethanol     Status: None   Collection Time: 10/02/23 10:50 AM  Result Value Ref Range   Alcohol, Ethyl (B) <10 <10 mg/dL    Comment: (NOTE) Lowest detectable limit for serum alcohol is 10 mg/dL.  For medical purposes only. Performed at [facility], [address], [place], [place] [address]   Magnesium     Status: Abnormal   Collection Time: 10/02/23 10:50 AM  Result Value Ref Range   Magnesium 1.5 (L) 1.7 - 2.4 mg/dL    Comment: Performed at [facility], [address] [place], [place] [address]  Lactic acid, plasma     Status: Abnormal   Collection Time: 10/02/23 10:50 AM  Result Value Ref Range   Lactic Acid, Venous 2.1 (HH) 0.5 - 1.9 mmol/L    Comment: CRITICAL RESULT CALLED TO, READ BACK BY AND VERIFIED WITH BELCHER,J AT  12:05PM ON 10/02/23 BY [facility] Performed  at [facility], [address], [place], [place] [address]   Lactic acid, plasma     Status: None   Collection Time: 10/02/23  2:16 PM  Result Value Ref Range   Lactic Acid, Venous 1.3 0.5 - 1.9 mmol/L    Comment: Performed at [facility], [address] [place], [place] [address]  Glucose, capillary     Status: Abnormal   Collection Time: 10/02/23  5:55 PM  Result Value Ref Range   Glucose-Capillary 65 (L) 70 - 99 mg/dL    Comment: Glucose reference range applies only to samples taken after fasting for at least 8 hours.  MRSA Next Gen by PCR, Nasal     Status: None   Collection Time: 10/02/23  6:09 PM   Specimen: Nasal Mucosa; Nasal Swab  Result Value Ref Range   MRSA by PCR Next Gen NOT DETECTED NOT DETECTED    Comment: (NOTE) The GeneXpert MRSA Assay (FDA approved for NASAL specimens only), is one component of a comprehensive MRSA colonization surveillance program. It is not intended to diagnose MRSA infection nor to guide or monitor treatment for MRSA infections. Test performance is not FDA approved in patients less than 76 years old. Performed at [facility], [address] [place], [place] [address]   Glucose, capillary     Status: Abnormal   Collection Time: 10/02/23  6:10 PM  Result Value Ref Range   Glucose-Capillary 156 (H) 70 - 99 mg/dL    Comment: Glucose reference range applies only to samples taken after fasting for at least 8 hours.  CBC     Status: Abnormal   Collection Time: 10/02/23  6:31 PM  Result Value Ref Range   WBC 7.2 4.0 - 10.5 K/uL   RBC 3.06 (L) 4.22 - 5.81 MIL/uL   Hemoglobin 9.5 (L) 13.0 - 17.0 g/dL   HCT 52.8 (L) 41.3 - 24.4 %   MCV 91.5 80.0 - 100.0 fL   MCH 31.0 26.0 - 34.0 pg   MCHC 33.9 30.0 - 36.0 g/dL   RDW 01.0 27.2 - 53.6 %   Platelets 323 150 - 400 K/uL   nRBC 0.7 (H) 0.0 - 0.2 %    Comment: Performed at [facility], [address], [place], [place] [address]  Creatinine, serum  Status: None   Collection Time:  10/02/23  6:31 PM  Result Value Ref Range   Creatinine, Ser 1.13 0.61 - 1.24 mg/dL   GFR, Estimated >32 >44 mL/min    Comment: (NOTE) Calculated using the CKD-EPI Creatinine Equation (2021) Performed at [facility], [address], [place], [place] [address]   Protime-INR     Status: None   Collection Time: 10/02/23  7:02 PM  Result Value Ref Range   Prothrombin Time 15.1 11.4 - 15.2 seconds   INR 1.2 0.8 - 1.2    Comment: (NOTE) INR goal varies based on device and disease states. Performed at [facility], [address], [place], [place] [address]   Glucose, capillary     Status: None   Collection Time: 10/02/23  7:41 PM  Result Value Ref Range   Glucose-Capillary 97 70 - 99 mg/dL    Comment: Glucose reference range applies only to samples taken after fasting for at least 8 hours.  Basic metabolic panel     Status: Abnormal   Collection Time: 10/02/23 10:34 PM  Result Value Ref Range   Sodium 127 (L) 135 - 145 mmol/L   Potassium 2.3 (LL) 3.5 - 5.1 mmol/L    Comment: CRITICAL RESULT CALLED TO, READ BACK BY AND VERIFIED WITH D.EVERETTE, RN @2336  02.27.25 AALTOM   Chloride 83 (L) 98 - 111 mmol/L   CO2 28 22 - 32 mmol/L   Glucose, Bld 62 (L) 70 - 99 mg/dL    Comment: Glucose reference range applies only to samples taken after fasting for at least 8 hours.   BUN 9 6 - 20 mg/dL   Creatinine, Ser 4.40 0.61 - 1.24 mg/dL   Calcium 6.1 (LL) 8.9 - 10.3 mg/dL    Comment: CRITICAL RESULT CALLED TO, READ BACK BY AND VERIFIED WITH D.EVERETTE, RN @2336  02.27.25 AALTOM   GFR, Estimated >60 >60 mL/min    Comment: (NOTE) Calculated using the CKD-EPI Creatinine Equation (2021)    Anion gap 16 (H) 5 - 15    Comment: Performed at [facility], [address], [place], [place] [address]  Glucose, capillary     Status: Abnormal   Collection Time: 10/02/23 11:27 PM  Result Value Ref Range   Glucose-Capillary 62 (L) 70 - 99 mg/dL    Comment: Glucose reference range applies  only to samples taken after fasting for at least 8 hours.   Comment 1 Repeat Test   Glucose, capillary     Status: Abnormal   Collection Time: 10/02/23 11:28 PM  Result Value Ref Range   Glucose-Capillary 61 (L) 70 - 99 mg/dL    Comment: Glucose reference range applies only to samples taken after fasting for at least 8 hours.  Glucose, capillary     Status: None   Collection Time: 10/03/23 12:11 AM  Result Value Ref Range   Glucose-Capillary 92 70 - 99 mg/dL    Comment: Glucose reference range applies only to samples taken after fasting for at least 8 hours.  Glucose, capillary     Status: None   Collection Time: 10/03/23  3:21 AM  Result Value Ref Range   Glucose-Capillary 72 70 - 99 mg/dL    Comment: Glucose reference range applies only to samples taken after fasting for at least 8 hours.  Rapid urine drug screen (hospital performed)     Status: Abnormal   Collection Time: 10/03/23  4:45 AM  Result Value Ref Range   Opiates  NONE DETECTED NONE DETECTED   Cocaine NONE DETECTED NONE DETECTED   Benzodiazepines POSITIVE (A) NONE DETECTED   Amphetamines NONE DETECTED NONE DETECTED   Tetrahydrocannabinol POSITIVE (A) NONE DETECTED   Barbiturates POSITIVE (A) NONE DETECTED    Comment: (NOTE) DRUG SCREEN FOR MEDICAL PURPOSES ONLY.  IF CONFIRMATION IS NEEDED FOR ANY PURPOSE, NOTIFY LAB WITHIN 5 DAYS.  LOWEST DETECTABLE LIMITS FOR URINE DRUG SCREEN Drug Class                     Cutoff (ng/mL) Amphetamine and metabolites    1000 Barbiturate and metabolites    200 Benzodiazepine                 200 Opiates and metabolites        300 Cocaine and metabolites        300 THC                            50 Performed at [facility], [address] [place], [place] [address]   Urinalysis, Routine w reflex microscopic -Urine, Clean Catch     Status: Abnormal   Collection Time: 10/03/23  4:45 AM  Result Value Ref Range   Color, Urine AMBER (A) YELLOW    Comment: BIOCHEMICALS MAY  BE AFFECTED BY COLOR   APPearance CLEAR CLEAR   Specific Gravity, Urine 1.014 1.005 - 1.030   pH 6.0 5.0 - 8.0   Glucose, UA NEGATIVE NEGATIVE mg/dL   Hgb urine dipstick SMALL (A) NEGATIVE   Bilirubin Urine NEGATIVE NEGATIVE   Ketones, ur 5 (A) NEGATIVE mg/dL   Protein, ur NEGATIVE NEGATIVE mg/dL   Nitrite NEGATIVE NEGATIVE   Leukocytes,Ua NEGATIVE NEGATIVE   RBC / HPF 0-5 0 - 5 RBC/hpf   WBC, UA 0-5 0 - 5 WBC/hpf   Bacteria, UA NONE SEEN NONE SEEN   Squamous Epithelial / HPF 0-5 0 - 5 /HPF   Mucus PRESENT    Hyaline Casts, UA PRESENT     Comment: Performed at [facility], [address] [place], [place] [address]  Glucose, capillary     Status: None   Collection Time: 10/03/23  7:52 AM  Result Value Ref Range   Glucose-Capillary 88 70 - 99 mg/dL    Comment: Glucose reference range applies only to samples taken after fasting for at least 8 hours.  Basic metabolic panel     Status: Abnormal   Collection Time: 10/03/23  8:54 AM  Result Value Ref Range   Sodium 126 (L) 135 - 145 mmol/L   Potassium 3.4 (L) 3.5 - 5.1 mmol/L   Chloride 86 (L) 98 - 111 mmol/L   CO2 25 22 - 32 mmol/L   Glucose, Bld 80 70 - 99 mg/dL    Comment: Glucose reference range applies only to samples taken after fasting for at least 8 hours.   BUN 9 6 - 20 mg/dL   Creatinine, Ser 5.62 0.61 - 1.24 mg/dL   Calcium 6.3 (LL) 8.9 - 10.3 mg/dL    Comment: CRITICAL RESULT CALLED TO, READ BACK BY AND VERIFIED WITH C. Elisabeth Most RN ,  @1044 , 10/03/23, [place],T.   GFR, Estimated >60 >60 mL/min    Comment: (NOTE) Calculated using the CKD-EPI Creatinine Equation (2021)    Anion gap 15 5 - 15    Comment: Performed at [facility], [address] [place], [place]  [address]  CBC     Status: Abnormal   Collection Time: 10/03/23  8:54 AM  Result Value Ref Range   WBC 6.8 4.0 - 10.5 K/uL   RBC 3.18 (L) 4.22 - 5.81 MIL/uL   Hemoglobin 10.1 (L) 13.0 - 17.0 g/dL   HCT 30.8 (L) 65.7 - 84.6 %   MCV 92.8 80.0 -  100.0 fL   MCH 31.8 26.0 - 34.0 pg   MCHC 34.2 30.0 - 36.0 g/dL   RDW 96.2 95.2 - 84.1 %   Platelets 329 150 - 400 K/uL   nRBC 0.6 (H) 0.0 - 0.2 %    Comment: Performed at [facility], [address], [place], [place] [address]  Magnesium     Status: Abnormal   Collection Time: 10/03/23  8:54 AM  Result Value Ref Range   Magnesium 1.6 (L) 1.7 - 2.4 mg/dL    Comment: Performed at [facility], [address], [place], [place] [address]  Phosphorus     Status: Abnormal   Collection Time: 10/03/23  8:54 AM  Result Value Ref Range   Phosphorus 1.9 (L) 2.5 - 4.6 mg/dL    Comment: Performed at [facility], [address] [place], [place] [address]  Glucose, capillary     Status: None   Collection Time: 10/03/23 11:05 AM  Result Value Ref Range   Glucose-Capillary 74 70 - 99 mg/dL    Comment: Glucose reference range applies only to samples taken after fasting for at least 8 hours.    Overnight EEG with video Result Date: 10/03/2023 Charlsie Quest, MD     10/03/2023  8:56 AM Patient Name: Connor Copeland MRN: [id number] Epilepsy Attending: Charlsie Quest Referring Physician/Provider: Lynnell Catalan, MD Duration: 10/02/2023 1816 to 10/03/2023 0845 Patient history: 48 year old man who presented to [facility] ED with agitation and hallucinations. Multiple witnessed seizures which stopped with ativan but behaviour still not normal. EEG to evaluate for seizure Level of alertness: Awake, asleep AEDs during EEG study: Phenobarb Technical aspects: This EEG study was done with scalp electrodes positioned according to the 10-20 International system of electrode placement. Electrical activity was reviewed with band pass filter of 1-70Hz , sensitivity of 7 uV/mm, display speed of 6mm/sec with a 60Hz  notched filter applied as appropriate. EEG data were recorded continuously and digitally stored.  Video monitoring was available and reviewed as appropriate. Description: No clear posterior  dominant rhythm was seen.  EEG showed continuous generalized 3 to 6 Hz theta- delta slowing.  Sleep was characterized by sleep symptoms (12 to 14 Hz), maximal fronto-central region.  Hyperventilation and photic stimulation were not performed.   Event button was pressed on 10/03/2023 at 430 for stiffening of bilateral upper extremities.  Concomitant EEG before, during and after the event showed significant myogenic artifact and did not show any EEG to suggest seizure. ABNORMALITY - Continuous slow, generalized IMPRESSION: This study is suggestive of moderate diffuse encephalopathy. No seizures or epileptiform discharges were seen throughout the recording. Event button was pressed on 10/03/2023 at 0430 for stiffening of bilateral upper extremities without concomitant EEG change.  This was most likely not an epileptic event. Priyanka Annabelle Harman   US Abdomen Limited RUQ (LIVER/GB) Result Date: 10/02/2023 CLINICAL DATA:  Cirrhosis EXAM: ULTRASOUND ABDOMEN LIMITED RIGHT UPPER QUADRANT COMPARISON:  Ultrasound 12/23/2022 FINDINGS: Gallbladder: No gallstones or wall thickening visualized. No sonographic Murphy sign noted by sonographer. Common bile duct: Diameter: 4.8 mm Liver: Diffusely echogenic  liver parenchyma. No focal hepatic abnormality. No overt contour nodularity. Portal vein is patent on color Doppler imaging with normal direction of blood flow towards the liver. Other: None. IMPRESSION: 1. Echogenic liver parenchyma consistent with hepatic steatosis and hepatocellular disease. 2. Otherwise negative examination Electronically Signed   By: Jasmine Pang M.D.   On: 10/02/2023 22:34   CT Cervical Spine Wo Contrast Result Date: 10/02/2023 CLINICAL DATA:  Altered mental status, falls EXAM: CT CERVICAL SPINE WITHOUT CONTRAST TECHNIQUE: Multidetector CT imaging of the cervical spine was performed without intravenous contrast. Multiplanar CT image reconstructions were also generated. RADIATION DOSE REDUCTION: This exam  was performed according to the departmental dose-optimization program which includes automated exposure control, adjustment of the mA and/or kV according to patient size and/or use of iterative reconstruction technique. COMPARISON:  01/28/2023 FINDINGS: Alignment: Alignment is maintained. No listhesis. No facet subluxation or dislocation. Skull base and vertebrae: No acute fracture. No primary bone lesion or focal pathologic process. Soft tissues and spinal canal: No prevertebral fluid or swelling. No visible canal hematoma. Disc levels: Intervertebral disc spaces are maintained. Mild degenerative endplate osteophytes at C6-7. Small disc osteophyte complex at C6-7 without significant stenosis. No high-grade osseous spinal canal stenosis. No high-grade foraminal stenosis. Degenerative changes at the atlantal dens articulation. Upper chest: Negative. Other: None. IMPRESSION: 1. No acute fracture or traumatic subluxation of the cervical spine. 2. Mild degenerative changes at C6-7. Electronically Signed   By: Emily Filbert M.D.   On: 10/02/2023 14:00   CT Head Wo Contrast Result Date: 10/02/2023 CLINICAL DATA:  Head trauma, abnormal mental status. History of falls. EXAM: CT HEAD WITHOUT CONTRAST TECHNIQUE: Contiguous axial images were obtained from the base of the skull through the vertex without intravenous contrast. RADIATION DOSE REDUCTION: This exam was performed according to the departmental dose-optimization program which includes automated exposure control, adjustment of the mA and/or kV according to patient size and/or use of iterative reconstruction technique. COMPARISON:  Head CT 08/10/2023 FINDINGS: Brain: New uniformly low-density subdural fluid collections anteriorly over both cerebral convexities measure up to 10 mm in thickness on the right and 9 mm on the left. There is associated mild mass effect on the frontal lobes. There is no midline shift. No hyperdense intracranial hemorrhage, acute infarct, or  mass is evident. The ventricles are mildly smaller than on the prior CT related to mass effect from the subdural collections. Vascular: No hyperdense vessel. Skull: No acute fracture or suspicious lesion. Sinuses/Orbits: Mild mucosal thickening in the included portions of the maxillary sinuses. Small bilateral mastoid effusions. Unremarkable orbits. Other: None. IMPRESSION: New bilateral low-density subdural fluid collections over the cerebral convexities compatible with hygromas. Mild mass effect without midline shift. Electronically Signed   By: Sebastian Ache M.D.   On: 10/02/2023 12:02    Review of Systems  Unable to perform ROS: Mental status change   Blood pressure 100/70, pulse 90, temperature 98.5 F (36.9 C), temperature source Axillary, resp. rate 17, height 5\' 8"  (1.727 m), weight 105 kg, SpO2 100%. Physical Exam Constitutional:      General: He is not in acute distress.    Appearance: He is well-developed. He is not diaphoretic.     Comments: Somnolent  HENT:     Head: Normocephalic and atraumatic.  Eyes:     General: No scleral icterus.       Right eye: No discharge.        Left eye: No discharge.  Cardiovascular:     Rate and  Rhythm: Normal rate and regular rhythm.  Pulmonary:     Effort: Pulmonary effort is normal. No respiratory distress.  Musculoskeletal:     Comments: Bilateral shoulder, elbow, wrist, digits- Multiple scattered abrasions both FA's, most superficial but left distal 1/3 FA radial aspect has full-thickness ulceration about 3cm in diameter, fould odor, no instability, no blocks to motion  Sens  Ax/R/M/U could not assess  Mot   Ax/ R/ PIN/ M/ AIN/ U could not assess  Rad 2+  Skin:    General: Skin is warm and dry.  Psychiatric:        Behavior: Behavior is agitated.     Assessment/Plan: BUE wounds -- Continue wound care recommendations. Pt too agitated for any sort of bedside sharp debridement and don't feel wound warrants further sedation to accomplish  at this point. I don't appreciate any fluctuance or significant induration that lead me think he has deeper pathology. Once he is less agitated can revisit. When possible MRI w/wo or CT w/ may be helpful (MRI preferred).    Freeman Caldron, PA-C Orthopedic Surgery [phone] 10/03/2023, 2:40 PM

## 2023-10-03 NOTE — Progress Notes (Signed)
 1245:  Bladder scan greater than 500.  I/O attempted but meeting resistant and unable to complete.  CCM notified.  Coude Catheter placed.

## 2023-10-03 NOTE — Discharge Instructions (Signed)
In a time of Crisis: [facility]  Mobile Crisis Management provides immediate crisis response, 24/7.  Call [phone]  [facility] for MH/DD/SA [facility] is available 24 hours a day, 7 days a week. Customer Service Specialists will assist you to find a crisis provider that is well-matched with your needs. Your local number is: [phone]  [facility] ([facility]) IOP, individual counseling, medication management [address] [place], [place] [address] [phone] Call for intake hours; Medicaid and Uninsured    Outpatient Providers  [facility] ([facility]) Group and individual counseling. [address]  [place], [place] [address] [phone] [place]: [phone]  [place]: [phone] Medicaid and uninsured.   [facility] Offers IOP groups multiple times per week. [address] Sherian Mar[place], [place] [address] [phone] Takes Medicaid and other insurances.   [facility]  Chemical Dependency Intensive Outpatient Program (IOP) [address] [place], [place] [address] [phone] Takes Nurse, learning disability and [place].   [facility]  IOP and Partial Hospitalization Program  [address]  [place], [place] [address] [phone] Private Insurance, [name] only for partial hospitalization  [facility] [address], [place], [place] [address] [phone] Monday-Friday. Short and Long term options. [facility] ([facility]) IOP, individual counseling, medication management [address] [place], [place] [address] [phone] Medicaid and [facility]  [facility] [address]  [place], [place] [address] [phone] Private Insurance and Self Pay   [facility] [address]  [place], [place] [address] [phone] Private Insurance, [name], and Self Pay   [facility]  [address] [place], [place] [address] [facility]  [address]  [place], [place] [address] [phone]  [facility]  [address] [place], [place] [address] [phone]  [facility] [phone] [facility] Panning and [facility]      Residential Treatment Programs  [facility] [address] [place], [place] [address] [phone] or [phone] Detox and Residential Rehab 14 days (Medicare, Medicaid, private insurance, and self pay). No methadone. Call for pre-screen.   [facility]) [address]  [place], [place] [address] [phone] Detox (self Pay and Medicaid Limited availability) Rehab Only Male (Medicare, Medicaid, and Self Pay)-No methadone.  [facility] [address] [place], [place] [address] [phone] or [phone] Private Insurance only  [facility] [address] [place], [place] [address] Phone:  [phone] Must be detoxed 72 hours prior to admission; 28 day program.  Self-pay.  [facility] [address]  [place], [place] [phone] [employer], Medicare, [name] (not straight [name]). They offer assistance with transportation.   [facility] [address] [place],  [place], [place] [address] [phone] Christian Based Program. Men only. No insurance  [facility] [address] [place], [place] [address] Women's: [phone] Men's: [phone] No Medicaid.   [facility] [place] Locations across the [place] (mainly [place]) willing to help with transportation.  [phone] [employer]. [facility]  [address]  [place], [place] [address] [phone] Treatment Only, must make  assessment appointment, and must be sober for assessment appointment. Self pay, Medicare A and B, [facility],  must be [facility] resident. No methadone.   [address] [place], [place] [address] Phone: [phone] Inpatient 24/7 and outpatient services. Individuals with Medicaid have no obligation for a copay. Individuals with Medicare or private insurance will be obligated to meet their policy's requirement(s). Individuals who are uninsured will be eligible for a sliding or discounted scaled  [facility]  [address] [place], [place] [address] [phone] No pending legal charges, Long-term work program. No methadone. Call for assessment.  [facility]  [address], [place], [place] [address] [phone] or [phone] Commercial Insurance Only  [facility] - [phone] Private Insurance (no [name]). Males/Females, call to make referrals, multiple facilities   [facility] [address]  [place], [place] [address]  [phone] Men Only Upfront Fee   [facility]no methadone [facility] [address] [place], [place] [address] [phone] (f)         AA Meetings Website to locate meetings (virtually or in person): [email or web address].biz/ Phone: [phone]  Syringe Services Program: Due to COVID-19, syringe services programs are likely operating under different hours with limited or no fixed site hours. Some programs may not be operating at all. Please contact the program directly using the phone numbers provided below to see if they are still operating under COVID-19.  [facility] Solution to the Opioid Problem ([facility]STOP) Fixed; mobile; peer-based Roxy Cedar [phone] [email or web address] Fixed site exchange at [facility], [address] [place], [place] [address] on Wednesdays (2:00 - 5:00 pm) and Thursdays  (4:00 - 8:00 pm). Pop-up mobile exchange locations: [employer], [address] [place], [place] [address] on Tuesdays (11:00 am - 1:00 pm) and Fridays (11:00 am - 1:00 pm)  -[facility] - [address], [place], [place] [address] on Tuesdays (2:00 - 4:00 pm) and Fridays (2:00 - 4:00 pm)  -[facility] copy - also serves Radio broadcast assistant and [employer] [place] [employer]; Fixed; mobile; peer-based; Lendon Ka [phone] [email or web address] [address] [place], [place] [address] Delivery and outreach available in [place] and [place], please call for more information. Monday, Tuesday: 1:00 -7:00 pm, Thursday: 4:00 pm - 8:00 pm or Friday: 1:00 pm - 8:00 pm  Medication-Assisted Treatment (MAT):  -[facility]- services [address] and surrounding areas including [place], [place], [place], [place], [address], [place], [place], [place], [place], and [place], [place]. Options include Methadone, buprenorphine or Suboxone. [address], [place], [place] [address] Phone: [phone] Mon - Fri: 5:30am - 2:00pm Sat: 5:30am -7:30am Sun: Closed Holidays: 6:00am - 8:00am  -[facility]- We use FDA-approved medications, like methadone/suboxone/sublocade, and vivitrol. These medications are then combined with customized care plans that include individual or group counseling, toxicology, and medical care directed by on-site physicians. Accepts most insurance plans, Medicaid, and private pay.  [address] [place], [place] [address] Phone: [phone] Monday-Friday 5:00 AM - 10:00 AM Saturday 6:00 AM - 8:30 AM Sunday 6:00 AM - 7:00 AM  -[facility]- [facility] is a treatment & recovery focused program. In addition to receiving methadone medication, our clients participate in individual and group counseling as well as random drug testing. If accepted into the [facility] Opioid Program, you  will be provided several intake appointments and a physical exam [address] [place], [place] [address] Office: [phone]  Fax: [phone]  -[facility]- We put our community members at the  center of everything we do, for remote treatment services as well as in-person, from alcohol withdrawal to opioid use and more.  [address], [place], [place] [address] [phone] Monday-Wednesday: 9:00am - 5:00pm Thursday: 9:00am - 6:00pm Friday: 9:00am - 5:00pm Saturday: 9:00am - 1:00pm Sunday: Closed  -[facility] [place]) [address], [place], [place] [address] [phone]  [place] [phone] [address] [place], [place] [address]  M-W    5:00am-12:00pm Thu     5:00am-10:00am Fri       5:00am-12:00pm Sat      5:00am-8:00am Sun     Closed  $12/daily for Methadone Treatment.

## 2023-10-03 NOTE — Progress Notes (Signed)
 Initial Nutrition Assessment  DOCUMENTATION CODES:   Not applicable  INTERVENTION:   Initiate tube feeding via Cortrak tube: Osmolite 1.5 at 20 ml/h and increase by 10 ml every 8 hours to goal rate of 60 ml/hr (1440 ml per day)  Prosource TF20 60 ml BID  Provides 2320 kcal, 130 gm protein, 1094 ml free water daily  Continue folic acid, thiamine, and MVI with minerals  Juven BID  Monitor magnesium and phosphorus every 12 hours x 4 occurrences, MD to replete as needed, as pt is at risk for refeeding syndrome given low potassium/phos/mag prior to start of TF.   NUTRITION DIAGNOSIS:   Increased nutrient needs related to wound healing as evidenced by estimated needs.  GOAL:   Patient will meet greater than or equal to 90% of their needs  MONITOR:   TF tolerance  REASON FOR ASSESSMENT:   Consult Enteral/tube feeding initiation and management  ASSESSMENT:   Pt with PMH of chronic demyelinating polyneuropathy, ETOH use disorder, seizures, and hyponatremia admitted with ETOH withdrawal and seizures.   Pt discussed during ICU rounds and with RN and MD.  Pt did not rouse during my assessment or during cortrak placement. Unable to determine nutrition hx. Pt at high risk for malnutrition, suspect poor nutrition PTA.   2/28 - s/p cortrak tube; tip gastric  Medications reviewed and include: folic acid, SSI every 4 hours, lactulose 10 g daily, MVI with minerals, phenobarbital, thiamine  NS @ 100 ml/hr Mag sulfate x 1  10 mEq KCl x 6  Kphos x 1 30 mmol   Labs reviewed:  Na 126 K 3.4 Phos 1.9 Mag 1.6 A1C 8.9 (08/08/23)    NUTRITION - FOCUSED PHYSICAL EXAM:  Flowsheet Row Most Recent Value  Orbital Region No depletion  Upper Arm Region No depletion  Thoracic and Lumbar Region No depletion  Buccal Region No depletion  Temple Region No depletion  Clavicle Bone Region No depletion  Clavicle and Acromion Bone Region No depletion  Scapular Bone Region Unable to assess   Dorsal Hand Unable to assess  [edema]  Patellar Region Unable to assess  Anterior Thigh Region Unable to assess  Posterior Calf Region Unable to assess  Edema (RD Assessment) Moderate  Hair Reviewed  Eyes Unable to assess  Mouth Unable to assess  Skin Reviewed  [multiple areas noted on all extremeties]  Nails Reviewed       Diet Order:   Diet Order             Diet NPO time specified  Diet effective now                   EDUCATION NEEDS:   Not appropriate for education at this time  Skin:  Skin Assessment: Skin Integrity Issues: Skin Integrity Issues:: Other (Comment) Other: L arm full thickness to muscle  Last BM:  unknown  Height:   Ht Readings from Last 1 Encounters:  10/02/23 5\' 8"  (1.727 m)    Weight:   Wt Readings from Last 1 Encounters:  10/03/23 105 kg    BMI:  Body mass index is 35.2 kg/m.  Estimated Nutritional Needs:   Kcal:  2300-2500  Protein:  120-140 grams  Fluid:  >2 L/day  Cammy Copa., RD, LDN, CNSC See AMiON for contact information

## 2023-10-03 NOTE — Progress Notes (Addendum)
 NAME:  Connor Copeland, MRN:  [id number], DOB:  07/30/1976, LOS: 1 ADMISSION DATE:  10/02/2023, CONSULTATION DATE:  10/02/23 REFERRING MD:  Rhae Hammock, EDP, CHIEF COMPLAINT:  etoh withdrawal seizures   History of Present Illness:  48 year old male with past medical history of chronic inflammatory demyelinating polyneuropathy, alcohol use disorder, seizures, hyponatremia who presented to [facility] ED on 2/27 with altered mental status. Reported last drink 3-4 days ago. Becoming more aggressive, multiple falls. In ED, tachycardic, hypertensive, anxious. Labs notable for Na 122, K 3, Cl 73, Cr 1.26, AST 230, ALT 168, Alk phos 477, T bili 3.9, AG 18, Hgb 11.1, plt 375, ammonia 43, ethanol negative, mag 1.5, lactic 2.1. CT head with bilateral hygromas with mild mass effect no MLS. He was initially given 4mg  ativan for CT. Given 2 more mg before symptoms controlled. They also ordered thiamine, folic acid, phenobarbital (not given). He had seizure while in ED and given 2 mg ativan. CCM consulted for admission  Pertinent  Medical History  CIDP, alcohol use, seizures, hyponatremia  Significant Hospital Events: Including procedures, antibiotic start and stop dates in addition to other pertinent events   2/27: ED for etoh w/d, seizure   Interim History / Subjective:  No acute events overnight Remains on 0.2 Precedex Comfortable  Objective   Blood pressure 105/74, pulse 81, temperature 98.5 F (36.9 C), temperature source Axillary, resp. rate 16, height 5\' 8"  (1.727 m), weight 105 kg, SpO2 (!) 81%.        Intake/Output Summary (Last 24 hours) at 10/03/2023 0839 Last data filed at 10/03/2023 0600 Gross per 24 hour  Intake 4406.17 ml  Output 750 ml  Net 3656.17 ml   Filed Weights   10/02/23 1022 10/02/23 1704 10/03/23 0500  Weight: 104.3 kg 105.1 kg 105 kg    Examination:  General: overweight middle aged male  HENT: [place]/AT, Pupils 3mm and reactive.  Lungs: No distress. Satting well on 2L.  Auscultation limited by moaning. Sounds clear.  Cardiovascular: RRR, no MRG Abdomen: Soft, NT, ND Extremities: Full thickness wound to L forearm dressed with minimal strikethrough. Bilateral knee wounds dressed.  Neuro: sedate. Does not follow commands. Moans loudly with attempts to assess.   2/27                                      2/28      3L positive Tmax 98.9  No sz seen on EEG  K 2.3 > repleted by elink Anion gap 16 ? NA 127 up from 122 in 12 hrs Ca 6.1 > repleted by elink Hgb 9.5 WBC 7.2   Resolved Hospital Problem list    Assessment & Plan:  Alcohol withdrawal seizure Alcohol use disorder  Hyperammonemia  Elevated transaminases  History of alcohol withdrawal and withdrawal seizure. Reported seizure at OSH ED. Etoh negative on ED arrival. Reported last drink 3-4 days ago. On Trileptal at home  - precedex gtt - will plan to DC as he is quite somnolent on low rate - phenobarbital taper ongoing - prn ativan IV - consider lactulose - overnight eeg without evidence of sz, per neuro.  - folic acid, mtvn, thiamine  - seizure precautions  - frequent neuro checks   Hyponatremia  Hypokalemia  Hypochloremia  Hypocalcemia Hypomagnesemia  Suspect part beer potomania. Appears to have history of hyponatremia in background of dehydration, likely malnourished.  - NS @ 182ml/hr - Trend  sodium - Lytes repleted - trend bmp, mag, phos   Lactic acidosis  > cleared Likely 2/2 seizure activity.   Acute kidney injury  Anion gap metabolic acidosis  AKI in setting of pre-renal azotemia from dehydration. Gap multifactorial in setting of lactic acidosis, uremia, would suspect some starvation ketosis.  - Improved  Left upper extremity wound, POA  Open LUE wound to the forearm that is about 2x1x0.5cm to muscle. Appears to have been purulent at some time, wound base does not appear purulent  - Continue vancomycin for now - Appreciate WOC, recommending consult to hand/arm  surgery.   Bilateral hygromas with mild mass effect, no MLS  - frequent neuro checks  - stat imaging for any decompensation   Diabetes(?); not documented on patient PMH Takes metformin, linagliptin at home  - A1c - 8.9, will consult DM coordinator when clinically appropriate  - ssi  - cbg q4h   CIDP - takes cymbalta? Can reassess when clinically appropriate   Best Practice (right click and "Reselect all SmartList Selections" daily)   Diet/type: NPO DVT prophylaxis: prophylactic heparin  Pressure ulcer(s): see wound assessment  GI prophylaxis: N/A Lines: N/A Foley:  N/A Code Status:  full code Last date of multidisciplinary goals of care discussion [pending]  Labs   CBC: Recent Labs  Lab 10/02/23 1050 10/02/23 1831  WBC 9.8 7.2  HGB 11.1* 9.5*  HCT 32.6* 28.0*  MCV 91.6 91.5  PLT 375 323    Basic Metabolic Panel: Recent Labs  Lab 10/02/23 1050 10/02/23 1831 10/02/23 2234  NA 122*  --  127*  K 3.0*  --  2.3*  CL 73*  --  83*  CO2 31  --  28  GLUCOSE 89  --  62*  BUN 16  --  9  CREATININE 1.26* 1.13 1.04  CALCIUM 7.3*  --  6.1*  MG 1.5*  --   --    GFR: Estimated Creatinine Clearance: 100.9 mL/min (by C-G formula based on SCr of 1.04 mg/dL). Recent Labs  Lab 10/02/23 1050 10/02/23 1416 10/02/23 1831  WBC 9.8  --  7.2  LATICACIDVEN 2.1* 1.3  --     Liver Function Tests: Recent Labs  Lab 10/02/23 1050  AST 230*  ALT 168*  ALKPHOS 477*  BILITOT 3.9*  PROT 5.9*  ALBUMIN 2.1*   No results for input(s): "LIPASE", "AMYLASE" in the last 168 hours. Recent Labs  Lab 10/02/23 1050  AMMONIA 43*    ABG    Component Value Date/Time   HCO3 36.8 (H) 08/09/2023 2152   O2SAT 90.7 08/09/2023 2152     Coagulation Profile: Recent Labs  Lab 10/02/23 1902  INR 1.2    Cardiac Enzymes: No results for input(s): "CKTOTAL", "CKMB", "CKMBINDEX", "TROPONINI" in the last 168 hours.  HbA1C: Hgb A1c MFr Bld  Date/Time Value Ref Range Status   08/08/2023 12:39 PM 8.9 (H) 4.8 - 5.6 % Final    Comment:    (NOTE)         Prediabetes: 5.7 - 6.4         Diabetes: >6.4         Glycemic control for adults with diabetes: <7.0     CBG: Recent Labs  Lab 10/02/23 2327 10/02/23 2328 10/03/23 0011 10/03/23 0321 10/03/23 0752  GLUCAP 62* 61* 92 72 88    Review of Systems:   As above  Past Medical History:  He,  has a past medical history of CIDP (chronic inflammatory demyelinating  polyneuropathy) (HCC), ETOH abuse, and Gout.   Surgical History:   Past Surgical History:  Procedure Laterality Date   FOOT SURGERY       Social History:   reports that he has been smoking cigarettes. He has never used smokeless tobacco. He reports current alcohol use of about 42.0 standard drinks of alcohol per week. He reports that he does not use drugs.   Family History:  His family history includes COPD in his father.   Allergies Allergies  Allergen Reactions   Gabapentin Swelling    Swelling off the legs     Home Medications  Prior to Admission medications   Medication Sig Start Date End Date Taking? Authorizing Provider  metFORMIN (GLUCOPHAGE) 500 MG tablet Take 500 mg by mouth 2 (two) times daily. 08/29/23  Yes [provider]  acetaminophen (TYLENOL) 325 MG tablet Take 2 tablets (650 mg total) by mouth every 6 (six) hours as needed for mild pain (pain score 1-3) (or Fever >/= 101). 08/21/23   Angiulli, Mcarthur Rossetti, PA-C  amLODipine (NORVASC) 2.5 MG tablet Take 1 tablet (2.5 mg total) by mouth daily. 08/25/23   Angiulli, Mcarthur Rossetti, PA-C  diltiazem (CARDIZEM CD) 120 MG 24 hr capsule Take 1 capsule (120 mg total) by mouth daily. 08/25/23   Angiulli, Mcarthur Rossetti, PA-C  DULoxetine (CYMBALTA) 60 MG capsule Take 2 capsules (120 mg total) by mouth daily. 08/25/23   Angiulli, Mcarthur Rossetti, PA-C  fluticasone (FLONASE) 50 MCG/ACT nasal spray Place 1 spray into both nostrils daily. 07/15/23   [provider]  folic acid (FOLVITE) 1 MG  tablet Take 1 tablet (1 mg total) by mouth daily. 08/25/23   Angiulli, Mcarthur Rossetti, PA-C  HYDROcodone-acetaminophen (NORCO) 7.5-325 MG tablet Take 1 tablet by mouth 3 (three) times daily as needed. 08/25/23   Angiulli, Mcarthur Rossetti, PA-C  linagliptin (TRADJENTA) 5 MG TABS tablet Take 1 tablet (5 mg total) by mouth daily. 08/25/23   Angiulli, Mcarthur Rossetti, PA-C  magnesium oxide (MAG-OX) 400 (240 Mg) MG tablet Take 1 tablet (400 mg total) by mouth 2 (two) times daily. 08/25/23   Angiulli, Mcarthur Rossetti, PA-C  Multiple Vitamin (MULTIVITAMIN WITH MINERALS) TABS tablet Take 1 tablet by mouth daily. 08/21/23   Angiulli, Mcarthur Rossetti, PA-C  nicotine (NICODERM CQ - DOSED IN MG/24 HOURS) 21 mg/24hr patch 21 mg patch daily for 1 week then 14 mg patch daily for 3 weeks then 7 mg patch daily for 3 weeks and stop 08/25/23   Angiulli, Mcarthur Rossetti, PA-C  nortriptyline (PAMELOR) 25 MG capsule Take 2 capsules (50 mg total) by mouth at bedtime. 08/25/23   Angiulli, Mcarthur Rossetti, PA-C  Oxcarbazepine (TRILEPTAL) 300 MG tablet 300 milligrams daily and 600 mg nightly 08/25/23   Angiulli, Mcarthur Rossetti, PA-C  pantoprazole (PROTONIX) 40 MG tablet Take 1 tablet (40 mg total) by mouth daily. 08/25/23   Angiulli, Mcarthur Rossetti, PA-C  thiamine (VITAMIN B1) 100 MG tablet Take 1 tablet (100 mg total) by mouth daily. 08/25/23   Angiulli, Mcarthur Rossetti, PA-C  febuxostat (ULORIC) 40 MG tablet Take 1 tablet (40 mg total) by mouth daily. 08/25/23   Charlton Amor, PA-C     Critical care time: 43 min      Joneen Roach, AGACNP-BC [facility]  See Amion for personal pager PCCM on call pager [phone] until 7pm. Please call Elink 7p-7a. [phone]  10/03/2023 9:07 AM

## 2023-10-03 NOTE — TOC CM/SW Note (Signed)
 Transition of Care [facility]) - Inpatient Brief Assessment   Patient Details  Name: Connor Copeland MRN: [id number] Date of Birth: Aug 29, 1975  Transition [facility]) CM/SW Contact:    Mearl Latin, LCSW Phone Number: 10/03/2023, 11:54 AM   Clinical Narrative: Patient admitted from home alone and normally uses walker. TOC following for SA consult. Resources placed on AVS for now as patient is currently disoriented.    Transition of Care Asessment: Insurance and Status: Insurance coverage has been reviewed Patient has primary care physician: Yes Home environment has been reviewed: From home Prior level of function:: Independent Prior/Current Home Services: No current home services Social Drivers of Health Review: SDOH reviewed no interventions necessary Readmission risk has been reviewed: Yes Transition of care needs: transition of care needs identified, TOC will continue to follow

## 2023-10-03 NOTE — Procedures (Addendum)
 Patient Name: Connor Copeland  MRN: [id number]  Epilepsy Attending: Charlsie Quest  Referring Physician/Provider: Lynnell Catalan, MD  Duration: 10/02/2023 1816 to 10/03/2023 1816  Patient history: 48 year old man who presented to [facility] ED with agitation and hallucinations. Multiple witnessed seizures which stopped with ativan but behaviour still not normal. EEG to evaluate for seizure  Level of alertness: Awake, asleep  AEDs during EEG study: Phenobarb  Technical aspects: This EEG study was done with scalp electrodes positioned according to the 10-20 International system of electrode placement. Electrical activity was reviewed with band pass filter of 1-70Hz , sensitivity of 7 uV/mm, display speed of 67mm/sec with a 60Hz  notched filter applied as appropriate. EEG data were recorded continuously and digitally stored.  Video monitoring was available and reviewed as appropriate.  Description: No clear posterior dominant rhythm was seen.  EEG showed continuous generalized 3 to 6 Hz theta- delta slowing.  Sleep was characterized by sleep symptoms (12 to 14 Hz), maximal fronto-central region.  Hyperventilation and photic stimulation were not performed.     Event button was pressed on 10/03/2023 at 430 for stiffening of bilateral upper extremities.  Concomitant EEG before, during and after the event showed significant myogenic artifact and did not show any EEG to suggest seizure.  ABNORMALITY - Continuous slow, generalized  IMPRESSION: This study is suggestive of moderate diffuse encephalopathy. No seizures or epileptiform discharges were seen throughout the recording.  Event button was pressed on 10/03/2023 at 0430 for stiffening of bilateral upper extremities without concomitant EEG change.  This was most likely not an epileptic event.  Lane Kjos Annabelle Harman

## 2023-10-03 NOTE — Consult Note (Signed)
 WOC Nurse Consult Note: Reason for Consult: Consult requested for left arm wound.  Performed remotely after review of progress notes and photos in the EMR.   Pt has a full thickness wound of unknown etiology to left anterior arm, large amt slough and tan drainage. Surrounded by red macular papular lesions.  Pt is critically ill with multiple systemic factors which can impair healing.  Secure chat message sent to the primary team as follows, "Pt could benefit from a consult to the hand/arm surgical team for possible bedside debridement of left arm wound.  Please order if desired." Dressing procedure/placement/frequency: Topical treatment orders provided for bedside nurses to perform as follows to assist with removal of nonviable tissue: Apply Medihoney to left arm wound Q day, then cover with foam dressing.  Change foam dressing Q 3 days or PRN soiling. Please re-consult if further assistance is needed.  Thank-you,  Cammie Mcgee MSN, RN, CWOCN, [place], CNS [phone]

## 2023-10-03 NOTE — Procedures (Signed)
 Cortrak  Person Inserting Tube:  Greig Castilla D, RD Tube Type:  Cortrak - 43 inches Tube Size:  10 Tube Location:  Left nare Secured by: Bridle Technique Used to Measure Tube Placement:  Marking at nare/corner of mouth Cortrak Secured At:  72 cm Procedure Comments:  Cortrak Tube Team Note:  Consult received to place a Cortrak feeding tube.   No x-ray is required. RN may begin using tube.   If the tube becomes dislodged please keep the tube and contact the Cortrak team at [email or web address] for replacement.  If after hours and replacement cannot be delayed, place a NG tube and confirm placement with an abdominal x-ray.    Greig Castilla, RD, LDN Registered Dietitian II Please reach out via secure chat Weekend on-call pager # available in [facility]

## 2023-10-04 DIAGNOSIS — R569 Unspecified convulsions: Secondary | ICD-10-CM | POA: Diagnosis not present

## 2023-10-04 DIAGNOSIS — F10931 Alcohol use, unspecified with withdrawal delirium: Secondary | ICD-10-CM | POA: Diagnosis not present

## 2023-10-04 DIAGNOSIS — R451 Restlessness and agitation: Secondary | ICD-10-CM | POA: Diagnosis not present

## 2023-10-04 DIAGNOSIS — R443 Hallucinations, unspecified: Secondary | ICD-10-CM | POA: Diagnosis not present

## 2023-10-04 LAB — MAGNESIUM
Magnesium: 1.2 mg/dL — ABNORMAL LOW (ref 1.7–2.4)
Magnesium: 1.9 mg/dL (ref 1.7–2.4)

## 2023-10-04 LAB — CBC
HCT: 27.8 % — ABNORMAL LOW (ref 39.0–52.0)
Hemoglobin: 9.3 g/dL — ABNORMAL LOW (ref 13.0–17.0)
MCH: 31.5 pg (ref 26.0–34.0)
MCHC: 33.5 g/dL (ref 30.0–36.0)
MCV: 94.2 fL (ref 80.0–100.0)
Platelets: 325 10*3/uL (ref 150–400)
RBC: 2.95 MIL/uL — ABNORMAL LOW (ref 4.22–5.81)
RDW: 15.9 % — ABNORMAL HIGH (ref 11.5–15.5)
WBC: 7 10*3/uL (ref 4.0–10.5)
nRBC: 1 % — ABNORMAL HIGH (ref 0.0–0.2)

## 2023-10-04 LAB — COMPREHENSIVE METABOLIC PANEL
ALT: 111 U/L — ABNORMAL HIGH (ref 0–44)
AST: 134 U/L — ABNORMAL HIGH (ref 15–41)
Albumin: <1.5 g/dL — ABNORMAL LOW (ref 3.5–5.0)
Alkaline Phosphatase: 374 U/L — ABNORMAL HIGH (ref 38–126)
Anion gap: 8 (ref 5–15)
BUN: 6 mg/dL (ref 6–20)
CO2: 30 mmol/L (ref 22–32)
Calcium: 6.2 mg/dL — CL (ref 8.9–10.3)
Chloride: 93 mmol/L — ABNORMAL LOW (ref 98–111)
Creatinine, Ser: 0.75 mg/dL (ref 0.61–1.24)
GFR, Estimated: >60 mL/min (ref >60)
Glucose, Bld: 178 mg/dL — ABNORMAL HIGH (ref 70–99)
Potassium: 2.8 mmol/L — ABNORMAL LOW (ref 3.5–5.1)
Sodium: 131 mmol/L — ABNORMAL LOW (ref 135–145)
Total Bilirubin: 3.3 mg/dL — ABNORMAL HIGH (ref 0.0–1.2)
Total Protein: 4.5 g/dL — ABNORMAL LOW (ref 6.5–8.1)

## 2023-10-04 LAB — BASIC METABOLIC PANEL
Anion gap: 13 (ref 5–15)
BUN: 10 mg/dL (ref 6–20)
CO2: 27 mmol/L (ref 22–32)
Calcium: 6.8 mg/dL — ABNORMAL LOW (ref 8.9–10.3)
Chloride: 93 mmol/L — ABNORMAL LOW (ref 98–111)
Creatinine, Ser: 0.79 mg/dL (ref 0.61–1.24)
GFR, Estimated: >60 mL/min (ref >60)
Glucose, Bld: 163 mg/dL — ABNORMAL HIGH (ref 70–99)
Potassium: 3.9 mmol/L (ref 3.5–5.1)
Sodium: 133 mmol/L — ABNORMAL LOW (ref 135–145)

## 2023-10-04 LAB — GLUCOSE, CAPILLARY
Glucose-Capillary: 146 mg/dL — ABNORMAL HIGH (ref 70–99)
Glucose-Capillary: 174 mg/dL — ABNORMAL HIGH (ref 70–99)
Glucose-Capillary: 159 mg/dL — ABNORMAL HIGH (ref 70–99)
Glucose-Capillary: 187 mg/dL — ABNORMAL HIGH (ref 70–99)
Glucose-Capillary: 140 mg/dL — ABNORMAL HIGH (ref 70–99)
Glucose-Capillary: 167 mg/dL — ABNORMAL HIGH (ref 70–99)

## 2023-10-04 LAB — PHOSPHORUS
Phosphorus: 2 mg/dL — ABNORMAL LOW (ref 2.5–4.6)
Phosphorus: 2.3 mg/dL — ABNORMAL LOW (ref 2.5–4.6)

## 2023-10-04 MED ORDER — POLYETHYLENE GLYCOL 3350 17 G PO PACK: 17.0000 g | PACK | Freq: Every day | ORAL | Status: DC | PRN

## 2023-10-04 MED ORDER — DOCUSATE SODIUM 50 MG/5ML PO LIQD
100.0000 mg | Freq: Two times a day (BID) | ORAL | Status: DC | PRN
Start: 2023-10-04 — End: 2023-10-17

## 2023-10-04 MED ORDER — VANCOMYCIN HCL 1250 MG/250ML IV SOLN: 1250.0000 mg | Freq: Two times a day (BID) | INTRAVENOUS | Status: DC

## 2023-10-04 MED ORDER — POTASSIUM PHOSPHATES 15 MMOLE/5ML IV SOLN
30.0000 mmol | Freq: Once | INTRAVENOUS | Status: AC
  Administered 2023-10-04: 30 mmol via INTRAVENOUS
  Filled 2023-10-04: qty 10

## 2023-10-04 MED ORDER — DOCUSATE SODIUM 50 MG/5ML PO LIQD: 100.0000 mg | Freq: Two times a day (BID) | ORAL | Status: DC | PRN

## 2023-10-04 MED ORDER — FOLIC ACID 1 MG PO TABS
1.0000 mg | ORAL_TABLET | Freq: Every day | ORAL | Status: DC
  Administered 2023-10-04: 1 mg

## 2023-10-04 MED ORDER — MAGNESIUM SULFATE 4 GM/100ML IV SOLN
4.0000 g | Freq: Once | INTRAVENOUS | Status: AC
  Administered 2023-10-04: 4 g via INTRAVENOUS
  Filled 2023-10-04: qty 100

## 2023-10-04 MED ORDER — MAGNESIUM SULFATE 2 GM/50ML IV SOLN
2.0000 g | Freq: Once | INTRAVENOUS | Status: AC
  Administered 2023-10-04: 2 g via INTRAVENOUS
  Filled 2023-10-04: qty 50

## 2023-10-04 MED ORDER — FOLIC ACID 1 MG PO TABS
1.0000 mg | ORAL_TABLET | Freq: Every day | ORAL | Status: DC
  Administered 2023-10-05 – 2023-10-06 (×2): 1 mg via ORAL
  Filled 2023-10-04 (×2): qty 1

## 2023-10-04 MED ORDER — ADULT MULTIVITAMIN W/MINERALS CH
1.0000 | ORAL_TABLET | Freq: Every day | ORAL | Status: DC
  Administered 2023-10-05 – 2023-10-06 (×2): 1 via ORAL
  Filled 2023-10-04 (×2): qty 1

## 2023-10-04 MED ORDER — POTASSIUM CHLORIDE 10 MEQ/100ML IV SOLN
10.0000 meq | INTRAVENOUS | Status: AC
  Administered 2023-10-04 (×4): 10 meq via INTRAVENOUS
  Filled 2023-10-04 (×2): qty 100

## 2023-10-04 MED ORDER — DOXYCYCLINE HYCLATE 100 MG PO TABS
100.0000 mg | ORAL_TABLET | Freq: Two times a day (BID) | ORAL | Status: DC
  Administered 2023-10-04 – 2023-10-06 (×4): 100 mg via ORAL
  Filled 2023-10-04 (×6): qty 1

## 2023-10-04 MED ORDER — DOXYCYCLINE HYCLATE 100 MG PO TABS
100.0000 mg | ORAL_TABLET | Freq: Two times a day (BID) | ORAL | Status: DC
  Administered 2023-10-04: 100 mg
  Filled 2023-10-04 (×2): qty 1

## 2023-10-04 MED ORDER — JUVEN PO PACK
1.0000 | PACK | Freq: Two times a day (BID) | ORAL | Status: DC
  Administered 2023-10-04 – 2023-10-05 (×3): 1
  Filled 2023-10-04 (×2): qty 1

## 2023-10-04 MED ORDER — ADULT MULTIVITAMIN W/MINERALS CH
1.0000 | ORAL_TABLET | Freq: Every day | ORAL | Status: DC
Start: 2023-10-04 — End: 2023-10-04
  Administered 2023-10-04: 1
  Filled 2023-10-04: qty 1

## 2023-10-04 MED ORDER — POTASSIUM PHOSPHATES 15 MMOLE/5ML IV SOLN
15.0000 mmol | Freq: Once | INTRAVENOUS | Status: AC
  Administered 2023-10-04: 15 mmol via INTRAVENOUS
  Filled 2023-10-04: qty 5

## 2023-10-04 MED ORDER — LACTULOSE 10 GM/15ML PO SOLN
10.0000 g | Freq: Every day | ORAL | Status: DC
  Administered 2023-10-05 – 2023-10-08 (×4): 10 g via ORAL
  Filled 2023-10-04 (×5): qty 15

## 2023-10-04 MED ORDER — LACTULOSE 10 GM/15ML PO SOLN
10.0000 g | Freq: Every day | ORAL | Status: DC
  Administered 2023-10-04: 10 g

## 2023-10-04 MED ORDER — THIAMINE MONONITRATE 100 MG PO TABS
100.0000 mg | ORAL_TABLET | Freq: Every day | ORAL | Status: DC
  Administered 2023-10-05 – 2023-10-06 (×2): 100 mg via ORAL
  Filled 2023-10-04 (×2): qty 1

## 2023-10-04 MED ORDER — THIAMINE MONONITRATE 100 MG PO TABS
100.0000 mg | ORAL_TABLET | Freq: Every day | ORAL | Status: DC
  Administered 2023-10-04: 100 mg
  Filled 2023-10-04: qty 1

## 2023-10-04 NOTE — Progress Notes (Signed)
 NAME:  Connor Copeland, MRN:  [id number], DOB:  February 28, 1976, LOS: 2 ADMISSION DATE:  10/02/2023, CONSULTATION DATE:  10/02/23 REFERRING MD:  Rhae Hammock, EDP, CHIEF COMPLAINT:  etoh withdrawal seizures   History of Present Illness:  48 year old male with past medical history of chronic inflammatory demyelinating polyneuropathy, alcohol use disorder, seizures, hyponatremia who presented to [facility] ED on 2/27 with altered mental status. Reported last drink 3-4 days ago. Becoming more aggressive, multiple falls. In ED, tachycardic, hypertensive, anxious. Labs notable for Na 122, K 3, Cl 73, Cr 1.26, AST 230, ALT 168, Alk phos 477, T bili 3.9, AG 18, Hgb 11.1, plt 375, ammonia 43, ethanol negative, mag 1.5, lactic 2.1. CT head with bilateral hygromas with mild mass effect no MLS. He was initially given 4mg  ativan for CT. Given 2 more mg before symptoms controlled. They also ordered thiamine, folic acid, phenobarbital (not given). He had seizure while in ED and given 2 mg ativan. CCM consulted for admission  Pertinent  Medical History  CIDP, alcohol use, seizures, hyponatremia  Significant Hospital Events: Including procedures, antibiotic start and stop dates in addition to other pertinent events   2/27: ED for etoh w/d, seizure   Interim History / Subjective:   No acute events overnight Remains on Precedex.  More awake and responsive today  Objective   Blood pressure 102/78, pulse (!) 109, temperature 98.9 F (37.2 C), temperature source Axillary, resp. rate 20, height 5\' 8"  (1.727 m), weight 105 kg, SpO2 95%.        Intake/Output Summary (Last 24 hours) at 10/04/2023 0853 Last data filed at 10/04/2023 0800 Gross per 24 hour  Intake 3278.97 ml  Output 875 ml  Net 2403.97 ml   Filed Weights   10/02/23 1704 10/03/23 0500 10/04/23 0500  Weight: 105.1 kg 105 kg 105 kg    Examination:  Gen:      No acute distress HEENT:  EOMI, sclera anicteric Neck:     No masses; no thyromegaly Lungs:    Clear  to auscultation bilaterally; normal respiratory effort CV:         Regular rate and rhythm; no murmurs Abd:      + bowel sounds; soft, non-tender; no palpable masses, no distension Ext:    Wound to the left forearm Skin:      Warm and dry; no rash Neuro: Sedated, arousable.  Answers questions  Lab/imaging reviewed Significant for sodium 131, BUN/creatinine 6/0.75 AST 134, ALT 111  2/27                                      2/28                     3/1     Resolved Hospital Problem list    Assessment & Plan:  Alcohol withdrawal seizure Alcohol use disorder  Hyperammonemia  Elevated transaminases  History of alcohol withdrawal and withdrawal seizure. Reported seizure at OSH ED. Etoh negative on ED arrival. Reported last drink 3-4 days ago. On Trileptal at home  - precedex gtt.  Wean down as tolerated - phenobarbital taper ongoing - prn ativan IV - consider lactulose -EEG with no evidence of seizures.  Will discontinue study - folic acid, mtvn, thiamine  - seizure precautions  - frequent neuro checks   Hyponatremia  Hypokalemia  Hypochloremia  Hypocalcemia Hypomagnesemia  Suspect part beer potomania. Appears to have  history of hyponatremia in background of dehydration, likely malnourished.  - Trend sodium - Lytes repleted - trend bmp, mag, phos   Lactic acidosis  > cleared Likely 2/2 seizure activity.   Acute kidney injury  Anion gap metabolic acidosis  AKI in setting of pre-renal azotemia from dehydration. Gap multifactorial in setting of lactic acidosis, uremia, would suspect some starvation ketosis.  - Improved  Left upper extremity wound, POA  Open LUE wound to the forearm that is about 2x1x0.5cm to muscle. Appears to have been purulent at some time, wound base does not appear purulent  - Continue vancomycin for now -Appreciate wound care and Ortho recommendation  Bilateral hygromas with mild mass effect, no MLS  - frequent neuro checks  - stat imaging for  any decompensation   Diabetes(?); not documented on patient PMH Takes metformin, linagliptin at home  - A1c - 8.9, will consult DM coordinator when clinically appropriate  - ssi  - cbg q4h   CIDP - takes cymbalta? Can reassess when clinically appropriate   Best Practice (right click and "Reselect all SmartList Selections" daily)   Diet/type: NPO DVT prophylaxis: prophylactic heparin  Pressure ulcer(s): see wound assessment  GI prophylaxis: N/A Lines: N/A Foley:  N/A Code Status:  full code Last date of multidisciplinary goals of care discussion [pending]  Critical care time:    The patient is critically ill with multiple organ system failure and requires high complexity decision making for assessment and support, frequent evaluation and titration of therapies, advanced monitoring, review of radiographic studies and interpretation of complex data.   Critical Care Time devoted to patient care services, exclusive of separately billable procedures, described in this note is 35 minutes.   Chilton Greathouse MD [facility] See Amion for pager  If no response to pager , please call [phone] until 7pm After 7:00 pm call Elink  [phone] 10/04/2023, 9:03 AM

## 2023-10-04 NOTE — Evaluation (Signed)
 Physical Therapy Evaluation Patient Details Name: Connor Copeland MRN: [id number] DOB: 1975-09-30 Today's Date: 10/04/2023  History of Present Illness  Pt is a 48 year old male admitted 2/27 for ETOH withdrawal and seizures. PMH: chronic inflammatory demyelinating polyneuropathy, alcohol use disorder, seizures, hyponatremia   Clinical Impression  Pt admitted with above diagnosis. PTA pt lived at home alone, mod I with RW. Recent CIR stay with d/c home 08/26/23. Pt currently with functional limitations due to the deficits listed below (see PT Problem List). On eval, pt very limited by AMS and meds for agitation. Eyes closed throughout session. Pt fidgety. Following simple commands for LE movement/ROM but no others. Mumbling, unintelligible speech. Pt will benefit from acute skilled PT to increase their independence and safety with mobility to allow discharge. PT to further assess mobility as mentation clears. At this time, recommending SNF at d/c.           If plan is discharge home, recommend the following:     Can travel by private vehicle   No    Equipment Recommendations Other (comment) (TBD)  Recommendations for Other Services       Functional Status Assessment       Precautions / Restrictions Precautions Precautions: Fall;Other (comment) Precaution/Restrictions Comments: foley, coretrak, agression during admission      Mobility  Bed Mobility Overal bed mobility: Needs Assistance Bed Mobility: Supine to Sit     Supine to sit: Max assist     General bed mobility comments: Attempted transition to EOB. Pt wincing in pain and resisting. Unable to verbalize pain site but did demo grimacing with previous ROM RLE.    Transfers                   General transfer comment: will need +2 assist    Ambulation/Gait                  Stairs            Wheelchair Mobility     Tilt Bed    Modified Rankin (Stroke Patients Only)        Balance                                             Pertinent Vitals/Pain Pain Assessment Pain Assessment: Faces Faces Pain Scale: Hurts even more Pain Location: RLE with mobility/ROM Pain Descriptors / Indicators: Grimacing, Moaning Pain Intervention(s): Limited activity within patient's tolerance, Monitored during session    Home Living Family/patient expects to be discharged to:: Private residence Living Arrangements: Alone Available Help at Discharge: Family;Available PRN/intermittently Type of Home: House Home Access: Stairs to enter Entrance Stairs-Rails: Right Entrance Stairs-Number of Steps: 2 in front, 6 at back   Home Layout: One level;Laundry or work area in [employer] Equipment: Agricultural consultant (2 wheels);Cane - single point Additional Comments: History taken from previous admission. Pt d/c'd from CIR 08/26/23, back to home alone.    Prior Function Prior Level of Function : Independent/Modified Independent;Patient poor historian/Family not available             Mobility Comments: Amb with RW       Extremity/Trunk Assessment   Upper Extremity Assessment Upper Extremity Assessment: Defer to OT evaluation    Lower Extremity Assessment Lower Extremity Assessment: Generalized weakness;RLE deficits/detail RLE: Unable to fully assess due to pain  Communication   Communication Communication: Impaired Factors Affecting Communication: Reduced clarity of speech    Cognition Arousal: Obtunded Behavior During Therapy: Flat affect, Impulsive, Restless   PT - Cognitive impairments: No family/caregiver present to determine baseline, Difficult to assess Difficult to assess due to: Level of arousal                     PT - Cognition Comments: Eyes closed, not opening on command (opens his mouth instead). Following simple commands for LE ROM/movement. Mumbling, unintelligible speech Following commands: Impaired Following  commands impaired: Follows one step commands inconsistently     Cueing Cueing Techniques: Verbal cues, Tactile cues     General Comments General comments (skin integrity, edema, etc.): multiple open wounds and abrasions BUE/LE    Exercises     Assessment/Plan    PT Assessment Patient needs continued PT services  PT Problem List Decreased strength;Decreased balance;Decreased cognition;Pain;Decreased mobility;Decreased activity tolerance;Decreased safety awareness       PT Treatment Interventions Functional mobility training;Balance training;Patient/family education;Gait training;Therapeutic activities;Therapeutic exercise;Stair training;Cognitive remediation    PT Goals (Current goals can be found in the Care Plan section)  Acute Rehab PT Goals Patient Stated Goal: unable to state PT Goal Formulation: Patient unable to participate in goal setting Time For Goal Achievement: 10/18/23 Potential to Achieve Goals: Fair    Frequency Min 1X/week     Co-evaluation               AM-PAC PT "6 Clicks" Mobility  Outcome Measure Help needed turning from your back to your side while in a flat bed without using bedrails?: Total Help needed moving from lying on your back to sitting on the side of a flat bed without using bedrails?: Total Help needed moving to and from a bed to a chair (including a wheelchair)?: Total Help needed standing up from a chair using your arms (e.g., wheelchair or bedside chair)?: Total Help needed to walk in hospital room?: Total Help needed climbing 3-5 steps with a railing? : Total 6 Click Score: 6    End of Session   Activity Tolerance: Treatment limited secondary to agitation;Patient limited by fatigue (requiring meds for agitation, obtunded) Patient left: in bed;with call bell/phone within reach;with restraints reapplied Nurse Communication: Mobility status PT Visit Diagnosis: Difficulty in walking, not elsewhere classified (R26.2);Other  abnormalities of gait and mobility (R26.89)    Time: 1435-1450 PT Time Calculation (min) (ACUTE ONLY): 15 min   Charges:   PT Evaluation $PT Eval Moderate Complexity: 1 Mod   PT General Charges $$ ACUTE PT VISIT: 1 Visit         Ferd Glassing., PT  Office # [phone]   Ilda Foil 10/04/2023, 3:25 PM

## 2023-10-04 NOTE — Evaluation (Signed)
 Clinical/Bedside Swallow Evaluation Patient Details  Name: Connor Copeland MRN: [id number] Date of Birth: 03-20-1976  Today's Date: 10/04/2023 Time: SLP Start Time (ACUTE ONLY): 4098 SLP Stop Time (ACUTE ONLY): 0945 SLP Time Calculation (min) (ACUTE ONLY): 17 min  Past Medical History:  Past Medical History:  Diagnosis Date   CIDP (chronic inflammatory demyelinating polyneuropathy) (HCC)    ETOH abuse    Gout    Past Surgical History:  Past Surgical History:  Procedure Laterality Date   FOOT SURGERY     HPI:  48 year old male with past medical history of chronic inflammatory demyelinating polyneuropathy, alcohol use disorder, seizures, hyponatremia who presented to [facility] ED on 2/27 with altered mental status. Reported last drink 3-4 days ago. Becoming more aggressive, multiple falls being treated for ETOH withdrawal. Pt s/p cortrak 2/28 due to AMS.    Assessment / Plan / Recommendation  Clinical Impression  Pt presents with a cognitive based mild oral dysphagia this date. Nursing reports mentation continues to improve since admission. Pt would rouse during cueing but unable to consistently follow simple commands. SLP assessed with ice chips, thin liquids, puree, and small piece of solid. No overt s/sx of aspiration exhibited with any PO including during 3 oz water challenge. Pt did exhibit brief oral holding of water with oral swishing. Recommend initiate dysphagia 3 (mechanical soft) and thin liquids with meds as tolerated. Will plan to advance diet as tolerated as mentation suspected to continue to improve. Full supervision with POs due to current cognitive deficits. SLP notied RD via secure chat regarding PO diet initiation as pt on continues tube feeds via cortrak.  SLP Visit Diagnosis: Dysphagia, oral phase (R13.11)    Aspiration Risk  Mild aspiration risk    Diet Recommendation   Thin;Dysphagia 3 (mechanical soft)  Medication Administration: Whole meds with liquid     Other  Recommendations Oral Care Recommendations: Oral care BID    Recommendations for follow up therapy are one component of a multi-disciplinary discharge planning process, led by the attending physician.  Recommendations may be updated based on patient status, additional functional criteria and insurance authorization.  Follow up Recommendations Follow physician's recommendations for discharge plan and follow up therapies      Assistance Recommended at Discharge    Functional Status Assessment Patient has had a recent decline in their functional status and demonstrates the ability to make significant improvements in function in a reasonable and predictable amount of time.  Frequency and Duration min 1 x/week  1 week       Prognosis Prognosis for improved oropharyngeal function: Good Barriers to Reach Goals: Cognitive deficits;Time post onset      Swallow Study   General Date of Onset: 10/02/23 HPI: 48 year old male with past medical history of chronic inflammatory demyelinating polyneuropathy, alcohol use disorder, seizures, hyponatremia who presented to [facility] ED on 2/27 with altered mental status. Reported last drink 3-4 days ago. Becoming more aggressive, multiple falls being treated for ETOH withdrawal. Pt s/p cortrak 2/28 due to AMS. Type of Study: Bedside Swallow Evaluation Previous Swallow Assessment: none on file Diet Prior to this Study: NPO;Cortrak/Small bore NG tube Temperature Spikes Noted: No Respiratory Status: Nasal cannula History of Recent Intubation: No Behavior/Cognition: Distractible;Requires cueing Oral Cavity Assessment: Dry Oral Care Completed by SLP: Yes (some oral lip pursing to oral care, despite cues) Oral Cavity - Dentition: Adequate natural dentition Self-Feeding Abilities: Needs assist Patient Positioning: Upright in bed Baseline Vocal Quality: Low vocal intensity Volitional Cough: Cognitively unable to  elicit Volitional Swallow: Unable to elicit     Oral/Motor/Sensory Function Overall Oral Motor/Sensory Function: Generalized oral weakness   Ice Chips Ice chips: Within functional limits Presentation: Spoon   Thin Liquid Thin Liquid: Impaired Presentation: Straw;Cup Oral Phase Impairments: Reduced lingual movement/coordination Oral Phase Functional Implications: Oral holding;Prolonged oral transit (some oral holding with intermittent oral swishing of bolus pre swallow) Pharyngeal  Phase Impairments: Suspected delayed Swallow;Multiple swallows    Nectar Thick Nectar Thick Liquid: Not tested   Honey Thick Honey Thick Liquid: Not tested   Puree Puree: Within functional limits Presentation: Spoon   Solid     Solid: Impaired Presentation: Spoon Oral Phase Impairments: Reduced lingual movement/coordination Oral Phase Functional Implications: Prolonged oral transit Pharyngeal Phase Impairments: Suspected delayed Swallow;Multiple swallows      Ardyth Gal MA, CCC-SLP Acute Rehabilitation Services   10/04/2023,10:02 AM

## 2023-10-04 NOTE — Procedures (Signed)
 Patient Name: Connor Copeland  MRN: [id number]  Epilepsy Attending: Charlsie Quest  Referring Physician/Provider: Lynnell Catalan, MD  Duration: 10/03/2023 1816 to 10/04/2023 1126   Patient history: 48 year old man who presented to [facility] ED with agitation and hallucinations. Multiple witnessed seizures which stopped with ativan but behaviour still not normal. EEG to evaluate for seizure   Level of alertness: Awake, asleep   AEDs during EEG study: Phenobarb   Technical aspects: This EEG study was done with scalp electrodes positioned according to the 10-20 International system of electrode placement. Electrical activity was reviewed with band pass filter of 1-70Hz , sensitivity of 7 uV/mm, display speed of 16mm/sec with a 60Hz  notched filter applied as appropriate. EEG data were recorded continuously and digitally stored.  Video monitoring was available and reviewed as appropriate.   Description: No clear posterior dominant rhythm was seen. EEG showed continuous generalized 3 to 6 Hz theta- delta slowing.  Sleep was characterized by sleep symptoms (12 to 14 Hz), maximal fronto-central region. Hyperventilation and photic stimulation were not performed.     Study was technically difficult due to significant myogenic artifact.  ABNORMALITY - Continuous slow, generalized   IMPRESSION: This technically difficult study is suggestive of moderate diffuse encephalopathy. No seizures or epileptiform discharges were seen throughout the recording.   Hamda Klutts Annabelle Harman

## 2023-10-04 NOTE — Progress Notes (Signed)
 LTM EEG discontinued - no skin breakdown at [facility].

## 2023-10-04 NOTE — Progress Notes (Signed)
 Pharmacy Antibiotic Note  Connor Copeland is a 48 y.o. male admitted on 10/02/2023 with LUE  wound infection .  Pharmacy has been consulted for vancomycin dosing.  Renal fx improved. 3/1 Vancomycin 1250mg  Q 12 hr with Est AUC: 506 Scr used: 0.8 mg/dL; Vd coeff: 0.5 L/kg  Plan: Increase Vancomycin to 1250mg  IV q 12h  Monitor cultures, clinical status, renal function, vancomycin level Narrow abx as able and f/u duration   Height: 5\' 8"  (172.7 cm) Weight: 105 kg (231 lb 7.7 oz) IBW/kg (Calculated) : 68.4  Temp (24hrs), Avg:98.9 F (37.2 C), Min:98.5 F (36.9 C), Max:99.2 F (37.3 C)  Recent Labs  Lab 10/02/23 1050 10/02/23 1416 10/02/23 1831 10/02/23 2234 10/03/23 0854 10/04/23 0628  WBC 9.8  --  7.2  --  6.8 7.0  CREATININE 1.26*  --  1.13 1.04 0.94 0.75  LATICACIDVEN 2.1* 1.3  --   --   --   --     Estimated Creatinine Clearance: 131.1 mL/min (by C-G formula based on SCr of 0.75 mg/dL).    Allergies  Allergen Reactions   Neurontin [Gabapentin] Swelling    Leg swelling    Antimicrobials this admission: Vanc 2/27>  Dose adjustments this admission: 3/1 Incr vanc ot 1250mg  q12   Microbiology results: 2/27 MRSA Neg   Thank you for allowing pharmacy to be a part of this patient's care.  Alphia Moh, PharmD, BCPS, BCCP Clinical Pharmacist  Please check AMION for all [facility] Pharmacy phone numbers After 10:00 PM, call Main Pharmacy [phone]

## 2023-10-04 NOTE — Progress Notes (Signed)
 Pharmacy Electrolyte Replacement  Recent Labs:  Recent Labs    10/04/23 2019  K 3.9  MG 1.9  PHOS 2.3*  CREATININE 0.79    Low Critical Values (K </= 2.5, Phos </= 1, Mg </= 1) Present: Phos = 2.3  MD Contacted: per CCM protocol  Plan: Kphos x1

## 2023-10-05 DIAGNOSIS — F10931 Alcohol use, unspecified with withdrawal delirium: Secondary | ICD-10-CM | POA: Diagnosis not present

## 2023-10-05 DIAGNOSIS — R569 Unspecified convulsions: Secondary | ICD-10-CM | POA: Diagnosis not present

## 2023-10-05 LAB — COMPREHENSIVE METABOLIC PANEL
ALT: 124 U/L — ABNORMAL HIGH (ref 0–44)
AST: 123 U/L — ABNORMAL HIGH (ref 15–41)
Albumin: 1.6 g/dL — ABNORMAL LOW (ref 3.5–5.0)
Alkaline Phosphatase: 438 U/L — ABNORMAL HIGH (ref 38–126)
Anion gap: 17 — ABNORMAL HIGH (ref 5–15)
BUN: 10 mg/dL (ref 6–20)
CO2: 24 mmol/L (ref 22–32)
Calcium: 7.3 mg/dL — ABNORMAL LOW (ref 8.9–10.3)
Chloride: 92 mmol/L — ABNORMAL LOW (ref 98–111)
Creatinine, Ser: 0.82 mg/dL (ref 0.61–1.24)
GFR, Estimated: >60 mL/min (ref >60)
Glucose, Bld: 180 mg/dL — ABNORMAL HIGH (ref 70–99)
Potassium: 3.5 mmol/L (ref 3.5–5.1)
Sodium: 133 mmol/L — ABNORMAL LOW (ref 135–145)
Total Bilirubin: 3.9 mg/dL — ABNORMAL HIGH (ref 0.0–1.2)
Total Protein: 5.2 g/dL — ABNORMAL LOW (ref 6.5–8.1)

## 2023-10-05 LAB — GLUCOSE, CAPILLARY
Glucose-Capillary: 186 mg/dL — ABNORMAL HIGH (ref 70–99)
Glucose-Capillary: 161 mg/dL — ABNORMAL HIGH (ref 70–99)
Glucose-Capillary: 198 mg/dL — ABNORMAL HIGH (ref 70–99)
Glucose-Capillary: 201 mg/dL — ABNORMAL HIGH (ref 70–99)
Glucose-Capillary: 154 mg/dL — ABNORMAL HIGH (ref 70–99)
Glucose-Capillary: 206 mg/dL — ABNORMAL HIGH (ref 70–99)

## 2023-10-05 LAB — CBC
HCT: 33 % — ABNORMAL LOW (ref 39.0–52.0)
Hemoglobin: 10.7 g/dL — ABNORMAL LOW (ref 13.0–17.0)
MCH: 31.6 pg (ref 26.0–34.0)
MCHC: 32.4 g/dL (ref 30.0–36.0)
MCV: 97.3 fL (ref 80.0–100.0)
Platelets: 379 10*3/uL (ref 150–400)
RBC: 3.39 MIL/uL — ABNORMAL LOW (ref 4.22–5.81)
RDW: 16.6 % — ABNORMAL HIGH (ref 11.5–15.5)
WBC: 12.4 10*3/uL — ABNORMAL HIGH (ref 4.0–10.5)
nRBC: 1.8 % — ABNORMAL HIGH (ref 0.0–0.2)

## 2023-10-05 LAB — MAGNESIUM: Magnesium: 1.4 mg/dL — ABNORMAL LOW (ref 1.7–2.4)

## 2023-10-05 LAB — PHOSPHORUS: Phosphorus: 2.1 mg/dL — ABNORMAL LOW (ref 2.5–4.6)

## 2023-10-05 MED ORDER — PHENOBARBITAL 32.4 MG PO TABS
32.4000 mg | ORAL_TABLET | Freq: Three times a day (TID) | ORAL | Status: DC
Start: 2023-10-06 — End: 2023-10-08
  Administered 2023-10-06 – 2023-10-08 (×5): 32.4 mg via ORAL
  Filled 2023-10-05 (×5): qty 1

## 2023-10-05 MED ORDER — POLYETHYLENE GLYCOL 3350 17 G PO PACK: 17.0000 g | PACK | Freq: Every day | ORAL | Status: DC | PRN

## 2023-10-05 MED ORDER — MAGNESIUM SULFATE 4 GM/100ML IV SOLN
4.0000 g | Freq: Once | INTRAVENOUS | Status: AC
  Administered 2023-10-05: 4 g via INTRAVENOUS
  Filled 2023-10-05: qty 100

## 2023-10-05 MED ORDER — POTASSIUM PHOSPHATES 15 MMOLE/5ML IV SOLN
30.0000 mmol | Freq: Once | INTRAVENOUS | Status: AC
  Administered 2023-10-05: 30 mmol via INTRAVENOUS
  Filled 2023-10-05: qty 10

## 2023-10-05 MED ORDER — JUVEN PO PACK
1.0000 | PACK | Freq: Two times a day (BID) | ORAL | Status: DC
  Administered 2023-10-05 – 2023-10-06 (×2): 1 via ORAL
  Filled 2023-10-05 (×2): qty 1

## 2023-10-05 MED ORDER — DULOXETINE HCL 30 MG PO CPEP
30.0000 mg | ORAL_CAPSULE | Freq: Two times a day (BID) | ORAL | Status: DC
  Administered 2023-10-05 – 2023-10-09 (×9): 30 mg via ORAL
  Filled 2023-10-05 (×10): qty 1

## 2023-10-05 MED ORDER — PROSOURCE PLUS PO LIQD
30.0000 mL | Freq: Two times a day (BID) | ORAL | Status: DC
  Administered 2023-10-05 – 2023-10-06 (×2): 30 mL via ORAL
  Filled 2023-10-05 (×4): qty 30

## 2023-10-05 MED ORDER — MAGNESIUM SULFATE 2 GM/50ML IV SOLN
2.0000 g | Freq: Once | INTRAVENOUS | Status: AC
  Administered 2023-10-05: 2 g via INTRAVENOUS
  Filled 2023-10-05: qty 50

## 2023-10-05 MED ORDER — PHENOBARBITAL 32.4 MG PO TABS
64.8000 mg | ORAL_TABLET | Freq: Three times a day (TID) | ORAL | Status: AC
  Administered 2023-10-05 – 2023-10-06 (×4): 64.8 mg via ORAL
  Filled 2023-10-05 (×4): qty 2

## 2023-10-05 NOTE — Progress Notes (Addendum)
 NAME:  Connor Copeland, MRN:  [id number], DOB:  October 10, 1975, LOS: 3 ADMISSION DATE:  10/02/2023, CONSULTATION DATE:  10/02/23 REFERRING MD:  Rhae Hammock, EDP, CHIEF COMPLAINT:  etoh withdrawal seizures   History of Present Illness:  48 year old male with past medical history of chronic inflammatory demyelinating polyneuropathy, alcohol use disorder, seizures, hyponatremia who presented to [facility] ED on 2/27 with altered mental status. Reported last drink 3-4 days ago. Becoming more aggressive, multiple falls. In ED, tachycardic, hypertensive, anxious. Labs notable for Na 122, K 3, Cl 73, Cr 1.26, AST 230, ALT 168, Alk phos 477, T bili 3.9, AG 18, Hgb 11.1, plt 375, ammonia 43, ethanol negative, mag 1.5, lactic 2.1. CT head with bilateral hygromas with mild mass effect no MLS. He was initially given 4mg  ativan for CT. Given 2 more mg before symptoms controlled. They also ordered thiamine, folic acid, phenobarbital (not given). He had seizure while in ED and given 2 mg ativan. CCM consulted for admission  Pertinent  Medical History  CIDP, alcohol use, seizures, hyponatremia  Significant Hospital Events: Including procedures, antibiotic start and stop dates in addition to other pertinent events   2/27: ED for etoh w/d, seizure   Interim History / Subjective:   More awake today, off Precedex  Objective   Blood pressure (!) 101/56, pulse (!) 114, temperature 100.1 F (37.8 C), temperature source Axillary, resp. rate (!) 24, height 5\' 8"  (1.727 m), weight 105 kg, SpO2 90%.        Intake/Output Summary (Last 24 hours) at 10/05/2023 0944 Last data filed at 10/05/2023 0900 Gross per 24 hour  Intake 3224.9 ml  Output 750 ml  Net 2474.9 ml   Filed Weights   10/03/23 0500 10/04/23 0500 10/05/23 0702  Weight: 105 kg 105 kg 105 kg    Examination:  Blood pressure (!) 101/56, pulse (!) 114, temperature 100.1 F (37.8 C), temperature source Axillary, resp. rate (!) 24, height 5\' 8"  (1.727 m), weight 105  kg, SpO2 90%. Gen:      No acute distress HEENT:  EOMI, sclera anicteric Neck:     No masses; no thyromegaly Lungs:    Clear to auscultation bilaterally; normal respiratory effort CV:         Regular rate and rhythm; no murmurs Abd:      + bowel sounds; soft, non-tender; no palpable masses, no distension Ext: Improving left arm wound, bruises over knees Skin:      Warm and dry; no rash Neuro: Somnolent, arousable  Lab/imaging reviewed Significant for sodium 133, BUN/creatinine 10/0.82, AST 123, ALT 124 WBC 12.9, hemoglobin 10.7, platelets 379  2/27                                      2/28                     3/1                               3/2     Resolved Hospital Problem list    Assessment & Plan:  Alcohol withdrawal seizure Alcohol use disorder  Hyperammonemia  Elevated transaminases  History of alcohol withdrawal and withdrawal seizure. Reported seizure at OSH ED. Etoh negative on ED arrival. Reported last drink 3-4 days ago. On Trileptal at home  - Off Precedex drip -  phenobarbital taper ongoing - prn ativan IV - folic acid, mtvn, thiamine  - seizure precautions  - frequent neuro checks   Hyponatremia  Hypokalemia  Hypochloremia  Hypocalcemia Hypomagnesemia  Suspect part beer potomania. Appears to have history of hyponatremia in background of dehydration, likely malnourished.  - Trend sodium - Lytes repleted - trend bmp, mag, phos   Lactic acidosis  > cleared Likely 2/2 seizure activity.   Acute kidney injury  Anion gap metabolic acidosis  AKI in setting of pre-renal azotemia from dehydration. Gap multifactorial in setting of lactic acidosis, uremia, would suspect some starvation ketosis.  - Improved  Left upper extremity wound, POA  Open LUE wound to the forearm that is about 2x1x0.5cm to muscle. Appears to have been purulent at some time, wound base does not appear purulent  -Vancomycin switched to doxycycline -Appreciate wound care and Ortho  recommendation.  No need for debridement  Bilateral hygromas with mild mass effect, no MLS  - frequent neuro checks  - stat imaging for any decompensation   Diabetes(?); not documented on patient PMH Takes metformin, linagliptin at home  - ssi  - cbg q4h   CIDP -Resume Cymbalta at reduced dose  Stable for transfer out of ICU and to hospitalist service  Best Practice (right click and "Reselect all SmartList Selections" daily)   Diet/type: Regular consistency (see orders) DVT prophylaxis: prophylactic heparin  Pressure ulcer(s): see wound assessment  GI prophylaxis: N/A Lines: N/A Foley:  Yes, and it is no longer needed and removal ordered  Code Status:  full code Last date of multidisciplinary goals of care discussion [] .  Family updated 3/2  Critical care time: NA   Chilton Greathouse MD [facility] See Amion for pager  If no response to pager , please call [phone] until 7pm After 7:00 pm call Elink  [phone] 10/05/2023, 9:49 AM

## 2023-10-06 ENCOUNTER — Inpatient Hospital Stay (HOSPITAL_COMMUNITY)

## 2023-10-06 DIAGNOSIS — N179 Acute kidney failure, unspecified: Secondary | ICD-10-CM | POA: Diagnosis present

## 2023-10-06 DIAGNOSIS — R569 Unspecified convulsions: Secondary | ICD-10-CM | POA: Diagnosis not present

## 2023-10-06 DIAGNOSIS — E872 Acidosis, unspecified: Secondary | ICD-10-CM | POA: Diagnosis present

## 2023-10-06 DIAGNOSIS — F10931 Alcohol use, unspecified with withdrawal delirium: Secondary | ICD-10-CM | POA: Diagnosis not present

## 2023-10-06 DIAGNOSIS — E8729 Other acidosis: Secondary | ICD-10-CM | POA: Diagnosis present

## 2023-10-06 DIAGNOSIS — E876 Hypokalemia: Secondary | ICD-10-CM | POA: Diagnosis present

## 2023-10-06 LAB — COMPREHENSIVE METABOLIC PANEL
ALT: 111 U/L — ABNORMAL HIGH (ref 0–44)
AST: 100 U/L — ABNORMAL HIGH (ref 15–41)
Albumin: <1.5 g/dL — ABNORMAL LOW (ref 3.5–5.0)
Alkaline Phosphatase: 348 U/L — ABNORMAL HIGH (ref 38–126)
Anion gap: 11 (ref 5–15)
BUN: 12 mg/dL (ref 6–20)
CO2: 28 mmol/L (ref 22–32)
Calcium: 7.4 mg/dL — ABNORMAL LOW (ref 8.9–10.3)
Chloride: 95 mmol/L — ABNORMAL LOW (ref 98–111)
Creatinine, Ser: 0.83 mg/dL (ref 0.61–1.24)
GFR, Estimated: >60 mL/min (ref >60)
Glucose, Bld: 139 mg/dL — ABNORMAL HIGH (ref 70–99)
Potassium: 3.8 mmol/L (ref 3.5–5.1)
Sodium: 134 mmol/L — ABNORMAL LOW (ref 135–145)
Total Bilirubin: 3.8 mg/dL — ABNORMAL HIGH (ref 0.0–1.2)
Total Protein: 4.8 g/dL — ABNORMAL LOW (ref 6.5–8.1)

## 2023-10-06 LAB — CBC
HCT: 29.6 % — ABNORMAL LOW (ref 39.0–52.0)
Hemoglobin: 9.4 g/dL — ABNORMAL LOW (ref 13.0–17.0)
MCH: 31.8 pg (ref 26.0–34.0)
MCHC: 31.8 g/dL (ref 30.0–36.0)
MCV: 100 fL (ref 80.0–100.0)
Platelets: 373 10*3/uL (ref 150–400)
RBC: 2.96 MIL/uL — ABNORMAL LOW (ref 4.22–5.81)
RDW: 17.1 % — ABNORMAL HIGH (ref 11.5–15.5)
WBC: 16.5 10*3/uL — ABNORMAL HIGH (ref 4.0–10.5)
nRBC: 2.6 % — ABNORMAL HIGH (ref 0.0–0.2)

## 2023-10-06 LAB — GLUCOSE, CAPILLARY
Glucose-Capillary: 158 mg/dL — ABNORMAL HIGH (ref 70–99)
Glucose-Capillary: 146 mg/dL — ABNORMAL HIGH (ref 70–99)
Glucose-Capillary: 178 mg/dL — ABNORMAL HIGH (ref 70–99)
Glucose-Capillary: 183 mg/dL — ABNORMAL HIGH (ref 70–99)
Glucose-Capillary: 134 mg/dL — ABNORMAL HIGH (ref 70–99)
Glucose-Capillary: 127 mg/dL — ABNORMAL HIGH (ref 70–99)

## 2023-10-06 LAB — CK: Total CK: 105 U/L (ref 49–397)

## 2023-10-06 LAB — MAGNESIUM: Magnesium: 1.7 mg/dL (ref 1.7–2.4)

## 2023-10-06 LAB — PHOSPHORUS: Phosphorus: 2.4 mg/dL — ABNORMAL LOW (ref 2.5–4.6)

## 2023-10-06 MED ORDER — ALBUMIN HUMAN 25 % IV SOLN
25.0000 g | Freq: Four times a day (QID) | INTRAVENOUS | Status: AC
  Administered 2023-10-06 – 2023-10-07 (×4): 25 g via INTRAVENOUS
  Filled 2023-10-06 (×4): qty 100

## 2023-10-06 MED ORDER — JUVEN PO PACK
1.0000 | PACK | Freq: Two times a day (BID) | ORAL | Status: DC
  Administered 2023-10-06 – 2023-10-07 (×3): 1
  Filled 2023-10-06 (×3): qty 1

## 2023-10-06 MED ORDER — SODIUM CHLORIDE 0.9 % IV SOLN: INTRAVENOUS | Status: DC

## 2023-10-06 MED ORDER — ENSURE ENLIVE PO LIQD: 237.0000 mL | Freq: Two times a day (BID) | ORAL | Status: DC

## 2023-10-06 MED ORDER — MAGNESIUM SULFATE 4 GM/100ML IV SOLN
4.0000 g | Freq: Once | INTRAVENOUS | Status: AC
  Administered 2023-10-06: 4 g via INTRAVENOUS
  Filled 2023-10-06: qty 100

## 2023-10-06 MED ORDER — PROSOURCE TF20 ENFIT COMPATIBL EN LIQD
60.0000 mL | Freq: Two times a day (BID) | ENTERAL | Status: DC
  Administered 2023-10-06 – 2023-10-09 (×7): 60 mL
  Filled 2023-10-06 (×7): qty 60

## 2023-10-06 MED ORDER — QUETIAPINE FUMARATE 50 MG PO TABS
25.0000 mg | ORAL_TABLET | Freq: Every evening | ORAL | Status: DC | PRN
  Administered 2023-10-06 – 2023-10-08 (×3): 25 mg via ORAL
  Filled 2023-10-06 (×3): qty 1

## 2023-10-06 MED ORDER — ACETAMINOPHEN 325 MG PO TABS
650.0000 mg | ORAL_TABLET | Freq: Four times a day (QID) | ORAL | Status: DC | PRN
  Administered 2023-10-06 – 2023-10-09 (×4): 650 mg via ORAL
  Filled 2023-10-06 (×4): qty 2

## 2023-10-06 MED ORDER — SODIUM CHLORIDE 0.9 % IV SOLN
1.5000 g | Freq: Four times a day (QID) | INTRAVENOUS | Status: DC
  Administered 2023-10-06 – 2023-10-09 (×14): 1.5 g via INTRAVENOUS
  Filled 2023-10-06 (×15): qty 4

## 2023-10-06 MED ORDER — VANCOMYCIN HCL 1250 MG/250ML IV SOLN
1250.0000 mg | Freq: Two times a day (BID) | INTRAVENOUS | Status: DC
  Administered 2023-10-06 – 2023-10-09 (×6): 1250 mg via INTRAVENOUS
  Filled 2023-10-06 (×7): qty 250

## 2023-10-06 MED ORDER — K PHOS MONO-SOD PHOS DI & MONO 155-852-130 MG PO TABS
500.0000 mg | ORAL_TABLET | Freq: Two times a day (BID) | ORAL | Status: AC
  Administered 2023-10-06 (×2): 500 mg via NASOGASTRIC
  Filled 2023-10-06 (×2): qty 2

## 2023-10-06 NOTE — Progress Notes (Signed)
 Nutrition Follow-up  DOCUMENTATION CODES:   Not applicable  INTERVENTION:  Continue tube feeding via Cortrak tube: Osmolite 1.5 at 60 ml/hr (1440 ml per day) Prosource TF20 60 ml BID   Provides 2320 kcal, 130 gm protein, 1094 ml free water daily   Encourage PO intake with feeding assistance  Continue folic acid, thiamine, and MVI with minerals  Juven BID per tube  Ensure Enlive po BID, each supplement provides 350 kcal and 20 grams of protein. Magic cup TID with meals, each supplement provides 290 kcal and 9 grams of protein   Monitor magnesium and phosphorus every 12 hours x 4 occurrences, MD to replete as needed, as pt is at risk for refeeding syndrome given low potassium/phos/mag prior to start of TF. To end 3/4.   NUTRITION DIAGNOSIS:   Increased nutrient needs related to wound healing as evidenced by estimated needs.  - Still applicable   GOAL:   Patient will meet greater than or equal to 90% of their needs  Meeting via TF  MONITOR:   TF tolerance  REASON FOR ASSESSMENT:   Consult Enteral/tube feeding initiation and management  ASSESSMENT:   Pt with PMH of chronic demyelinating polyneuropathy, ETOH use disorder, seizures, and hyponatremia admitted with ETOH withdrawal and seizures.  2/28 - s/p cortrak tube; tip gastric  3/1 - Dysphagia 3, thin liquids 3/2 - Transferred out of ICU  Patient advanced to dysphagia 3 diet. No meals documented since diet advancement on 3/1. Per nurse tech he was very lethargic this morning and did not have breakfast, unsure how he was eating yesterday. Patient only able to open eyes on visit. Tube feeds were running at 50 ml/hr notified RN to increase to goal. Patient tolerating TF, no nausea or vomiting noted. Has been having loose stools, receiving lactulose. Last BM 3/3.   Of note Prosource and Juven changed to PO. Patient is not able to adequately consume by PO at this time, will switch to per tube.   Potassium now stable,  phos and mag have been trending low, MD repleting. Sodium low and calcium low, slightly improving.   Admit weight: 105.1 kg Current weight: 105.2 kg    Average Meal Intake: No meals documented  Nutritionally Relevant Medications: Scheduled Meds:  feeding supplement  237 mL Oral BID BM   feeding supplement (PROSource TF20)  60 mL Per Tube BID   folic acid  1 mg Oral Daily   insulin aspart  0-9 Units Subcutaneous Q4H   lactulose  10 g Oral Daily   multivitamin with minerals  1 tablet Oral Daily   nutrition supplement (JUVEN)  1 packet Per Tube BID BM   phosphorus  500 mg Per NG tube BID   thiamine  100 mg Oral Daily   Continuous Infusions:  sodium chloride     ampicillin-sulbactam (UNASYN) IV 1.5 g (10/06/23 1136)   feeding supplement (OSMOLITE 1.5 CAL) 60 mL/hr at 10/05/23 2000   magnesium sulfate bolus IVPB 4 g (10/06/23 1132)   Labs Reviewed: Sodium 134, Calcium 7.4, Phosphorus 2.4, Albumin <1.5, AST 100, ALT 111, Alkaline phosphatase  CBG ranges from 146-206 mg/dL over the last 24 hours HgbA1c 8.9    NUTRITION - FOCUSED PHYSICAL EXAM:  Flowsheet Row Most Recent Value  Orbital Region No depletion  Upper Arm Region No depletion  Thoracic and Lumbar Region No depletion  Buccal Region No depletion  Temple Region No depletion  Clavicle Bone Region No depletion  Clavicle and Acromion Bone Region No depletion  Scapular Bone Region No depletion  Dorsal Hand No depletion  Patellar Region No depletion  Anterior Thigh Region No depletion  Posterior Calf Region No depletion  Edema (RD Assessment) None  Hair Reviewed  Eyes Unable to assess  Mouth Unable to assess  Skin Reviewed  Nails Reviewed      Diet Order:   Diet Order             DIET DYS 3 Room service appropriate? No; Fluid consistency: Thin  Diet effective now                   EDUCATION NEEDS:   Not appropriate for education at this time  Skin:  Skin Assessment: Skin Integrity Issues: Skin  Integrity Issues:: Other (Comment) Other: L arm full thickness to muscle  Last BM:  10/06/23, type 7  Height:   Ht Readings from Last 1 Encounters:  10/02/23 5\' 8"  (1.727 m)    Weight:   Wt Readings from Last 1 Encounters:  10/06/23 105.2 kg    BMI:  Body mass index is 35.26 kg/m.  Estimated Nutritional Needs:   Kcal:  2300-2500  Protein:  120-140 grams  Fluid:  >2 L/day   Connor Copeland, RD Registered Dietitian  See Amion for more information

## 2023-10-06 NOTE — Progress Notes (Signed)
 SLP Cancellation Note  Patient Details Name: Connor Copeland MRN: [id number] DOB: 02/26/1976   Cancelled treatment:       Reason Eval/Treat Not Completed: Medical issues which prohibited therapy. Per RN, pt in active withdrawal and not appropriate for POs at this time. He reports that he was coughing with thin liquids earlier today and plan is to make him NPO for now (does have Cortrak). SLP will f/u as appropriate.    Mahala Menghini., M.A. CCC-SLP Acute Rehabilitation Services Office [phone]  Secure chat preferred  10/06/2023, 3:26 PM

## 2023-10-06 NOTE — Progress Notes (Signed)
 [facility] PROGRESS NOTE    Progress Note  Connor Copeland  GNF:[id number] DOB: August 19, 1975 DOA: 10/02/2023 PCP: Benita Stabile, MD     Brief Narrative:   Connor Copeland is an 48 y.o. male past medical history of chronic inflammatory demyelinating polyneuropathy, alcohol disorder seizure presents to [facility] with altered mental status, last drink 3 to 4 days prior to admission.  He did became aggressive and falling in the ED was found to have been severe alcohol withdrawal mated under PCCM started on Precedex   Assessment/Plan:   Alcohol withdrawal seizure (HCC) Last drink was 4 days prior to admission. Started on Precedex drip and phenobarbital taper. Started on thiamine and folate. Continue seizure precautions.  Hydro electrolytic imbalance/Hyponatremia/hypokalemia/hypocalcemia/hypomagnesemia: Suspect likely hypovolemic. Try to keep magnesium greater than 2 phosphorus greater than 3 and potassium greater than 4.  New onset of fevers with a light rising leukocytosis: Check chest x-ray started empirically on IV Unasyn, as concern for aspiration. Change doxy to IV vanc.  High anion gap metabolic acidosis/lactic acidosis: Likely due to seizure plus or minus starvation ketosis activity now clear with conservative management.  Transaminitis: Check CK. Urine is significantly concentrated.  Acute kidney injury: Hemodynamically mediated. Resolved with IV fluid resuscitation.  Left upper extremity wounds present on admission: Change on IV vancomycin .  Incidental bilateral hygromas: Continue to monitor.  Diabetes mellitus type 2/hypoglycemia: At home he was using metformin and linagliptin. Oral hypoglycemic agents were held started on sliding scale insulin. For his hypoglycemia, was started on tube feedings.  Severe protein caloric malnutrition: On tube feeding.  CIDP: Continue Cymbalta.  Nutrition: Currently with a track and tube  feedings.   DVT prophylaxis: lovenox Family Communication:none Status is: Inpatient Remains inpatient appropriate because: Acute alcohol withdrawal    Code Status:     Code Status Orders  (From admission, onward)           Start     Ordered   10/02/23 1701  Full code  Continuous       Question:  By:  Answer:  Default: patient does not have capacity for decision making, no surrogate or prior directive available   10/02/23 1704           Code Status History     Date Active Date Inactive Code Status Order ID Comments User Context   08/19/2023 1701 08/26/2023 1602 Full Code [id number]  Lynnae Prude Inpatient   08/19/2023 1701 08/19/2023 1701 Full Code [id number]  Lynnae Prude Inpatient   08/08/2023 1441 08/19/2023 1655 Full Code [id number]  Kendell Bane, MD ED   01/28/2023 1158 02/03/2023 1751 Full Code [id number]  Cleora Fleet, MD ED         IV Access:   Peripheral IV   Procedures and diagnostic studies:   DG Chest Port 1 View Result Date: 10/06/2023 CLINICAL DATA:  Acute respiratory failure. EXAM: PORTABLE CHEST 1 VIEW COMPARISON:  01/28/2023 FINDINGS: Low volume film with asymmetric elevation of the right hemidiaphragm, similar to prior. Vascular congestion without focal consolidation or overt edema. No pleural effusion. Cardiopericardial silhouette is at upper limits of normal for size. A feeding tube passes into the stomach although the distal tip position is not included on the film. Telemetry leads overlie the chest. IMPRESSION: Low volume film with vascular congestion. Electronically Signed   By: Kennith Center M.D.   On: 10/06/2023 09:47     Medical Consultants:   None.   Subjective:  Connor Copeland confused this morning.  Objective:    Vitals:   10/06/23 0800 10/06/23 0900 10/06/23 1200 10/06/23 1243  BP: 90/64 111/74 119/69   Pulse: (!) 117 (!) 117 (!) 114   Resp: (!) 23 (!) 27 (!) 23   Temp: 98.9 F (37.2  C)   (!) 101 F (38.3 C)  TempSrc: Axillary     SpO2: 100% 100% 100%   Weight:      Height:       SpO2: 100 % O2 Flow Rate (L/min): 2 L/min   Intake/Output Summary (Last 24 hours) at 10/06/2023 1440 Last data filed at 10/06/2023 0600 Gross per 24 hour  Intake 1408.69 ml  Output 950 ml  Net 458.69 ml   Filed Weights   10/04/23 0500 10/05/23 0702 10/06/23 0500  Weight: 105 kg 105 kg 105.2 kg    Exam: General exam: In no acute distress. Respiratory system: Good air movement and clear to auscultation. Cardiovascular system: S1 & S2 heard, RRR. No JVD. Gastrointestinal system: Abdomen is nondistended, soft and nontender.  Extremities: No pedal edema. Skin: No rashes, lesions or ulcers Psychiatry: no Judgement or  insight in medical condition..   Data Reviewed:    Labs: Basic Metabolic Panel: Recent Labs  Lab 10/03/23 0854 10/04/23 1610 10/04/23 2019 10/05/23 0819 10/06/23 0312  NA 126* 131* 133* 133* 134*  K 3.4* 2.8* 3.9 3.5 3.8  CL 86* 93* 93* 92* 95*  CO2 25 30 27 24 28   GLUCOSE 80 178* 163* 180* 139*  BUN 9 6 10 10 12   CREATININE 0.94 0.75 0.79 0.82 0.83  CALCIUM 6.3* 6.2* 6.8* 7.3* 7.4*  MG 1.6* 1.2* 1.9 1.4* 1.7  PHOS 1.9* 2.0* 2.3* 2.1* 2.4*   GFR Estimated Creatinine Clearance: 126.5 mL/min (by C-G formula based on SCr of 0.83 mg/dL). Liver Function Tests: Recent Labs  Lab 10/02/23 1050 10/04/23 0628 10/05/23 0819 10/06/23 0312  AST 230* 134* 123* 100*  ALT 168* 111* 124* 111*  ALKPHOS 477* 374* 438* 348*  BILITOT 3.9* 3.3* 3.9* 3.8*  PROT 5.9* 4.5* 5.2* 4.8*  ALBUMIN 2.1* <1.5* 1.6* <1.5*   No results for input(s): "LIPASE", "AMYLASE" in the last 168 hours. Recent Labs  Lab 10/02/23 1050  AMMONIA 43*   Coagulation profile Recent Labs  Lab 10/02/23 1902  INR 1.2   COVID-19 Labs  No results for input(s): "DDIMER", "FERRITIN", "LDH", "CRP" in the last 72 hours.  No results found for: "SARSCOV2NAA"  CBC: Recent Labs  Lab  10/02/23 1831 10/03/23 0854 10/04/23 0628 10/05/23 0819 10/06/23 0312  WBC 7.2 6.8 7.0 12.4* 16.5*  HGB 9.5* 10.1* 9.3* 10.7* 9.4*  HCT 28.0* 29.5* 27.8* 33.0* 29.6*  MCV 91.5 92.8 94.2 97.3 100.0  PLT 323 329 325 379 373   Cardiac Enzymes: No results for input(s): "CKTOTAL", "CKMB", "CKMBINDEX", "TROPONINI" in the last 168 hours. BNP (last 3 results) No results for input(s): "PROBNP" in the last 8760 hours. CBG: Recent Labs  Lab 10/05/23 1936 10/05/23 2313 10/06/23 0313 10/06/23 0813 10/06/23 1128  GLUCAP 154* 206* 158* 146* 178*   D-Dimer: No results for input(s): "DDIMER" in the last 72 hours. Hgb A1c: No results for input(s): "HGBA1C" in the last 72 hours. Lipid Profile: No results for input(s): "CHOL", "HDL", "LDLCALC", "TRIG", "CHOLHDL", "LDLDIRECT" in the last 72 hours. Thyroid function studies: No results for input(s): "TSH", "T4TOTAL", "T3FREE", "THYROIDAB" in the last 72 hours.  Invalid input(s): "FREET3" Anemia work up: No results for input(s): "VITAMINB12", "  FOLATE", "FERRITIN", "TIBC", "IRON", "RETICCTPCT" in the last 72 hours. Sepsis Labs: Recent Labs  Lab 10/02/23 1050 10/02/23 1416 10/02/23 1831 10/03/23 0854 10/04/23 0628 10/05/23 0819 10/06/23 0312  WBC 9.8  --    < > 6.8 7.0 12.4* 16.5*  LATICACIDVEN 2.1* 1.3  --   --   --   --   --    < > = values in this interval not displayed.   Microbiology Recent Results (from the past 240 hours)  MRSA Next Gen by PCR, Nasal     Status: None   Collection Time: 10/02/23  6:09 PM   Specimen: Nasal Mucosa; Nasal Swab  Result Value Ref Range Status   MRSA by PCR Next Gen NOT DETECTED NOT DETECTED Final    Comment: (NOTE) The GeneXpert MRSA Assay (FDA approved for NASAL specimens only), is one component of a comprehensive MRSA colonization surveillance program. It is not intended to diagnose MRSA infection nor to guide or monitor treatment for MRSA infections. Test performance is not FDA approved in  patients less than 45 years old. Performed at [facility], [address], [place], [place] [address]      Medications:    Chlorhexidine Gluconate Cloth  6 each Topical Daily   DULoxetine  30 mg Oral BID   feeding supplement  237 mL Oral BID BM   feeding supplement (PROSource TF20)  60 mL Per Tube BID   folic acid  1 mg Oral Daily   heparin  5,000 Units Subcutaneous Q8H   insulin aspart  0-9 Units Subcutaneous Q4H   lactulose  10 g Oral Daily   leptospermum manuka honey  1 Application Topical Daily   lidocaine  1 Application Urethral Once   multivitamin with minerals  1 tablet Oral Daily   nutrition supplement (JUVEN)  1 packet Per Tube BID BM   mouth rinse  15 mL Mouth Rinse 4 times per day   phenobarbital  32.4 mg Oral Q8H   phosphorus  500 mg Per NG tube BID   thiamine  100 mg Oral Daily   Continuous Infusions:  sodium chloride 75 mL/hr at 10/06/23 1356   albumin human     ampicillin-sulbactam (UNASYN) IV 1.5 g (10/06/23 1136)   feeding supplement (OSMOLITE 1.5 CAL) 60 mL/hr at 10/05/23 2000   vancomycin        LOS: 4 days   Marinda Elk  [facility]  10/06/2023, 2:40 PM

## 2023-10-06 NOTE — Progress Notes (Addendum)
 [facility] PROGRESS NOTE    Progress Note  Connor Copeland  WUJ:[id number] DOB: 1976/03/16 DOA: 10/02/2023 PCP: Benita Stabile, MD     Brief Narrative:   Connor Copeland is an 48 y.o. male past medical history of chronic inflammatory demyelinating polyneuropathy, alcohol disorder seizure presents to [facility] with altered mental status, last drink 3 to 4 days prior to admission.  He did became aggressive and falling in the ED was found to have been severe alcohol withdrawal mated under PCCM started on Precedex   Assessment/Plan:   Alcohol withdrawal seizure (HCC) Last drink was 4 days prior to admission. Started on Precedex drip and phenobarbital taper. Started on thiamine and folate. Continue seizure precautions.  Hydro electrolytic imbalance/Hyponatremia/hypokalemia/hypocalcemia/hypomagnesemia: Suspect likely hypovolemic. Try to keep magnesium greater than 2 phosphorus greater than 3 and potassium greater than 4.  New onset of fevers with a light rising leukocytosis: Check chest x-ray started empirically on IV Unasyn, as concern for aspiration. Change doxy to IV vanc.  High anion gap metabolic acidosis/lactic acidosis: Likely due to seizure plus or minus starvation ketosis activity now clear with conservative management.  Acute kidney injury: Hemodynamically mediated. Resolved with IV fluid resuscitation.  Left upper extremity wounds present on admission: Change on IV vancomycin .  Incidental bilateral hygromas: Continue to monitor.  Diabetes mellitus type 2/hypoglycemia: At home he was using metformin and linagliptin. Oral hypoglycemic agents were held started on sliding scale insulin. For his hypoglycemia, was started on tube feedings.  Severe protein caloric malnutrition: On tube feeding.  CIDP: Continue Cymbalta.  Nutrition: Currently with a track and tube feedings.   DVT prophylaxis: lovenox Family Communication:none Status is:  Inpatient Remains inpatient appropriate because: Acute alcohol withdrawal    Code Status:     Code Status Orders  (From admission, onward)           Start     Ordered   10/02/23 1701  Full code  Continuous       Question:  By:  Answer:  Default: patient does not have capacity for decision making, no surrogate or prior directive available   10/02/23 1704           Code Status History     Date Active Date Inactive Code Status Order ID Comments User Context   08/19/2023 1701 08/26/2023 1602 Full Code [id number]  Lynnae Prude Inpatient   08/19/2023 1701 08/19/2023 1701 Full Code [id number]  Lynnae Prude Inpatient   08/08/2023 1441 08/19/2023 1655 Full Code [id number]  Kendell Bane, MD ED   01/28/2023 1158 02/03/2023 1751 Full Code [id number]  Cleora Fleet, MD ED         IV Access:   Peripheral IV   Procedures and diagnostic studies:   No results found.   Medical Consultants:   None.   Subjective:    Connor Copeland confused  Objective:    Vitals:   10/06/23 0400 10/06/23 0413 10/06/23 0500 10/06/23 0600  BP: 94/60 115/66  115/66  Pulse:  (!) 119    Resp: 18     Temp: 98.8 F (37.1 C)     TempSrc: Oral     SpO2:      Weight:   105.2 kg   Height:       SpO2: 94 % O2 Flow Rate (L/min): 2 L/min   Intake/Output Summary (Last 24 hours) at 10/06/2023 6440 Last data filed at 10/06/2023 0600 Gross per 24 hour  Intake 2205.96 ml  Output 950 ml  Net 1255.96 ml   Filed Weights   10/04/23 0500 10/05/23 0702 10/06/23 0500  Weight: 105 kg 105 kg 105.2 kg    Exam: General exam: In no acute distress. Respiratory system: Good air movement and clear to auscultation. Cardiovascular system: S1 & S2 heard, RRR. No JVD. Gastrointestinal system: Abdomen is nondistended, soft and nontender.  Extremities: No pedal edema. Skin: No rashes, lesions or ulcers Psychiatry: no Judgement or  insight in medical condition..    Data  Reviewed:    Labs: Basic Metabolic Panel: Recent Labs  Lab 10/03/23 0854 10/04/23 0981 10/04/23 2019 10/05/23 0819 10/06/23 0312  NA 126* 131* 133* 133* 134*  K 3.4* 2.8* 3.9 3.5 3.8  CL 86* 93* 93* 92* 95*  CO2 25 30 27 24 28   GLUCOSE 80 178* 163* 180* 139*  BUN 9 6 10 10 12   CREATININE 0.94 0.75 0.79 0.82 0.83  CALCIUM 6.3* 6.2* 6.8* 7.3* 7.4*  MG 1.6* 1.2* 1.9 1.4* 1.7  PHOS 1.9* 2.0* 2.3* 2.1* 2.4*   GFR Estimated Creatinine Clearance: 126.5 mL/min (by C-G formula based on SCr of 0.83 mg/dL). Liver Function Tests: Recent Labs  Lab 10/02/23 1050 10/04/23 0628 10/05/23 0819 10/06/23 0312  AST 230* 134* 123* 100*  ALT 168* 111* 124* 111*  ALKPHOS 477* 374* 438* 348*  BILITOT 3.9* 3.3* 3.9* 3.8*  PROT 5.9* 4.5* 5.2* 4.8*  ALBUMIN 2.1* <1.5* 1.6* <1.5*   No results for input(s): "LIPASE", "AMYLASE" in the last 168 hours. Recent Labs  Lab 10/02/23 1050  AMMONIA 43*   Coagulation profile Recent Labs  Lab 10/02/23 1902  INR 1.2   COVID-19 Labs  No results for input(s): "DDIMER", "FERRITIN", "LDH", "CRP" in the last 72 hours.  No results found for: "SARSCOV2NAA"  CBC: Recent Labs  Lab 10/02/23 1831 10/03/23 0854 10/04/23 0628 10/05/23 0819 10/06/23 0312  WBC 7.2 6.8 7.0 12.4* 16.5*  HGB 9.5* 10.1* 9.3* 10.7* 9.4*  HCT 28.0* 29.5* 27.8* 33.0* 29.6*  MCV 91.5 92.8 94.2 97.3 100.0  PLT 323 329 325 379 373   Cardiac Enzymes: No results for input(s): "CKTOTAL", "CKMB", "CKMBINDEX", "TROPONINI" in the last 168 hours. BNP (last 3 results) No results for input(s): "PROBNP" in the last 8760 hours. CBG: Recent Labs  Lab 10/05/23 1117 10/05/23 1547 10/05/23 1936 10/05/23 2313 10/06/23 0313  GLUCAP 198* 201* 154* 206* 158*   D-Dimer: No results for input(s): "DDIMER" in the last 72 hours. Hgb A1c: No results for input(s): "HGBA1C" in the last 72 hours. Lipid Profile: No results for input(s): "CHOL", "HDL", "LDLCALC", "TRIG", "CHOLHDL",  "LDLDIRECT" in the last 72 hours. Thyroid function studies: No results for input(s): "TSH", "T4TOTAL", "T3FREE", "THYROIDAB" in the last 72 hours.  Invalid input(s): "FREET3" Anemia work up: No results for input(s): "VITAMINB12", "FOLATE", "FERRITIN", "TIBC", "IRON", "RETICCTPCT" in the last 72 hours. Sepsis Labs: Recent Labs  Lab 10/02/23 1050 10/02/23 1416 10/02/23 1831 10/03/23 0854 10/04/23 0628 10/05/23 0819 10/06/23 0312  WBC 9.8  --    < > 6.8 7.0 12.4* 16.5*  LATICACIDVEN 2.1* 1.3  --   --   --   --   --    < > = values in this interval not displayed.   Microbiology Recent Results (from the past 240 hours)  MRSA Next Gen by PCR, Nasal     Status: None   Collection Time: 10/02/23  6:09 PM   Specimen: Nasal Mucosa; Nasal Swab  Result  Value Ref Range Status   MRSA by PCR Next Gen NOT DETECTED NOT DETECTED Final    Comment: (NOTE) The GeneXpert MRSA Assay (FDA approved for NASAL specimens only), is one component of a comprehensive MRSA colonization surveillance program. It is not intended to diagnose MRSA infection nor to guide or monitor treatment for MRSA infections. Test performance is not FDA approved in patients less than 11 years old. Performed at [facility], [address], [place], [place] [address]      Medications:    (feeding supplement) PROSource Plus  30 mL Oral BID BM   Chlorhexidine Gluconate Cloth  6 each Topical Daily   doxycycline  100 mg Oral Q12H   DULoxetine  30 mg Oral BID   folic acid  1 mg Oral Daily   heparin  5,000 Units Subcutaneous Q8H   insulin aspart  0-9 Units Subcutaneous Q4H   lactulose  10 g Oral Daily   leptospermum manuka honey  1 Application Topical Daily   lidocaine  1 Application Urethral Once   multivitamin with minerals  1 tablet Oral Daily   nutrition supplement (JUVEN)  1 packet Oral BID BM   mouth rinse  15 mL Mouth Rinse 4 times per day   phenobarbital  64.8 mg Oral Q8H   Followed by   phenobarbital   32.4 mg Oral Q8H   thiamine  100 mg Oral Daily   Continuous Infusions:  sodium chloride Stopped (10/05/23 0051)   feeding supplement (OSMOLITE 1.5 CAL) 60 mL/hr at 10/05/23 2000      LOS: 4 days   Marinda Elk  [facility]  10/06/2023, 6:38 AM

## 2023-10-06 NOTE — Progress Notes (Signed)
 PT Cancellation Note  Patient Details Name: EZRA DENNE MRN: [id number] DOB: [birth year]   Cancelled Treatment:    Reason Eval/Treat Not Completed: Medical issues which prohibited therapy, pt in active withdrawal and with fever, RN requesting PT hold at this time for best participation in session.   Vickki Muff, PT, DPT   Acute Rehabilitation Department Office [phone] Secure Chat Communication Preferred   Ronnie Derby 10/06/2023, 3:53 PM

## 2023-10-06 NOTE — Progress Notes (Signed)
 Pharmacy Antibiotic Note  Connor Copeland is a 48 y.o. male admitted on 10/02/2023 with  cellulitis .  Pharmacy has been consulted for vancomycin dosing.  Plan: Vancomycin 1250mg  IV q 12h (eAUC 506) F/u renal func, LOT, transition to back to enteral therapy  Height: 5\' 8"  (172.7 cm) Weight: 105.2 kg (231 lb 14.8 oz) IBW/kg (Calculated) : 68.4  Temp (24hrs), Avg:99.5 F (37.5 C), Min:98.2 F (36.8 C), Max:101.3 F (38.5 C)  Recent Labs  Lab 10/02/23 1050 10/02/23 1416 10/02/23 1831 10/02/23 2234 10/03/23 0854 10/04/23 0628 10/04/23 2019 10/05/23 0819 10/06/23 0312  WBC 9.8  --  7.2  --  6.8 7.0  --  12.4* 16.5*  CREATININE 1.26*  --  1.13   < > 0.94 0.75 0.79 0.82 0.83  LATICACIDVEN 2.1* 1.3  --   --   --   --   --   --   --    < > = values in this interval not displayed.    Estimated Creatinine Clearance: 126.5 mL/min (by C-G formula based on SCr of 0.83 mg/dL).    Allergies  Allergen Reactions   Neurontin [Gabapentin] Swelling    Leg swelling    Antimicrobials this admission: Vanc 2/27 >>3/1; 3/3> Doxy 3/1 >> 3/3 Unasyn 3/3 >   Dose adjustments this admission:   Microbiology results: 2/27 MRSA PCR - negative  Thank you for allowing pharmacy to be a part of this patient's care.  Calton Dach, PharmD, BCCCP Clinical Pharmacist 10/06/2023 12:34 PM

## 2023-10-07 DIAGNOSIS — R569 Unspecified convulsions: Secondary | ICD-10-CM | POA: Diagnosis not present

## 2023-10-07 DIAGNOSIS — F10931 Alcohol use, unspecified with withdrawal delirium: Secondary | ICD-10-CM | POA: Diagnosis not present

## 2023-10-07 LAB — BASIC METABOLIC PANEL
Anion gap: 13 (ref 5–15)
BUN: 12 mg/dL (ref 6–20)
CO2: 27 mmol/L (ref 22–32)
Calcium: 7.5 mg/dL — ABNORMAL LOW (ref 8.9–10.3)
Chloride: 96 mmol/L — ABNORMAL LOW (ref 98–111)
Creatinine, Ser: 0.61 mg/dL (ref 0.61–1.24)
GFR, Estimated: >60 mL/min (ref >60)
Glucose, Bld: 169 mg/dL — ABNORMAL HIGH (ref 70–99)
Potassium: 3.3 mmol/L — ABNORMAL LOW (ref 3.5–5.1)
Sodium: 136 mmol/L (ref 135–145)

## 2023-10-07 LAB — CBC WITH DIFFERENTIAL/PLATELET
Abs Immature Granulocytes: 1.3 10*3/uL — ABNORMAL HIGH (ref 0.00–0.07)
Basophils Absolute: 0.1 10*3/uL (ref 0.0–0.1)
Basophils Relative: 1 %
Eosinophils Absolute: 0.1 10*3/uL (ref 0.0–0.5)
Eosinophils Relative: 0 %
HCT: 29.2 % — ABNORMAL LOW (ref 39.0–52.0)
Hemoglobin: 9 g/dL — ABNORMAL LOW (ref 13.0–17.0)
Immature Granulocytes: 9 %
Lymphocytes Relative: 26 %
Lymphs Abs: 3.9 10*3/uL (ref 0.7–4.0)
MCH: 31.5 pg (ref 26.0–34.0)
MCHC: 30.8 g/dL (ref 30.0–36.0)
MCV: 102.1 fL — ABNORMAL HIGH (ref 80.0–100.0)
Monocytes Absolute: 2.1 10*3/uL — ABNORMAL HIGH (ref 0.1–1.0)
Monocytes Relative: 14 %
Neutro Abs: 7.4 10*3/uL (ref 1.7–7.7)
Neutrophils Relative %: 50 %
Platelets: 374 10*3/uL (ref 150–400)
RBC: 2.86 MIL/uL — ABNORMAL LOW (ref 4.22–5.81)
RDW: 17.1 % — ABNORMAL HIGH (ref 11.5–15.5)
WBC: 14.9 10*3/uL — ABNORMAL HIGH (ref 4.0–10.5)
nRBC: 2.4 % — ABNORMAL HIGH (ref 0.0–0.2)

## 2023-10-07 LAB — PHOSPHORUS: Phosphorus: 2.3 mg/dL — ABNORMAL LOW (ref 2.5–4.6)

## 2023-10-07 LAB — GLUCOSE, CAPILLARY
Glucose-Capillary: 178 mg/dL — ABNORMAL HIGH (ref 70–99)
Glucose-Capillary: 195 mg/dL — ABNORMAL HIGH (ref 70–99)
Glucose-Capillary: 176 mg/dL — ABNORMAL HIGH (ref 70–99)
Glucose-Capillary: 169 mg/dL — ABNORMAL HIGH (ref 70–99)
Glucose-Capillary: 182 mg/dL — ABNORMAL HIGH (ref 70–99)
Glucose-Capillary: 114 mg/dL — ABNORMAL HIGH (ref 70–99)

## 2023-10-07 LAB — MAGNESIUM: Magnesium: 1.6 mg/dL — ABNORMAL LOW (ref 1.7–2.4)

## 2023-10-07 MED ORDER — ADULT MULTIVITAMIN W/MINERALS CH
1.0000 | ORAL_TABLET | Freq: Every day | ORAL | Status: DC
  Administered 2023-10-07: 1
  Filled 2023-10-07: qty 1

## 2023-10-07 MED ORDER — INSULIN GLARGINE 100 UNIT/ML ~~LOC~~ SOLN
5.0000 [IU] | Freq: Every day | SUBCUTANEOUS | Status: DC
  Administered 2023-10-07 – 2023-10-09 (×3): 5 [IU] via SUBCUTANEOUS
  Filled 2023-10-07 (×3): qty 0.05

## 2023-10-07 MED ORDER — POTASSIUM PHOSPHATES 15 MMOLE/5ML IV SOLN
30.0000 mmol | Freq: Once | INTRAVENOUS | Status: AC
  Administered 2023-10-07: 30 mmol via INTRAVENOUS
  Filled 2023-10-07 (×3): qty 10

## 2023-10-07 MED ORDER — WHITE PETROLATUM EX OINT
TOPICAL_OINTMENT | CUTANEOUS | Status: DC | PRN
  Administered 2023-10-07: 0.2 via TOPICAL
  Filled 2023-10-07: qty 28.35

## 2023-10-07 MED ORDER — SODIUM CHLORIDE 0.9 % IV SOLN: INTRAVENOUS | Status: AC

## 2023-10-07 MED ORDER — THIAMINE MONONITRATE 100 MG PO TABS
100.0000 mg | ORAL_TABLET | Freq: Every day | ORAL | Status: DC
  Administered 2023-10-07: 100 mg
  Filled 2023-10-07: qty 1

## 2023-10-07 MED ORDER — MAGNESIUM SULFATE 4 GM/100ML IV SOLN
4.0000 g | Freq: Once | INTRAVENOUS | Status: AC
  Administered 2023-10-07: 4 g via INTRAVENOUS
  Filled 2023-10-07: qty 100

## 2023-10-07 MED ORDER — THIAMINE MONONITRATE 100 MG PO TABS: 500.0000 mg | ORAL_TABLET | Freq: Every day | ORAL | Status: DC

## 2023-10-07 MED ORDER — FOLIC ACID 1 MG PO TABS
1.0000 mg | ORAL_TABLET | Freq: Every day | ORAL | Status: DC
  Administered 2023-10-07: 1 mg
  Filled 2023-10-07: qty 1

## 2023-10-07 MED ORDER — INSULIN GLARGINE-YFGN 100 UNIT/ML ~~LOC~~ SOLN: 5.0000 [IU] | Freq: Every day | SUBCUTANEOUS | Status: DC

## 2023-10-07 MED ORDER — GERHARDT'S BUTT CREAM
TOPICAL_CREAM | CUTANEOUS | Status: DC | PRN
  Filled 2023-10-07 (×2): qty 60

## 2023-10-07 NOTE — Progress Notes (Signed)
 Physical Therapy Treatment Patient Details Name: Connor Copeland MRN: [id number] DOB: [birth year] Today's Date: 10/07/2023   History of Present Illness Pt is a 48 year old male admitted 2/27 for ETOH withdrawal and seizures. PMH: chronic inflammatory demyelinating polyneuropathy, alcohol use disorder, seizures, hyponatremia    PT Comments  Patient primarily kept eyes closed throughout the entirety of today's session. Patient requires Total A +2 or bed mobility and transfers, requiring external support to elevate trunk and move bilateral LEs EOB, maintain an upright posture and limit hyperextension of the trunk, and guarding the knees. Deferred transfer from bed to chair due to increased discomfort and patient's inability to distribute weight through his legs. Pt will benefit from acute skilled PT to increase their independence and safety with mobility to allow discharge.   If plan is discharge home, recommend the following: Assistance with cooking/housework;Assistance with feeding;Direct supervision/assist for medications management;Direct supervision/assist for financial management;Assist for transportation;Help with stairs or ramp for entrance;Two people to help with walking and/or transfers;Two people to help with bathing/dressing/bathroom   Can travel by private vehicle     No  Equipment Recommendations  Other (comment)    Recommendations for Other Services       Precautions / Restrictions Precautions Precautions: Fall Precaution/Restrictions Comments: bil mittens, foley, cortrak, R wrist restraint, Restrictions Weight Bearing Restrictions Per Provider Order: No     Mobility  Bed Mobility Overal bed mobility: Needs Assistance Bed Mobility: Supine to Sit, Sit to Supine     Supine to sit: +2 for physical assistance, Total assist Sit to supine: +2 for physical assistance, Total assist   General bed mobility comments: MaxA +2 to assisst with trunk elevation and moving  bilateral legs EOB    Transfers Overall transfer level: Needs assistance Equipment used: 2 person hand held assist Transfers: Bed to chair/wheelchair/BSC             General transfer comment: Attempted and deferred transfer to chair due to patient's increased discomfort. Patient sat EOB for the entirety of session    Ambulation/Gait                   Stairs             Wheelchair Mobility     Tilt Bed    Modified Rankin (Stroke Patients Only)       Balance Overall balance assessment: Needs assistance Sitting-balance support: Bilateral upper extremity supported, Feet supported Sitting balance-Leahy Scale: Poor Sitting balance - Comments: External support from therapists to maintain upright posture. Hyperextended posturing, knees have to be blocked Postural control: Posterior lean Standing balance support: No upper extremity supported Standing balance-Leahy Scale: Zero Standing balance comment: Attempted, patient unable to transition from a squat > stand                            Communication Communication Communication: Impaired Factors Affecting Communication: Reduced clarity of speech  Cognition Arousal: Obtunded Behavior During Therapy: Flat affect, Impulsive, Restless   PT - Cognitive impairments: Difficult to assess Difficult to assess due to: Level of arousal                     PT - Cognition Comments: Eyes closed for majority of session, inconsistently follow commands. Following simple commands for LE ROM/movement. Mumbling, unintelligible speech Following commands: Impaired Following commands impaired: Follows one step commands inconsistently    Cueing Cueing Techniques: Verbal cues, Tactile cues  Exercises  General Comments        Pertinent Vitals/Pain Pain Assessment Pain Assessment: Faces Faces Pain Scale: Hurts little more Pain Location: All Over Pain Descriptors / Indicators: Grimacing,  Moaning Pain Intervention(s): Limited activity within patient's tolerance, Monitored during session, Repositioned    Home Living                          Prior Function            PT Goals (current goals can now be found in the care plan section) Acute Rehab PT Goals Patient Stated Goal: unable to state PT Goal Formulation: Patient unable to participate in goal setting Time For Goal Achievement: 10/18/23 Potential to Achieve Goals: Fair Progress towards PT goals: Progressing toward goals    Frequency    Min 1X/week      PT Plan      Co-evaluation   Reason for Co-Treatment: For patient/therapist safety;Necessary to address cognition/behavior during functional activity;Complexity of the patient's impairments (multi-system involvement);To address functional/ADL transfers   OT goals addressed during session: ADL's and self-care;Proper use of Adaptive equipment and DME;Strengthening/ROM      AM-PAC PT "6 Clicks" Mobility   Outcome Measure  Help needed turning from your back to your side while in a flat bed without using bedrails?: Total Help needed moving from lying on your back to sitting on the side of a flat bed without using bedrails?: Total Help needed moving to and from a bed to a chair (including a wheelchair)?: Total Help needed standing up from a chair using your arms (e.g., wheelchair or bedside chair)?: Total Help needed to walk in hospital room?: Total Help needed climbing 3-5 steps with a railing? : Total 6 Click Score: 6    End of Session Equipment Utilized During Treatment: Gait belt Activity Tolerance: Patient limited by pain;Patient limited by lethargy Patient left: in bed;with call bell/phone within reach;with bed alarm set;with restraints reapplied;with family/visitor present Nurse Communication: Precautions;Mobility status PT Visit Diagnosis: Difficulty in walking, not elsewhere classified (R26.2);Other abnormalities of gait and mobility  (R26.89)     Time: 1610-9604 PT Time Calculation (min) (ACUTE ONLY): 32 min  Charges:    $Therapeutic Activity: 8-22 mins PT General Charges $$ ACUTE PT VISIT: 1 Visit                     Doreen Beam, SPT   Connor Copeland 10/07/2023, 1:40 PM

## 2023-10-07 NOTE — Plan of Care (Signed)
 Problem: Education: Goal: Ability to describe self-care measures that may prevent or decrease complications (Diabetes Survival Skills Education) will improve 10/07/2023 2212 by Carlene Coria, RN Outcome: Progressing 10/07/2023 2048 by Carlene Coria, RN Outcome: Progressing Goal: Individualized Educational Video(s) 10/07/2023 2212 by Carlene Coria, RN Outcome: Progressing 10/07/2023 2048 by Carlene Coria, RN Outcome: Progressing   Problem: Coping: Goal: Ability to adjust to condition or change in health will improve 10/07/2023 2212 by Carlene Coria, RN Outcome: Progressing 10/07/2023 2048 by Carlene Coria, RN Outcome: Progressing   Problem: Fluid Volume: Goal: Ability to maintain a balanced intake and output will improve 10/07/2023 2212 by Carlene Coria, RN Outcome: Progressing 10/07/2023 2048 by Carlene Coria, RN Outcome: Progressing   Problem: Health Behavior/Discharge Planning: Goal: Ability to identify and utilize available resources and services will improve 10/07/2023 2212 by Carlene Coria, RN Outcome: Progressing 10/07/2023 2048 by Carlene Coria, RN Outcome: Progressing Goal: Ability to manage health-related needs will improve 10/07/2023 2212 by Carlene Coria, RN Outcome: Progressing 10/07/2023 2048 by Carlene Coria, RN Outcome: Progressing   Problem: Metabolic: Goal: Ability to maintain appropriate glucose levels will improve 10/07/2023 2212 by Carlene Coria, RN Outcome: Progressing 10/07/2023 2048 by Carlene Coria, RN Outcome: Progressing   Problem: Nutritional: Goal: Maintenance of adequate nutrition will improve 10/07/2023 2212 by Carlene Coria, RN Outcome: Progressing 10/07/2023 2048 by Carlene Coria, RN Outcome: Progressing Goal: Progress toward achieving an optimal weight will improve 10/07/2023 2212 by Carlene Coria, RN Outcome: Progressing 10/07/2023 2048 by Carlene Coria, RN Outcome:  Progressing   Problem: Skin Integrity: Goal: Risk for impaired skin integrity will decrease 10/07/2023 2212 by Carlene Coria, RN Outcome: Progressing 10/07/2023 2048 by Carlene Coria, RN Outcome: Progressing   Problem: Tissue Perfusion: Goal: Adequacy of tissue perfusion will improve 10/07/2023 2212 by Carlene Coria, RN Outcome: Progressing 10/07/2023 2048 by Carlene Coria, RN Outcome: Progressing   Problem: Education: Goal: Knowledge of General Education information will improve Description: Including pain rating scale, medication(s)/side effects and non-pharmacologic comfort measures 10/07/2023 2212 by Carlene Coria, RN Outcome: Progressing 10/07/2023 2048 by Carlene Coria, RN Outcome: Progressing   Problem: Health Behavior/Discharge Planning: Goal: Ability to manage health-related needs will improve 10/07/2023 2212 by Carlene Coria, RN Outcome: Progressing 10/07/2023 2048 by Carlene Coria, RN Outcome: Progressing   Problem: Clinical Measurements: Goal: Ability to maintain clinical measurements within normal limits will improve 10/07/2023 2212 by Carlene Coria, RN Outcome: Progressing 10/07/2023 2048 by Carlene Coria, RN Outcome: Progressing Goal: Will remain free from infection 10/07/2023 2212 by Carlene Coria, RN Outcome: Progressing 10/07/2023 2048 by Carlene Coria, RN Outcome: Progressing Goal: Diagnostic test results will improve 10/07/2023 2212 by Carlene Coria, RN Outcome: Progressing 10/07/2023 2048 by Carlene Coria, RN Outcome: Progressing Goal: Respiratory complications will improve 10/07/2023 2212 by Carlene Coria, RN Outcome: Progressing 10/07/2023 2048 by Carlene Coria, RN Outcome: Progressing Goal: Cardiovascular complication will be avoided 10/07/2023 2212 by Carlene Coria, RN Outcome: Progressing 10/07/2023 2048 by Carlene Coria, RN Outcome: Progressing   Problem: Activity: Goal: Risk  for activity intolerance will decrease 10/07/2023 2212 by Carlene Coria, RN Outcome: Progressing 10/07/2023 2048 by Carlene Coria, RN Outcome: Progressing   Problem: Nutrition: Goal: Adequate nutrition will be maintained 10/07/2023 2212 by Carlene Coria, RN Outcome: Progressing 10/07/2023 2048 by Carlene Coria, RN Outcome: Progressing  Problem: Coping: Goal: Level of anxiety will decrease 10/07/2023 2212 by Carlene Coria, RN Outcome: Progressing 10/07/2023 2048 by Carlene Coria, RN Outcome: Progressing   Problem: Elimination: Goal: Will not experience complications related to bowel motility 10/07/2023 2212 by Carlene Coria, RN Outcome: Progressing 10/07/2023 2048 by Carlene Coria, RN Outcome: Progressing Goal: Will not experience complications related to urinary retention 10/07/2023 2212 by Carlene Coria, RN Outcome: Progressing 10/07/2023 2048 by Carlene Coria, RN Outcome: Progressing   Problem: Pain Managment: Goal: General experience of comfort will improve and/or be controlled 10/07/2023 2212 by Carlene Coria, RN Outcome: Progressing 10/07/2023 2048 by Carlene Coria, RN Outcome: Progressing   Problem: Safety: Goal: Ability to remain free from injury will improve 10/07/2023 2212 by Carlene Coria, RN Outcome: Progressing 10/07/2023 2048 by Carlene Coria, RN Outcome: Progressing   Problem: Skin Integrity: Goal: Risk for impaired skin integrity will decrease 10/07/2023 2212 by Carlene Coria, RN Outcome: Progressing 10/07/2023 2048 by Carlene Coria, RN Outcome: Progressing   Problem: Safety: Goal: Non-violent Restraint(s) 10/07/2023 2212 by Carlene Coria, RN Outcome: Progressing 10/07/2023 2048 by Carlene Coria, RN Outcome: Progressing

## 2023-10-07 NOTE — Progress Notes (Signed)
 Speech Language Pathology Treatment: Dysphagia  Patient Details Name: Connor Copeland MRN: [id number] DOB: Jul 15, 1976 Today's Date: 10/07/2023 Time: 1308-6578 SLP Time Calculation (min) (ACUTE ONLY): 19 min  Assessment / Plan / Recommendation Clinical Impression  Pt primarily kept his eyes closed this morning and did not verbalize much. Sister at the bedside says he has been more lethargic since getting his morning meds. She also shares that although pt typically swallows well, he had been experiencing significant odynophagia for a few weeks PTA, so much so that he would yell out when trying to swallow. She says that MD is starting tx for thrush. Pt does have facial grimacing when trying to swallow a small sip which could be related to this. He also has oral holding with ice chips and does not consistently elicit a swallow today, which may be more impacted by mentation. When attempting to use oral suction, pt bites down on the yankauer and does not allow it into his mouth. Pt is likely to benefit from increased time for medical issues to be addressed, and prognosis should be improved with reduction of sedating medications and management of thrush/odynophagia.    HPI HPI: 48 year old male with past medical history of chronic inflammatory demyelinating polyneuropathy, alcohol use disorder, seizures, hyponatremia who presented to [facility] ED on 2/27 with altered mental status. Reported last drink 3-4 days ago. Becoming more aggressive, multiple falls being treated for ETOH withdrawal. Pt s/p cortrak 2/28 due to AMS.      SLP Plan  Continue with current plan of care      Recommendations for follow up therapy are one component of a multi-disciplinary discharge planning process, led by the attending physician.  Recommendations may be updated based on patient status, additional functional criteria and insurance authorization.    Recommendations  Diet recommendations: NPO Medication Administration:  Via alternative means                  Oral care QID     Dysphagia, oral phase (R13.11)     Continue with current plan of care     Mahala Menghini., M.A. CCC-SLP Acute Rehabilitation Services Office [phone]  Secure chat preferred   10/07/2023, 10:49 AM

## 2023-10-07 NOTE — Evaluation (Signed)
 Occupational Therapy Evaluation Patient Details Name: Connor Copeland MRN: [id number] DOB: [birth year] Today's Date: 10/07/2023   History of Present Illness   Pt is a 48 year old male admitted 2/27 for ETOH withdrawal and seizures. PMH: chronic inflammatory demyelinating polyneuropathy, alcohol use disorder, seizures, hyponatremia     Clinical Impressions PT admitted with ETOH withdrawal with seizures. Pt currently with functional limitiations due to the deficits listed below (see OT problem list). Pt at this time with full body extension with any mobility and requires a set of two skilled hands. Recommendation for RN staff to use the bed to help with flexion in a chair position x3 per day to promote upright posture.  Pt will benefit from skilled OT to increase their independence and safety with adls and balance to allow discharge skilled inpatient follow up therapy, <3 hours/day. .      If plan is discharge home, recommend the following:   Two people to help with bathing/dressing/bathroom     Functional Status Assessment   Patient has had a recent decline in their functional status and demonstrates the ability to make significant improvements in function in a reasonable and predictable amount of time.     Equipment Recommendations   Wheelchair (measurements OT);Wheelchair cushion (measurements OT);Hospital bed;Hoyer lift     Recommendations for Other Services   Speech consult;PT consult     Precautions/Restrictions   Precautions Precautions: Fall Precaution/Restrictions Comments: bil mittens, foley, cortrak, R wrist restraint, Restrictions Weight Bearing Restrictions Per Provider Order: No     Mobility Bed Mobility Overal bed mobility: Needs Assistance Bed Mobility: Supine to Sit, Sit to Supine     Supine to sit: +2 for physical assistance, Total assist Sit to supine: +2 for physical assistance, Total assist   General bed mobility comments: MaxA +2 to  assisst with trunk elevation and moving bilateral legs EOB    Transfers Overall transfer level: Needs assistance Equipment used: 2 person hand held assist Transfers: Bed to chair/wheelchair/BSC             General transfer comment: Attempted and deferred transfer to chair due to patient's increased discomfort. Patient sat EOB for the entirety of session      Balance Overall balance assessment: Needs assistance Sitting-balance support: Bilateral upper extremity supported, Feet supported Sitting balance-Leahy Scale: Poor Sitting balance - Comments: External support from therapists to maintain upright posture. Hyperextended posturing, knees have to be blocked Postural control: Posterior lean Standing balance support: No upper extremity supported Standing balance-Leahy Scale: Zero Standing balance comment: Attempted, patient unable to transition from a squat > stand                           ADL either performed or assessed with clinical judgement   ADL                                         General ADL Comments: requires (A) with all adls total (A)     Vision   Additional Comments: keeps eyes closed >95% of session so unable to test. pt does not visually attend     Perception         Praxis         Pertinent Vitals/Pain Pain Assessment Faces Pain Scale: Hurts little more Pain Location: all over Pain Descriptors / Indicators: Grimacing Pain Intervention(s): Limited activity within  patient's tolerance, Monitored during session, Premedicated before session, Repositioned     Extremity/Trunk Assessment Upper Extremity Assessment Upper Extremity Assessment: Generalized weakness   Lower Extremity Assessment Lower Extremity Assessment: Generalized weakness       Communication Communication Communication: Impaired Factors Affecting Communication: Reduced clarity of speech   Cognition Arousal: Obtunded Behavior During Therapy: Flat  affect, Restless Cognition: Cognition impaired             OT - Cognition Comments: pt keeping eyes closed even with command to open eyes. when therapist report they are leaving the patient opens eyes in response. Pt verbalized he was falling and pushing into full extension despite education he isnt moving                 Following commands: Impaired Following commands impaired: Follows one step commands inconsistently     Cueing  General Comments   Cueing Techniques: Verbal cues;Tactile cues  multiple wounds to BUE. pt with bandages overall wounds   Exercises Exercises:  (does not tolerate PROM due to pain)   Shoulder Instructions      Home Living Family/patient expects to be discharged to:: Private residence Living Arrangements: Alone Available Help at Discharge: Family;Available PRN/intermittently Type of Home: House Home Access: Stairs to enter [employer] of Steps: 2 in front, 6 at back Entrance Stairs-Rails: Right Home Layout: One level;Laundry or work area in basement     [employer] Shower/Tub: Producer, television/film/video: Standard Bathroom Accessibility: Yes How Accessible: Accessible via walker Home Equipment: Agricultural consultant (2 wheels);Cane - single point   Additional Comments: History taken from previous admission. Pt d/c'd from CIR 08/26/23, back to home alone.      Prior Functioning/Environment Prior Level of Function : Independent/Modified Independent;Patient poor historian/Family not available             Mobility Comments: Amb with RW ADLs Comments: Reports independence with ADL's.    OT Problem List: Decreased strength;Decreased range of motion;Decreased activity tolerance;Impaired balance (sitting and/or standing);Impaired vision/perception;Decreased coordination;Decreased cognition;Decreased safety awareness;Decreased knowledge of use of DME or AE;Decreased knowledge of precautions;Cardiopulmonary status limiting  activity;Impaired sensation;Impaired tone;Obesity;Impaired UE functional use;Pain   OT Treatment/Interventions: Self-care/ADL training;Therapeutic exercise;Neuromuscular education;Energy conservation;DME and/or AE instruction;Manual therapy;Modalities;Therapeutic activities;Cognitive remediation/compensation;Visual/perceptual remediation/compensation;Balance training;Patient/family education      OT Goals(Current goals can be found in the care plan section)   Acute Rehab OT Goals Patient Stated Goal: none stated OT Goal Formulation: Patient unable to participate in goal setting Time For Goal Achievement: 10/21/23 Potential to Achieve Goals: Good   OT Frequency:  Min 2X/week    Co-evaluation PT/OT/SLP Co-Evaluation/Treatment: Yes Reason for Co-Treatment: For patient/therapist safety;Necessary to address cognition/behavior during functional activity;Complexity of the patient's impairments (multi-system involvement);To address functional/ADL transfers   OT goals addressed during session: ADL's and self-care;Proper use of Adaptive equipment and DME;Strengthening/ROM      AM-PAC OT "6 Clicks" Daily Activity     Outcome Measure Help from another person eating meals?: Total Help from another person taking care of personal grooming?: Total Help from another person toileting, which includes using toliet, bedpan, or urinal?: Total Help from another person bathing (including washing, rinsing, drying)?: Total Help from another person to put on and taking off regular upper body clothing?: Total Help from another person to put on and taking off regular lower body clothing?: Total 6 Click Score: 6   End of Session Equipment Utilized During Treatment: Gait belt Nurse Communication: Mobility status;Precautions;Need for lift equipment  Activity Tolerance: Patient  limited by pain Patient left: in bed;with call bell/phone within reach;with bed alarm set;with family/visitor present;with restraints  reapplied (RN educated that sister wants restraints off while she is present)  OT Visit Diagnosis: Unsteadiness on feet (R26.81);Muscle weakness (generalized) (M62.81)                Time: 1610-9604 OT Time Calculation (min): 32 min Charges:  OT General Charges $OT Visit: 1 Visit OT Evaluation $OT Eval Moderate Complexity: 1 Mod   Brynn, OTR/L  Acute Rehabilitation Services Office: [phone] .   Mateo Flow 10/07/2023, 2:20 PM

## 2023-10-07 NOTE — Progress Notes (Addendum)
 Nutrition Follow-up  DOCUMENTATION CODES:   Not applicable  INTERVENTION:  Continue tube feeding via Cortrak tube: Osmolite 1.5 at 60 ml/hr (1440 ml per day) Prosource TF20 60 ml BID FWF per MD  Provides 2320 kcal, 130 gm protein, 1094 ml free water daily   Continue folic acid and MVI with minerals per tube Increase Thiamine to 500 mg daily x 5 days then 100 mg Thiamine daily   Juven BID per tube    Monitor magnesium and phosphorus  daily x 3 occurrences, MD to replete as needed, as pt is at risk for refeeding syndrome   NUTRITION DIAGNOSIS:   Increased nutrient needs related to wound healing as evidenced by estimated needs.  - Still applicable   GOAL:   Patient will meet greater than or equal to 90% of their needs  - Meeting via TF  MONITOR:   TF tolerance  REASON FOR ASSESSMENT:   Consult Enteral/tube feeding initiation and management  ASSESSMENT:   Pt with PMH of chronic demyelinating polyneuropathy, ETOH use disorder, seizures, and hyponatremia admitted with ETOH withdrawal and seizures.  2/28 - s/p cortrak tube; tip gastric  3/1 - Dysphagia 3, thin liquids 3/2 - Transferred out of ICU 3/4 - NPO    Patient placed NPO yesterday afternoon, per RN patient in active withdrawal and has been coughing with thin liquids. SLP recommended he be NPO for now. Tolerating TF at goal. MD suspects hypovolemic and beer potomania. Has been having loose stools, receiving lactulose. Last BM 3/3.   Due to decrease in cognitive status and history of alcohol abuse recommend high does Thiamine and then resume normal supplementation daily.    Admit weight: 105.1 kg Current weight: 111.4 kg    Average Meal Intake: NPO  Nutritionally Relevant Medications: Scheduled Meds:  feeding supplement  237 mL Oral BID BM   feeding supplement (PROSource TF20)  60 mL Per Tube BID   folic acid  1 mg Per Tube Daily   insulin aspart  0-9 Units Subcutaneous Q4H   lactulose  10 g Oral Daily    multivitamin with minerals  1 tablet Per Tube Daily   nutrition supplement (JUVEN)  1 packet Per Tube BID BM   thiamine  100 mg Per Tube Daily   Continuous Infusions:  sodium chloride 75 mL/hr at 10/06/23 1710   albumin human 25 g (10/07/23 0140)   ampicillin-sulbactam (UNASYN) IV 1.5 g (10/07/23 0518)   feeding supplement (OSMOLITE 1.5 CAL) 60 mL/hr at 10/06/23 1710   vancomycin 1,250 mg (10/06/23 2104)   Labs Reviewed: Sodium 134, Chloride 95, Calcium 7.4, Phosphorus 2.4 CBG ranges from 134-195 mg/dL over the last 24 hours HgbA1c 8.9  No updated labs today?  Diet Order:   Diet Order     None       EDUCATION NEEDS:   Not appropriate for education at this time  Skin:  Skin Assessment: Skin Integrity Issues: Skin Integrity Issues:: Other (Comment) Other: L arm full thickness to muscle  Last BM:  10/06/23, type 6  Height:   Ht Readings from Last 1 Encounters:  10/02/23 5\' 8"  (1.727 m)    Weight:   Wt Readings from Last 1 Encounters:  10/07/23 (P) 111.4 kg    BMI:  Body mass index is 37.34 kg/m (pended).  Estimated Nutritional Needs:   Kcal:  2300-2500  Protein:  120-140 grams  Fluid:  >2 L/day   Elliot Dally, RD Registered Dietitian  See Amion for more information

## 2023-10-07 NOTE — Progress Notes (Signed)
 [facility] PROGRESS NOTE    Progress Note  JOREL GRAVLIN  WUJ:[id number] DOB: October 05, 1975 DOA: 10/02/2023 PCP: Benita Stabile, MD     Brief Narrative:   Connor Copeland is an 48 y.o. male past medical history of chronic inflammatory demyelinating polyneuropathy, alcohol disorder seizure presents to [facility] with altered mental status, last drink 3 to 4 days prior to admission.  He did became aggressive and falling in the ED was found to have been severe alcohol withdrawal mated under PCCM started on Precedex   Assessment/Plan:   Alcohol withdrawal seizure (HCC): Last drink was 4 days prior to admission. Continue barbital taper and Ativan as needed. Started on thiamine and folate. Continue seizure precautions.  Hydro electrolytic imbalance/Hyponatremia/hypokalemia/hypocalcemia/hypomagnesemia: Suspect likely hypovolemic. Try to keep magnesium greater than 2 phosphorus greater than 3 and potassium greater than 4. Basic metabolic panels pending this morning.  New onset of fevers with a light rising leukocytosis: Continue empirically on IV Unasyn and vancomycin. No acute findings low lung volumes  High anion gap metabolic acidosis/lactic acidosis: Likely due to seizure plus or minus starvation ketosis activity now clear with conservative management.  Transaminitis: They are trending down CK is 105.  Acute kidney injury: Hemodynamically mediated. Resolved with IV fluid resuscitation.  Left upper extremity wounds present on admission: Continue IV vancomycin  Incidental bilateral hygromas: Continue to monitor.  Diabetes mellitus type 2/hypoglycemia: At home he was using metformin and linagliptin. Oral hypoglycemic agents were held started on sliding scale insulin. For his hypoglycemia, was started on tube feedings.  Severe protein caloric malnutrition: On tube feeding.  CIDP: Continue Cymbalta.  Nutrition: Currently with a track and tube  feedings.   DVT prophylaxis: lovenox Family Communication:none Status is: Inpatient Remains inpatient appropriate because: Acute alcohol withdrawal    Code Status:     Code Status Orders  (From admission, onward)           Start     Ordered   10/02/23 1701  Full code  Continuous       Question:  By:  Answer:  Default: patient does not have capacity for decision making, no surrogate or prior directive available   10/02/23 1704           Code Status History     Date Active Date Inactive Code Status Order ID Comments User Context   08/19/2023 1701 08/26/2023 1602 Full Code [id number]  Lynnae Prude Inpatient   08/19/2023 1701 08/19/2023 1701 Full Code [id number]  Lynnae Prude Inpatient   08/08/2023 1441 08/19/2023 1655 Full Code [id number]  Kendell Bane, MD ED   01/28/2023 1158 02/03/2023 1751 Full Code [id number]  Cleora Fleet, MD ED         IV Access:   Peripheral IV   Procedures and diagnostic studies:   DG Chest Port 1 View Result Date: 10/06/2023 CLINICAL DATA:  Acute respiratory failure. EXAM: PORTABLE CHEST 1 VIEW COMPARISON:  01/28/2023 FINDINGS: Low volume film with asymmetric elevation of the right hemidiaphragm, similar to prior. Vascular congestion without focal consolidation or overt edema. No pleural effusion. Cardiopericardial silhouette is at upper limits of normal for size. A feeding tube passes into the stomach although the distal tip position is not included on the film. Telemetry leads overlie the chest. IMPRESSION: Low volume film with vascular congestion. Electronically Signed   By: Kennith Center M.D.   On: 10/06/2023 09:47     Medical Consultants:   None.  Subjective:    YAHSHUA THIBAULT awake this morning moaning and groaning  Objective:    Vitals:   10/07/23 0252 10/07/23 0300 10/07/23 0600 10/07/23 0749  BP: 113/66 113/69 109/66 100/68  Pulse: 96 91  98  Resp:  20  19  Temp:  97.8 F (36.6 C)   98.2 F (36.8 C)  TempSrc:  Axillary  Oral  SpO2:  96%  100%  Weight:  (P) 111.4 kg    Height:       SpO2: 100 % O2 Flow Rate (L/min): 2 L/min   Intake/Output Summary (Last 24 hours) at 10/07/2023 0818 Last data filed at 10/07/2023 0610 Gross per 24 hour  Intake 3651.23 ml  Output 1500 ml  Net 2151.23 ml   Filed Weights   10/05/23 0702 10/06/23 0500 10/07/23 0300  Weight: 105 kg 105.2 kg (P) 111.4 kg    Exam: General exam: In no acute distress. Respiratory system: Good air movement and clear to auscultation. Cardiovascular system: S1 & S2 heard, RRR. No JVD. Gastrointestinal system: Abdomen is nondistended, soft and nontender.  Extremities: No pedal edema. Skin: No rashes, lesions or ulcers Psychiatry: No judgment or insight of medical condition   Data Reviewed:    Labs: Basic Metabolic Panel: Recent Labs  Lab 10/03/23 0854 10/04/23 0628 10/04/23 2019 10/05/23 0819 10/06/23 0312  NA 126* 131* 133* 133* 134*  K 3.4* 2.8* 3.9 3.5 3.8  CL 86* 93* 93* 92* 95*  CO2 25 30 27 24 28   GLUCOSE 80 178* 163* 180* 139*  BUN 9 6 10 10 12   CREATININE 0.94 0.75 0.79 0.82 0.83  CALCIUM 6.3* 6.2* 6.8* 7.3* 7.4*  MG 1.6* 1.2* 1.9 1.4* 1.7  PHOS 1.9* 2.0* 2.3* 2.1* 2.4*   GFR Estimated Creatinine Clearance: 126.5 mL/min (by C-G formula based on SCr of 0.83 mg/dL). Liver Function Tests: Recent Labs  Lab 10/02/23 1050 10/04/23 0628 10/05/23 0819 10/06/23 0312  AST 230* 134* 123* 100*  ALT 168* 111* 124* 111*  ALKPHOS 477* 374* 438* 348*  BILITOT 3.9* 3.3* 3.9* 3.8*  PROT 5.9* 4.5* 5.2* 4.8*  ALBUMIN 2.1* <1.5* 1.6* <1.5*   No results for input(s): "LIPASE", "AMYLASE" in the last 168 hours. Recent Labs  Lab 10/02/23 1050  AMMONIA 43*   Coagulation profile Recent Labs  Lab 10/02/23 1902  INR 1.2   COVID-19 Labs  No results for input(s): "DDIMER", "FERRITIN", "LDH", "CRP" in the last 72 hours.  No results found for: "SARSCOV2NAA"  CBC: Recent Labs  Lab  10/02/23 1831 10/03/23 0854 10/04/23 0628 10/05/23 0819 10/06/23 0312  WBC 7.2 6.8 7.0 12.4* 16.5*  HGB 9.5* 10.1* 9.3* 10.7* 9.4*  HCT 28.0* 29.5* 27.8* 33.0* 29.6*  MCV 91.5 92.8 94.2 97.3 100.0  PLT 323 329 325 379 373   Cardiac Enzymes: Recent Labs  Lab 10/06/23 1455  CKTOTAL 105   BNP (last 3 results) No results for input(s): "PROBNP" in the last 8760 hours. CBG: Recent Labs  Lab 10/06/23 1601 10/06/23 2022 10/06/23 2247 10/07/23 0313 10/07/23 0750  GLUCAP 183* 134* 127* 178* 195*   D-Dimer: No results for input(s): "DDIMER" in the last 72 hours. Hgb A1c: No results for input(s): "HGBA1C" in the last 72 hours. Lipid Profile: No results for input(s): "CHOL", "HDL", "LDLCALC", "TRIG", "CHOLHDL", "LDLDIRECT" in the last 72 hours. Thyroid function studies: No results for input(s): "TSH", "T4TOTAL", "T3FREE", "THYROIDAB" in the last 72 hours.  Invalid input(s): "FREET3" Anemia work up: No results for input(s): "VITAMINB12", "  FOLATE", "FERRITIN", "TIBC", "IRON", "RETICCTPCT" in the last 72 hours. Sepsis Labs: Recent Labs  Lab 10/02/23 1050 10/02/23 1416 10/02/23 1831 10/03/23 0854 10/04/23 0628 10/05/23 0819 10/06/23 0312  WBC 9.8  --    < > 6.8 7.0 12.4* 16.5*  LATICACIDVEN 2.1* 1.3  --   --   --   --   --    < > = values in this interval not displayed.   Microbiology Recent Results (from the past 240 hours)  MRSA Next Gen by PCR, Nasal     Status: None   Collection Time: 10/02/23  6:09 PM   Specimen: Nasal Mucosa; Nasal Swab  Result Value Ref Range Status   MRSA by PCR Next Gen NOT DETECTED NOT DETECTED Final    Comment: (NOTE) The GeneXpert MRSA Assay (FDA approved for NASAL specimens only), is one component of a comprehensive MRSA colonization surveillance program. It is not intended to diagnose MRSA infection nor to guide or monitor treatment for MRSA infections. Test performance is not FDA approved in patients less than 24 years old. Performed  at [facility], [address], [place], [place] [address]      Medications:    Chlorhexidine Gluconate Cloth  6 each Topical Daily   DULoxetine  30 mg Oral BID   feeding supplement  237 mL Oral BID BM   feeding supplement (PROSource TF20)  60 mL Per Tube BID   folic acid  1 mg Per Tube Daily   heparin  5,000 Units Subcutaneous Q8H   insulin aspart  0-9 Units Subcutaneous Q4H   lactulose  10 g Oral Daily   leptospermum manuka honey  1 Application Topical Daily   lidocaine  1 Application Urethral Once   multivitamin with minerals  1 tablet Per Tube Daily   nutrition supplement (JUVEN)  1 packet Per Tube BID BM   mouth rinse  15 mL Mouth Rinse 4 times per day   phenobarbital  32.4 mg Oral Q8H   thiamine  100 mg Per Tube Daily   Continuous Infusions:  sodium chloride 75 mL/hr at 10/06/23 1710   albumin human 25 g (10/07/23 0140)   ampicillin-sulbactam (UNASYN) IV 1.5 g (10/07/23 0518)   feeding supplement (OSMOLITE 1.5 CAL) 60 mL/hr at 10/06/23 1710   vancomycin 1,250 mg (10/06/23 2104)      LOS: 5 days   Marinda Elk  [facility]  10/07/2023, 8:18 AM

## 2023-10-08 DIAGNOSIS — E876 Hypokalemia: Secondary | ICD-10-CM | POA: Diagnosis not present

## 2023-10-08 DIAGNOSIS — D539 Nutritional anemia, unspecified: Secondary | ICD-10-CM

## 2023-10-08 DIAGNOSIS — E8729 Other acidosis: Secondary | ICD-10-CM

## 2023-10-08 DIAGNOSIS — F10931 Alcohol use, unspecified with withdrawal delirium: Secondary | ICD-10-CM | POA: Diagnosis not present

## 2023-10-08 DIAGNOSIS — N179 Acute kidney failure, unspecified: Secondary | ICD-10-CM | POA: Diagnosis not present

## 2023-10-08 DIAGNOSIS — E871 Hypo-osmolality and hyponatremia: Secondary | ICD-10-CM | POA: Diagnosis not present

## 2023-10-08 LAB — CBC
HCT: 28.8 % — ABNORMAL LOW (ref 39.0–52.0)
Hemoglobin: 9 g/dL — ABNORMAL LOW (ref 13.0–17.0)
MCH: 32.5 pg (ref 26.0–34.0)
MCHC: 31.3 g/dL (ref 30.0–36.0)
MCV: 104 fL — ABNORMAL HIGH (ref 80.0–100.0)
Platelets: 310 10*3/uL (ref 150–400)
RBC: 2.77 MIL/uL — ABNORMAL LOW (ref 4.22–5.81)
RDW: 18 % — ABNORMAL HIGH (ref 11.5–15.5)
WBC: 9.1 10*3/uL (ref 4.0–10.5)
nRBC: 2.5 % — ABNORMAL HIGH (ref 0.0–0.2)

## 2023-10-08 LAB — BASIC METABOLIC PANEL
Anion gap: 12 (ref 5–15)
BUN: 12 mg/dL (ref 6–20)
CO2: 24 mmol/L (ref 22–32)
Calcium: 7.7 mg/dL — ABNORMAL LOW (ref 8.9–10.3)
Chloride: 100 mmol/L (ref 98–111)
Creatinine, Ser: 0.59 mg/dL — ABNORMAL LOW (ref 0.61–1.24)
GFR, Estimated: >60 mL/min (ref >60)
Glucose, Bld: 162 mg/dL — ABNORMAL HIGH (ref 70–99)
Potassium: 3.5 mmol/L (ref 3.5–5.1)
Sodium: 136 mmol/L (ref 135–145)

## 2023-10-08 LAB — GLUCOSE, CAPILLARY
Glucose-Capillary: 154 mg/dL — ABNORMAL HIGH (ref 70–99)
Glucose-Capillary: 165 mg/dL — ABNORMAL HIGH (ref 70–99)
Glucose-Capillary: 152 mg/dL — ABNORMAL HIGH (ref 70–99)
Glucose-Capillary: 129 mg/dL — ABNORMAL HIGH (ref 70–99)
Glucose-Capillary: 148 mg/dL — ABNORMAL HIGH (ref 70–99)
Glucose-Capillary: 160 mg/dL — ABNORMAL HIGH (ref 70–99)

## 2023-10-08 LAB — PHOSPHORUS: Phosphorus: 3.3 mg/dL (ref 2.5–4.6)

## 2023-10-08 LAB — MAGNESIUM: Magnesium: 1.6 mg/dL — ABNORMAL LOW (ref 1.7–2.4)

## 2023-10-08 MED ORDER — POTASSIUM CHLORIDE 10 MEQ/100ML IV SOLN
10.0000 meq | INTRAVENOUS | Status: AC
  Administered 2023-10-08 (×5): 10 meq via INTRAVENOUS
  Filled 2023-10-08 (×4): qty 100

## 2023-10-08 MED ORDER — FREE WATER
200.0000 mL | Freq: Four times a day (QID) | Status: DC
  Administered 2023-10-08 – 2023-10-09 (×5): 200 mL

## 2023-10-08 MED ORDER — MAGNESIUM SULFATE 4 GM/100ML IV SOLN
4.0000 g | Freq: Once | INTRAVENOUS | Status: AC
  Administered 2023-10-08: 4 g via INTRAVENOUS
  Filled 2023-10-08: qty 100

## 2023-10-08 MED ORDER — THIAMINE MONONITRATE 100 MG PO TABS
500.0000 mg | ORAL_TABLET | Freq: Every day | ORAL | Status: DC
  Administered 2023-10-08 – 2023-10-09 (×2): 500 mg via ORAL
  Filled 2023-10-08 (×2): qty 5

## 2023-10-08 MED ORDER — ADULT MULTIVITAMIN W/MINERALS CH
1.0000 | ORAL_TABLET | Freq: Every day | ORAL | Status: DC
  Administered 2023-10-08 – 2023-10-09 (×2): 1 via ORAL
  Filled 2023-10-08 (×2): qty 1

## 2023-10-08 MED ORDER — JUVEN PO PACK
1.0000 | PACK | Freq: Two times a day (BID) | ORAL | Status: DC
  Administered 2023-10-08 – 2023-10-15 (×16): 1 via ORAL
  Filled 2023-10-08 (×18): qty 1

## 2023-10-08 MED ORDER — FOLIC ACID 1 MG PO TABS
1.0000 mg | ORAL_TABLET | Freq: Every day | ORAL | Status: DC
  Administered 2023-10-08 – 2023-10-09 (×2): 1 mg via ORAL
  Filled 2023-10-08 (×2): qty 1

## 2023-10-08 NOTE — Progress Notes (Signed)
 [facility] PROGRESS NOTE    Progress Note  [name]  ZOX:[id number] DOB: November 05, 1975 DOA: 10/02/2023 PCP: Benita Stabile, MD     Brief Narrative:   Connor Copeland is an 48 y.o. male past medical history of chronic inflammatory demyelinating polyneuropathy, alcohol disorder seizure presents to [facility] with altered mental status, last drink 3 to 4 days prior to admission.  He did became aggressive and falling in the ED was found to have been severe alcohol withdrawal mated under PCCM started on Precedex   Assessment/Plan:   Alcohol withdrawal seizure (HCC): Last drink was 4 days prior to admission. Has completed his phenobarbital taper. Continue Ativan. More awake today, relate he is thirsty alert to person able to follow some simple commands. Continue thiamine and folate. PT OT evaluated the patient, will need skilled nursing facility. Continue seizure precautions. Currently n.p.o. for risk of aspiration. Will reevaluate with speech in 24 to 48 hours. Currently on tube feedings.  Hydro electrolytic imbalance/Hyponatremia/hypokalemia/hypocalcemia/hypomagnesemia: Suspect likely hypovolemic.  Sodium is improved. Potassium is borderline.  Magnesium is 1.6 we will go ahead and replete Try to keep magnesium greater than 2, phosphorus greater than 3, and potassium greater than 4. With good urine output urine has cleared  New onset of fevers likely due to aspiration pneumonia: Continue empirically on IV Unasyn and vancomycin. Has defervesced leukocytosis improved. Once able to take oral can transition to oral Augmentin.  High anion gap metabolic acidosis/lactic acidosis: Likely due to seizure plus or minus starvation ketosis activity now clear with conservative management.  Transaminitis: They are trending down CK is 105.  Acute kidney injury: Hemodynamically mediated. Resolved with IV fluid resuscitation.  Left upper extremity wounds present on  admission: Continue IV vancomycin, once able to take orals can transition to oral doxycycline  Incidental bilateral hygromas: Continue to monitor.  Diabetes mellitus type 2/hypoglycemia: At home he was using metformin and linagliptin. All Oral hypoglycemic agents were held, continue sliding scale insulin post low-dose long-acting. Currently on on tube feedings.  Severe protein caloric malnutrition/nutrition: Currently with a track and tube feedings.  CIDP: Continue Cymbalta.    DVT prophylaxis: lovenox Family Communication:none Status is: Inpatient Remains inpatient appropriate because: Acute alcohol withdrawal    Code Status:     Code Status Orders  (From admission, onward)           Start     Ordered   10/02/23 1701  Full code  Continuous       Question:  By:  Answer:  Default: patient does not have capacity for decision making, no surrogate or prior directive available   10/02/23 1704           Code Status History     Date Active Date Inactive Code Status Order ID Comments User Context   08/19/2023 1701 08/26/2023 1602 Full Code [id number]  Lynnae Prude Inpatient   08/19/2023 1701 08/19/2023 1701 Full Code [id number]  Lynnae Prude Inpatient   08/08/2023 1441 08/19/2023 1655 Full Code [id number]  Kendell Bane, MD ED   01/28/2023 1158 02/03/2023 1751 Full Code [id number]  Cleora Fleet, MD ED         IV Access:   Peripheral IV   Procedures and diagnostic studies:   No results found.    Medical Consultants:   None.   Subjective:    Connor Copeland relates he is at home this morning.  Objective:    Vitals:   10/08/23  0200 10/08/23 0317 10/08/23 0456 10/08/23 0600  BP: 112/68   106/76  Pulse:      Resp:      Temp:  97.8 F (36.6 C)    TempSrc:  Oral    SpO2:      Weight:   111.4 kg   Height:       SpO2: 98 % O2 Flow Rate (L/min): 2 L/min   Intake/Output Summary (Last 24 hours) at 10/08/2023  0754 Last data filed at 10/08/2023 0559 Gross per 24 hour  Intake 5147.25 ml  Output 1075 ml  Net 4072.25 ml   Filed Weights   10/06/23 0500 10/07/23 0300 10/08/23 0456  Weight: 105.2 kg (P) 111.4 kg 111.4 kg    Exam: General exam: In no acute distress. Respiratory system: Good air movement and clear to auscultation. Cardiovascular system: S1 & S2 heard, RRR. No JVD. Gastrointestinal system: Abdomen is nondistended, soft and nontender.  Central nervous system: Alert and oriented only to person Extremities: No pedal edema. Skin: No rashes, lesions or ulcers Psychiatry: No judgement and insight appear normal.   Data Reviewed:    Labs: Basic Metabolic Panel: Recent Labs  Lab 10/04/23 2019 10/05/23 0819 10/06/23 0312 10/07/23 0731 10/07/23 0734 10/08/23 0535  NA 133* 133* 134* 136  --  136  K 3.9 3.5 3.8 3.3*  --  3.5  CL 93* 92* 95* 96*  --  100  CO2 27 24 28 27   --  24  GLUCOSE 163* 180* 139* 169*  --  162*  BUN 10 10 12 12   --  12  CREATININE 0.79 0.82 0.83 0.61  --  0.59*  CALCIUM 6.8* 7.3* 7.4* 7.5*  --  7.7*  MG 1.9 1.4* 1.7  --  1.6* 1.6*  PHOS 2.3* 2.1* 2.4*  --  2.3* 3.3   GFR Estimated Creatinine Clearance: 135.2 mL/min (A) (by C-G formula based on SCr of 0.59 mg/dL (L)). Liver Function Tests: Recent Labs  Lab 10/02/23 1050 10/04/23 0628 10/05/23 0819 10/06/23 0312  AST 230* 134* 123* 100*  ALT 168* 111* 124* 111*  ALKPHOS 477* 374* 438* 348*  BILITOT 3.9* 3.3* 3.9* 3.8*  PROT 5.9* 4.5* 5.2* 4.8*  ALBUMIN 2.1* <1.5* 1.6* <1.5*   No results for input(s): "LIPASE", "AMYLASE" in the last 168 hours. Recent Labs  Lab 10/02/23 1050  AMMONIA 43*   Coagulation profile Recent Labs  Lab 10/02/23 1902  INR 1.2   COVID-19 Labs  No results for input(s): "DDIMER", "FERRITIN", "LDH", "CRP" in the last 72 hours.  No results found for: "SARSCOV2NAA"  CBC: Recent Labs  Lab 10/03/23 0854 10/04/23 0628 10/05/23 0819 10/06/23 0312 10/07/23 0731   WBC 6.8 7.0 12.4* 16.5* 14.9*  NEUTROABS  --   --   --   --  7.4  HGB 10.1* 9.3* 10.7* 9.4* 9.0*  HCT 29.5* 27.8* 33.0* 29.6* 29.2*  MCV 92.8 94.2 97.3 100.0 102.1*  PLT 329 325 379 373 374   Cardiac Enzymes: Recent Labs  Lab 10/06/23 1455  CKTOTAL 105   BNP (last 3 results) No results for input(s): "PROBNP" in the last 8760 hours. CBG: Recent Labs  Lab 10/07/23 1132 10/07/23 1521 10/07/23 1939 10/07/23 2251 10/08/23 0316  GLUCAP 176* 169* 182* 114* 154*   D-Dimer: No results for input(s): "DDIMER" in the last 72 hours. Hgb A1c: No results for input(s): "HGBA1C" in the last 72 hours. Lipid Profile: No results for input(s): "CHOL", "HDL", "LDLCALC", "TRIG", "CHOLHDL", "LDLDIRECT" in the  last 72 hours. Thyroid function studies: No results for input(s): "TSH", "T4TOTAL", "T3FREE", "THYROIDAB" in the last 72 hours.  Invalid input(s): "FREET3" Anemia work up: No results for input(s): "VITAMINB12", "FOLATE", "FERRITIN", "TIBC", "IRON", "RETICCTPCT" in the last 72 hours. Sepsis Labs: Recent Labs  Lab 10/02/23 1050 10/02/23 1416 10/02/23 1831 10/04/23 0628 10/05/23 0819 10/06/23 0312 10/07/23 0731  WBC 9.8  --    < > 7.0 12.4* 16.5* 14.9*  LATICACIDVEN 2.1* 1.3  --   --   --   --   --    < > = values in this interval not displayed.   Microbiology Recent Results (from the past 240 hours)  MRSA Next Gen by PCR, Nasal     Status: None   Collection Time: 10/02/23  6:09 PM   Specimen: Nasal Mucosa; Nasal Swab  Result Value Ref Range Status   MRSA by PCR Next Gen NOT DETECTED NOT DETECTED Final    Comment: (NOTE) The GeneXpert MRSA Assay (FDA approved for NASAL specimens only), is one component of a comprehensive MRSA colonization surveillance program. It is not intended to diagnose MRSA infection nor to guide or monitor treatment for MRSA infections. Test performance is not FDA approved in patients less than 53 years old. Performed at [facility], [address], [place], [place] [address]      Medications:    Chlorhexidine Gluconate Cloth  6 each Topical Daily   DULoxetine  30 mg Oral BID   feeding supplement  237 mL Oral BID BM   feeding supplement (PROSource TF20)  60 mL Per Tube BID   folic acid  1 mg Per Tube Daily   heparin  5,000 Units Subcutaneous Q8H   insulin aspart  0-9 Units Subcutaneous Q4H   insulin glargine  5 Units Subcutaneous Daily   lactulose  10 g Oral Daily   leptospermum manuka honey  1 Application Topical Daily   lidocaine  1 Application Urethral Once   multivitamin with minerals  1 tablet Per Tube Daily   nutrition supplement (JUVEN)  1 packet Per Tube BID BM   mouth rinse  15 mL Mouth Rinse 4 times per day   phenobarbital  32.4 mg Oral Q8H   thiamine  500 mg Per Tube Daily   Continuous Infusions:  ampicillin-sulbactam (UNASYN) IV 1.5 g (10/08/23 0542)   feeding supplement (OSMOLITE 1.5 CAL) 60 mL/hr at 10/07/23 1948   vancomycin 1,250 mg (10/07/23 2027)      LOS: 6 days   Marinda Elk  [facility]  10/08/2023, 7:54 AM

## 2023-10-08 NOTE — Plan of Care (Signed)
  Problem: Education: Goal: Ability to describe self-care measures that may prevent or decrease complications (Diabetes Survival Skills Education) will improve Outcome: Progressing   Problem: Education: Goal: Individualized Educational Video(s) Outcome: Progressing   Problem: Coping: Goal: Ability to adjust to condition or change in health will improve Outcome: Progressing   Problem: Health Behavior/Discharge Planning: Goal: Ability to identify and utilize available resources and services will improve Outcome: Progressing   Problem: Metabolic: Goal: Ability to maintain appropriate glucose levels will improve Outcome: Progressing   Problem: Nutritional: Goal: Maintenance of adequate nutrition will improve Outcome: Progressing   Problem: Skin Integrity: Goal: Risk for impaired skin integrity will decrease Outcome: Progressing   Problem: Tissue Perfusion: Goal: Adequacy of tissue perfusion will improve Outcome: Progressing   Problem: Education: Goal: Knowledge of General Education information will improve Description: Including pain rating scale, medication(s)/side effects and non-pharmacologic comfort measures Outcome: Progressing   Problem: Clinical Measurements: Goal: Ability to maintain clinical measurements within normal limits will improve Outcome: Progressing   Problem: Activity: Goal: Risk for activity intolerance will decrease Outcome: Progressing   Problem: Nutrition: Goal: Adequate nutrition will be maintained Outcome: Progressing   Problem: Coping: Goal: Level of anxiety will decrease Outcome: Progressing   Problem: Safety: Goal: Ability to remain free from injury will improve Outcome: Progressing   Problem: Skin Integrity: Goal: Risk for impaired skin integrity will decrease Outcome: Progressing   Problem: Pain Managment: Goal: General experience of comfort will improve and/or be controlled Outcome: Progressing

## 2023-10-09 DIAGNOSIS — R569 Unspecified convulsions: Secondary | ICD-10-CM | POA: Diagnosis not present

## 2023-10-09 DIAGNOSIS — F10931 Alcohol use, unspecified with withdrawal delirium: Secondary | ICD-10-CM | POA: Diagnosis not present

## 2023-10-09 LAB — BASIC METABOLIC PANEL
Anion gap: 6 (ref 5–15)
BUN: 14 mg/dL (ref 6–20)
CO2: 25 mmol/L (ref 22–32)
Calcium: 7.4 mg/dL — ABNORMAL LOW (ref 8.9–10.3)
Chloride: 104 mmol/L (ref 98–111)
Creatinine, Ser: 0.55 mg/dL — ABNORMAL LOW (ref 0.61–1.24)
GFR, Estimated: >60 mL/min (ref >60)
Glucose, Bld: 114 mg/dL — ABNORMAL HIGH (ref 70–99)
Potassium: 3.5 mmol/L (ref 3.5–5.1)
Sodium: 135 mmol/L (ref 135–145)

## 2023-10-09 LAB — PHOSPHORUS: Phosphorus: 2.7 mg/dL (ref 2.5–4.6)

## 2023-10-09 LAB — MAGNESIUM: Magnesium: 1.6 mg/dL — ABNORMAL LOW (ref 1.7–2.4)

## 2023-10-09 LAB — GLUCOSE, CAPILLARY
Glucose-Capillary: 126 mg/dL — ABNORMAL HIGH (ref 70–99)
Glucose-Capillary: 123 mg/dL — ABNORMAL HIGH (ref 70–99)
Glucose-Capillary: 132 mg/dL — ABNORMAL HIGH (ref 70–99)
Glucose-Capillary: 108 mg/dL — ABNORMAL HIGH (ref 70–99)
Glucose-Capillary: 102 mg/dL — ABNORMAL HIGH (ref 70–99)

## 2023-10-09 MED ORDER — QUETIAPINE FUMARATE 50 MG PO TABS: 25.0000 mg | ORAL_TABLET | Freq: Every day | ORAL | Status: DC

## 2023-10-09 MED ORDER — AMPICILLIN-SULBACTAM SODIUM 1.5 (1-0.5) G IJ SOLR
1.5000 g | Freq: Four times a day (QID) | INTRAMUSCULAR | Status: DC
  Administered 2023-10-09 – 2023-10-10 (×3): 1.5 g via INTRAVENOUS
  Filled 2023-10-09 (×4): qty 4

## 2023-10-09 MED ORDER — MAGNESIUM SULFATE 4 GM/100ML IV SOLN
4.0000 g | Freq: Once | INTRAVENOUS | Status: AC
  Administered 2023-10-09: 4 g via INTRAVENOUS
  Filled 2023-10-09: qty 100

## 2023-10-09 MED ORDER — ACETAMINOPHEN 325 MG PO TABS
650.0000 mg | ORAL_TABLET | Freq: Four times a day (QID) | ORAL | Status: DC | PRN
  Administered 2023-10-09 – 2023-10-17 (×6): 650 mg via ORAL
  Filled 2023-10-09 (×6): qty 2

## 2023-10-09 MED ORDER — DOXYCYCLINE HYCLATE 100 MG PO TABS
100.0000 mg | ORAL_TABLET | Freq: Two times a day (BID) | ORAL | Status: DC
  Filled 2023-10-09: qty 1

## 2023-10-09 MED ORDER — AMOXICILLIN-POT CLAVULANATE 400-57 MG/5ML PO SUSR
800.0000 mg | Freq: Two times a day (BID) | ORAL | Status: DC
  Filled 2023-10-09: qty 10

## 2023-10-09 MED ORDER — HALOPERIDOL LACTATE 5 MG/ML IJ SOLN
1.0000 mg | Freq: Four times a day (QID) | INTRAMUSCULAR | Status: DC | PRN
  Administered 2023-10-09: 1 mg via INTRAVENOUS
  Filled 2023-10-09: qty 1

## 2023-10-09 MED ORDER — ENOXAPARIN SODIUM 40 MG/0.4ML IJ SOSY
40.0000 mg | PREFILLED_SYRINGE | INTRAMUSCULAR | Status: DC
  Administered 2023-10-10 – 2023-10-17 (×8): 40 mg via SUBCUTANEOUS
  Filled 2023-10-09 (×8): qty 0.4

## 2023-10-09 MED ORDER — POTASSIUM CHLORIDE 20 MEQ PO PACK
40.0000 meq | PACK | Freq: Once | ORAL | Status: AC
  Administered 2023-10-09: 40 meq
  Filled 2023-10-09: qty 2

## 2023-10-09 MED ORDER — DULOXETINE HCL 30 MG PO CPEP
30.0000 mg | ORAL_CAPSULE | Freq: Every day | ORAL | Status: DC
  Administered 2023-10-10: 30 mg via ORAL
  Filled 2023-10-09: qty 1

## 2023-10-09 MED ORDER — DOXYCYCLINE MONOHYDRATE 25 MG/5ML PO SUSR
100.0000 mg | Freq: Two times a day (BID) | ORAL | Status: DC
Start: 2023-10-09 — End: 2023-10-09

## 2023-10-09 MED ORDER — SODIUM CHLORIDE 0.9 % IV SOLN
500.0000 mg | Freq: Every day | INTRAVENOUS | Status: AC
  Administered 2023-10-09 – 2023-10-11 (×3): 500 mg via INTRAVENOUS
  Filled 2023-10-09 (×3): qty 5

## 2023-10-09 NOTE — Progress Notes (Signed)
 Occupational Therapy Treatment Patient Details Name: Connor Copeland MRN: [id number] DOB: 1975-10-31 Today's Date: 10/09/2023   History of present illness Pt is a 48 year old male admitted 2/27 for ETOH withdrawal and seizures. PMH: chronic inflammatory demyelinating polyneuropathy, alcohol use disorder, seizures, hyponatremia (Simultaneous filing. User may not have seen previous data.)   OT comments  Patient received in supine and agreeable to OT/PT session. Patient demonstrating good gains with bed mobility, sitting balance, and sit to stands from EOB. Patient believed himself to be in [place] and was oriented that he was in [place] at [facility]. Patient able to recall when asked again that the was in [place] but unable to recall he was in hospital. Patient will benefit from continued inpatient follow up therapy, <3 hours/day. Acute OT to continue to follow to address established goals to facilitate DC to next venue of care.       If plan is discharge home, recommend the following:  Two people to help with bathing/dressing/bathroom   Equipment Recommendations  Wheelchair (measurements OT);Wheelchair cushion (measurements OT);Hospital bed;Hoyer lift    Recommendations for Other Services      Precautions / Restrictions Precautions Precautions: Fall (Simultaneous filing. User may not have seen previous data.) Precaution/Restrictions Comments: bil mittens, foley, cortrak (Simultaneous filing. User may not have seen previous data.) Restrictions Weight Bearing Restrictions Per Provider Order: No (Simultaneous filing. User may not have seen previous data.)       Mobility Bed Mobility Overal bed mobility: Needs Assistance (Simultaneous filing. User may not have seen previous data.) Bed Mobility: Supine to Sit, Sit to Supine (Simultaneous filing. User may not have seen previous data.)     Supine to sit: Mod assist (Simultaneous filing. User may not have seen  previous data.) Sit to supine: Mod assist, +2 for physical assistance (Simultaneous filing. User may not have seen previous data.)   General bed mobility comments: verbal cues throught out with assistance to raise trunk and with BLEs. Patient fatigued at end of session, requiring increased assistance to return to supine (Simultaneous filing. User may not have seen previous data.)    Transfers Overall transfer level: Needs assistance (Simultaneous filing. User may not have seen previous data.) Equipment used: 2 person hand held assist (Simultaneous filing. User may not have seen previous data.) Transfers: Sit to/from Stand (Simultaneous filing. User may not have seen previous data.)                   Balance                                           ADL either performed or assessed with clinical judgement   ADL Overall ADL's : Needs assistance/impaired                 Upper Body Dressing : Maximal assistance;Sitting Upper Body Dressing Details (indicate cue type and reason): to donn gown                   General ADL Comments: focused on bed mobility, sitting balance, and sit to stands    Extremity/Trunk Assessment Upper Extremity Assessment Upper Extremity Assessment: Generalized weakness            Vision       Perception     Praxis     Communication Communication Communication: Impaired (Simultaneous filing. User may not have  seen previous data.) Factors Affecting Communication: Reduced clarity of speech (Simultaneous filing. User may not have seen previous data.)   Cognition Arousal: Lethargic (Simultaneous filing. User may not have seen previous data.) Behavior During Therapy: Flat affect, Restless (Simultaneous filing. User may not have seen previous data.) Cognition: Cognition impaired   Orientation impairments: Place, Time, Situation   Memory impairment (select all impairments): Working memory Attention impairment  (select first level of impairment): Focused attention Executive functioning impairment (select all impairments): Problem solving OT - Cognition Comments: Patient believed that he was at home in [place]. Patient informed that he was in [place] in a hospital, [place]. Patient able to recall he was in [place] after being asked later in session x2 but stated he was in a school or church                 Following commands: Impaired (Simultaneous filing. User may not have seen previous data.) Following commands impaired: Follows one step commands inconsistently (Simultaneous filing. User may not have seen previous data.)      Cueing      Exercises      Shoulder Instructions       General Comments      Pertinent Vitals/ Pain       Pain Assessment Pain Assessment: Faces (Simultaneous filing. User may not have seen previous data.) Faces Pain Scale: Hurts little more (Simultaneous filing. User may not have seen previous data.) Pain Location: All Over Pain Descriptors / Indicators: Grimacing, Moaning (Simultaneous filing. User may not have seen previous data.) Pain Intervention(s): Limited activity within patient's tolerance, Monitored during session, Repositioned, Patient requesting pain meds-RN notified (Simultaneous filing. User may not have seen previous data.)  Home Living                                          Prior Functioning/Environment              Frequency  Min 2X/week        Progress Toward Goals  OT Goals(current goals can now be found in the care plan section)  Progress towards OT goals: Progressing toward goals  Acute Rehab OT Goals Patient Stated Goal: none stated OT Goal Formulation: Patient unable to participate in goal setting Time For Goal Achievement: 10/21/23 Potential to Achieve Goals: Good ADL Goals Pt Will Perform Eating: with supervision;sitting;bed level Pt Will Perform Grooming: with set-up;sitting;bed  level Pt Will Perform Upper Body Dressing: with min assist;sitting Pt Will Transfer to Toilet: with +2 assist;with mod assist;stand pivot transfer;bedside commode Pt/caregiver will Perform Home Exercise Program: Increased ROM;Increased strength;Both right and left upper extremity;With minimal assist;With written HEP provided Additional ADL Goal #1: pt will complete bed mobility total +2 mod A  Plan      Co-evaluation    PT/OT/SLP Co-Evaluation/Treatment: Yes   PT goals addressed during session: Mobility/safety with mobility OT goals addressed during session: ADL's and self-care;Proper use of Adaptive equipment and DME;Strengthening/ROM      AM-PAC OT "6 Clicks" Daily Activity     Outcome Measure   Help from another person eating meals?: Total Help from another person taking care of personal grooming?: A Lot Help from another person toileting, which includes using toliet, bedpan, or urinal?: Total Help from another person bathing (including washing, rinsing, drying)?: Total Help from another person to put on and taking off regular upper body clothing?:  Total Help from another person to put on and taking off regular lower body clothing?: Total 6 Click Score: 7    End of Session Equipment Utilized During Treatment: Gait belt  OT Visit Diagnosis: Unsteadiness on feet (R26.81);Muscle weakness (generalized) (M62.81)   Activity Tolerance Patient limited by fatigue;Patient limited by pain   Patient Left in bed;with call bell/phone within reach;with bed alarm set   Nurse Communication Mobility status;Precautions;Patient requests pain meds        Time: 1221-1246 OT Time Calculation (min): 25 min  Charges: OT General Charges $OT Visit: 1 Visit OT Treatments $Therapeutic Activity: 8-22 mins  Alfonse Flavors, OTA Acute Rehabilitation Services  Office [phone]   Dewain Penning 10/09/2023, 2:45 PM

## 2023-10-09 NOTE — Progress Notes (Signed)
 Physical Therapy Treatment Patient Details Name: SHELLY SHOULTZ MRN: [id number] DOB: 1976-05-16 Today's Date: 10/09/2023   History of Present Illness Pt is a 48 year old male admitted 2/27 for ETOH withdrawal and seizures. PMH: chronic inflammatory demyelinating polyneuropathy, alcohol use disorder, seizures, hyponatremia    PT Comments  Session focused on theract to promote functional mobility. Pt displayed improvements in his bed mobility, sitting/standing balance, activity tolerance by increasing sitting EOB time and performing multiple sit to stands, cognition, and transfers today by completing them with less assistance and cueing required, and participated longer into the session. Pt continues to display difficulty with activity tolerance, pain, cognition, LE strength, and functional mobility and would benefit from continued acute PT before d/c. Pt orientation continues to be impaired as he reports being at school or church when asked for his current location. Will continue to follow acutely   If plan is discharge home, recommend the following: Assistance with cooking/housework;Assistance with feeding;Direct supervision/assist for medications management;Direct supervision/assist for financial management;Assist for transportation;Help with stairs or ramp for entrance;Two people to help with walking and/or transfers;Two people to help with bathing/dressing/bathroom   Can travel by private vehicle     No  Equipment Recommendations       Recommendations for Other Services       Precautions / Restrictions Precautions Precautions: Fall Recall of Precautions/Restrictions: Intact Precaution/Restrictions Comments: bil mittens, foley, cortrak Restrictions Weight Bearing Restrictions Per Provider Order: No     Mobility  Bed Mobility Overal bed mobility: Needs Assistance Bed Mobility: Supine to Sit, Sit to Supine     Supine to sit: Mod assist Sit to supine: Mod assist, +2 for  physical assistance   General bed mobility comments: verbal cues throught out with assistance to raise trunk and with BLEs. Patient fatigued at end of session, requiring increased assistance to return to supine    Transfers Overall transfer level: Needs assistance Equipment used: 2 person hand held assist Transfers: Sit to/from Stand Sit to Stand: Mod assist, +2 physical assistance, From elevated surface, Min assist           General transfer comment: Pt performed two sit to stands both with 2 person HHA, 1st was mod A x2 and required cueing for proper sequencing, powering up from bed, and maintaining upright trunk posture. 2nd sit to stand pt is min A x2 and required less cueing for maintaining upright trunk posture upon standing    Ambulation/Gait               General Gait Details: deferred this session s/t pain   Stairs             Wheelchair Mobility     Tilt Bed    Modified Rankin (Stroke Patients Only)       Balance Overall balance assessment: Needs assistance Sitting-balance support: Bilateral upper extremity supported, Feet supported Sitting balance-Leahy Scale: Poor Sitting balance - Comments: Pt is able to maintain sitting balance without UE momentarily but has he fatigues requires more external support from the therapist or his UEs. Sits with increased posterior lean when fatigued Postural control: Posterior lean Standing balance support: Reliant on assistive device for balance, Bilateral upper extremity supported Standing balance-Leahy Scale: Poor Standing balance comment: Pt reliant on 2 person HHA for standing balance. Showed improvement in maintaining upright trunk posture without cueing with second sit to stand  Communication Communication Communication: Impaired Factors Affecting Communication: Reduced clarity of speech  Cognition Arousal: Lethargic Behavior During Therapy: Flat affect, Restless    PT - Cognitive impairments: No family/caregiver present to determine baseline                       PT - Cognition Comments: Pt displayed increased levels of arousal during today's session and maintained his eyes open throughout the majority of the session. Following commands: Impaired Following commands impaired: Follows one step commands inconsistently, Follows one step commands with increased time    Cueing Cueing Techniques: Verbal cues, Tactile cues  Exercises      General Comments        Pertinent Vitals/Pain Pain Assessment Pain Assessment: Faces Faces Pain Scale: Hurts even more Pain Location: pt reports his back, knee, and leg are painful. Pain Descriptors / Indicators: Grimacing, Moaning Pain Intervention(s): Limited activity within patient's tolerance, Monitored during session    Home Living                          Prior Function            PT Goals (current goals can now be found in the care plan section) Acute Rehab PT Goals Patient Stated Goal: get better PT Goal Formulation: Patient unable to participate in goal setting Time For Goal Achievement: 10/18/23 Potential to Achieve Goals: Fair Progress towards PT goals: Progressing toward goals    Frequency    Min 3X/week      PT Plan      Co-evaluation PT/OT/SLP Co-Evaluation/Treatment: Yes   PT goals addressed during session: Mobility/safety with mobility OT goals addressed during session: ADL's and self-care;Proper use of Adaptive equipment and DME;Strengthening/ROM      AM-PAC PT "6 Clicks" Mobility   Outcome Measure  Help needed turning from your back to your side while in a flat bed without using bedrails?: A Lot Help needed moving from lying on your back to sitting on the side of a flat bed without using bedrails?: A Lot Help needed moving to and from a bed to a chair (including a wheelchair)?: Total Help needed standing up from a chair using your arms (e.g.,  wheelchair or bedside chair)?: Total Help needed to walk in hospital room?: Total Help needed climbing 3-5 steps with a railing? : Total 6 Click Score: 8    End of Session Equipment Utilized During Treatment: Gait belt;Oxygen Activity Tolerance: Patient limited by fatigue;Patient limited by pain Patient left: in bed;with call bell/phone within reach;with bed alarm set Nurse Communication: Mobility status PT Visit Diagnosis: Unsteadiness on feet (R26.81);Other abnormalities of gait and mobility (R26.89);Muscle weakness (generalized) (M62.81);Difficulty in walking, not elsewhere classified (R26.2);Pain Pain - part of body: Leg;Knee (& back,)     Time: 1610-9604 PT Time Calculation (min) (ACUTE ONLY): 25 min  Charges:    $Therapeutic Activity: 8-22 mins PT General Charges $$ ACUTE PT VISIT: 1 Visit                     [address], SPT    Mit[place] 10/09/2023, 3:14 PM

## 2023-10-09 NOTE — Progress Notes (Signed)
 [facility] Progress Note Patient: Connor Copeland [id number]:[id number] DOB: November 19, 1975 DOA: 10/02/2023  DOS: the patient was seen and examined on 10/09/2023  Brief Hospital Course: PMH of CIDP, alcohol abuse, HTN, type II DM, mood disorder, presented to the hospital with complaints of seizures and any pain. Was transferred to [facility] under ICU care. Also found to have multiple wounds, orthopedic was consulted for the same.  Had a Cortrak for nutrition as was not safe to swallow. Assessment and Plan: Alcohol withdrawal seizure Last drink was 4 days prior to admission. Has completed his phenobarbital taper. Continue Ativan as needed. Continue thiamine and folate. PT OT evaluated the patient, will need skilled nursing facility. Continue seizure precautions. Currently n.p.o. for risk of aspiration. Incidentally pulled out his Cortrak.  Will have speech therapy to evaluate.   Aspiration pneumonia: Treated with IV Unasyn.  Blood cultures negative.  No respiratory distress right now.  Although with his rotation at risk for further aspiration. Once able to take oral can transition to oral Augmentin.   High anion gap metabolic acidosis/lactic acidosis: Likely due to seizure plus or minus starvation ketosis activity now clear with conservative management.   Transaminitis: Hypoalbuminemia. Hyperbilirubinemia. LFT elevation cause, possible alcohol hepatitis versus hemodynamic injury. Improving.  Will recheck. Hypoalbuminemia likely the cause of his third spacing as well as edema.  May require diuresis.   Acute kidney injury: Baseline creatinine 0.7.  On admission creatinine 1.26.  Treated with IV fluid. Hemodynamically mediated.  Hypokalemia. Hyponatremia. Hypocalcemia. Monitor. Replace as needed.   Left upper extremity wounds present on admission: Wound care was consulted. Orthopedic was also consulted.  Orthopedic recommended reconsultation once he is less agitated after an MRI  with and without contrast. For now we will continue with IV antibiotics. Was empirically on IV vancomycin.  MRSA PCR is negative.  Blood cultures are negative.  Plan was to switch to doxycycline although currently no indication to cover for atypical organisms or MRSA therefore we will continue with IV ampicillin.   Incidental bilateral hygromas: Suspect that he may have a history of recurrent fall in the setting of alcohol abuse. For now monitor.   Type 2 diabetes mellitus, uncontrolled with hyper and hypoglycemia without long-term insulin use without complication. At home he was using metformin and linagliptin. Continue to hold oral hypoglycemic agent. Continue sliding scale insulin.  Every 4 hours   Adult failure to thrive In the setting of alcohol use disorder. Was receiving tube feeds via core track.  Now Cortrak assessment dislodged accidentally by patient.  Will monitor for SLP recommendation before replacement.   CIDP: Continue Cymbalta.  Obesity Class 1 Body mass index is 38.52 kg/m (pended).  Placing the pt at higher risk of poor outcomes.  Subjective: Alert and awake.  Somewhat impulsive.  Denies any acute complaint.  No nausea no vomiting.  Later on pulled out his Cortrak.  Reportedly had some diarrhea.  Physical Exam: General: in Mild distress, No Rash Cardiovascular: S1 and S2 Present, No Murmur Respiratory: Good respiratory effort, Bilateral Air entry present. No Crackles, No wheezes Abdomen: Bowel Sound present, No tenderness Extremities: Generalized edema involving all extremities Neuro: Alert and oriented to self and place, no new focal deficit  Data Reviewed: I have Reviewed nursing notes, Vitals, and Lab results. Since last encounter, pertinent lab results CBC and CMP   . I have ordered test including CBC and CMP  .   Disposition: Status is: Inpatient Remains inpatient appropriate because: Monitor for improvement in mentation  enoxaparin (LOVENOX)  injection 40 mg Start: 10/10/23 1000 SCDs Start: 10/02/23 1700   Family Communication: No one at bedside Level of care: Progressive   Vitals:   10/09/23 0800 10/09/23 0803 10/09/23 1141 10/09/23 1606  BP: (!) 148/127 (!) 126/108 125/63 (!) 118/93  Pulse: 99 99 98 96  Resp:   18 18  Temp:  98.6 F (37 C) 97.6 F (36.4 C) 98.3 F (36.8 C)  TempSrc:  Oral Oral Oral  SpO2: 100% 100% 95% 100%  Weight:      Height:         Author: Lynden Oxford, MD 10/09/2023 5:06 PM  Please look on [email or web address] to find out who is on call.

## 2023-10-09 NOTE — Plan of Care (Signed)

## 2023-10-09 NOTE — Progress Notes (Signed)
 Pt pulled his Cortrak out.... MD notified. MD will change route orders for meds.

## 2023-10-09 NOTE — Plan of Care (Signed)

## 2023-10-09 NOTE — Progress Notes (Signed)
 Nutrition Follow-up  DOCUMENTATION CODES:   Not applicable  INTERVENTION:  Continue tube feeding via Cortrak tube: Osmolite 1.5 at 60 ml/hr (1440 ml per day) Prosource TF20 60 ml BID FWF per MD   Provides 2320 kcal, 130 gm protein, 1094 ml free water daily   Continue folic acid and MVI with minerals per tube Increase Thiamine to 500 mg daily x 5 days then 100 mg Thiamine daily thereafter  Juven BID per tube Recommend discontinuing lactulose if ammonia levels are stable due to multiple loose stools. Last Ammonia checked was 10/02/23: 43 umol/L.  Will need PEG if unable to advance diet if plan is for SNF   NUTRITION DIAGNOSIS:   Increased nutrient needs related to wound healing as evidenced by estimated needs.  - Still applicable   GOAL:   Patient will meet greater than or equal to 90% of their needs  - Meeting via TF  MONITOR:   TF tolerance  REASON FOR ASSESSMENT:   Consult Enteral/tube feeding initiation and management  ASSESSMENT:   Pt with PMH of chronic demyelinating polyneuropathy, ETOH use disorder, seizures, and hyponatremia admitted with ETOH withdrawal and seizures.  2/28 - s/p cortrak tube; tip gastric  3/1 - Dysphagia 3, thin liquids 3/2 - Transferred out of ICU 3/4 - NPO    Patient tolerating TF at goal. Has been having multiple loose BM everyday since lactulose started. 6 BM yesterday. Messaged MD about discontinuing lactulose. Plan for SLP to see later today if time allows. PT,OT recommended SNF at dispo, will need PEG if diet not advanced/poor PO intake.   Admit weight: 105.1 kg  Current weight: 114.9 kg    Nutritionally Relevant Medications: Scheduled Meds:  feeding supplement  237 mL Oral BID BM   feeding supplement (PROSource TF20)  60 mL Per Tube BID   folic acid  1 mg Oral Daily   free water  200 mL Per Tube Q6H   insulin aspart  0-9 Units Subcutaneous Q4H   insulin glargine  5 Units Subcutaneous Daily   lactulose  10 g Oral Daily    multivitamin with minerals  1 tablet Oral Daily   nutrition supplement (JUVEN)  1 packet Oral BID BM   thiamine  500 mg Oral Daily   Continuous Infusions:  ampicillin-sulbactam (UNASYN) IV 1.5 g (10/09/23 0505)   feeding supplement (OSMOLITE 1.5 CAL) 1,000 mL (10/09/23 0503)   magnesium sulfate bolus IVPB 4 g (10/09/23 0849)   vancomycin 1,250 mg (10/09/23 0854)   Labs Reviewed: Creatinine 0.55, Calcium 7.4, Magnesium 1.6, Alkaline phosphatase 348, Albumin <1.5, AST 100, ALT, 111, Ammonia 10/02/23: 43 umol/L.  CBG ranges from 123-165 mg/dL over the last 24 hours HgbA1c 8.9  Diet Order:   Diet Order     None       EDUCATION NEEDS:   Not appropriate for education at this time  Skin:  Skin Assessment: Skin Integrity Issues: Skin Integrity Issues:: Other (Comment) Other: L arm full thickness to muscle, ncrotic wound  Last BM:  10/06/23, type 6  Height:   Ht Readings from Last 1 Encounters:  10/02/23 5\' 8"  (1.727 m)    Weight:   Wt Readings from Last 1 Encounters:  10/09/23 (P) 114.9 kg    BMI:  Body mass index is 38.52 kg/m (pended).  Estimated Nutritional Needs:   Kcal:  2300-2500  Protein:  120-140 grams  Fluid:  >2 L/day   Elliot Dally, RD Registered Dietitian  See Amion for more information

## 2023-10-09 NOTE — Progress Notes (Signed)
Pt passed bedside swallow test.

## 2023-10-10 ENCOUNTER — Inpatient Hospital Stay (HOSPITAL_COMMUNITY)

## 2023-10-10 DIAGNOSIS — R569 Unspecified convulsions: Secondary | ICD-10-CM | POA: Diagnosis not present

## 2023-10-10 DIAGNOSIS — R609 Edema, unspecified: Secondary | ICD-10-CM | POA: Diagnosis not present

## 2023-10-10 DIAGNOSIS — F10931 Alcohol use, unspecified with withdrawal delirium: Secondary | ICD-10-CM | POA: Diagnosis not present

## 2023-10-10 LAB — CBC
HCT: 31.1 % — ABNORMAL LOW (ref 39.0–52.0)
Hemoglobin: 9.6 g/dL — ABNORMAL LOW (ref 13.0–17.0)
MCH: 31.8 pg (ref 26.0–34.0)
MCHC: 30.9 g/dL (ref 30.0–36.0)
MCV: 103 fL — ABNORMAL HIGH (ref 80.0–100.0)
Platelets: 360 10*3/uL (ref 150–400)
RBC: 3.02 MIL/uL — ABNORMAL LOW (ref 4.22–5.81)
RDW: 18.6 % — ABNORMAL HIGH (ref 11.5–15.5)
WBC: 10 10*3/uL (ref 4.0–10.5)
nRBC: 1.3 % — ABNORMAL HIGH (ref 0.0–0.2)

## 2023-10-10 LAB — GLUCOSE, CAPILLARY
Glucose-Capillary: 109 mg/dL — ABNORMAL HIGH (ref 70–99)
Glucose-Capillary: 112 mg/dL — ABNORMAL HIGH (ref 70–99)
Glucose-Capillary: 123 mg/dL — ABNORMAL HIGH (ref 70–99)
Glucose-Capillary: 126 mg/dL — ABNORMAL HIGH (ref 70–99)
Glucose-Capillary: 127 mg/dL — ABNORMAL HIGH (ref 70–99)
Glucose-Capillary: 77 mg/dL (ref 70–99)
Glucose-Capillary: 90 mg/dL (ref 70–99)

## 2023-10-10 LAB — COMPREHENSIVE METABOLIC PANEL
ALT: 61 U/L — ABNORMAL HIGH (ref 0–44)
AST: 79 U/L — ABNORMAL HIGH (ref 15–41)
Albumin: 1.9 g/dL — ABNORMAL LOW (ref 3.5–5.0)
Alkaline Phosphatase: 286 U/L — ABNORMAL HIGH (ref 38–126)
Anion gap: 6 (ref 5–15)
BUN: 16 mg/dL (ref 6–20)
CO2: 24 mmol/L (ref 22–32)
Calcium: 7.4 mg/dL — ABNORMAL LOW (ref 8.9–10.3)
Chloride: 108 mmol/L (ref 98–111)
Creatinine, Ser: 0.49 mg/dL — ABNORMAL LOW (ref 0.61–1.24)
GFR, Estimated: >60 mL/min (ref >60)
Glucose, Bld: 87 mg/dL (ref 70–99)
Potassium: 4.1 mmol/L (ref 3.5–5.1)
Sodium: 138 mmol/L (ref 135–145)
Total Bilirubin: 2.1 mg/dL — ABNORMAL HIGH (ref 0.0–1.2)
Total Protein: 4.6 g/dL — ABNORMAL LOW (ref 6.5–8.1)

## 2023-10-10 LAB — MAGNESIUM: Magnesium: 1.7 mg/dL (ref 1.7–2.4)

## 2023-10-10 LAB — PHOSPHORUS: Phosphorus: 2.9 mg/dL (ref 2.5–4.6)

## 2023-10-10 MED ORDER — AMOXICILLIN-POT CLAVULANATE 875-125 MG PO TABS
1.0000 | ORAL_TABLET | Freq: Two times a day (BID) | ORAL | Status: AC
  Administered 2023-10-10 – 2023-10-11 (×4): 1 via ORAL
  Filled 2023-10-10 (×4): qty 1

## 2023-10-10 MED ORDER — AMOXICILLIN-POT CLAVULANATE 875-125 MG PO TABS: 1.0000 | ORAL_TABLET | Freq: Two times a day (BID) | ORAL | Status: DC

## 2023-10-10 MED ORDER — DOXYCYCLINE HYCLATE 100 MG PO TABS
100.0000 mg | ORAL_TABLET | Freq: Two times a day (BID) | ORAL | Status: AC
  Administered 2023-10-10 – 2023-10-11 (×4): 100 mg via ORAL
  Filled 2023-10-10 (×4): qty 1

## 2023-10-10 MED ORDER — NICOTINE 14 MG/24HR TD PT24
14.0000 mg | MEDICATED_PATCH | Freq: Every day | TRANSDERMAL | Status: DC
  Administered 2023-10-10 – 2023-10-13 (×4): 14 mg via TRANSDERMAL
  Filled 2023-10-10 (×7): qty 1

## 2023-10-10 MED ORDER — ENSURE ENLIVE PO LIQD
237.0000 mL | Freq: Two times a day (BID) | ORAL | Status: DC
  Administered 2023-10-10 – 2023-10-17 (×10): 237 mL via ORAL

## 2023-10-10 MED ORDER — OXCARBAZEPINE 300 MG PO TABS
300.0000 mg | ORAL_TABLET | Freq: Every day | ORAL | Status: DC
  Administered 2023-10-10 – 2023-10-17 (×8): 300 mg via ORAL
  Filled 2023-10-10 (×8): qty 1

## 2023-10-10 MED ORDER — HALOPERIDOL LACTATE 5 MG/ML IJ SOLN
2.0000 mg | Freq: Four times a day (QID) | INTRAMUSCULAR | Status: DC | PRN
  Administered 2023-10-10 – 2023-10-16 (×2): 2 mg via INTRAVENOUS
  Filled 2023-10-10 (×2): qty 1

## 2023-10-10 MED ORDER — OXCARBAZEPINE 300 MG PO TABS
600.0000 mg | ORAL_TABLET | Freq: Every day | ORAL | Status: DC
  Administered 2023-10-10 – 2023-10-16 (×7): 600 mg via ORAL
  Filled 2023-10-10 (×9): qty 2

## 2023-10-10 MED ORDER — LORAZEPAM 2 MG/ML IJ SOLN
1.0000 mg | INTRAMUSCULAR | Status: DC | PRN
  Administered 2023-10-10: 1 mg via INTRAVENOUS
  Filled 2023-10-10: qty 1

## 2023-10-10 MED ORDER — NORTRIPTYLINE HCL 25 MG PO CAPS
50.0000 mg | ORAL_CAPSULE | Freq: Every day | ORAL | Status: DC
  Administered 2023-10-10 – 2023-10-16 (×7): 50 mg via ORAL
  Filled 2023-10-10 (×9): qty 2

## 2023-10-10 MED ORDER — DULOXETINE HCL 60 MG PO CPEP
60.0000 mg | ORAL_CAPSULE | Freq: Every day | ORAL | Status: DC
  Administered 2023-10-10: 30 mg via ORAL
  Filled 2023-10-10: qty 1

## 2023-10-10 MED ORDER — GADOBUTROL 1 MMOL/ML IV SOLN: 10.0000 mL | Freq: Once | INTRAVENOUS | Status: DC | PRN

## 2023-10-10 MED ORDER — HYDROCODONE-ACETAMINOPHEN 7.5-325 MG PO TABS
1.0000 | ORAL_TABLET | Freq: Three times a day (TID) | ORAL | Status: DC | PRN
  Administered 2023-10-10 – 2023-10-14 (×12): 1 via ORAL
  Filled 2023-10-10 (×13): qty 1

## 2023-10-10 MED ORDER — FEBUXOSTAT 40 MG PO TABS
40.0000 mg | ORAL_TABLET | Freq: Every day | ORAL | Status: DC
  Administered 2023-10-10 – 2023-10-17 (×8): 40 mg via ORAL
  Filled 2023-10-10 (×8): qty 1

## 2023-10-10 NOTE — Progress Notes (Signed)
 Per order, foley to be discontinued. Removed by this RN and noted brown exudate on tip of catheter. Patient strongly expressed burning sensation during removal. Urethra opening bright red. Peri care completed. Allena Katz MD notified and aware.

## 2023-10-10 NOTE — Progress Notes (Signed)
 Patient off unit for MR left forearm.

## 2023-10-10 NOTE — TOC Initial Note (Signed)
 Transition of Care [facility]) - Initial/Assessment Note    Patient Details  Name: Connor Copeland MRN: [id number] Date of Birth: 08-05-1976  [facility]) CM/SW Contact:    Lorri Frederick, LCSW Phone Number: 10/10/2023, 3:22 PM  Clinical Narrative:    Pt oriented x2.  He was able to engage in conversation, some appropriate and then some that did not make any sense.  Pt reports he is from home alone.  Mother and sister are contacts.  York Spaniel he works at [employer].  Pt does not appear to be capable of making discharge planning decisions at this time.  Recommendation for SNF, has not been sent out.  TOC will continue to follow.                 Expected Discharge Plan: Skilled Nursing Facility Barriers to Discharge: Continued Medical Work up   Patient Goals and CMS Choice            Expected Discharge Plan and Services       Living arrangements for the past 2 months: Single Family Home                                      Prior Living Arrangements/Services Living arrangements for the past 2 months: Single Family Home Lives with:: Self                   Activities of Daily Living      Permission Sought/Granted                  Emotional Assessment Appearance:: Appears older than stated age Attitude/Demeanor/Rapport: Engaged Affect (typically observed): Other (comment) (confused) Orientation: : Oriented to Self, Oriented to Place Alcohol / Substance Use: Alcohol Use    Admission diagnosis:  Hyponatremia [E87.1] Alcohol withdrawal seizure (HCC) [F10.939, R56.9] Alcohol withdrawal seizure with delirium (HCC) [F10.931, R56.9] 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] Patient Active Problem List   Diagnosis Date Noted   Hypokalemia 10/06/2023   Lactic acidosis 10/06/2023   Acute kidney injury (HCC) 10/06/2023   High anion gap metabolic acidosis 10/06/2023   SVT (supraventricular tachycardia) (HCC) 08/13/2023   Severe  alcohol withdrawal with perceptual disturbances (HCC) 08/09/2023   Hyperglycemia 08/08/2023   Gait disturbance 08/04/2023   Dysesthesia 08/04/2023   Seizure disorder (HCC) 03/19/2023   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   Neuropathic pain 03/19/2023   Alcohol withdrawal seizure (HCC) 01/28/2023   Alcohol abuse 01/28/2023   Tobacco use disorder 01/28/2023   Hyponatremia 01/28/2023   Elevated MCV 01/28/2023   Macrocytic anemia 01/28/2023   Elevated transaminase level 01/28/2023   Gout 01/28/2023   HLD (hyperlipidemia) 12/11/2022   Hypertriglyceridemia 12/11/2022   Peripheral neuropathy 12/11/2022   Hypoalbuminemia 12/11/2022   PCP:  Benita Stabile, MD Pharmacy:   [facility] - [place], [place] - [address]  [place] [address] Phone: [phone] Fax: [phone]  [facility] [address] [place] [address] Phone: [phone] Fax: [phone]  [facility] [address] [place] [address] Phone: [phone] Fax: [phone]     Social Drivers of Health (SDOH) Social History: SDOH Screenings   Food Insecurity: Patient Unable To Answer (10/05/2023)  Housing: Patient Unable To Answer (10/05/2023)  Transportation Needs: Patient Unable To Answer (10/05/2023)  Utilities: Patient Unable To  Answer (10/05/2023)  Tobacco Use: High Risk (08/23/2023)   SDOH Interventions:     Readmission Risk Interventions    08/19/2023   10:48 AM  Readmission Risk Prevention Plan  Transportation Screening Complete  PCP or Specialist Appt within 3-5 Days Not Complete  HRI or Home Care Consult Complete  Social Work Consult for Recovery Care Planning/Counseling Complete  Palliative Care Screening Not Applicable  Medication Review Oceanographer) Complete

## 2023-10-10 NOTE — Plan of Care (Signed)

## 2023-10-10 NOTE — Progress Notes (Signed)
 Right upper extremity venous duplex has been completed. Preliminary results can be found in CV Proc through chart review.   10/10/23 10:46 AM Olen Cordial RVT

## 2023-10-10 NOTE — Progress Notes (Signed)
 [facility] Progress Note Patient: Connor Copeland [id number]:[id number] DOB: October 08, 1975 DOA: 10/02/2023  DOS: the patient was seen and examined on 10/10/2023  Brief Hospital Course: PMH of CIDP, alcohol abuse, HTN, type II DM, mood disorder, presented to the hospital with complaints of seizures and any pain. Was transferred to [facility] under ICU care. Also found to have multiple wounds, orthopedic was consulted for the same.  Had a Cortrak for nutrition as was not safe to swallow. Currently mentation improving.  Assessment and Plan: Alcohol withdrawal seizure Last drink was 4 days prior to admission. Has completed his phenobarbital taper. Continue Ativan as needed. Continue thiamine and folate. PT OT evaluated the patient, will need skilled nursing facility. Continue seizure precautions. No old orders for 3/7.  Able to Tolerate Dysphagia 3 Diet   Aspiration pneumonia: Treated with IV Unasyn. Blood cultures negative. No respiratory distress right now. Will switch to Augmentin.   High anion gap metabolic acidosis/lactic acidosis: Likely due to seizure plus or minus starvation ketosis activity now clear with conservative management.   Transaminitis Hypoalbuminemia. Hyperbilirubinemia. LFT elevation cause, possible alcohol hepatitis versus hemodynamic injury. Improving on recheck. Hypoalbuminemia likely the cause of his third spacing as well as edema.  Self diuresing.   Acute kidney injury: Baseline creatinine 0.7.  On admission creatinine 1.26.  Treated with IV fluid. Hemodynamically mediated.   Hypokalemia. Hyponatremia. Hypocalcemia. Monitor. Replace as needed.   Left upper extremity wounds present on admission: Wound care was consulted. Orthopedic was also consulted.  Initially recommended  reconsultation once he is less agitated after an MRI with and without contrast. Was empirically on IV vancomycin.  MRSA PCR is negative.  Blood cultures are negative.  Plan was to  switch to doxycycline although currently no indication to cover for atypical organisms or MRSA therefore we will continue with Augmentin. Based on my discussion with orthopedic on 3/7 will get MRI with and without contrast for further evaluation of depth of the soft tissue infection.  IV Ativan as needed for MRI.   Incidental bilateral hygromas: Suspect that he may have a history of recurrent fall in the setting of alcohol abuse. For now monitor.   Type 2 diabetes mellitus, uncontrolled with hyper and hypoglycemia without long-term insulin use without complication. At home he was using metformin and linagliptin. Continue to hold oral hypoglycemic agent. Continue sliding scale insulin.  Every 4 hours for now.  Switch to ACHS tomorrow.   Adult failure to thrive In the setting of alcohol use disorder. Was receiving tube feeds via core track. Monitor for improvement in oral intake.   CIDP: Continue Cymbalta.   Obesity Class 1 Body mass index is 38.41 kg/m.  Placing the pt at higher risk of poor outcomes.  Mood disorder. Home regimen includes Cymbalta at 120 mg, Pamelor at 50 mg, Trileptal 300-600 mg. Will resume.  HTN. Was on amlodipine as well as Cardizem both. Currently blood pressure is good enough and we will continue to hold both medications.  If needed should only be on 1 calcium channel blocker.   Subjective: No nausea no vomiting.  More alert.  Able to follow commands.  Hungry.   Physical Exam: General: in Mild distress, No Rash, wounds unchanged since admission. Cardiovascular: S1 and S2 Present, No Murmur Respiratory: Good respiratory effort, Bilateral Air entry present. No Crackles, No wheezes Abdomen: Bowel Sound present, No tenderness Extremities: No edema Neuro: Alert and oriented x3, no new focal deficit  Data Reviewed: I have Reviewed nursing notes, Vitals, and  Lab results. Since last encounter, pertinent lab results CBC and BMP   . I have ordered test  including CBC and CMP  . I have discussed pt's care plan and test results with orthopedics  .  MRI of left upper extremity  Disposition: Status is: Inpatient Remains inpatient appropriate because: Monitor for improvement in mentation, oral intake as well as further workup of left upper extremity infection  enoxaparin (LOVENOX) injection 40 mg Start: 10/10/23 1000 SCDs Start: 10/02/23 1700   Family Communication: No one at bedside Level of care: Progressive   Vitals:   10/10/23 0350 10/10/23 0758 10/10/23 1221 10/10/23 1632  BP: (!) 115/59 (!) 127/96 125/81 120/74  Pulse: 98 99 99 97  Resp: 17 20 13 16   Temp: 98.1 F (36.7 C) 97.7 F (36.5 C) 98.1 F (36.7 C) 97.9 F (36.6 C)  TempSrc: Oral Axillary Oral Oral  SpO2: 97% 92% 95% 96%  Weight: 114.6 kg     Height:         Author: Lynden Oxford, MD 10/10/2023 5:31 PM  Please look on [email or web address] to find out who is on call.

## 2023-10-10 NOTE — Progress Notes (Signed)
 Speech Language Pathology Treatment: Dysphagia  Patient Details Name: Connor Copeland MRN: [id number] DOB: Jan 06, 1976 Today's Date: 10/10/2023 Time: 7846-9629 SLP Time Calculation (min) (ACUTE ONLY): 15 min  Assessment / Plan / Recommendation Clinical Impression  Pt's mentation is noticeably improved but still with persistent confusion and decreased awareness. He consumed a variety of POs with intermittent, delayed throat clearing noted with all consistencies, but no overt coughing could be elicited. SLP did challenge him with liquids but he does not want to take a lot of solids. Min cues were given for sustained attention and oral holding which did clear well when given verbal cues. He is more appropriate to resume PO diet with prognosis good for improvement as his mentation continues to clear. Will start with Dys 3 diet and thin liquids. Pt to likely benefit from assistance during meals for now.   HPI HPI: 48 year old male with past medical history of chronic inflammatory demyelinating polyneuropathy, alcohol use disorder, seizures, hyponatremia who presented to [facility] ED on 2/27 with altered mental status. Reported last drink 3-4 days ago. Becoming more aggressive, multiple falls being treated for ETOH withdrawal. Pt s/p cortrak 2/28 due to AMS.      SLP Plan  Continue with current plan of care      Recommendations for follow up therapy are one component of a multi-disciplinary discharge planning process, led by the attending physician.  Recommendations may be updated based on patient status, additional functional criteria and insurance authorization.    Recommendations  Diet recommendations: Dysphagia 3 (mechanical soft);Thin liquid Liquids provided via: Cup;Straw Medication Administration: Whole meds with puree Supervision: Full supervision/cueing for compensatory strategies;Staff to assist with self feeding Compensations: Minimize environmental distractions;Slow rate;Small  sips/bites Postural Changes and/or Swallow Maneuvers: Seated upright 90 degrees;Upright 30-60 min after meal                  Oral care BID     Dysphagia, oral phase (R13.11)     Continue with current plan of care     Mahala Menghini., M.A. CCC-SLP Acute Rehabilitation Services Office [phone]  Secure chat preferred   10/10/2023, 9:50 AM

## 2023-10-10 NOTE — Progress Notes (Signed)
 Patient returned to unit.

## 2023-10-10 NOTE — Hospital Course (Addendum)
 PMH of CIDP, alcohol abuse, HTN, type II DM, mood disorder, presented to the hospital with complaints of seizures and any pain. Was transferred to [facility] under ICU care. Also found to have multiple wounds, orthopedic was consulted for the same.  Had a Cortrak for nutrition as was not safe to swallow. Currently mentation improving.  Assessment and Plan: Alcohol withdrawal seizure Last drink was 4 days prior to admission. Has completed his phenobarbital taper. Continue Ativan as needed. Continue thiamine and folate. PT OT evaluated the patient, will need skilled nursing facility. Continue seizure precautions.   Aspiration pneumonia: Treated with IV Unasyn. Blood cultures negative. No respiratory distress right now. Was Augmentin.  Last day 3/8.  Left upper extremity wounds present on admission: Wound care was consulted. Orthopedic was also consulted. Orthopedic recommended MRI. Currently receiving wound care. Unable to complete MRI. CT ordered.  Still not read.  I have called on 3/10 at 11 AM, per radiology there is some technical issue with images themselves.  Will follow-up on the recommendation. Initially was on vancomycin.  MRSA PCR negative blood culture negative was on doxycycline.  Currently antibiotics course is completed.   High anion gap metabolic acidosis/lactic acidosis: Likely due to seizure plus or minus starvation ketosis activity now clear with conservative management.   Transaminitis Hypoalbuminemia. Hyperbilirubinemia. LFT elevation cause, possible alcohol hepatitis versus hemodynamic injury. Improving on recheck. Hypoalbuminemia likely the cause of his third spacing as well as edema.  Self diuresing.   Acute kidney injury: Baseline creatinine 0.7.  On admission creatinine 1.26.  Treated with IV fluid. Hemodynamically mediated.   Hypokalemia. Hyponatremia. Hypocalcemia. Monitor. Replace as needed.   Incidental bilateral hygromas: Suspect that he may have a  history of recurrent fall in the setting of alcohol abuse. For now monitor.   Type 2 diabetes mellitus, uncontrolled with hyper and hypoglycemia without long-term insulin use without complication. At home he was using metformin and linagliptin. Continue to hold oral hypoglycemic agent. Continue sliding scale insulin.   Adult failure to thrive In the setting of alcohol use disorder. Was receiving tube feeds via core track. Monitor for improvement in oral intake.   CIDP: Continue Cymbalta.   Obesity Class 1 Body mass index is 38.41 kg/m.  Placing the pt at higher risk of poor outcomes.  Mood disorder. Home regimen includes Cymbalta at 120 mg, Pamelor at 50 mg, Trileptal 300-600 mg. Will resume.  HTN. Was on amlodipine as well as Cardizem both. Currently blood pressure is good enough and we will continue to hold both medications.  If needed should only be on 1 calcium channel blocker.  Hypoalbuminemia and third spacing. Continue with IV Lasix for now.  Hypokalemia Hypomagnesemia.   Replacing.

## 2023-10-10 NOTE — Progress Notes (Signed)
 Nutrition Follow-up  DOCUMENTATION CODES:   Not applicable  INTERVENTION:   Ensure Enlive po BID, each supplement provides 350 kcal and 20 grams of protein. 1 packet Juven BID, each packet provides 95 calories, 2.5 grams of protein (collagen),to support wound healing If good PO intake recommend double proteins with meals  MVI with minerals daily  Continue Thiamine 500 mg daily x 5 days (to end 3/6) then 100 mg Thiamine daily thereafter   NUTRITION DIAGNOSIS:   Increased nutrient needs related to wound healing as evidenced by estimated needs.  - Still applicable   GOAL:   Patient will meet greater than or equal to 90% of their needs  - Progressing   MONITOR:   TF tolerance  REASON FOR ASSESSMENT:   Consult Enteral/tube feeding initiation and management  ASSESSMENT:   Pt with PMH of chronic demyelinating polyneuropathy, ETOH use disorder, seizures, and hyponatremia admitted with ETOH withdrawal and seizures.  2/28 - s/p cortrak tube; tip gastric  3/1 - Dysphagia 3, thin liquids 3/2 - Transferred out of ICU 3/4 - NPO  3/6 - Cortrak removed, TF removed  3/7 - Dysphagia 3, thin liquids  Patient advanced to dysphagia 3 diet today and cortrak removed. Patient's mentation has improved since last visit but still with slight confusion. Stated he "was hungry." Breakfast meal at bedside and patient was actively eating. He reports he had no issues eating PTA. Patient wary of trying oral nutritional supplements, but was ok with trying Ensure. Per RN since lactulose discontinued yesterday, BM have slowed.    Admit weight: 105.1 kg  Current weight: 114.6 kg    Average Meal Intake: Diet just advanced   Nutritionally Relevant Medications: Scheduled Meds:  amoxicillin-clavulanate  1 tablet Oral Q12H   doxycycline  100 mg Oral Q12H   DULoxetine  60 mg Oral Daily   feeding supplement  237 mL Oral BID BM   insulin aspart  0-9 Units Subcutaneous Q4H   nutrition supplement  (JUVEN)  1 packet Oral BID BM   Continuous Infusions:  thiamine (VITAMIN B1) injection 500 mg (10/10/23 0852)   Labs Reviewed: Creatinine 0.49,Alkaline phosphatase 286, AST 79, ALT 61 CBG ranges from 77-132 mg/dL over the last 24 hours HgbA1c 8.9   Diet Order:   Diet Order             DIET DYS 3 Room service appropriate? Yes with Assist; Fluid consistency: Thin  Diet effective now                   EDUCATION NEEDS:   Not appropriate for education at this time  Skin:  Skin Assessment: Skin Integrity Issues: Skin Integrity Issues:: Other (Comment) Other: L arm full thickness to muscle, ncrotic wound  Last BM:  10/10/23 Type 6  Height:   Ht Readings from Last 1 Encounters:  10/02/23 5\' 8"  (1.727 m)    Weight:   Wt Readings from Last 1 Encounters:  10/10/23 114.6 kg    BMI:  Body mass index is 38.41 kg/m.  Estimated Nutritional Needs:   Kcal:  2300-2500  Protein:  120-140 grams  Fluid:  >2 L/day   Elliot Dally, RD Registered Dietitian  See Amion for more information

## 2023-10-10 NOTE — [place] FL2 (Signed)
 [place] MEDICAID FL2 LEVEL OF CARE FORM     IDENTIFICATION  Patient Name: Connor Copeland Birthdate: Sep 22, 1975 Sex: male Admission Date (Current Location): 10/02/2023  [facility] and [name] Number:  [name] and Address:  [facility] [place], [address] [place], [place] [address]      Provider Number: [id number]  Attending Physician Name and Address:  Rolly Salter, MD  Relative Name and Phone Number:       Current Level of Care: Hospital Recommended Level of Care: Skilled Nursing Facility Prior Approval Number:    Date Approved/Denied:   PASRR Number: [id number]  Discharge Plan: SNF    Current Diagnoses: Patient Active Problem List   Diagnosis Date Noted   Hypokalemia 10/06/2023   Lactic acidosis 10/06/2023   Acute kidney injury (HCC) 10/06/2023   High anion gap metabolic acidosis 10/06/2023   SVT (supraventricular tachycardia) (HCC) 08/13/2023   Severe alcohol withdrawal with perceptual disturbances (HCC) 08/09/2023   Hyperglycemia 08/08/2023   Gait disturbance 08/04/2023   Dysesthesia 08/04/2023   Seizure disorder (HCC) 03/19/2023   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   Neuropathic pain 03/19/2023   Alcohol withdrawal seizure (HCC) 01/28/2023   Alcohol abuse 01/28/2023   Tobacco use disorder 01/28/2023   Hyponatremia 01/28/2023   Elevated MCV 01/28/2023   Macrocytic anemia 01/28/2023   Elevated transaminase level 01/28/2023   Gout 01/28/2023   HLD (hyperlipidemia) 12/11/2022   Hypertriglyceridemia 12/11/2022   Peripheral neuropathy 12/11/2022   Hypoalbuminemia 12/11/2022    Orientation RESPIRATION BLADDER Height & Weight     Self, Place  Normal Continent, External catheter Weight: 252 lb 10.4 oz (114.6 kg) Height:  5\' 8"  (172.7 cm)  BEHAVIORAL SYMPTOMS/MOOD NEUROLOGICAL BOWEL NUTRITION STATUS      Incontinent Diet (See dc summary)  AMBULATORY STATUS COMMUNICATION OF  NEEDS Skin   Extensive Assist Verbally Other (Comment) (non-pressure wound on arm)                       Personal Care Assistance Level of Assistance  Bathing, Feeding, Dressing Bathing Assistance: Maximum assistance Feeding assistance: Limited assistance Dressing Assistance: Maximum assistance     Functional Limitations Info             SPECIAL CARE FACTORS FREQUENCY  PT (By licensed PT), OT (By licensed OT), Speech therapy     PT Frequency: 5x/week OT Frequency: 5x/week     Speech Therapy Frequency: 2x/week      Contractures Contractures Info: Not present    Additional Factors Info  Code Status, Allergies Code Status Info: Full Allergies Info: Neurotin           Current Medications (10/10/2023):  This is the current hospital active medication list Current Facility-Administered Medications  Medication Dose Route Frequency Provider Last Rate Last Admin   acetaminophen (TYLENOL) tablet 650 mg  650 mg Oral Q6H PRN Rolly Salter, MD   650 mg at 10/10/23 0849   amoxicillin-clavulanate (AUGMENTIN) 875-125 MG per tablet 1 tablet  1 tablet Oral Q12H Rolly Salter, MD   1 tablet at 10/10/23 1220   docusate (COLACE) 50 MG/5ML liquid 100 mg  100 mg Oral BID PRN Mannam, Praveen, MD       doxycycline (VIBRA-TABS) tablet 100 mg  100 mg Oral Q12H Rolly Salter, MD   100 mg at 10/10/23 1220   DULoxetine (CYMBALTA) DR capsule 60 mg  60 mg Oral Daily Lynden Oxford M,  MD   30 mg at 10/10/23 1017   enoxaparin (LOVENOX) injection 40 mg  40 mg Subcutaneous Q24H Rolly Salter, MD   40 mg at 10/10/23 0849   febuxostat (ULORIC) tablet 40 mg  40 mg Oral Daily Rolly Salter, MD   40 mg at 10/10/23 1018   feeding supplement (ENSURE ENLIVE / ENSURE PLUS) liquid 237 mL  237 mL Oral BID BM Rolly Salter, MD   237 mL at 10/10/23 1330   Gerhardt's butt cream   Topical PRN Marinda Elk, MD   Given at 10/07/23 1917   haloperidol lactate (HALDOL) injection 2 mg  2 mg  Intravenous Q6H PRN Rolly Salter, MD       HYDROcodone-acetaminophen (NORCO) 7.5-325 MG per tablet 1 tablet  1 tablet Oral TID PRN Rolly Salter, MD   1 tablet at 10/10/23 1018   insulin aspart (novoLOG) injection 0-9 Units  0-9 Units Subcutaneous Q4H Cristopher Peru, PA-C   1 Units at 10/10/23 1221   leptospermum manuka honey (MEDIHONEY) paste 1 Application  1 Application Topical Daily Lynnell Catalan, MD   1 Application at 10/10/23 1018   lidocaine (XYLOCAINE) 2 % jelly 1 Application  1 Application Urethral Once Duayne Cal, NP       LORazepam (ATIVAN) injection 1 mg  1 mg Intravenous Q15 min PRN Rolly Salter, MD       LORazepam (ATIVAN) injection 1-2 mg  1-2 mg Intravenous Q1H PRN Calton Dach I, RPH   2 mg at 10/09/23 2224   nicotine (NICODERM CQ - dosed in mg/24 hours) patch 14 mg  14 mg Transdermal Daily Rolly Salter, MD   14 mg at 10/10/23 1018   nortriptyline (PAMELOR) capsule 50 mg  50 mg Oral QHS Rolly Salter, MD       nutrition supplement (JUVEN) (JUVEN) powder packet 1 packet  1 packet Oral BID BM Marinda Elk, MD   1 packet at 10/10/23 1330   Oral care mouth rinse  15 mL Mouth Rinse 4 times per day Lynnell Catalan, MD   15 mL at 10/10/23 0758   Oral care mouth rinse  15 mL Mouth Rinse PRN Lynnell Catalan, MD       Oxcarbazepine (TRILEPTAL) tablet 300 mg  300 mg Oral Daily Rolly Salter, MD   300 mg at 10/10/23 1018   Oxcarbazepine (TRILEPTAL) tablet 600 mg  600 mg Oral QHS Rolly Salter, MD       thiamine (VITAMIN B1) 500 mg in sodium chloride 0.9 % 50 mL IVPB  500 mg Intravenous Daily Rolly Salter, MD 110 mL/hr at 10/10/23 0852 500 mg at 10/10/23 6213   white petrolatum (VASELINE) gel   Topical PRN Marinda Elk, MD   0.2 Application at 10/07/23 1727     Discharge Medications: Please see discharge summary for a list of discharge medications.  Relevant Imaging Results:  Relevant Lab Results:   Additional Information SSN: [id number]  [address] [place], [place]

## 2023-10-11 ENCOUNTER — Inpatient Hospital Stay (HOSPITAL_COMMUNITY)

## 2023-10-11 DIAGNOSIS — J690 Pneumonitis due to inhalation of food and vomit: Secondary | ICD-10-CM | POA: Diagnosis present

## 2023-10-11 DIAGNOSIS — R569 Unspecified convulsions: Secondary | ICD-10-CM | POA: Diagnosis not present

## 2023-10-11 DIAGNOSIS — F10931 Alcohol use, unspecified with withdrawal delirium: Secondary | ICD-10-CM | POA: Diagnosis not present

## 2023-10-11 DIAGNOSIS — S41102A Unspecified open wound of left upper arm, initial encounter: Secondary | ICD-10-CM | POA: Diagnosis present

## 2023-10-11 DIAGNOSIS — G9608 Other cranial cerebrospinal fluid leak: Secondary | ICD-10-CM | POA: Diagnosis present

## 2023-10-11 DIAGNOSIS — E669 Obesity, unspecified: Secondary | ICD-10-CM | POA: Diagnosis present

## 2023-10-11 DIAGNOSIS — R7989 Other specified abnormal findings of blood chemistry: Secondary | ICD-10-CM | POA: Diagnosis present

## 2023-10-11 LAB — GLUCOSE, CAPILLARY
Glucose-Capillary: 87 mg/dL (ref 70–99)
Glucose-Capillary: 108 mg/dL — ABNORMAL HIGH (ref 70–99)
Glucose-Capillary: 179 mg/dL — ABNORMAL HIGH (ref 70–99)
Glucose-Capillary: 137 mg/dL — ABNORMAL HIGH (ref 70–99)
Glucose-Capillary: 129 mg/dL — ABNORMAL HIGH (ref 70–99)

## 2023-10-11 LAB — COMPREHENSIVE METABOLIC PANEL
ALT: 64 U/L — ABNORMAL HIGH (ref 0–44)
AST: 91 U/L — ABNORMAL HIGH (ref 15–41)
Albumin: 1.8 g/dL — ABNORMAL LOW (ref 3.5–5.0)
Alkaline Phosphatase: 302 U/L — ABNORMAL HIGH (ref 38–126)
Anion gap: 8 (ref 5–15)
BUN: 14 mg/dL (ref 6–20)
CO2: 23 mmol/L (ref 22–32)
Calcium: 7.8 mg/dL — ABNORMAL LOW (ref 8.9–10.3)
Chloride: 105 mmol/L (ref 98–111)
Creatinine, Ser: 0.63 mg/dL (ref 0.61–1.24)
GFR, Estimated: >60 mL/min (ref >60)
Glucose, Bld: 85 mg/dL (ref 70–99)
Potassium: 3.8 mmol/L (ref 3.5–5.1)
Sodium: 136 mmol/L (ref 135–145)
Total Bilirubin: 2.1 mg/dL — ABNORMAL HIGH (ref 0.0–1.2)
Total Protein: 4.7 g/dL — ABNORMAL LOW (ref 6.5–8.1)

## 2023-10-11 LAB — CBC
HCT: 31 % — ABNORMAL LOW (ref 39.0–52.0)
Hemoglobin: 9.7 g/dL — ABNORMAL LOW (ref 13.0–17.0)
MCH: 32.4 pg (ref 26.0–34.0)
MCHC: 31.3 g/dL (ref 30.0–36.0)
MCV: 103.7 fL — ABNORMAL HIGH (ref 80.0–100.0)
Platelets: 394 10*3/uL (ref 150–400)
RBC: 2.99 MIL/uL — ABNORMAL LOW (ref 4.22–5.81)
RDW: 18.7 % — ABNORMAL HIGH (ref 11.5–15.5)
WBC: 10.6 10*3/uL — ABNORMAL HIGH (ref 4.0–10.5)
nRBC: 0.6 % — ABNORMAL HIGH (ref 0.0–0.2)

## 2023-10-11 MED ORDER — IOHEXOL 350 MG/ML SOLN
75.0000 mL | Freq: Once | INTRAVENOUS | Status: AC | PRN
  Administered 2023-10-11: 75 mL via INTRAVENOUS

## 2023-10-11 MED ORDER — LORAZEPAM 1 MG PO TABS: 1.0000 mg | ORAL_TABLET | ORAL | Status: AC | PRN

## 2023-10-11 MED ORDER — INSULIN ASPART 100 UNIT/ML IJ SOLN
0.0000 [IU] | Freq: Three times a day (TID) | INTRAMUSCULAR | Status: DC
  Administered 2023-10-11: 1 [IU] via SUBCUTANEOUS
  Administered 2023-10-11: 2 [IU] via SUBCUTANEOUS
  Administered 2023-10-13: 1 [IU] via SUBCUTANEOUS
  Administered 2023-10-13: 2 [IU] via SUBCUTANEOUS
  Administered 2023-10-14: 1 [IU] via SUBCUTANEOUS
  Administered 2023-10-15: 2 [IU] via SUBCUTANEOUS
  Administered 2023-10-15: 1 [IU] via SUBCUTANEOUS

## 2023-10-11 MED ORDER — METHOCARBAMOL 500 MG PO TABS
500.0000 mg | ORAL_TABLET | Freq: Three times a day (TID) | ORAL | Status: DC | PRN
  Administered 2023-10-12 – 2023-10-16 (×8): 500 mg via ORAL
  Filled 2023-10-11 (×9): qty 1

## 2023-10-11 MED ORDER — LORAZEPAM 2 MG/ML IJ SOLN: 1.0000 mg | INTRAMUSCULAR | Status: AC | PRN

## 2023-10-11 MED ORDER — INSULIN ASPART 100 UNIT/ML IJ SOLN: 0.0000 [IU] | Freq: Every day | INTRAMUSCULAR | Status: DC

## 2023-10-11 MED ORDER — DULOXETINE HCL 60 MG PO CPEP
60.0000 mg | ORAL_CAPSULE | Freq: Two times a day (BID) | ORAL | Status: DC
  Administered 2023-10-11 – 2023-10-17 (×13): 60 mg via ORAL
  Filled 2023-10-11 (×13): qty 1

## 2023-10-11 NOTE — Progress Notes (Signed)
 Pt back to the unit. Dionne Bucy RN

## 2023-10-11 NOTE — Progress Notes (Signed)
 [facility] Progress Note Patient: Connor Copeland ZOX:[id number] DOB: 12-Jan-1976 DOA: 10/02/2023  DOS: the patient was seen and examined on 10/11/2023  Brief Hospital Course: PMH of CIDP, alcohol abuse, HTN, type II DM, mood disorder, presented to the hospital with complaints of seizures and any pain. Was transferred to [facility] under ICU care. Also found to have multiple wounds, orthopedic was consulted for the same.  Had a Cortrak for nutrition as was not safe to swallow. Currently mentation improving.  Assessment and Plan: Alcohol withdrawal seizure Last drink was 4 days prior to admission. Has completed his phenobarbital taper. Continue Ativan as needed. Continue thiamine and folate. PT OT evaluated the patient, will need skilled nursing facility. Continue seizure precautions.   Aspiration pneumonia: Treated with IV Unasyn. Blood cultures negative. No respiratory distress right now. Continue Augmentin.  Last day 3/9.  Left upper extremity wounds present on admission: Wound care was consulted. Orthopedic was also consulted. Orthopedic recommended MRI. Currently receiving wound care. Unable to complete MRI. CT ordered. Initially was on vancomycin.  MRSA PCR negative blood culture negative currently on doxycycline.   High anion gap metabolic acidosis/lactic acidosis: Likely due to seizure plus or minus starvation ketosis activity now clear with conservative management.   Transaminitis Hypoalbuminemia. Hyperbilirubinemia. LFT elevation cause, possible alcohol hepatitis versus hemodynamic injury. Improving on recheck. Hypoalbuminemia likely the cause of his third spacing as well as edema.  Self diuresing.   Acute kidney injury: Baseline creatinine 0.7.  On admission creatinine 1.26.  Treated with IV fluid. Hemodynamically mediated.   Hypokalemia. Hyponatremia. Hypocalcemia. Monitor. Replace as needed.   Incidental bilateral hygromas: Suspect that he may have  a history of recurrent fall in the setting of alcohol abuse. For now monitor.   Type 2 diabetes mellitus, uncontrolled with hyper and hypoglycemia without long-term insulin use without complication. At home he was using metformin and linagliptin. Continue to hold oral hypoglycemic agent. Continue sliding scale insulin.   Adult failure to thrive In the setting of alcohol use disorder. Was receiving tube feeds via core track. Monitor for improvement in oral intake.   CIDP: Continue Cymbalta.   Obesity Class 1 Body mass index is 38.41 kg/m.  Placing the pt at higher risk of poor outcomes.  Mood disorder. Home regimen includes Cymbalta at 120 mg, Pamelor at 50 mg, Trileptal 300-600 mg. Will resume.  HTN. Was on amlodipine as well as Cardizem both. Currently blood pressure is good enough and we will continue to hold both medications.  If needed should only be on 1 calcium channel blocker.    Subjective: No nausea no vomiting no fever no chills.  No chest pain.  Still somewhat confused.  Impulsive.  Physical Exam: General: in Mild distress, No Rash, unchanged ulcers on bilateral upper extremities. Cardiovascular: S1 and S2 Present, No Murmur Respiratory: Good respiratory effort, Bilateral Air entry present. No Crackles, No wheezes Abdomen: Bowel Sound present, No tenderness Extremities: Trace edema Neuro: Alert and oriented x3, no new focal deficit  Data Reviewed: I have Reviewed nursing notes, Vitals, and Lab results. Since last encounter, pertinent lab results CBC and BMP   . I have ordered test including CBC and CMP  . I have ordered imaging CT forearm  .    Disposition: Status is: Inpatient Remains inpatient appropriate because: Still confused requiring close observation.  Will need a SNF later.  Further workup for wounds.  enoxaparin (LOVENOX) injection 40 mg Start: 10/10/23 1000 SCDs Start: 10/02/23 1700   Family Communication: No  one at bedside Level of care:  Progressive   Vitals:   10/11/23 0752 10/11/23 1100 10/11/23 1147 10/11/23 1539  BP: (!) 123/96  112/78 118/79  Pulse: 100 (!) 108 (!) 108 (!) 103  Resp: (!) 21  16 18   Temp: 98.2 F (36.8 C)  98 F (36.7 C) 98.1 F (36.7 C)  TempSrc: Oral Oral Oral Oral  SpO2: 94%  97% 99%  Weight:      Height:         Author: Lynden Oxford, MD 10/11/2023 5:09 PM  Please look on [email or web address] to find out who is on call.

## 2023-10-11 NOTE — Progress Notes (Signed)
 Pt transported off unit to radiology for a CT scan. Dionne Bucy RN

## 2023-10-12 ENCOUNTER — Inpatient Hospital Stay (HOSPITAL_COMMUNITY)

## 2023-10-12 DIAGNOSIS — F10931 Alcohol use, unspecified with withdrawal delirium: Secondary | ICD-10-CM | POA: Diagnosis not present

## 2023-10-12 DIAGNOSIS — R569 Unspecified convulsions: Secondary | ICD-10-CM | POA: Diagnosis not present

## 2023-10-12 LAB — COMPREHENSIVE METABOLIC PANEL
ALT: 59 U/L — ABNORMAL HIGH (ref 0–44)
AST: 79 U/L — ABNORMAL HIGH (ref 15–41)
Albumin: 1.8 g/dL — ABNORMAL LOW (ref 3.5–5.0)
Alkaline Phosphatase: 314 U/L — ABNORMAL HIGH (ref 38–126)
Anion gap: 8 (ref 5–15)
BUN: 15 mg/dL (ref 6–20)
CO2: 24 mmol/L (ref 22–32)
Calcium: 7.7 mg/dL — ABNORMAL LOW (ref 8.9–10.3)
Chloride: 103 mmol/L (ref 98–111)
Creatinine, Ser: 0.62 mg/dL (ref 0.61–1.24)
GFR, Estimated: >60 mL/min (ref >60)
Glucose, Bld: 147 mg/dL — ABNORMAL HIGH (ref 70–99)
Potassium: 3.3 mmol/L — ABNORMAL LOW (ref 3.5–5.1)
Sodium: 135 mmol/L (ref 135–145)
Total Bilirubin: 2.1 mg/dL — ABNORMAL HIGH (ref 0.0–1.2)
Total Protein: 4.5 g/dL — ABNORMAL LOW (ref 6.5–8.1)

## 2023-10-12 LAB — GLUCOSE, CAPILLARY
Glucose-Capillary: 88 mg/dL (ref 70–99)
Glucose-Capillary: 120 mg/dL — ABNORMAL HIGH (ref 70–99)
Glucose-Capillary: 120 mg/dL — ABNORMAL HIGH (ref 70–99)
Glucose-Capillary: 130 mg/dL — ABNORMAL HIGH (ref 70–99)

## 2023-10-12 LAB — CBC
HCT: 29.9 % — ABNORMAL LOW (ref 39.0–52.0)
Hemoglobin: 9.5 g/dL — ABNORMAL LOW (ref 13.0–17.0)
MCH: 32.1 pg (ref 26.0–34.0)
MCHC: 31.8 g/dL (ref 30.0–36.0)
MCV: 101 fL — ABNORMAL HIGH (ref 80.0–100.0)
Platelets: 446 10*3/uL — ABNORMAL HIGH (ref 150–400)
RBC: 2.96 MIL/uL — ABNORMAL LOW (ref 4.22–5.81)
RDW: 18.3 % — ABNORMAL HIGH (ref 11.5–15.5)
WBC: 8.7 10*3/uL (ref 4.0–10.5)
nRBC: 0.2 % (ref 0.0–0.2)

## 2023-10-12 LAB — MAGNESIUM: Magnesium: 1.4 mg/dL — ABNORMAL LOW (ref 1.7–2.4)

## 2023-10-12 MED ORDER — FUROSEMIDE 10 MG/ML IJ SOLN
40.0000 mg | Freq: Once | INTRAMUSCULAR | Status: AC
  Administered 2023-10-12: 40 mg via INTRAVENOUS
  Filled 2023-10-12: qty 4

## 2023-10-12 MED ORDER — POTASSIUM CHLORIDE CRYS ER 20 MEQ PO TBCR
40.0000 meq | EXTENDED_RELEASE_TABLET | ORAL | Status: AC
  Administered 2023-10-12 (×2): 40 meq via ORAL
  Filled 2023-10-12 (×2): qty 2

## 2023-10-12 MED ORDER — MAGNESIUM SULFATE 4 GM/100ML IV SOLN
4.0000 g | Freq: Once | INTRAVENOUS | Status: AC
  Administered 2023-10-12: 4 g via INTRAVENOUS
  Filled 2023-10-12: qty 100

## 2023-10-12 MED ORDER — LACTULOSE 10 GM/15ML PO SOLN
20.0000 g | Freq: Every day | ORAL | Status: DC
  Administered 2023-10-12 – 2023-10-17 (×6): 20 g via ORAL
  Filled 2023-10-12 (×6): qty 30

## 2023-10-12 NOTE — Progress Notes (Signed)
 [facility] Progress Note Patient: Connor Copeland:[id number] DOB: Dec 24, 1975 DOA: 10/02/2023  DOS: the patient was seen and examined on 10/12/2023  Brief Hospital Course: PMH of CIDP, alcohol abuse, HTN, type II DM, mood disorder, presented to the hospital with complaints of seizures and any pain. Was transferred to [facility] under ICU care. Also found to have multiple wounds, orthopedic was consulted for the same.  Had a Cortrak for nutrition as was not safe to swallow. Currently mentation improving.  Assessment and Plan: Alcohol withdrawal seizure Last drink was 4 days prior to admission. Has completed his phenobarbital taper. Continue Ativan as needed. Continue thiamine and folate. PT OT evaluated the patient, will need skilled nursing facility. Continue seizure precautions.   Aspiration pneumonia: Treated with IV Unasyn. Blood cultures negative. No respiratory distress right now. Continue Augmentin.  Last day 3/9.  Left upper extremity wounds present on admission: Wound care was consulted. Orthopedic was also consulted. Orthopedic recommended MRI. Currently receiving wound care. Unable to complete MRI. CT ordered.  Still not read. Initially was on vancomycin.  MRSA PCR negative blood culture negative currently on doxycycline.   High anion gap metabolic acidosis/lactic acidosis: Likely due to seizure plus or minus starvation ketosis activity now clear with conservative management.   Transaminitis Hypoalbuminemia. Hyperbilirubinemia. LFT elevation cause, possible alcohol hepatitis versus hemodynamic injury. Improving on recheck. Hypoalbuminemia likely the cause of his third spacing as well as edema.  Self diuresing.   Acute kidney injury: Baseline creatinine 0.7.  On admission creatinine 1.26.  Treated with IV fluid. Hemodynamically mediated.   Hypokalemia. Hyponatremia. Hypocalcemia. Monitor. Replace as needed.   Incidental bilateral hygromas: Suspect  that he may have a history of recurrent fall in the setting of alcohol abuse. For now monitor.   Type 2 diabetes mellitus, uncontrolled with hyper and hypoglycemia without long-term insulin use without complication. At home he was using metformin and linagliptin. Continue to hold oral hypoglycemic agent. Continue sliding scale insulin.   Adult failure to thrive In the setting of alcohol use disorder. Was receiving tube feeds via core track. Monitor for improvement in oral intake.   CIDP: Continue Cymbalta.   Obesity Class 1 Body mass index is 38.41 kg/m.  Placing the pt at higher risk of poor outcomes.  Mood disorder. Home regimen includes Cymbalta at 120 mg, Pamelor at 50 mg, Trileptal 300-600 mg. Will resume.  HTN. Was on amlodipine as well as Cardizem both. Currently blood pressure is good enough and we will continue to hold both medications.  If needed should only be on 1 calcium channel blocker.  Hypoalbuminemia and third spacing. I will treat with IV Lasix and monitor.  Hypokalemia Hypomagnesemia.   Replacing.   Subjective: No nausea no vomiting.  Continues to have some right shoulder pain.   Physical Exam: General: in Mild distress, No Rash Cardiovascular: S1 and S2 Present, No Murmur Respiratory: Good respiratory effort, Bilateral Air entry present. No Crackles, No wheezes Abdomen: Bowel Sound present, No tenderness Extremities: Bilateral pedal edema Neuro: Alert and oriented x3, no new focal deficit  Data Reviewed: I have Reviewed nursing notes, Vitals, and Lab results. Since last encounter, pertinent lab results CBC BMP and magnesium   . I have ordered test including CBC BMP and magnesium  . I have ordered imaging x-ray shoulder  .   Disposition: Status is: Inpatient Remains inpatient appropriate because: Needing aggressive diuresis  enoxaparin (LOVENOX) injection 40 mg Start: 10/10/23 1000 SCDs Start: 10/02/23 1700   Family Communication: No  one at  bedside Level of care: Progressive   Vitals:   10/12/23 0700 10/12/23 0728 10/12/23 1113 10/12/23 1530  BP:  118/72 136/87 98/67  Pulse:  (!) 108 (!) 114 (!) 110  Resp:  18 16 16   Temp:  98 F (36.7 C) 98.5 F (36.9 C) 98.4 F (36.9 C)  TempSrc:  Oral Oral Oral  SpO2:  97% 99% 95%  Weight: 117.6 kg     Height:         Author: Lynden Oxford, MD 10/12/2023 6:10 PM  Please look on [email or web address] to find out who is on call.

## 2023-10-13 ENCOUNTER — Ambulatory Visit: Payer: 59 | Admitting: Neurology

## 2023-10-13 DIAGNOSIS — R569 Unspecified convulsions: Secondary | ICD-10-CM | POA: Diagnosis not present

## 2023-10-13 DIAGNOSIS — F10931 Alcohol use, unspecified with withdrawal delirium: Secondary | ICD-10-CM | POA: Diagnosis not present

## 2023-10-13 LAB — BASIC METABOLIC PANEL
Anion gap: 16 — ABNORMAL HIGH (ref 5–15)
BUN: 12 mg/dL (ref 6–20)
CO2: 20 mmol/L — ABNORMAL LOW (ref 22–32)
Calcium: 8.1 mg/dL — ABNORMAL LOW (ref 8.9–10.3)
Chloride: 101 mmol/L (ref 98–111)
Creatinine, Ser: 0.62 mg/dL (ref 0.61–1.24)
GFR, Estimated: >60 mL/min (ref >60)
Glucose, Bld: 104 mg/dL — ABNORMAL HIGH (ref 70–99)
Potassium: 3.5 mmol/L (ref 3.5–5.1)
Sodium: 137 mmol/L (ref 135–145)

## 2023-10-13 LAB — GLUCOSE, CAPILLARY
Glucose-Capillary: 135 mg/dL — ABNORMAL HIGH (ref 70–99)
Glucose-Capillary: 117 mg/dL — ABNORMAL HIGH (ref 70–99)
Glucose-Capillary: 170 mg/dL — ABNORMAL HIGH (ref 70–99)
Glucose-Capillary: 143 mg/dL — ABNORMAL HIGH (ref 70–99)

## 2023-10-13 LAB — MAGNESIUM: Magnesium: 1.8 mg/dL (ref 1.7–2.4)

## 2023-10-13 MED ORDER — FUROSEMIDE 10 MG/ML IJ SOLN
40.0000 mg | Freq: Two times a day (BID) | INTRAMUSCULAR | Status: AC
  Administered 2023-10-13 – 2023-10-14 (×3): 40 mg via INTRAVENOUS
  Filled 2023-10-13 (×3): qty 4

## 2023-10-13 MED ORDER — MAGNESIUM SULFATE 4 GM/100ML IV SOLN: 4.0000 g | Freq: Once | INTRAVENOUS | Status: DC

## 2023-10-13 MED ORDER — LIDOCAINE 5 % EX PTCH
1.0000 | MEDICATED_PATCH | CUTANEOUS | Status: DC
  Administered 2023-10-13 – 2023-10-14 (×2): 1 via TRANSDERMAL
  Filled 2023-10-13 (×2): qty 1

## 2023-10-13 NOTE — TOC Progression Note (Signed)
 Transition of Care [facility]) - Progression Note    Patient Details  Name: Connor Copeland MRN: [id number] Date of Birth: 1975/09/27  Transition of Care [facility]) CM/SW Contact  Eduard Roux, [place] Phone Number: 10/13/2023, 12:42 PM  Clinical Narrative:     TOC continues to follow  TOC will  seek short term rehab at SNF once cortrak is out.  Antony Blackbird, MSW, LCSW Clinical Social Worker    Expected Discharge Plan: Skilled Nursing Facility Barriers to Discharge: Continued Medical Work up  Expected Discharge Plan and Services       Living arrangements for the past 2 months: Single Family Home                                       Social Determinants of Health (SDOH) Interventions SDOH Screenings   Food Insecurity: Patient Unable To Answer (10/05/2023)  Housing: Patient Unable To Answer (10/05/2023)  Transportation Needs: Patient Unable To Answer (10/05/2023)  Utilities: Patient Unable To Answer (10/05/2023)  Tobacco Use: High Risk (08/23/2023)    Readmission Risk Interventions    08/19/2023   10:48 AM  Readmission Risk Prevention Plan  Transportation Screening Complete  PCP or Specialist Appt within 3-5 Days Not Complete  HRI or Home Care Consult Complete  Social Work Consult for Recovery Care Planning/Counseling Complete  Palliative Care Screening Not Applicable  Medication Review Oceanographer) Complete

## 2023-10-13 NOTE — Progress Notes (Signed)
 Physical Therapy Treatment Patient Details Name: JAKIAH GOREE MRN: [id number] DOB: 14-Nov-1975 Today's Date: 10/13/2023   History of Present Illness Pt is a 48 year old male admitted 2/27 for ETOH withdrawal and seizures. PMH: chronic inflammatory demyelinating polyneuropathy, alcohol use disorder, seizures, hyponatremia    PT Comments  Patient agreeable to participate with therapy today. Session began with toileting, as the patient was already seen sitting EOB upon entry. Under supervision, patient able to perform a sit > stand from bed and toilet. Ambulated 44ft in the unit with CGA to ensure safety. Therapy will further assess higher level cognition with patient during next session after the patient's sister reports confusion following today's session.    If plan is discharge home, recommend the following: Assistance with cooking/housework;Direct supervision/assist for medications management;Direct supervision/assist for financial management;Assist for transportation;Help with stairs or ramp for entrance;A little help with walking and/or transfers;A little help with bathing/dressing/bathroom   Can travel by private vehicle     No  Equipment Recommendations  None recommended by PT    Recommendations for Other Services       Precautions / Restrictions Precautions Precautions: Fall Recall of Precautions/Restrictions: Intact Restrictions Weight Bearing Restrictions Per Provider Order: No     Mobility  Bed Mobility Overal bed mobility: Needs Assistance Bed Mobility: Supine to Sit, Sit to Supine     Supine to sit: Supervision Sit to supine: Supervision   General bed mobility comments: Able to manuever in and out of bed on his own, cues for safety and positioning    Transfers Overall transfer level: Needs assistance Equipment used: Rolling walker (2 wheels) Transfers: Sit to/from Stand Sit to Stand: Contact guard assist           General transfer comment: CGA  to ensure safety    Ambulation/Gait Ambulation/Gait assistance: Contact guard assist Gait Distance (Feet): 70 Feet Assistive device: Rolling walker (2 wheels) Gait Pattern/deviations: Step-through pattern Gait velocity: Decreased         Stairs             Wheelchair Mobility     Tilt Bed    Modified Rankin (Stroke Patients Only)       Balance Overall balance assessment: Needs assistance Sitting-balance support: Bilateral upper extremity supported, Feet supported Sitting balance-Leahy Scale: Good     Standing balance support: During functional activity, Reliant on assistive device for balance Standing balance-Leahy Scale: Fair                              Hotel manager: No apparent difficulties  Cognition Arousal: Alert Behavior During Therapy: WFL for tasks assessed/performed                           PT - Cognition Comments: Patient's sister reports some confusion following therapy session, will assess cogntion further during next session Following commands: Intact      Cueing Cueing Techniques: Verbal cues, Gestural cues, Tactile cues  Exercises      General Comments General comments (skin integrity, edema, etc.): Multiple open wounds and abrasions LE. Pt reports that he picks them      Pertinent Vitals/Pain Pain Assessment Pain Assessment: Faces Faces Pain Scale: Hurts a little bit Pain Location: Bilateral legs Pain Descriptors / Indicators: Other (Comment) (Weak) Pain Intervention(s): Limited activity within patient's tolerance, Monitored during session, Repositioned    Home Living  Prior Function            PT Goals (current goals can now be found in the care plan section) Acute Rehab PT Goals Patient Stated Goal: get better PT Goal Formulation: With patient Time For Goal Achievement: 10/18/23 Potential to Achieve Goals: Good Progress towards PT  goals: Progressing toward goals    Frequency    Min 2X/week      PT Plan      Co-evaluation              AM-PAC PT "6 Clicks" Mobility   Outcome Measure  Help needed turning from your back to your side while in a flat bed without using bedrails?: A Little Help needed moving from lying on your back to sitting on the side of a flat bed without using bedrails?: A Little Help needed moving to and from a bed to a chair (including a wheelchair)?: A Little Help needed standing up from a chair using your arms (e.g., wheelchair or bedside chair)?: A Little Help needed to walk in hospital room?: A Little Help needed climbing 3-5 steps with a railing? : A Lot 6 Click Score: 17    End of Session Equipment Utilized During Treatment: Gait belt Activity Tolerance: No increased pain;Patient limited by fatigue Patient left: in bed;with call bell/phone within reach;with bed alarm set;Other (comment) (Tech was in there preparing to draw blood) Nurse Communication: Mobility status PT Visit Diagnosis: Unsteadiness on feet (R26.81);Other abnormalities of gait and mobility (R26.89);Muscle weakness (generalized) (M62.81);Difficulty in walking, not elsewhere classified (R26.2);Pain Pain - Right/Left: Right Pain - part of body: Leg;Knee     Time: 1036-1100 PT Time Calculation (min) (ACUTE ONLY): 24 min  Charges:    $Therapeutic Activity: 23-37 mins PT General Charges $$ ACUTE PT VISIT: 1 Visit                     Doreen Beam, SPT    Kal Chait 10/13/2023, 1:44 PM

## 2023-10-13 NOTE — Progress Notes (Signed)
 [facility] Progress Note Patient: Connor Copeland ZOX:[id number] DOB: 1975/11/13 DOA: 10/02/2023  DOS: the patient was seen and examined on 10/13/2023  Brief Hospital Course: PMH of CIDP, alcohol abuse, HTN, type II DM, mood disorder, presented to the hospital with complaints of seizures and any pain. Was transferred to [facility] under ICU care. Also found to have multiple wounds, orthopedic was consulted for the same.  Had a Cortrak for nutrition as was not safe to swallow. Currently mentation improving.  Assessment and Plan: Alcohol withdrawal seizure Last drink was 4 days prior to admission. Has completed his phenobarbital taper. Continue Ativan as needed. Continue thiamine and folate. PT OT evaluated the patient, will need skilled nursing facility. Continue seizure precautions.   Aspiration pneumonia: Treated with IV Unasyn. Blood cultures negative. No respiratory distress right now. Was Augmentin.  Last day 3/8.  Left upper extremity wounds present on admission: Wound care was consulted. Orthopedic was also consulted. Orthopedic recommended MRI. Currently receiving wound care. Unable to complete MRI. CT ordered.  Still not read.  I have called on 3/10 at 11 AM, per radiology there is some technical issue with images themselves.  Will follow-up on the recommendation. Initially was on vancomycin.  MRSA PCR negative blood culture negative was on doxycycline.  Currently antibiotics course is completed.   High anion gap metabolic acidosis/lactic acidosis: Likely due to seizure plus or minus starvation ketosis activity now clear with conservative management.   Transaminitis Hypoalbuminemia. Hyperbilirubinemia. LFT elevation cause, possible alcohol hepatitis versus hemodynamic injury. Improving on recheck. Hypoalbuminemia likely the cause of his third spacing as well as edema.  Self diuresing.   Acute kidney injury: Baseline creatinine 0.7.  On admission creatinine 1.26.   Treated with IV fluid. Hemodynamically mediated.   Hypokalemia. Hyponatremia. Hypocalcemia. Monitor. Replace as needed.   Incidental bilateral hygromas: Suspect that he may have a history of recurrent fall in the setting of alcohol abuse. For now monitor.   Type 2 diabetes mellitus, uncontrolled with hyper and hypoglycemia without long-term insulin use without complication. At home he was using metformin and linagliptin. Continue to hold oral hypoglycemic agent. Continue sliding scale insulin.   Adult failure to thrive In the setting of alcohol use disorder. Was receiving tube feeds via core track. Monitor for improvement in oral intake.   CIDP: Continue Cymbalta.   Obesity Class 1 Body mass index is 38.41 kg/m.  Placing the pt at higher risk of poor outcomes.  Mood disorder. Home regimen includes Cymbalta at 120 mg, Pamelor at 50 mg, Trileptal 300-600 mg. Will resume.  HTN. Was on amlodipine as well as Cardizem both. Currently blood pressure is good enough and we will continue to hold both medications.  If needed should only be on 1 calcium channel blocker.  Hypoalbuminemia and third spacing. Continue with IV Lasix for now.  Hypokalemia Hypomagnesemia.   Replacing.   Subjective: Continues to report shoulder pain.  No nausea no vomiting no fever no chills.  Orientation has improved.  Physical Exam: General: in Mild distress, improving ulcers, no new rash Cardiovascular: S1 and S2 Present, No Murmur Respiratory: Good respiratory effort, Bilateral Air entry present. No Crackles, No wheezes Abdomen: Bowel Sound present, No tenderness Extremities: Improving bilateral edema Neuro: Alert and oriented x3, no new focal deficit  Data Reviewed: I have Reviewed nursing notes, Vitals, and Lab results. Since last encounter, pertinent lab results CBC and CMP   . I have ordered test including CBC and CMP  .  Called radiology.  Disposition: Status is: Inpatient Remains  inpatient appropriate because: Needing diuresis  enoxaparin (LOVENOX) injection 40 mg Start: 10/10/23 1000 SCDs Start: 10/02/23 1700   Family Communication: No one at bedside Level of care: Progressive   Vitals:   10/12/23 2319 10/13/23 0347 10/13/23 0354 10/13/23 0743  BP: 107/73   107/77  Pulse:  (P) 100  (!) 104  Resp: 20 (P) 20  20  Temp: 98.4 F (36.9 C) (P) 97.9 F (36.6 C)  97.7 F (36.5 C)  TempSrc: Oral (P) Tympanic  Oral  SpO2:    98%  Weight:   116.1 kg   Height:         Author: Lynden Oxford, MD 10/13/2023 11:04 AM  Please look on [email or web address] to find out who is on call.

## 2023-10-13 NOTE — Plan of Care (Signed)

## 2023-10-14 ENCOUNTER — Other Ambulatory Visit: Payer: Self-pay

## 2023-10-14 ENCOUNTER — Encounter (HOSPITAL_COMMUNITY): Payer: Self-pay | Admitting: Pulmonary Disease

## 2023-10-14 DIAGNOSIS — R569 Unspecified convulsions: Secondary | ICD-10-CM | POA: Diagnosis not present

## 2023-10-14 DIAGNOSIS — F10931 Alcohol use, unspecified with withdrawal delirium: Secondary | ICD-10-CM | POA: Diagnosis not present

## 2023-10-14 LAB — BASIC METABOLIC PANEL
Anion gap: 9 (ref 5–15)
BUN: 11 mg/dL (ref 6–20)
CO2: 26 mmol/L (ref 22–32)
Calcium: 7.5 mg/dL — ABNORMAL LOW (ref 8.9–10.3)
Chloride: 97 mmol/L — ABNORMAL LOW (ref 98–111)
Creatinine, Ser: 0.73 mg/dL (ref 0.61–1.24)
GFR, Estimated: >60 mL/min (ref >60)
Glucose, Bld: 141 mg/dL — ABNORMAL HIGH (ref 70–99)
Potassium: 3.4 mmol/L — ABNORMAL LOW (ref 3.5–5.1)
Sodium: 132 mmol/L — ABNORMAL LOW (ref 135–145)

## 2023-10-14 LAB — CBC
HCT: 30.6 % — ABNORMAL LOW (ref 39.0–52.0)
Hemoglobin: 9.9 g/dL — ABNORMAL LOW (ref 13.0–17.0)
MCH: 32.5 pg (ref 26.0–34.0)
MCHC: 32.4 g/dL (ref 30.0–36.0)
MCV: 100.3 fL — ABNORMAL HIGH (ref 80.0–100.0)
Platelets: 507 10*3/uL — ABNORMAL HIGH (ref 150–400)
RBC: 3.05 MIL/uL — ABNORMAL LOW (ref 4.22–5.81)
RDW: 18.4 % — ABNORMAL HIGH (ref 11.5–15.5)
WBC: 6.9 10*3/uL (ref 4.0–10.5)
nRBC: 0 % (ref 0.0–0.2)

## 2023-10-14 LAB — GLUCOSE, CAPILLARY
Glucose-Capillary: 99 mg/dL (ref 70–99)
Glucose-Capillary: 144 mg/dL — ABNORMAL HIGH (ref 70–99)
Glucose-Capillary: 105 mg/dL — ABNORMAL HIGH (ref 70–99)

## 2023-10-14 LAB — MAGNESIUM
Magnesium: 1.4 mg/dL — ABNORMAL LOW (ref 1.7–2.4)
Magnesium: 2 mg/dL (ref 1.7–2.4)

## 2023-10-14 LAB — COMPREHENSIVE METABOLIC PANEL
ALT: 61 U/L — ABNORMAL HIGH (ref 0–44)
AST: 97 U/L — ABNORMAL HIGH (ref 15–41)
Albumin: 1.8 g/dL — ABNORMAL LOW (ref 3.5–5.0)
Alkaline Phosphatase: 324 U/L — ABNORMAL HIGH (ref 38–126)
Anion gap: 12 (ref 5–15)
BUN: 10 mg/dL (ref 6–20)
CO2: 24 mmol/L (ref 22–32)
Calcium: 7.6 mg/dL — ABNORMAL LOW (ref 8.9–10.3)
Chloride: 99 mmol/L (ref 98–111)
Creatinine, Ser: 0.62 mg/dL (ref 0.61–1.24)
GFR, Estimated: >60 mL/min (ref >60)
Glucose, Bld: 82 mg/dL (ref 70–99)
Potassium: 2.8 mmol/L — ABNORMAL LOW (ref 3.5–5.1)
Sodium: 135 mmol/L (ref 135–145)
Total Bilirubin: 1.8 mg/dL — ABNORMAL HIGH (ref 0.0–1.2)
Total Protein: 4.8 g/dL — ABNORMAL LOW (ref 6.5–8.1)

## 2023-10-14 MED ORDER — LIDOCAINE 5 % EX PTCH
2.0000 | MEDICATED_PATCH | CUTANEOUS | Status: DC
  Administered 2023-10-15 – 2023-10-17 (×3): 2 via TRANSDERMAL
  Filled 2023-10-14 (×3): qty 2

## 2023-10-14 MED ORDER — MAGNESIUM SULFATE 4 GM/100ML IV SOLN
4.0000 g | Freq: Once | INTRAVENOUS | Status: AC
  Administered 2023-10-14: 4 g via INTRAVENOUS
  Filled 2023-10-14: qty 100

## 2023-10-14 MED ORDER — HYDROCODONE-ACETAMINOPHEN 7.5-325 MG PO TABS
1.0000 | ORAL_TABLET | Freq: Four times a day (QID) | ORAL | Status: DC | PRN
  Administered 2023-10-14 – 2023-10-17 (×11): 1 via ORAL
  Filled 2023-10-14 (×11): qty 1

## 2023-10-14 MED ORDER — BENZONATATE 100 MG PO CAPS
100.0000 mg | ORAL_CAPSULE | Freq: Three times a day (TID) | ORAL | Status: DC
  Administered 2023-10-14 – 2023-10-17 (×9): 100 mg via ORAL
  Filled 2023-10-14 (×11): qty 1

## 2023-10-14 MED ORDER — POTASSIUM CHLORIDE CRYS ER 20 MEQ PO TBCR
40.0000 meq | EXTENDED_RELEASE_TABLET | Freq: Once | ORAL | Status: AC
  Administered 2023-10-14: 40 meq via ORAL
  Filled 2023-10-14: qty 2

## 2023-10-14 MED ORDER — POTASSIUM CHLORIDE CRYS ER 20 MEQ PO TBCR
40.0000 meq | EXTENDED_RELEASE_TABLET | ORAL | Status: AC
  Administered 2023-10-14 (×2): 40 meq via ORAL
  Filled 2023-10-14 (×2): qty 2

## 2023-10-14 MED ORDER — PNEUMOCOCCAL 20-VAL CONJ VACC 0.5 ML IM SUSY: 0.5000 mL | PREFILLED_SYRINGE | INTRAMUSCULAR | Status: DC | PRN

## 2023-10-14 MED ORDER — FUROSEMIDE 10 MG/ML IJ SOLN
60.0000 mg | Freq: Four times a day (QID) | INTRAMUSCULAR | Status: AC
  Administered 2023-10-14 (×2): 60 mg via INTRAVENOUS
  Filled 2023-10-14 (×2): qty 6

## 2023-10-14 NOTE — Evaluation (Signed)
 Speech Language Pathology Evaluation Patient Details Name: Connor Copeland MRN: [id number] DOB: 1976-07-29 Today's Date: 10/14/2023 Time: 1200-1230 SLP Time Calculation (min) (ACUTE ONLY): 30 min  Problem List:  Patient Active Problem List   Diagnosis Date Noted   Open wound of left upper extremity 10/11/2023   Aspiration pneumonia (HCC) 10/11/2023   Elevated LFTs 10/11/2023   Obesity (BMI 30-39.9) 10/11/2023   Subdural hygroma 10/11/2023   Hypokalemia 10/06/2023   Lactic acidosis 10/06/2023   Acute kidney injury (HCC) 10/06/2023   High anion gap metabolic acidosis 10/06/2023   SVT (supraventricular tachycardia) (HCC) 08/13/2023   Severe alcohol withdrawal with perceptual disturbances (HCC) 08/09/2023   Hyperglycemia 08/08/2023   Gait disturbance 08/04/2023   Dysesthesia 08/04/2023   Seizure disorder (HCC) 03/19/2023   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   Neuropathic pain 03/19/2023   Alcohol withdrawal seizure with delirium (HCC) 01/28/2023   Alcohol abuse 01/28/2023   Tobacco use disorder 01/28/2023   Hyponatremia 01/28/2023   Elevated MCV 01/28/2023   Macrocytic anemia 01/28/2023   Elevated transaminase level 01/28/2023   Gout 01/28/2023   HLD (hyperlipidemia) 12/11/2022   Hypertriglyceridemia 12/11/2022   Peripheral neuropathy 12/11/2022   Hypoalbuminemia 12/11/2022   Past Medical History:  Past Medical History:  Diagnosis Date   CIDP (chronic inflammatory demyelinating polyneuropathy) (HCC)    ETOH abuse    Gout    Past Surgical History:  Past Surgical History:  Procedure Laterality Date   FOOT SURGERY     HPI:  48 year old male with past medical history of chronic inflammatory demyelinating polyneuropathy, alcohol use disorder, seizures, hyponatremia who presented to [facility] ED on 2/27 with altered mental status. Reported last drink 3-4 days ago. Becoming more aggressive, multiple falls being treated for ETOH  withdrawal. Pt s/p cortrak 2/28 due to AMS.   Assessment / Plan / Recommendation Clinical Impression  Pt is much more alert and interactive than he has been earlier this admission, even making jokes. He is disoriented to time but remembered to use his calendar later when asked by OT. He has reduced recall of recent events (even from earlier today) and becomes quite tangential in conversation. He shows some appropriate awareness when talking about discharge planning, talking about modifications he wants to make to his home to try to reduce his risk of falling. He still has some reduced online awareness and problem solving when navigating his room (working around his IV) and says that he will get up (without waiting for assistance) to get to the bathroom. Hopeful that pt's mentation will continue to clear, but family is not present to confirm baseline level of function at this time. Will follow at least while inpatient and anticipate that he will need some increased support from family upon discharge. Could consider post-acute SLP f/u if not back to baseline.    SLP Assessment  SLP Recommendation/Assessment: Patient needs continued Speech Lanaguage Pathology Services SLP Visit Diagnosis: Cognitive communication deficit (R41.841)    Recommendations for follow up therapy are one component of a multi-disciplinary discharge planning process, led by the attending physician.  Recommendations may be updated based on patient status, additional functional criteria and insurance authorization.    Follow Up Recommendations  Home health SLP    Assistance Recommended at Discharge  Frequent or constant Supervision/Assistance  Functional Status Assessment Patient has had a recent decline in their functional status and demonstrates the ability to make significant improvements in function in a reasonable and predictable amount of time.  Frequency and  Duration min 2x/week  2 weeks      SLP Evaluation Cognition   Overall Cognitive Status: No family/caregiver present to determine baseline cognitive functioning Arousal/Alertness: Awake/alert Orientation Level: Oriented to person;Oriented to place;Disoriented to time;Oriented to situation Attention: Selective Selective Attention: Impaired Selective Attention Impairment: Verbal complex Memory: Impaired Memory Impairment: Decreased recall of new information Awareness: Impaired Awareness Impairment: Other (comment) (online) Problem Solving: Impaired Problem Solving Impairment: Functional complex Safety/Judgment: Impaired       Comprehension  Auditory Comprehension Overall Auditory Comprehension: Appears within functional limits for tasks assessed    Expression Expression Primary Mode of Expression: Verbal Verbal Expression Overall Verbal Expression: Appears within functional limits for tasks assessed   Oral / Motor  Motor Speech Overall Motor Speech: Appears within functional limits for tasks assessed            Mahala Menghini., M.A. CCC-SLP Acute Rehabilitation Services Office [phone]  Secure chat preferred  10/14/2023, 2:19 PM

## 2023-10-14 NOTE — Progress Notes (Signed)
 Occupational Therapy Treatment Patient Details Name: Connor Copeland MRN: [id number] DOB: 02-24-1976 Today's Date: 10/14/2023   History of present illness 48 yo male admitted 2/27 for ETOH withdrawal and seizures. PMH: chronic inflammatory demyelinating polyneuropathy, alcohol use disorder, seizures, hyponatremia   OT comments  Pt demonstrates ability to complete self care, feeding with red foam handles fork, basic transfer and bed mobility. OT has updated recommendations for home based on progress during session and meeting goals. Pt reports that his mother leaves near by and can help with any care needed. Recommendation for [facility] and will reevaluate next session for Outpatient readiness.       If plan is discharge home, recommend the following:  A little help with bathing/dressing/bathroom   Equipment Recommendations  BSC/3in1    Recommendations for Other Services      Precautions / Restrictions Precautions Precautions: Fall Recall of Precautions/Restrictions: Intact       Mobility Bed Mobility Overal bed mobility: Modified Independent                  Transfers Overall transfer level: Needs assistance   Transfers: Sit to/from Stand Sit to Stand: Supervision                 Balance Overall balance assessment: Mild deficits observed, not formally tested                             High Level Balance Comments: pt needed cues during session to avoid becoming tangled in IV cord. pt was unaware of the safety of the IV           ADL either performed or assessed with clinical judgement   ADL Overall ADL's : Needs assistance/impaired Eating/Feeding: Set up;Sitting;With adaptive utensils (add red foam. pt was able to manipulate the utensil)   Grooming: Modified independent               Lower Body Dressing: Minimal assistance   Toilet Transfer: Supervision/safety           Functional mobility during ADLs: Supervision/safety       Extremity/Trunk Assessment Upper Extremity Assessment Upper Extremity Assessment: Right hand dominant;RUE deficits/detail RUE Deficits / Details: AROM 80 degrees, reports hand is numb and only have tactile input caused "tingling" RUE Sensation: decreased light touch RUE Coordination: decreased fine motor   Lower Extremity Assessment Lower Extremity Assessment: Defer to PT evaluation        Vision       Perception     Praxis     Communication Communication Communication: No apparent difficulties   Cognition Arousal: Alert Behavior During Therapy: WFL for tasks assessed/performed Cognition: No family/caregiver present to determine baseline   Orientation impairments: Time (pt unaware of admission date but was able to determine he had been admitted 12 days when informed)     Attention impairment (select first level of impairment): Alternating attention   OT - Cognition Comments: pt able to manage multiple commands. pt calling mother about food and ordering food.                 Following commands: Intact        Cueing      Exercises      Shoulder Instructions       General Comments VSS on RA    Pertinent Vitals/ Pain       Pain Assessment Pain Assessment: Faces Faces Pain Scale: Hurts even  more Pain Location: generalized- does verbalize R shoulder pain Pain Descriptors / Indicators: Grimacing Pain Intervention(s): Monitored during session, Repositioned, Limited activity within patient's tolerance  Home Living Family/patient expects to be discharged to:: Private residence Living Arrangements: Alone Available Help at Discharge: Family;Available PRN/intermittently Type of Home: House                              Lives With: Alone    Prior Functioning/Environment              Frequency  Min 2X/week        Progress Toward Goals  OT Goals(current goals can now be found in the care plan section)  Progress towards OT  goals: Progressing toward goals  Acute Rehab OT Goals Patient Stated Goal: to get chickfila sandwich OT Goal Formulation: With patient Time For Goal Achievement: 10/28/23 Potential to Achieve Goals: Good ADL Goals Pt Will Perform Eating: with modified independence;with adaptive utensils;sitting Pt Will Perform Grooming:  (met) Pt Will Perform Upper Body Dressing:  (demonstrates adequate level) Pt Will Transfer to Toilet: with supervision;ambulating;regular height toilet Pt/caregiver will Perform Home Exercise Program: Increased ROM;Increased strength;Both right and left upper extremity;With minimal assist;With written HEP provided Additional ADL Goal #1: Pt will complete fine motor HEP for R hand  Plan      Co-evaluation      Reason for Co-Treatment: For patient/therapist safety;To address functional/ADL transfers     SLP goals addressed during session: Swallowing;Cognition;Communication    AM-PAC OT "6 Clicks" Daily Activity     Outcome Measure   Help from another person eating meals?: A Little Help from another person taking care of personal grooming?: A Little Help from another person toileting, which includes using toliet, bedpan, or urinal?: A Little Help from another person bathing (including washing, rinsing, drying)?: A Little Help from another person to put on and taking off regular upper body clothing?: A Little Help from another person to put on and taking off regular lower body clothing?: A Little 6 Click Score: 18    End of Session    OT Visit Diagnosis: Unsteadiness on feet (R26.81);Muscle weakness (generalized) (M62.81)   Activity Tolerance Patient tolerated treatment well   Patient Left with call bell/phone within reach;in bed;with bed alarm set   Nurse Communication Mobility status;Precautions        Time: 6578-4696 OT Time Calculation (min): 48 min  Charges: OT General Charges $OT Visit: 1 Visit OT Treatments $Self Care/Home Management :  38-52 mins   Brynn, OTR/L  Acute Rehabilitation Services Office: [phone] .   Mateo Flow 10/14/2023, 2:21 PM

## 2023-10-14 NOTE — Progress Notes (Signed)
 [facility] Progress Note Patient: Connor Copeland:[id number] DOB: [birth year] DOA: 10/02/2023  DOS: the patient was seen and examined on 10/14/2023  Brief Hospital Course: PMH of CIDP, alcohol abuse, HTN, type II DM, mood disorder, presented to the hospital with complaints of seizures and any pain. Was transferred to [facility] under ICU care. Also found to have multiple wounds, orthopedic was consulted for the same.  Had a Cortrak for nutrition as was not safe to swallow. Currently mentation improving.  Needing diuresis.  Assessment and Plan: Alcohol withdrawal seizure Last drink was 4 days prior to admission. Has completed his phenobarbital taper. Continue Ativan as needed. Continue thiamine and folate. PT OT evaluated the patient, will need skilled nursing facility. Continue seizure precautions.   Aspiration pneumonia: Treated with IV Unasyn. Blood cultures negative. No respiratory distress right now. Was Augmentin.  Last day 3/8.  Left upper extremity wounds present on admission: Wound care was consulted. Orthopedic was also consulted. Orthopedic recommended MRI. Currently receiving wound care. Unable to complete MRI. CT was performed.  No evidence of abscess or internal injury. No further workup necessary. Initially was on vancomycin.  MRSA PCR negative blood culture negative was on doxycycline.  Currently antibiotics course is completed.  Acute diastolic CHF. EF 65%. Grade 1 diastolic dysfunction. No significant valve abnormality. Most likely this is third spacing in the setting of hypoalbuminemia. Will treat with IV Lasix.  High anion gap metabolic acidosis/lactic acidosis: Likely due to seizure plus or minus starvation ketosis activity now.  Clear with conservative management.   Transaminitis Hypoalbuminemia. Hyperbilirubinemia. LFT elevation cause, possible alcohol hepatitis versus hemodynamic injury. Improving on recheck. Hypoalbuminemia likely the  cause of his third spacing as well as edema.  Self diuresing.   Acute kidney injury: Baseline creatinine 0.7.  On admission creatinine 1.26.  Treated with IV fluid. Hemodynamically mediated.   Hypokalemia. Hyponatremia. Hypocalcemia. Monitor. Replace as needed.   Incidental bilateral hygromas: Suspect that he may have a history of recurrent fall in the setting of alcohol abuse. For now monitor.   Type 2 diabetes mellitus, uncontrolled with hyper and hypoglycemia without long-term insulin use without complication. At home he was using metformin and linagliptin. Continue to hold oral hypoglycemic agent. Continue sliding scale insulin.   Adult failure to thrive In the setting of alcohol use disorder. Was receiving tube feeds via core track. Monitor for improvement in oral intake.   CIDP: Continue Cymbalta.   Obesity Class 1 Body mass index is 38.41 kg/m.  Placing the pt at higher risk of poor outcomes.  Mood disorder. Home regimen includes Cymbalta at 120 mg, Pamelor at 50 mg, Trileptal 300-600 mg. Will resume.  HTN. Was on amlodipine as well as Cardizem both. Currently blood pressure is good enough and we will continue to hold both medications.  If needed should only be on 1 calcium channel blocker.  Hypokalemia Hypomagnesemia.   Replacing.   Subjective: No nausea no vomiting.  Continues to complain of right shoulder pain.  No fever no chills.  Physical Exam: General: in Mild distress, No Rash Cardiovascular: S1 and S2 Present, No Murmur Respiratory: Good respiratory effort, Bilateral Air entry present. No Crackles, No wheezes Abdomen: Bowel Sound present, No tenderness Extremities: Improving approximately edema, persistent significant bilateral lower extremity edema Neuro: Alert and oriented x3, no new focal deficit  Data Reviewed: I have Reviewed nursing notes, Vitals, and Lab results. Since last encounter, pertinent lab results CBC and BMP   . I have ordered  test including  CBC and BMP  .   Disposition: Status is: Inpatient Remains inpatient appropriate because: Monitor for improvement in volume overload  enoxaparin (LOVENOX) injection 40 mg Start: 10/10/23 1000 SCDs Start: 10/02/23 1700   Family Communication: No family at bedside Level of care: Progressive   Vitals:   10/14/23 0735 10/14/23 0824 10/14/23 1100 10/14/23 1546  BP:  123/81 116/80 119/77  Pulse:  (!) 105 (!) 111 (!) 103  Resp:  16  16  Temp:  98.1 F (36.7 C) 98 F (36.7 C) 98.3 F (36.8 C)  TempSrc:  Oral Oral Oral  SpO2:  97% 98% 98%  Weight: 115.7 kg     Height:         Author: Lynden Oxford, MD 10/14/2023 5:27 PM  Please look on [email or web address] to find out who is on call.

## 2023-10-14 NOTE — Progress Notes (Signed)
 Speech Language Pathology Treatment: Dysphagia  Patient Details Name: Connor Copeland MRN: [id number] DOB: 1976/02/27 Today's Date: 10/14/2023 Time: 1131-1200 SLP Time Calculation (min) (ACUTE ONLY): 29 min  Assessment / Plan / Recommendation Clinical Impression  Pt was seen during his lunch meal, although not wanting to eat much of it. His nurse says he doesn't like the soft textures and pt adds that he doesn't like the taste. He is wanting family to bring him food that is not on a mechanical soft diet. He has no overt signs of dysphagia today while eating soft solids or drinking thin liquids. Oral phase is functional and swift, and he has no more throat clearing. Recommend that he advance to regular solids. SLP will f/u briefly after advancement but do not anticipate that he will have ongoing dysphagia needs.    HPI HPI: 48 year old male with past medical history of chronic inflammatory demyelinating polyneuropathy, alcohol use disorder, seizures, hyponatremia who presented to [facility] ED on 2/27 with altered mental status. Reported last drink 3-4 days ago. Becoming more aggressive, multiple falls being treated for ETOH withdrawal. Pt s/p cortrak 2/28 due to AMS.      SLP Plan  Continue with current plan of care      Recommendations for follow up therapy are one component of a multi-disciplinary discharge planning process, led by the attending physician.  Recommendations may be updated based on patient status, additional functional criteria and insurance authorization.    Recommendations  Diet recommendations: Regular;Thin liquid Liquids provided via: Cup;Straw Medication Administration: Whole meds with liquid Supervision: Full supervision/cueing for compensatory strategies;Staff to assist with self feeding Compensations: Minimize environmental distractions;Slow rate;Small sips/bites Postural Changes and/or Swallow Maneuvers: Seated upright 90 degrees;Upright 30-60 min after meal                   Oral care BID   Frequent or constant Supervision/Assistance Dysphagia, unspecified (R13.10)     Continue with current plan of care     Mahala Menghini., M.A. CCC-SLP Acute Rehabilitation Services Office [phone]  Secure chat preferred   10/14/2023, 1:58 PM

## 2023-10-14 NOTE — Plan of Care (Signed)
  Problem: Coping: Goal: Ability to adjust to condition or change in health will improve Outcome: Progressing   Problem: Fluid Volume: Goal: Ability to maintain a balanced intake and output will improve Outcome: Progressing   Problem: Health Behavior/Discharge Planning: Goal: Ability to identify and utilize available resources and services will improve Outcome: Progressing Goal: Ability to manage health-related needs will improve Outcome: Progressing

## 2023-10-14 NOTE — Plan of Care (Signed)
  Problem: Coping: Goal: Ability to adjust to condition or change in health will improve Outcome: Progressing   Problem: Education: Goal: Knowledge of General Education information will improve Description: Including pain rating scale, medication(s)/side effects and non-pharmacologic comfort measures Outcome: Progressing   Problem: Health Behavior/Discharge Planning: Goal: Ability to manage health-related needs will improve Outcome: Progressing   Problem: Clinical Measurements: Goal: Will remain free from infection Outcome: Progressing   Problem: Activity: Goal: Risk for activity intolerance will decrease Outcome: Progressing   Problem: Coping: Goal: Level of anxiety will decrease Outcome: Progressing   Problem: Elimination: Goal: Will not experience complications related to bowel motility Outcome: Progressing Goal: Will not experience complications related to urinary retention Outcome: Progressing

## 2023-10-15 DIAGNOSIS — G9608 Other cranial cerebrospinal fluid leak: Secondary | ICD-10-CM | POA: Diagnosis not present

## 2023-10-15 DIAGNOSIS — F10931 Alcohol use, unspecified with withdrawal delirium: Secondary | ICD-10-CM | POA: Diagnosis not present

## 2023-10-15 DIAGNOSIS — R569 Unspecified convulsions: Secondary | ICD-10-CM | POA: Diagnosis not present

## 2023-10-15 DIAGNOSIS — E871 Hypo-osmolality and hyponatremia: Secondary | ICD-10-CM | POA: Diagnosis not present

## 2023-10-15 LAB — COMPREHENSIVE METABOLIC PANEL
ALT: 58 U/L — ABNORMAL HIGH (ref 0–44)
AST: 92 U/L — ABNORMAL HIGH (ref 15–41)
Albumin: 1.9 g/dL — ABNORMAL LOW (ref 3.5–5.0)
Alkaline Phosphatase: 332 U/L — ABNORMAL HIGH (ref 38–126)
Anion gap: 13 (ref 5–15)
BUN: 10 mg/dL (ref 6–20)
CO2: 25 mmol/L (ref 22–32)
Calcium: 7.6 mg/dL — ABNORMAL LOW (ref 8.9–10.3)
Chloride: 94 mmol/L — ABNORMAL LOW (ref 98–111)
Creatinine, Ser: 0.81 mg/dL (ref 0.61–1.24)
GFR, Estimated: >60 mL/min (ref >60)
Glucose, Bld: 117 mg/dL — ABNORMAL HIGH (ref 70–99)
Potassium: 2.9 mmol/L — ABNORMAL LOW (ref 3.5–5.1)
Sodium: 132 mmol/L — ABNORMAL LOW (ref 135–145)
Total Bilirubin: 1.6 mg/dL — ABNORMAL HIGH (ref 0.0–1.2)
Total Protein: 4.8 g/dL — ABNORMAL LOW (ref 6.5–8.1)

## 2023-10-15 LAB — CBC
HCT: 30.9 % — ABNORMAL LOW (ref 39.0–52.0)
Hemoglobin: 10.1 g/dL — ABNORMAL LOW (ref 13.0–17.0)
MCH: 32.7 pg (ref 26.0–34.0)
MCHC: 32.7 g/dL (ref 30.0–36.0)
MCV: 100 fL (ref 80.0–100.0)
Platelets: 549 10*3/uL — ABNORMAL HIGH (ref 150–400)
RBC: 3.09 MIL/uL — ABNORMAL LOW (ref 4.22–5.81)
RDW: 18.3 % — ABNORMAL HIGH (ref 11.5–15.5)
WBC: 7.2 10*3/uL (ref 4.0–10.5)
nRBC: 0 % (ref 0.0–0.2)

## 2023-10-15 LAB — GLUCOSE, CAPILLARY
Glucose-Capillary: 95 mg/dL (ref 70–99)
Glucose-Capillary: 129 mg/dL — ABNORMAL HIGH (ref 70–99)
Glucose-Capillary: 155 mg/dL — ABNORMAL HIGH (ref 70–99)
Glucose-Capillary: 105 mg/dL — ABNORMAL HIGH (ref 70–99)

## 2023-10-15 LAB — MAGNESIUM: Magnesium: 1.5 mg/dL — ABNORMAL LOW (ref 1.7–2.4)

## 2023-10-15 MED ORDER — SODIUM CHLORIDE 0.9 % IV BOLUS
500.0000 mL | Freq: Once | INTRAVENOUS | Status: AC
  Administered 2023-10-15: 500 mL via INTRAVENOUS

## 2023-10-15 MED ORDER — ALBUMIN HUMAN 25 % IV SOLN
25.0000 g | Freq: Four times a day (QID) | INTRAVENOUS | Status: AC
  Administered 2023-10-15 – 2023-10-16 (×4): 25 g via INTRAVENOUS
  Filled 2023-10-15 (×4): qty 100

## 2023-10-15 MED ORDER — MAGNESIUM OXIDE -MG SUPPLEMENT 400 (240 MG) MG PO TABS
400.0000 mg | ORAL_TABLET | Freq: Two times a day (BID) | ORAL | Status: AC
  Administered 2023-10-15 (×2): 400 mg via ORAL
  Filled 2023-10-15 (×2): qty 1

## 2023-10-15 MED ORDER — POTASSIUM CHLORIDE CRYS ER 20 MEQ PO TBCR
40.0000 meq | EXTENDED_RELEASE_TABLET | Freq: Two times a day (BID) | ORAL | Status: AC
  Administered 2023-10-15 (×2): 40 meq via ORAL
  Filled 2023-10-15 (×2): qty 2

## 2023-10-15 NOTE — Plan of Care (Signed)

## 2023-10-15 NOTE — Progress Notes (Signed)
 [facility] PROGRESS NOTE    Progress Note  ARNE SCHLENDER  ZOX:[id number] DOB: [birth year] DOA: 10/02/2023 PCP: Benita Stabile, MD     Brief Narrative:   ORIS STAFFIERI is an 48 y.o. male past medical history of chronic inflammatory demyelinating polyneuropathy, alcohol disorder seizure presents to [facility] with altered mental status, last drink 3 to 4 days prior to admission.  He did became aggressive and falling in the ED was found to have been severe alcohol withdrawal mated under PCCM started on Precedex   Assessment/Plan:   Alcohol withdrawal seizure (HCC): Last drink was 4 days prior to admission. Has completed his phenobarbital taper. Continue Ativan. Continue thiamine and folate. PT OT evaluated the patient, will need skilled nursing facility. Continue seizure precautions.  Hydro electrolytic imbalance/Hyponatremia/hypokalemia/hypocalcemia/hypomagnesemia: Suspect likely hypovolemic.  Sodium is worse this morning likely due to overdiuresis. Fluids with normal saline. Potassium is low this morning replete orally recheck in the morning. Magnesium is low this morning replete orally recheck in the morning. With good urine output urine has cleared  Left upper extremity wound present on admission: Wound care and orthopedic surgery was consulted. Orthopedic surgery recommended an MRI which was were unable to perform. CT scan showed no abscess or evidence of internal injury. Has completed his course of antibiotics.    Aspiration pneumonia: Completed his course of antibiotics in house. Has defervesced leukocytosis improved.  Chronic diastolic heart failure: With an EF of 60%.,  Satting greater than 96% on room air. He is third spacing and he was started on IV Lasix. Creatinine is rising we will give a bolus of normal saline.  High anion gap metabolic acidosis/lactic acidosis: Likely due to seizure plus or minus starvation ketosis activity now clear  with conservative management.  Transaminitis: We have stabilized CK is 105. Likely due to alcohol abuse.  Acute kidney injury: Hemodynamically mediated. Resolved with IV fluid resuscitation.  Left upper extremity wounds present on admission: Continue IV vancomycin, once able to take orals can transition to oral doxycycline  Incidental bilateral hygromas: Continue to monitor.  Diabetes mellitus type 2/hypoglycemia: At home he was using metformin and linagliptin. All Oral hypoglycemic agents were held, continue sliding scale insulin post low-dose long-acting. Currently on on tube feedings.  Severe protein caloric malnutrition/nutrition: Possible core track tolerating his diet. CIDP: Continue Cymbalta.  Left shoulder pain: Imaging show osteoarthritis continue analgesics.    DVT prophylaxis: lovenox Family Communication:none Status is: Inpatient Remains inpatient appropriate because: Acute alcohol withdrawal    Code Status:     Code Status Orders  (From admission, onward)           Start     Ordered   10/02/23 1701  Full code  Continuous       Question:  By:  Answer:  Default: patient does not have capacity for decision making, no surrogate or prior directive available   10/02/23 1704           Code Status History     Date Active Date Inactive Code Status Order ID Comments User Context   08/19/2023 1701 08/26/2023 1602 Full Code [id number]  Lynnae Prude Inpatient   08/19/2023 1701 08/19/2023 1701 Full Code [id number]  Lynnae Prude Inpatient   08/08/2023 1441 08/19/2023 1655 Full Code [id number]  Kendell Bane, MD ED   01/28/2023 1158 02/03/2023 1751 Full Code [id number]  Cleora Fleet, MD ED         IV Access:  Peripheral IV   Procedures and diagnostic studies:   No results found.    Medical Consultants:   None.   Subjective:    GAGANDEEP PETTET feels much better, relates left shoulder pain  Objective:     Vitals:   10/14/23 1907 10/14/23 2312 10/15/23 0410 10/15/23 0414  BP: 109/70 96/74 109/83   Pulse: (!) 104 (!) 118 99   Resp:   20   Temp: 98.1 F (36.7 C) 98.5 F (36.9 C) 97.9 F (36.6 C)   TempSrc: Oral Oral Oral   SpO2: 98% 98%    Weight:    114.5 kg  Height:       SpO2: 98 % O2 Flow Rate (L/min): 2 L/min   Intake/Output Summary (Last 24 hours) at 10/15/2023 0721 Last data filed at 10/14/2023 1528 Gross per 24 hour  Intake 100 ml  Output --  Net 100 ml   Filed Weights   10/13/23 0354 10/14/23 0735 10/15/23 0414  Weight: 116.1 kg 115.7 kg 114.5 kg    Exam: General exam: In no acute distress. Respiratory system: Good air movement and clear to auscultation. Cardiovascular system: S1 & S2 heard, RRR. No JVD. Gastrointestinal system: Abdomen is nondistended, soft and nontender.  Central nervous system: Awake and oriented x 3 with no focal deficits. Extremities: No pedal edema. Skin: No rashes, lesions or ulcers Psychiatry: Judgement and insight appears to be tact, mood and affect are appropriate. Data Reviewed:    Labs: Basic Metabolic Panel: Recent Labs  Lab 10/09/23 0540 10/10/23 0519 10/11/23 0514 10/12/23 0915 10/12/23 0916 10/13/23 1102 10/14/23 0607 10/14/23 1441 10/15/23 0454  NA 135 138   < >  --  135 137 135 132* 132*  K 3.5 4.1   < >  --  3.3* 3.5 2.8* 3.4* 2.9*  CL 104 108   < >  --  103 101 99 97* 94*  CO2 25 24   < >  --  24 20* 24 26 25   GLUCOSE 114* 87   < >  --  147* 104* 82 141* 117*  BUN 14 16   < >  --  15 12 10 11 10   CREATININE 0.55* 0.49*   < >  --  0.62 0.62 0.62 0.73 0.81  CALCIUM 7.4* 7.4*   < >  --  7.7* 8.1* 7.6* 7.5* 7.6*  MG 1.6* 1.7  --  1.4*  --  1.8 1.4* 2.0 1.5*  PHOS 2.7 2.9  --   --   --   --   --   --   --    < > = values in this interval not displayed.   GFR Estimated Creatinine Clearance: 135.4 mL/min (by C-G formula based on SCr of 0.81 mg/dL). Liver Function Tests: Recent Labs  Lab 10/10/23 0519  10/11/23 0514 10/12/23 0916 10/14/23 0607 10/15/23 0454  AST 79* 91* 79* 97* 92*  ALT 61* 64* 59* 61* 58*  ALKPHOS 286* 302* 314* 324* 332*  BILITOT 2.1* 2.1* 2.1* 1.8* 1.6*  PROT 4.6* 4.7* 4.5* 4.8* 4.8*  ALBUMIN 1.9* 1.8* 1.8* 1.8* 1.9*   No results for input(s): "LIPASE", "AMYLASE" in the last 168 hours. No results for input(s): "AMMONIA" in the last 168 hours.  Coagulation profile No results for input(s): "INR", "PROTIME" in the last 168 hours.  COVID-19 Labs  No results for input(s): "DDIMER", "FERRITIN", "LDH", "CRP" in the last 72 hours.  No results found for: "SARSCOV2NAA"  CBC: Recent Labs  Lab 10/10/23 0519 10/11/23 0514 10/12/23 0916 10/14/23 0607 10/15/23 0454  WBC 10.0 10.6* 8.7 6.9 7.2  HGB 9.6* 9.7* 9.5* 9.9* 10.1*  HCT 31.1* 31.0* 29.9* 30.6* 30.9*  MCV 103.0* 103.7* 101.0* 100.3* 100.0  PLT 360 394 446* 507* 549*   Cardiac Enzymes: No results for input(s): "CKTOTAL", "CKMB", "CKMBINDEX", "TROPONINI" in the last 168 hours.  BNP (last 3 results) No results for input(s): "PROBNP" in the last 8760 hours. CBG: Recent Labs  Lab 10/13/23 1728 10/13/23 2112 10/14/23 0821 10/14/23 1150 10/14/23 1543  GLUCAP 170* 143* 99 144* 105*   D-Dimer: No results for input(s): "DDIMER" in the last 72 hours. Hgb A1c: No results for input(s): "HGBA1C" in the last 72 hours. Lipid Profile: No results for input(s): "CHOL", "HDL", "LDLCALC", "TRIG", "CHOLHDL", "LDLDIRECT" in the last 72 hours. Thyroid function studies: No results for input(s): "TSH", "T4TOTAL", "T3FREE", "THYROIDAB" in the last 72 hours.  Invalid input(s): "FREET3" Anemia work up: No results for input(s): "VITAMINB12", "FOLATE", "FERRITIN", "TIBC", "IRON", "RETICCTPCT" in the last 72 hours. Sepsis Labs: Recent Labs  Lab 10/11/23 0514 10/12/23 0916 10/14/23 0607 10/15/23 0454  WBC 10.6* 8.7 6.9 7.2   Microbiology No results found for this or any previous visit (from the past 240  hours).    Medications:    benzonatate  100 mg Oral TID   DULoxetine  60 mg Oral BID   enoxaparin (LOVENOX) injection  40 mg Subcutaneous Q24H   febuxostat  40 mg Oral Daily   feeding supplement  237 mL Oral BID BM   insulin aspart  0-5 Units Subcutaneous QHS   insulin aspart  0-9 Units Subcutaneous TID WC   lactulose  20 g Oral Daily   leptospermum manuka honey  1 Application Topical Daily   lidocaine  2 patch Transdermal Q24H   nicotine  14 mg Transdermal Daily   nortriptyline  50 mg Oral QHS   nutrition supplement (JUVEN)  1 packet Oral BID BM   mouth rinse  15 mL Mouth Rinse 4 times per day   Oxcarbazepine  300 mg Oral Daily   OXcarbazepine  600 mg Oral QHS   Continuous Infusions:      LOS: 13 days   Marinda Elk  [facility]  10/15/2023, 7:21 AM

## 2023-10-15 NOTE — Progress Notes (Signed)
 Physical Therapy Treatment Patient Details Name: Connor Copeland MRN: [id number] DOB: Aug 03, 1976 Today's Date: 10/15/2023   History of Present Illness 48 yo male admitted 2/27 for ETOH withdrawal and seizures. PMH: chronic inflammatory demyelinating polyneuropathy, alcohol use disorder, seizures, hyponatremia    PT Comments  Pt progressing steadily towards his physical therapy goals, with improvements in activity tolerance and cognition. Reports increased generalized pain today with distal BLE swelling. Pt ambulating 200 ft with a walker and negotiated 1 step with a railing. Would benefit from further stair training prior to d/c home. Updated d/c plan to HHPT in light of progress.   If plan is discharge home, recommend the following: Assist for transportation;Help with stairs or ramp for entrance;Direct supervision/assist for medications management;Assistance with cooking/housework;Direct supervision/assist for financial management   Can travel by private vehicle     Yes  Equipment Recommendations  None recommended by PT    Recommendations for Other Services       Precautions / Restrictions Precautions Precautions: Fall Recall of Precautions/Restrictions: Intact Restrictions Weight Bearing Restrictions Per Provider Order: No     Mobility  Bed Mobility Overal bed mobility: Modified Independent                  Transfers Overall transfer level: Needs assistance Equipment used: Rolling walker (2 wheels) Transfers: Sit to/from Stand Sit to Stand: Supervision           General transfer comment: Good power up to stand    Ambulation/Gait Ambulation/Gait assistance: Supervision Gait Distance (Feet): 200 Feet Assistive device: Rolling walker (2 wheels) Gait Pattern/deviations: Step-through pattern, Decreased stride length           Stairs Stairs: Yes Stairs assistance: Contact guard assist Stair Management: One rail Right Number of Stairs: 1      Wheelchair Mobility     Tilt Bed    Modified Rankin (Stroke Patients Only)       Balance Overall balance assessment: Mild deficits observed, not formally tested                                          Communication Communication Communication: No apparent difficulties  Cognition Arousal: Alert Behavior During Therapy: WFL for tasks assessed/performed   PT - Cognitive impairments: Attention                       PT - Cognition Comments: Likely nearing baseline, pt able to verbalize deficits and needs. Following multi step commands. Recognizes he is weaker than his baseline Following commands: Intact      Cueing    Exercises      General Comments        Pertinent Vitals/Pain Pain Assessment Pain Assessment: Faces Faces Pain Scale: Hurts even more Pain Location: BUE/BLE Pain Descriptors / Indicators: Constant, Grimacing Pain Intervention(s): Limited activity within patient's tolerance, Monitored during session, Premedicated before session    Home Living                          Prior Function            PT Goals (current goals can now be found in the care plan section) Acute Rehab PT Goals Patient Stated Goal: less pain Potential to Achieve Goals: Good Progress towards PT goals: Progressing toward goals    Frequency  Min 2X/week      PT Plan      Co-evaluation              AM-PAC PT "6 Clicks" Mobility   Outcome Measure  Help needed turning from your back to your side while in a flat bed without using bedrails?: None Help needed moving from lying on your back to sitting on the side of a flat bed without using bedrails?: None Help needed moving to and from a bed to a chair (including a wheelchair)?: A Little Help needed standing up from a chair using your arms (e.g., wheelchair or bedside chair)?: A Little Help needed to walk in hospital room?: A Little Help needed climbing 3-5 steps with a  railing? : A Little 6 Click Score: 20    End of Session Equipment Utilized During Treatment: Gait belt Activity Tolerance: Patient tolerated treatment well Patient left: in chair;with call bell/phone within reach;with chair alarm set Nurse Communication: Mobility status PT Visit Diagnosis: Unsteadiness on feet (R26.81);Other abnormalities of gait and mobility (R26.89);Muscle weakness (generalized) (M62.81);Difficulty in walking, not elsewhere classified (R26.2);Pain Pain - Right/Left: Right Pain - part of body: Leg;Knee     Time: 1610-9604 PT Time Calculation (min) (ACUTE ONLY): 34 min  Charges:    $Therapeutic Activity: 23-37 mins PT General Charges $$ ACUTE PT VISIT: 1 Visit                     Lillia Pauls, PT, DPT Acute Rehabilitation Services Office [phone]    Norval Morton 10/15/2023, 1:09 PM

## 2023-10-15 NOTE — Progress Notes (Signed)
 Occupational Therapy Treatment Patient Details Name: Connor Copeland MRN: [id number] DOB: 1976/05/11 Today's Date: 10/15/2023   History of present illness 48 yo male admitted 2/27 for ETOH withdrawal and seizures. PMH: chronic inflammatory demyelinating polyneuropathy, alcohol use disorder, seizures, hyponatremia   OT comments  Pt is making continued progress towards acute OT goals. Pt continues to be limited by deficits listed below. Pt with improvement in mobility on this date. Overall, pt required CGA-supervision and increased time for functional mobility tasks using RW due to reports of increased pain in R shoulder and BLE. Pt provided with HEP of FM/dexterity and shoulder towel slide exercises to improve FM coordination, strength and maintain functional ROM to improve pt independence in functional tasks. Pt completed 10 reps of each exercise and encouraged to complete 3xs a day. Pt verbalized understanding. OT to continue following pt acutely to address functional needs with discharge recommendations of HH OT to maximize functional independence and to ensure safe discharge to home environment.       If plan is discharge home, recommend the following:  A little help with bathing/dressing/bathroom   Equipment Recommendations  BSC/3in1    Recommendations for Other Services Speech consult;PT consult    Precautions / Restrictions Precautions Precautions: Fall Recall of Precautions/Restrictions: Intact Restrictions Weight Bearing Restrictions Per Provider Order: No       Mobility Bed Mobility Overal bed mobility: Needs Assistance Bed Mobility: Supine to Sit, Sit to Supine     Supine to sit: Contact guard Sit to supine: Supervision   General bed mobility comments: Pt with reports of pain in RUE and BLE with movement. Pt reached for OT student hand to assist but was able to elevate trunk with CGA. Pt did not require assistance bringing BLE into bed at end of session. Verbal  cues provided for sequencing.    Transfers Overall transfer level: Needs assistance Equipment used: Rolling walker (2 wheels) Transfers: Sit to/from Stand Sit to Stand: Supervision           General transfer comment: Increased time required due to pt reports of pain in BLE and R shoulder     Balance Overall balance assessment: Mild deficits observed, not formally tested                                         ADL either performed or assessed with clinical judgement   ADL Overall ADL's : Needs assistance/impaired                                     Functional mobility during ADLs: Supervision/safety;Contact guard assist;Rolling walker (2 wheels) General ADL Comments: Pt with reports of increased pain in R shoulder, BLE and observed with increased swelling in BLE on this date. Improvement noted in mobility. Pt required increased time and verbal cues for sequencing when performing functional mobility tasks.    Extremity/Trunk Assessment Upper Extremity Assessment Upper Extremity Assessment: RUE deficits/detail RUE Deficits / Details: AROM 80 degrees, R shoulder pain from recent fall; Impaired FM coordination, slow and deliberate movements, decreased grip strength, noted weakness in intrinsic hand muscles when engaging in exercises (Simultaneous filing. User may not have seen previous data.) RUE: Shoulder pain with ROM RUE Sensation: decreased light touch RUE Coordination: decreased fine motor   Lower Extremity Assessment Lower Extremity Assessment: Defer to  PT evaluation        Vision   Vision Assessment?: No apparent visual deficits   Perception Perception Perception: Not tested   Praxis Praxis Praxis: Not tested   Communication Communication Communication: No apparent difficulties   Cognition Arousal: Alert Behavior During Therapy: WFL for tasks assessed/performed Cognition: No family/caregiver present to determine baseline                                Following commands: Intact        Cueing   Cueing Techniques: Verbal cues, Tactile cues  Exercises Exercises: Hand exercises, Other exercises Hand Exercises Digit Composite Flexion: AROM, Both, 10 reps, Seated, Other (comment) Composite Extension: AROM, Both, 10 reps, Seated Digit Composite Abduction: AROM, Both, 10 reps, Seated Digit Composite Adduction: AROM, Both, 10 reps, Seated Digit Lifts: AROM, Both, 10 reps, Seated Other Exercises Other Exercises: shoulder towel slides, 10 reps, seated EOB    Shoulder Instructions       General Comments VSS on RA    Pertinent Vitals/ Pain       Pain Assessment Pain Assessment: Faces Faces Pain Scale: Hurts even more Pain Location: R shoulder and BLE Pain Descriptors / Indicators: Aching, Grimacing, Guarding Pain Intervention(s): Premedicated before session, Limited activity within patient's tolerance, Heat applied  Home Living                                          Prior Functioning/Environment              Frequency  Min 2X/week        Progress Toward Goals  OT Goals(current goals can now be found in the care plan section)  Progress towards OT goals: Progressing toward goals  Acute Rehab OT Goals Patient Stated Goal: none stated OT Goal Formulation: With patient Time For Goal Achievement: 10/28/23 Potential to Achieve Goals: Good ADL Goals Pt Will Perform Eating: with modified independence;with adaptive utensils;sitting Pt Will Perform Grooming: with set-up;sitting;bed level Pt Will Perform Upper Body Dressing: with min assist;sitting Pt Will Transfer to Toilet: with supervision;ambulating;regular height toilet Pt/caregiver will Perform Home Exercise Program: Increased ROM;Increased strength;Both right and left upper extremity;With minimal assist;With written HEP provided Additional ADL Goal #1: Pt will complete fine motor HEP for R hand  Plan       Co-evaluation                 AM-PAC OT "6 Clicks" Daily Activity     Outcome Measure   Help from another person eating meals?: A Little Help from another person taking care of personal grooming?: A Little Help from another person toileting, which includes using toliet, bedpan, or urinal?: A Little Help from another person bathing (including washing, rinsing, drying)?: A Little Help from another person to put on and taking off regular upper body clothing?: A Little Help from another person to put on and taking off regular lower body clothing?: A Little 6 Click Score: 18    End of Session Equipment Utilized During Treatment: Gait belt;Rolling walker (2 wheels)  OT Visit Diagnosis: Unsteadiness on feet (R26.81);Muscle weakness (generalized) (M62.81)   Activity Tolerance Patient tolerated treatment well   Patient Left in bed;with bed alarm set;with call bell/phone within reach   Nurse Communication Mobility status        Time:  0981-1914 OT Time Calculation (min): 36 min  Charges: OT General Charges $OT Visit: 1 Visit OT Treatments $Therapeutic Activity: 8-22 mins $Therapeutic Exercise: 8-22 mins  Lynnda Shields 10/15/2023, 5:33 PM

## 2023-10-15 NOTE — Progress Notes (Signed)
 Pt transported off unit via wheelchair with RN to [phone], with all personal belongings and pt's mother accompanied. Report given to Shanda Bumps, Charity fundraiser.

## 2023-10-16 DIAGNOSIS — R569 Unspecified convulsions: Secondary | ICD-10-CM | POA: Diagnosis not present

## 2023-10-16 DIAGNOSIS — F10931 Alcohol use, unspecified with withdrawal delirium: Secondary | ICD-10-CM | POA: Diagnosis not present

## 2023-10-16 LAB — BASIC METABOLIC PANEL
Anion gap: 12 (ref 5–15)
BUN: 9 mg/dL (ref 6–20)
CO2: 28 mmol/L (ref 22–32)
Calcium: 8 mg/dL — ABNORMAL LOW (ref 8.9–10.3)
Chloride: 96 mmol/L — ABNORMAL LOW (ref 98–111)
Creatinine, Ser: 0.64 mg/dL (ref 0.61–1.24)
GFR, Estimated: >60 mL/min (ref >60)
Glucose, Bld: 97 mg/dL (ref 70–99)
Potassium: 3.6 mmol/L (ref 3.5–5.1)
Sodium: 136 mmol/L (ref 135–145)

## 2023-10-16 LAB — GLUCOSE, CAPILLARY
Glucose-Capillary: 97 mg/dL (ref 70–99)
Glucose-Capillary: 81 mg/dL (ref 70–99)
Glucose-Capillary: 112 mg/dL — ABNORMAL HIGH (ref 70–99)
Glucose-Capillary: 123 mg/dL — ABNORMAL HIGH (ref 70–99)

## 2023-10-16 LAB — MAGNESIUM: Magnesium: 1.6 mg/dL — ABNORMAL LOW (ref 1.7–2.4)

## 2023-10-16 MED ORDER — SODIUM CHLORIDE 0.9 % IV SOLN: INTRAVENOUS | Status: DC

## 2023-10-16 MED ORDER — POTASSIUM CHLORIDE CRYS ER 20 MEQ PO TBCR
40.0000 meq | EXTENDED_RELEASE_TABLET | Freq: Two times a day (BID) | ORAL | Status: AC
  Administered 2023-10-16 (×2): 40 meq via ORAL
  Filled 2023-10-16 (×2): qty 2

## 2023-10-16 MED ORDER — ALBUMIN HUMAN 25 % IV SOLN
25.0000 g | Freq: Four times a day (QID) | INTRAVENOUS | Status: AC
  Administered 2023-10-16 – 2023-10-17 (×4): 25 g via INTRAVENOUS
  Filled 2023-10-16 (×4): qty 100

## 2023-10-16 MED ORDER — MAGNESIUM OXIDE -MG SUPPLEMENT 400 (240 MG) MG PO TABS
400.0000 mg | ORAL_TABLET | Freq: Two times a day (BID) | ORAL | Status: AC
  Administered 2023-10-16 (×2): 400 mg via ORAL
  Filled 2023-10-16 (×2): qty 1

## 2023-10-16 NOTE — Progress Notes (Signed)
 Patient refused education or other substance abuse resources.

## 2023-10-16 NOTE — Progress Notes (Signed)
    Durable Medical Equipment  (From admission, onward)           Start     Ordered   10/16/23 1348  For home use only DME Bedside commode  Once       Question:  Patient needs a bedside commode to treat with the following condition  Answer:  Weakness   10/16/23 1347            The patient is confined to one level of the home environment and there is no toilet on the that level of the home.

## 2023-10-16 NOTE — Progress Notes (Signed)
 [facility] PROGRESS NOTE    Progress Note  DEVERY MURGIA  NGE:[id number] DOB: [birth year] DOA: 10/02/2023 PCP: Benita Stabile, MD     Brief Narrative:   QUAMERE MUSSELL is an 48 y.o. male past medical history of chronic inflammatory demyelinating polyneuropathy, alcohol disorder seizure presents to [facility] with altered mental status, last drink 3 to 4 days prior to admission.  He did became aggressive and falling in the ED was found to have been severe alcohol withdrawal mated under PCCM started on Precedex   Assessment/Plan:   Alcohol withdrawal seizure (HCC): Last drink was 4 days prior to admission. Has completed his phenobarbital taper. Continue Ativan. Continue thiamine and folate. PT reevaluated the patient will need home health PT. Continue seizure precautions. Home in 24 hours.  Hydro electrolytic imbalance/Hyponatremia/hypokalemia/hypocalcemia/hypomagnesemia: Suspect likely hypovolemic.  Started on IV fluids hyponatremia has resolved. Magnesium was borderline, continue replete orally recheck in the morning. Mag still low 1.6 replete orally recheck in the morning. With good urine output urine has cleared  Left upper extremity wound present on admission: Wound care and orthopedic surgery was consulted. Orthopedic surgery recommended an MRI which was were unable to perform. CT scan showed no abscess or evidence of internal injury. Has completed his course of antibiotics.    Aspiration pneumonia: Completed his course of antibiotics in house. Has defervesced leukocytosis improved.  Chronic diastolic heart failure: With an EF of 60%.,  Satting greater than 96% on room air. Creatinine is rising we will give a bolus of normal saline.  High anion gap metabolic acidosis/lactic acidosis: Likely due to seizure plus or minus starvation ketosis activity now clear with conservative management.  Transaminitis: We have stabilized CK is 105. Likely due  to alcohol abuse.  Acute kidney injury: Hemodynamically mediated. Resolved with IV fluid resuscitation.  Left upper extremity wounds present on admission: Completed his course of antibiotics.  Incidental bilateral hygromas: Continue to monitor.  Diabetes mellitus type 2/hypoglycemia: At home he was using metformin and linagliptin. All Oral hypoglycemic agents were held, continue sliding scale insulin post low-dose long-acting. Currently on on tube feedings.  Severe protein caloric malnutrition/nutrition: Possible core track tolerating his diet. CIDP: Continue Cymbalta.  Left shoulder pain: Imaging show osteoarthritis continue analgesics.    DVT prophylaxis: lovenox Family Communication:none Status is: Inpatient Remains inpatient appropriate because: Acute alcohol withdrawal    Code Status:     Code Status Orders  (From admission, onward)           Start     Ordered   10/02/23 1701  Full code  Continuous       Question:  By:  Answer:  Default: patient does not have capacity for decision making, no surrogate or prior directive available   10/02/23 1704           Code Status History     Date Active Date Inactive Code Status Order ID Comments User Context   08/19/2023 1701 08/26/2023 1602 Full Code [id number]  Lynnae Prude Inpatient   08/19/2023 1701 08/19/2023 1701 Full Code [id number]  Lynnae Prude Inpatient   08/08/2023 1441 08/19/2023 1655 Full Code [id number]  Kendell Bane, MD ED   01/28/2023 1158 02/03/2023 1751 Full Code [id number]  Cleora Fleet, MD ED         IV Access:   Peripheral IV   Procedures and diagnostic studies:   No results found.    Medical Consultants:   None.   Subjective:  SMITH POTENZA feels better no complaints tolerating his diet.  Objective:    Vitals:   10/16/23 0341 10/16/23 0435 10/16/23 0852 10/16/23 1156  BP: 117/80  124/84 107/76  Pulse: 88  95 85  Resp: 18      Temp: 97.9 F (36.6 C)  98 F (36.7 C) 97.6 F (36.4 C)  TempSrc: Axillary  Oral Oral  SpO2: 97%  100% 100%  Weight:  110.5 kg    Height:       SpO2: 100 % O2 Flow Rate (L/min): 2 L/min   Intake/Output Summary (Last 24 hours) at 10/16/2023 1222 Last data filed at 10/15/2023 2114 Gross per 24 hour  Intake 379.52 ml  Output --  Net 379.52 ml   Filed Weights   10/14/23 0735 10/15/23 0414 10/16/23 0435  Weight: 115.7 kg 114.5 kg 110.5 kg    Exam: General exam: In no acute distress. Respiratory system: Good air movement and clear to auscultation. Cardiovascular system: S1 & S2 heard, RRR. No JVD. Gastrointestinal system: Abdomen is nondistended, soft and nontender.  Extremities: No pedal edema. Skin: No rashes, lesions or ulcers  Data Reviewed:    Labs: Basic Metabolic Panel: Recent Labs  Lab 10/10/23 0519 10/11/23 0514 10/13/23 1102 10/14/23 0607 10/14/23 1441 10/15/23 0454 10/16/23 1003  NA 138   < > 137 135 132* 132* 136  K 4.1   < > 3.5 2.8* 3.4* 2.9* 3.6  CL 108   < > 101 99 97* 94* 96*  CO2 24   < > 20* 24 26 25 28   GLUCOSE 87   < > 104* 82 141* 117* 97  BUN 16   < > 12 10 11 10 9   CREATININE 0.49*   < > 0.62 0.62 0.73 0.81 0.64  CALCIUM 7.4*   < > 8.1* 7.6* 7.5* 7.6* 8.0*  MG 1.7   < > 1.8 1.4* 2.0 1.5* 1.6*  PHOS 2.9  --   --   --   --   --   --    < > = values in this interval not displayed.   GFR Estimated Creatinine Clearance: 134.6 mL/min (by C-G formula based on SCr of 0.64 mg/dL). Liver Function Tests: Recent Labs  Lab 10/10/23 0519 10/11/23 0514 10/12/23 0916 10/14/23 0607 10/15/23 0454  AST 79* 91* 79* 97* 92*  ALT 61* 64* 59* 61* 58*  ALKPHOS 286* 302* 314* 324* 332*  BILITOT 2.1* 2.1* 2.1* 1.8* 1.6*  PROT 4.6* 4.7* 4.5* 4.8* 4.8*  ALBUMIN 1.9* 1.8* 1.8* 1.8* 1.9*   No results for input(s): "LIPASE", "AMYLASE" in the last 168 hours. No results for input(s): "AMMONIA" in the last 168 hours.  Coagulation profile No results for  input(s): "INR", "PROTIME" in the last 168 hours.  COVID-19 Labs  No results for input(s): "DDIMER", "FERRITIN", "LDH", "CRP" in the last 72 hours.  No results found for: "SARSCOV2NAA"  CBC: Recent Labs  Lab 10/10/23 0519 10/11/23 0514 10/12/23 0916 10/14/23 0607 10/15/23 0454  WBC 10.0 10.6* 8.7 6.9 7.2  HGB 9.6* 9.7* 9.5* 9.9* 10.1*  HCT 31.1* 31.0* 29.9* 30.6* 30.9*  MCV 103.0* 103.7* 101.0* 100.3* 100.0  PLT 360 394 446* 507* 549*   Cardiac Enzymes: No results for input(s): "CKTOTAL", "CKMB", "CKMBINDEX", "TROPONINI" in the last 168 hours.  BNP (last 3 results) No results for input(s): "PROBNP" in the last 8760 hours. CBG: Recent Labs  Lab 10/15/23 1149 10/15/23 1708 10/15/23 2111 10/16/23 0642 10/16/23 1154  GLUCAP 129*  155* 105* 97 81   D-Dimer: No results for input(s): "DDIMER" in the last 72 hours. Hgb A1c: No results for input(s): "HGBA1C" in the last 72 hours. Lipid Profile: No results for input(s): "CHOL", "HDL", "LDLCALC", "TRIG", "CHOLHDL", "LDLDIRECT" in the last 72 hours. Thyroid function studies: No results for input(s): "TSH", "T4TOTAL", "T3FREE", "THYROIDAB" in the last 72 hours.  Invalid input(s): "FREET3" Anemia work up: No results for input(s): "VITAMINB12", "FOLATE", "FERRITIN", "TIBC", "IRON", "RETICCTPCT" in the last 72 hours. Sepsis Labs: Recent Labs  Lab 10/11/23 0514 10/12/23 0916 10/14/23 0607 10/15/23 0454  WBC 10.6* 8.7 6.9 7.2   Microbiology No results found for this or any previous visit (from the past 240 hours).    Medications:    benzonatate  100 mg Oral TID   DULoxetine  60 mg Oral BID   enoxaparin (LOVENOX) injection  40 mg Subcutaneous Q24H   febuxostat  40 mg Oral Daily   feeding supplement  237 mL Oral BID BM   insulin aspart  0-5 Units Subcutaneous QHS   insulin aspart  0-9 Units Subcutaneous TID WC   lactulose  20 g Oral Daily   leptospermum manuka honey  1 Application Topical Daily   lidocaine  2  patch Transdermal Q24H   nicotine  14 mg Transdermal Daily   nortriptyline  50 mg Oral QHS   nutrition supplement (JUVEN)  1 packet Oral BID BM   mouth rinse  15 mL Mouth Rinse 4 times per day   Oxcarbazepine  300 mg Oral Daily   OXcarbazepine  600 mg Oral QHS   Continuous Infusions:      LOS: 14 days   Marinda Elk  [facility]  10/16/2023, 12:22 PM

## 2023-10-16 NOTE — TOC CAGE-AID Note (Signed)
 Transition of Care [facility]) - CAGE-AID Screening   Patient Details  Name: Connor Copeland MRN: [id number] Date of Birth: July 24, 1976  [facility]) CM/SW Contact:    Elberta Fortis, RN Phone Number: 10/16/2023, 3:37 PM   Clinical Narrative:  Inpatient outpatient counseling resources offered and pt refused.   CAGE-AID Screening:    Have You Ever Felt You Ought to Cut Down on Your Drinking or Drug Use?: Yes Have People Annoyed You By Critizing Your Drinking Or Drug Use?: Yes Have You Felt Bad Or Guilty About Your Drinking Or Drug Use?: No Have You Ever Had a Drink or Used Drugs First Thing In The Morning to Steady Your Nerves or to Get Rid of a Hangover?: No CAGE-AID Score: 2  Substance Abuse Education Offered: Yes (Patient declined resources)

## 2023-10-16 NOTE — TOC Initial Note (Addendum)
 Transition of Care [facility]) - Initial/Assessment Note    Patient Details  Name: Connor Copeland MRN: [id number] Date of Birth: October 07, 1975  Transition [facility]) CM/SW Contact:    Kermit Balo, RN Phone Number: 10/16/2023, 1:54 PM  Clinical Narrative:                  Pt is from home alone. He states his mother lives close by. She can check on him . He states he has friends that can stay with him after d/c for a few days. CM asked him to contact them so we can get confirmation. He states he will work on this.  BSC ordered through [employer] and will be delivered to the room. Pt with recommendations for [facility] services. Pt doesn't have a preference on agency. CM will work on arranging HH prior to d/c.  Pt states his mother will provide transportation home when medically ready.  TOC following.  1543: [facility] accepted for home health. Information added to AVS.   Expected Discharge Plan: Home w Home Health Services Barriers to Discharge: Continued Medical Work up   Patient Goals and CMS Choice     Choice offered to / list presented to : Patient      Expected Discharge Plan and Services   Discharge Planning Services: CM Consult Post Acute Care Choice: Home Health, Durable Medical Equipment Living arrangements for the past 2 months: Single Family Home                           HH Arranged: PT, OT, Speech Therapy          Prior Living Arrangements/Services Living arrangements for the past 2 months: Single Family Home Lives with:: Self Patient language and need for interpreter reviewed:: Yes Do you feel safe going back to the place where you live?: Yes        Care giver support system in place?: Yes (comment) (pt is working on friends to stay with him) Current home services: DME (walker/ rollator) Criminal Activity/Legal Involvement Pertinent to Current Situation/Hospitalization: No - Comment as needed  Activities of Daily Living   ADL Screening (condition  at time of admission) Independently performs ADLs?: Yes (appropriate for developmental age) Is the patient deaf or have difficulty hearing?: No Does the patient have difficulty seeing, even when wearing glasses/contacts?: No Does the patient have difficulty concentrating, remembering, or making decisions?: Yes  Permission Sought/Granted                  Emotional Assessment Appearance:: Appears stated age Attitude/Demeanor/Rapport: Engaged Affect (typically observed): Accepting Orientation: : Oriented to Self, Oriented to Place, Oriented to Situation Alcohol / Substance Use: Alcohol Use Psych Involvement: No (comment)  Admission diagnosis:  Hyponatremia [E87.1] Alcohol withdrawal seizure (HCC) [F10.939, R56.9] Alcohol withdrawal seizure with delirium (HCC) [F10.931, R56.9] 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] Patient Active Problem List   Diagnosis Date Noted   Open wound of left upper extremity 10/11/2023   Aspiration pneumonia (HCC) 10/11/2023   Elevated LFTs 10/11/2023   Obesity (BMI 30-39.9) 10/11/2023   Subdural hygroma 10/11/2023   Hypokalemia 10/06/2023   Lactic acidosis 10/06/2023   Acute kidney injury (HCC) 10/06/2023   High anion gap metabolic acidosis 10/06/2023   SVT (supraventricular tachycardia) (HCC) 08/13/2023   Severe alcohol withdrawal with perceptual disturbances (HCC) 08/09/2023   Hyperglycemia 08/08/2023   Gait disturbance 08/04/2023   Dysesthesia 08/04/2023   Seizure disorder (HCC) 03/19/2023  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   Neuropathic pain 03/19/2023   Alcohol withdrawal seizure with delirium (HCC) 01/28/2023   Alcohol abuse 01/28/2023   Tobacco use disorder 01/28/2023   Hyponatremia 01/28/2023   Elevated MCV 01/28/2023   Macrocytic anemia 01/28/2023   Elevated transaminase level 01/28/2023   Gout 01/28/2023   HLD (hyperlipidemia) 12/11/2022   Hypertriglyceridemia 12/11/2022   Peripheral  neuropathy 12/11/2022   Hypoalbuminemia 12/11/2022   PCP:  Benita Stabile, MD Pharmacy:   [facility] [place] [facility] [place], [place] - [address] [place] [address] Phone: [phone] Fax: [phone]  [facility] [address] [place] [address] Phone: [phone] Fax: [phone]  [facility] [address] [place] [address] Phone: [phone] Fax: [phone]     Social Drivers of Health (SDOH) Social History: SDOH Screenings   Food Insecurity: Patient Unable To Answer (10/05/2023)  Housing: Patient Unable To Answer (10/05/2023)  Transportation Needs: Patient Unable To Answer (10/05/2023)  Utilities: Patient Unable To Answer (10/05/2023)  Tobacco Use: High Risk (10/14/2023)   SDOH Interventions:     Readmission Risk Interventions    08/19/2023   10:48 AM  Readmission Risk Prevention Plan  Transportation Screening Complete  PCP or Specialist Appt within 3-5 Days Not Complete  HRI or Home Care Consult Complete  Social Work Consult for Recovery Care Planning/Counseling Complete  Palliative Care Screening Not Applicable  Medication Review Oceanographer) Complete

## 2023-10-17 ENCOUNTER — Other Ambulatory Visit (HOSPITAL_COMMUNITY): Payer: Self-pay

## 2023-10-17 DIAGNOSIS — E871 Hypo-osmolality and hyponatremia: Secondary | ICD-10-CM | POA: Diagnosis not present

## 2023-10-17 DIAGNOSIS — N179 Acute kidney failure, unspecified: Secondary | ICD-10-CM | POA: Diagnosis not present

## 2023-10-17 DIAGNOSIS — E872 Acidosis, unspecified: Secondary | ICD-10-CM

## 2023-10-17 DIAGNOSIS — G40909 Epilepsy, unspecified, not intractable, without status epilepticus: Secondary | ICD-10-CM

## 2023-10-17 DIAGNOSIS — J690 Pneumonitis due to inhalation of food and vomit: Secondary | ICD-10-CM

## 2023-10-17 DIAGNOSIS — F10931 Alcohol use, unspecified with withdrawal delirium: Secondary | ICD-10-CM | POA: Diagnosis not present

## 2023-10-17 LAB — BASIC METABOLIC PANEL
Anion gap: 12 (ref 5–15)
BUN: 6 mg/dL (ref 6–20)
CO2: 22 mmol/L (ref 22–32)
Calcium: 8.1 mg/dL — ABNORMAL LOW (ref 8.9–10.3)
Chloride: 104 mmol/L (ref 98–111)
Creatinine, Ser: 0.6 mg/dL — ABNORMAL LOW (ref 0.61–1.24)
GFR, Estimated: >60 mL/min (ref >60)
Glucose, Bld: 114 mg/dL — ABNORMAL HIGH (ref 70–99)
Potassium: 3.3 mmol/L — ABNORMAL LOW (ref 3.5–5.1)
Sodium: 138 mmol/L (ref 135–145)

## 2023-10-17 LAB — GLUCOSE, CAPILLARY
Glucose-Capillary: 84 mg/dL (ref 70–99)
Glucose-Capillary: 126 mg/dL — ABNORMAL HIGH (ref 70–99)

## 2023-10-17 LAB — MAGNESIUM: Magnesium: 1.6 mg/dL — ABNORMAL LOW (ref 1.7–2.4)

## 2023-10-17 MED ORDER — MAGNESIUM OXIDE -MG SUPPLEMENT 400 (240 MG) MG PO TABS
400.0000 mg | ORAL_TABLET | Freq: Two times a day (BID) | ORAL | Status: DC
Start: 2023-10-17 — End: 2023-10-17

## 2023-10-17 MED ORDER — MAGNESIUM OXIDE -MG SUPPLEMENT 400 (240 MG) MG PO TABS
400.0000 mg | ORAL_TABLET | ORAL | Status: AC
  Administered 2023-10-17 (×2): 400 mg via ORAL
  Filled 2023-10-17 (×2): qty 1

## 2023-10-17 MED ORDER — POTASSIUM CHLORIDE CRYS ER 20 MEQ PO TBCR
40.0000 meq | EXTENDED_RELEASE_TABLET | ORAL | Status: AC
  Administered 2023-10-17 (×2): 40 meq via ORAL
  Filled 2023-10-17 (×2): qty 2

## 2023-10-17 MED ORDER — POTASSIUM CHLORIDE CRYS ER 20 MEQ PO TBCR
40.0000 meq | EXTENDED_RELEASE_TABLET | Freq: Two times a day (BID) | ORAL | Status: DC
Start: 2023-10-17 — End: 2023-10-17

## 2023-10-17 MED ORDER — LACTULOSE 10 GM/15ML PO SOLN
20.0000 g | Freq: Every day | ORAL | 0 refills | Status: DC
Start: 2023-10-18 — End: 2023-11-26
  Filled 2023-10-17: qty 237, 7d supply, fill #0

## 2023-10-17 NOTE — TOC Transition Note (Signed)
 Transition of Care [facility]) - Discharge Note   Patient Details  Name: Connor Copeland MRN: [id number] Date of Birth: [birth year]  Transition [facility] Care [facility]) CM/SW Contact:  Lockie Pares, RN Phone Number: 10/17/2023, 2:43 PM   Clinical Narrative:     Patient will be discharging this afternoon. He has the [facility] at bedside that was ordered. Messaged Becky Sax  [facility] to let her know of discharge.  Final next level of care: Home w Home Health Services Barriers to Discharge: No Barriers Identified   Patient Goals and CMS Choice     Choice offered to / list presented to : Patient      Discharge Placement               Discharge with [facility]      Discharge Plan and Services Additional resources added to the After Visit Summary for     Discharge Planning Services: CM Consult Post Acute Care Choice: Home Health, Durable Medical Equipment                    HH Arranged: PT, OT, Speech Therapy HH Agency: [facility] Date [facility] Agency Contacted: 10/17/23 Time HH Agency Contacted: 1443 Representative spoke with at [facility] Agency: Becky Sax  Social Drivers of Health (SDOH) Interventions SDOH Screenings   Food Insecurity: Patient Unable To Answer (10/05/2023)  Housing: Patient Unable To Answer (10/05/2023)  Transportation Needs: Patient Unable To Answer (10/05/2023)  Utilities: Patient Unable To Answer (10/05/2023)  Tobacco Use: High Risk (10/14/2023)     Readmission Risk Interventions    08/19/2023   10:48 AM  Readmission Risk Prevention Plan  Transportation Screening Complete  PCP or Specialist Appt within 3-5 Days Not Complete  HRI or Home Care Consult Complete  Social Work Consult for Recovery Care Planning/Counseling Complete  Palliative Care Screening Not Applicable  Medication Review Oceanographer) Complete

## 2023-10-17 NOTE — Progress Notes (Signed)
 Physical Therapy Treatment Patient Details Name: Connor Copeland MRN: [id number] DOB: 1976/01/11 Today's Date: 10/17/2023   History of Present Illness 48 yo male admitted 2/27 for ETOH withdrawal and seizures. PMH: chronic inflammatory demyelinating polyneuropathy, alcohol use disorder, seizures, hyponatremia    PT Comments  Pt seen for PT tx with pt agreeable, eager to go home as soon as possible. Pt reports he ambulates without AD at home, only using rollator when going into bigger stores to allow him to have a place to sit & rest. Pt eager to attempt gait without AD on this date. Pt ambulates around unit without AD with supervision with gait pattern as noted below; pt engaged in head turns & speed changes with CGA as pt with decreased balance with head turns. Pt negotiates stairs with 1 rail with supervision. Once back in room, nursing staff entered to report pt had left a trail of blood - pt noted to have bleeding from R great toe. Further activity deferred at this time to allow nurse to dress toe & per pt request.   If plan is discharge home, recommend the following: Assist for transportation;Help with stairs or ramp for entrance;Direct supervision/assist for medications management;Assistance with cooking/housework;Direct supervision/assist for financial management   Can travel by private vehicle     Yes  Equipment Recommendations  None recommended by PT    Recommendations for Other Services       Precautions / Restrictions Precautions Precautions: Fall Recall of Precautions/Restrictions: Intact Restrictions Weight Bearing Restrictions Per Provider Order: No     Mobility  Bed Mobility               General bed mobility comments: not tested, received & left sitting EOB    Transfers Overall transfer level: Needs assistance Equipment used: None Transfers: Sit to/from Stand Sit to Stand: Supervision           General transfer comment: extra time & use of  momentum to assist with powering up to standing from low EOB    Ambulation/Gait Ambulation/Gait assistance: Supervision Gait Distance (Feet):  (>200 ft) Assistive device: None Gait Pattern/deviations: Decreased step length - right, Decreased step length - left, Decreased stride length Gait velocity: decreased     General Gait Details: decreased heel strike BLE, decreased hip/knee flexion during swing phase   Stairs Stairs: Yes Stairs assistance: Supervision Stair Management: One rail Left, One rail Right Number of Stairs: 10 (3" + 6") General stair comments: Pt negotiates steps with 1 rail with supervision with step to pattern, no LOB.   Wheelchair Mobility     Tilt Bed    Modified Rankin (Stroke Patients Only)       Balance Overall balance assessment: Needs assistance Sitting-balance support: Feet supported Sitting balance-Leahy Scale: Good     Standing balance support: During functional activity, No upper extremity supported Standing balance-Leahy Scale: Fair                              Hotel manager: No apparent difficulties  Cognition Arousal: Alert Behavior During Therapy: WFL for tasks assessed/performed   PT - Cognitive impairments: No apparent impairments                       PT - Cognition Comments: like at baseline with cognition, eager to d/c home   Following commands impaired: Only follows one step commands consistently    Cueing Cueing Techniques:  Verbal cues  Exercises Other Exercises Other Exercises: Pt engaged in gait without AD with head turns & changes in gait speed; pt with mild LOB with initial head turns requiring CGA.    General Comments        Pertinent Vitals/Pain Pain Assessment Pain Assessment: No/denies pain    Home Living                          Prior Function            PT Goals (current goals can now be found in the care plan section) Acute Rehab PT  Goals Patient Stated Goal: go home PT Goal Formulation: With patient Time For Goal Achievement: 10/18/23 Potential to Achieve Goals: Good Progress towards PT goals: Progressing toward goals    Frequency    Min 2X/week      PT Plan      Co-evaluation              AM-PAC PT "6 Clicks" Mobility   Outcome Measure  Help needed turning from your back to your side while in a flat bed without using bedrails?: None Help needed moving from lying on your back to sitting on the side of a flat bed without using bedrails?: None Help needed moving to and from a bed to a chair (including a wheelchair)?: None Help needed standing up from a chair using your arms (e.g., wheelchair or bedside chair)?: None Help needed to walk in hospital room?: A Little Help needed climbing 3-5 steps with a railing? : A Little 6 Click Score: 22    End of Session Equipment Utilized During Treatment: Gait belt Activity Tolerance: Patient tolerated treatment well (limited by R great toe bleeding) Patient left: in bed;with bed alarm set;with call bell/phone within reach Nurse Communication:  (R great toe bleeding) PT Visit Diagnosis: Unsteadiness on feet (R26.81);Other abnormalities of gait and mobility (R26.89);Muscle weakness (generalized) (M62.81);Difficulty in walking, not elsewhere classified (R26.2)     Time: 6295-2841 PT Time Calculation (min) (ACUTE ONLY): 13 min  Charges:    $Therapeutic Activity: 8-22 mins PT General Charges $$ ACUTE PT VISIT: 1 Visit                     Aleda Grana, PT, DPT 10/17/23, 11:49 AM   Sandi Mariscal 10/17/2023, 11:47 AM

## 2023-10-17 NOTE — Plan of Care (Signed)
 Discharge instructions discussed with patient.  Patient instructed on home medications, restrictions, and follow up appointments. Belongings gathered and sent with patient.  Patients medications received from [facility] and are at the patients bedside.  Patient called his mother for transportation home.   Patient is waiting on clothing from home.

## 2023-10-17 NOTE — Discharge Summary (Signed)
 Physician Discharge Summary  Connor Copeland Connor Copeland DOB: [birth year] DOA: 10/02/2023  PCP: Benita Stabile, MD  Admit date: 10/02/2023 Discharge date: 10/17/2023  Admitted From: Home Disposition:  Home  Recommendations for Outpatient Follow-up:  Follow up with PCP in 1-2 weeks Please obtain BMP/CBC in one week   Home Health:Yes Equipment/Devices:None  Discharge Condition:Stable CODE STATUS:Full Diet recommendation: Heart Healthy  Brief/Interim Summary: 48 y.o. male past medical history of chronic inflammatory demyelinating polyneuropathy, alcohol disorder seizure presents to [facility] with altered mental status, last drink 3 to 4 days prior to admission.  He did became aggressive and falling in the ED was found to have been severe alcohol withdrawal mated under PCCM started on Precedex   Discharge Diagnoses:  Principal Problem:   Alcohol withdrawal seizure with delirium (HCC) Active Problems:   CIDP (chronic inflammatory demyelinating polyneuropathy) (HCC)   Tobacco use disorder   Hyponatremia   Macrocytic anemia   Seizure disorder (HCC)   Neuropathic pain   Hypokalemia   Lactic acidosis   Acute kidney injury (HCC)   High anion gap metabolic acidosis   Open wound of left upper extremity   Aspiration pneumonia (HCC)   Elevated LFTs   Obesity (BMI 30-39.9)   Subdural hygroma  Acute alcohol withdrawal: Last drink was 4 days prior to admission. Admitted under PCCM started on Precedex and transition to phenobarbital. He was started on thiamine and folate on admission. He showed no further withdrawal seizures. PT evaluated the patient recommended home health PT.  Hydro electrolytic imbalance/hyponatremia/hypokalemia/hypocalcemia/hypomagnesemia, Suspect likely hypovolemic. They were all repleted orally now improved.  Left upper extremity wound present on admission: Wound care was consulted and recommended dressing changes. Orthopedic surgery was consulted  recommended MRI which were unable to perform. CT scan showed no evidence of abscess or internal injury. He completed course of antibiotics in house.  Aspiration pneumonia: He completed his course of antibiotics in house.  Chronic diastolic dysfunction: Creatinine was rising he was started on IV fluids his creatinine returned to baseline. This is likely due to dehydration.  Anion gap metabolic acidosis/lactic acidosis: Likely due to seizure disorder plus or minus starvation ketosis now improved.  Transaminitis: Likely due to alcohol abuse.  Acute kidney injury: Likely hemodynamically mediated resolved with IV fluids.  Incidental bilateral hygromas: Continue to monitor.  Diabetes mellitus type 2: No changes made to his medication he has been noncompliant with medication at home. He will continue oral ischemic agents.  Severe protein caloric malnutrition: Needed the core track temporarily now improved.  CIDP.  Continue Cymbalta.  Left shoulder pain: X-rays show osteoarthritis    Discharge Instructions  Discharge Instructions     Diet - low sodium heart healthy   Complete by: As directed    Increase activity slowly   Complete by: As directed    No wound care   Complete by: As directed       Allergies as of 10/17/2023       Reactions   Neurontin [gabapentin] Swelling   Leg swelling        Medication List     TAKE these medications    acetaminophen 325 MG tablet Commonly known as: TYLENOL Take 2 tablets (650 mg total) by mouth every 6 (six) hours as needed for mild pain (pain score 1-3) (or Fever >/= 101).   amLODipine 2.5 MG tablet Commonly known as: NORVASC Take 1 tablet (2.5 mg total) by mouth daily.   diltiazem 120 MG 24 hr capsule Commonly known  as: CARDIZEM CD Take 1 capsule (120 mg total) by mouth daily.   DULoxetine 60 MG capsule Commonly known as: CYMBALTA Take 2 capsules (120 mg total) by mouth daily.   febuxostat 40 MG  tablet Commonly known as: ULORIC Take 1 tablet (40 mg total) by mouth daily.   fluticasone 50 MCG/ACT nasal spray Commonly known as: FLONASE Place 1 spray into both nostrils daily.   folic acid 1 MG tablet Commonly known as: FOLVITE Take 1 tablet (1 mg total) by mouth daily.   HYDROcodone-acetaminophen 7.5-325 MG tablet Commonly known as: NORCO Take 1 tablet by mouth 3 (three) times daily as needed.   lactulose 10 GM/15ML solution Commonly known as: CHRONULAC Take 30 mLs (20 g total) by mouth daily. Start taking on: October 18, 2023   magnesium oxide 400 (240 Mg) MG tablet Commonly known as: MAG-OX Take 1 tablet (400 mg total) by mouth 2 (two) times daily.   metFORMIN 500 MG tablet Commonly known as: GLUCOPHAGE Take 500 mg by mouth 2 (two) times daily.   multivitamin with minerals Tabs tablet Take 1 tablet by mouth daily.   nicotine 21 mg/24hr patch Commonly known as: NICODERM CQ - dosed in mg/24 hours 21 mg patch daily for 1 week then 14 mg patch daily for 3 weeks then 7 mg patch daily for 3 weeks and stop   nortriptyline 25 MG capsule Commonly known as: PAMELOR Take 2 capsules (50 mg total) by mouth at bedtime.   Oxcarbazepine 300 MG tablet Commonly known as: TRILEPTAL 300 milligrams daily and 600 mg nightly   pantoprazole 40 MG tablet Commonly known as: PROTONIX Take 1 tablet (40 mg total) by mouth daily.   thiamine 100 MG tablet Commonly known as: VITAMIN B1 Take 1 tablet (100 mg total) by mouth daily.   Tradjenta 5 MG Tabs tablet Generic drug: linagliptin Take 1 tablet (5 mg total) by mouth daily.               Durable Medical Equipment  (From admission, onward)           Start     Ordered   10/16/23 1348  For home use only DME Bedside commode  Once       Question:  Patient needs a bedside commode to treat with the following condition  Answer:  Weakness   10/16/23 1347            Follow-up Information     Care, [facility]  Follow up.   Why: [facility] will contact you for the first home visit. Contact information: [address] [place] [address] [phone]                Allergies  Allergen Reactions   Neurontin [Gabapentin] Swelling    Leg swelling    Consultations: Pulmonary and critical care Orthopedic surgery   Procedures/Studies: CT FOREARM LEFT W CONTRAST Result Date: 10/13/2023 CLINICAL DATA:  Soft tissue infection EXAM: CT OF THE UPPER LEFT EXTREMITY WITH CONTRAST TECHNIQUE: Multidetector CT imaging of the left forearm was performed according to the standard protocol following intravenous contrast administration. RADIATION DOSE REDUCTION: This exam was performed according to the departmental dose-optimization program which includes automated exposure control, adjustment of the mA and/or kV according to patient size and/or use of iterative reconstruction technique. CONTRAST:  75mL OMNIPAQUE IOHEXOL 350 MG/ML SOLN COMPARISON:  None Available. FINDINGS: Bones/Joint/Cartilage Mild spurring along the sublime tubercle. There is also spurring of the lateral humeral epicondyle in  the vicinity of the common extensor tendon origination site. Degenerative subcortical cyst formation noted along the coronoid process of the ulna. No acute fracture is observed. There is evidence of a remote scaphoid fracture with a diminutive sclerotic proximal pole characteristic of chronic avascular necrosis, and substantial irregular spurring and geode formation in the distal pole. There is some proximal migration of the capitate although no completed SLAC wrist currently observed. Prominent spurring of the radial styloid may be impinging on prominent lateral spurring of the distal pole of the scaphoid for example on image 28 series 10. There is a large ossicle along the blunted distal margin of the ulnar styloid, possibly a secondary ossification center or old fracture fragment, this abuts the triquetrum. Substantial  geode formation in the triquetrum. Dorsal erosion along the distal ulna for example on image 26 series 11. Mild degenerative arthropathy of the first carpometacarpal articulation. Suspected chondrocalcinosis dorsally in the wrist. Please note that today's exam was not a dedicated examination of the wrist and as such has limited spatial resolution and characterization of the wrist. Ligaments Suboptimally assessed by CT. Muscles and Tendons Soft tissue density in the vicinity of the distal extensor carpi ulnaris tendon for example on image 28 of series 208, likely from tendinopathy. Integrity of the overlying retinaculum not assessed. Soft tissues No supplemental non-categorized findings. IMPRESSION: 1. Evidence of a remote scaphoid fracture with a diminutive sclerotic proximal pole characteristic of chronic avascular necrosis, and substantial irregular spurring and geode formation in the distal pole. There is some proximal migration of the capitate although no completed SLAC wrist currently observed. 2. Prominent spurring of the radial styloid may be impinging on prominent lateral spurring of the distal pole of the scaphoid. 3. Large ossicle along the blunted distal margin of the ulnar styloid, possibly a secondary ossification center or old fracture fragment, this abuts the triquetrum. 4. Dorsal erosion along the distal ulna. 5. Suspected chondrocalcinosis dorsally in the wrist. 6. Soft tissue density in the vicinity of the distal extensor carpi ulnaris tendon, likely from tendinopathy. Integrity of the overlying retinaculum not assessed. 7. Mild degenerative arthropathy of the first carpometacarpal articulation. 8. Spurring of the lateral humeral epicondyle in the vicinity of the common extensor tendon origination site. Electronically Signed   By: Gaylyn Rong M.D.   On: 10/13/2023 11:32   DG Shoulder Right Port Result Date: 10/12/2023 CLINICAL DATA:  Right shoulder pain.  Fall. EXAM: RIGHT SHOULDER - 1  VIEW COMPARISON:  None Available. FINDINGS: There is no evidence of fracture or dislocation. Mild acromioclavicular and glenohumeral degenerative spurring. No erosions or focal bone abnormality. There is no evidence of arthropathy or other focal bone abnormality. Soft tissues are unremarkable. IMPRESSION: 1. No fracture or dislocation of the right shoulder. 2. Mild acromioclavicular and glenohumeral degenerative spurring. Electronically Signed   By: Narda Rutherford M.D.   On: 10/12/2023 15:02   VAS [place] UPPER EXTREMITY VENOUS DUPLEX Result Date: 10/10/2023 UPPER VENOUS STUDY  Patient Name:  JOHNNEY SCARLATA  Date of Exam:   10/10/2023 Medical Rec #: [id number]               Accession #:    [id number] Date of Birth: Nov 10, 1975               Patient Gender: M Patient Age:   4 years Exam Location:  [facility] Procedure:      VAS [place] UPPER EXTREMITY VENOUS DUPLEX Referring Phys: PRANAV PATEL --------------------------------------------------------------------------------  Indications: Edema Risk Factors:  None identified. Limitations: Poor ultrasound/tissue interface and patient constant movement, poor patient cooperation. Comparison Study: No prior studies. Performing Technologist: Chanda Busing RVT  Examination Guidelines: A complete evaluation includes B-mode imaging, spectral Doppler, color Doppler, and power Doppler as needed of all accessible portions of each vessel. Bilateral testing is considered an integral part of a complete examination. Limited examinations for reoccurring indications may be performed as noted.  Right Findings: +----------+------------+---------+-----------+----------+-------+ RIGHT     CompressiblePhasicitySpontaneousPropertiesSummary +----------+------------+---------+-----------+----------+-------+ IJV           Full       Yes       Yes                      +----------+------------+---------+-----------+----------+-------+ Subclavian    Full       Yes        Yes                      +----------+------------+---------+-----------+----------+-------+ Axillary      Full       Yes       Yes                      +----------+------------+---------+-----------+----------+-------+ Brachial      Full                                          +----------+------------+---------+-----------+----------+-------+ Radial        Full                                          +----------+------------+---------+-----------+----------+-------+ Ulnar         Full                                          +----------+------------+---------+-----------+----------+-------+ Cephalic      Full                                          +----------+------------+---------+-----------+----------+-------+ Basilic       Full                                          +----------+------------+---------+-----------+----------+-------+  Left Findings: +----------+------------+---------+-----------+----------+-------+ LEFT      CompressiblePhasicitySpontaneousPropertiesSummary +----------+------------+---------+-----------+----------+-------+ Subclavian    Full       Yes       Yes                      +----------+------------+---------+-----------+----------+-------+  Summary:  Right: No evidence of deep vein thrombosis in the upper extremity. No evidence of superficial vein thrombosis in the upper extremity.  Left: No evidence of thrombosis in the subclavian.  *See table(s) above for measurements and observations.  Diagnosing physician: Gerarda Fraction Electronically signed by Gerarda Fraction on 10/10/2023 at 4:06:16 PM.    Final    DG Chest Port 1 View Result Date: 10/06/2023 CLINICAL DATA:  Acute respiratory failure. EXAM: PORTABLE CHEST 1 VIEW COMPARISON:  01/28/2023 FINDINGS: Low volume film with asymmetric elevation of the right hemidiaphragm, similar to prior. Vascular congestion without focal consolidation or overt edema. No pleural effusion.  Cardiopericardial silhouette is at upper limits of normal for size. A feeding tube passes into the stomach although the distal tip position is not included on the film. Telemetry leads overlie the chest. IMPRESSION: Low volume film with vascular congestion. Electronically Signed   By: Kennith Center M.D.   On: 10/06/2023 09:47   Overnight EEG with video Result Date: 10/03/2023 Charlsie Quest, MD     10/04/2023  7:42 AM Patient Name: ZACHERIE HONEYMAN MRN: [id number] Epilepsy Attending: Charlsie Quest Referring Physician/Provider: Lynnell Catalan, MD Duration: 10/02/2023 1816 to 10/03/2023 1816 Patient history: 48 year old man who presented to [facility] ED with agitation and hallucinations. Multiple witnessed seizures which stopped with ativan but behaviour still not normal. EEG to evaluate for seizure Level of alertness: Awake, asleep AEDs during EEG study: Phenobarb Technical aspects: This EEG study was done with scalp electrodes positioned according to the 10-20 International system of electrode placement. Electrical activity was reviewed with band pass filter of 1-70Hz , sensitivity of 7 uV/mm, display speed of 3mm/sec with a 60Hz  notched filter applied as appropriate. EEG data were recorded continuously and digitally stored.  Video monitoring was available and reviewed as appropriate. Description: No clear posterior dominant rhythm was seen.  EEG showed continuous generalized 3 to 6 Hz theta- delta slowing.  Sleep was characterized by sleep symptoms (12 to 14 Hz), maximal fronto-central region.  Hyperventilation and photic stimulation were not performed.   Event button was pressed on 10/03/2023 at 430 for stiffening of bilateral upper extremities.  Concomitant EEG before, during and after the event showed significant myogenic artifact and did not show any EEG to suggest seizure. ABNORMALITY - Continuous slow, generalized IMPRESSION: This study is suggestive of moderate diffuse encephalopathy. No seizures or  epileptiform discharges were seen throughout the recording. Event button was pressed on 10/03/2023 at 0430 for stiffening of bilateral upper extremities without concomitant EEG change.  This was most likely not an epileptic event. Priyanka Annabelle Harman   US Abdomen Limited RUQ (LIVER/GB) Result Date: 10/02/2023 CLINICAL DATA:  Cirrhosis EXAM: ULTRASOUND ABDOMEN LIMITED RIGHT UPPER QUADRANT COMPARISON:  Ultrasound 12/23/2022 FINDINGS: Gallbladder: No gallstones or wall thickening visualized. No sonographic Murphy sign noted by sonographer. Common bile duct: Diameter: 4.8 mm Liver: Diffusely echogenic liver parenchyma. No focal hepatic abnormality. No overt contour nodularity. Portal vein is patent on color Doppler imaging with normal direction of blood flow towards the liver. Other: None. IMPRESSION: 1. Echogenic liver parenchyma consistent with hepatic steatosis and hepatocellular disease. 2. Otherwise negative examination Electronically Signed   By: Jasmine Pang M.D.   On: 10/02/2023 22:34   CT Cervical Spine Wo Contrast Result Date: 10/02/2023 CLINICAL DATA:  Altered mental status, falls EXAM: CT CERVICAL SPINE WITHOUT CONTRAST TECHNIQUE: Multidetector CT imaging of the cervical spine was performed without intravenous contrast. Multiplanar CT image reconstructions were also generated. RADIATION DOSE REDUCTION: This exam was performed according to the departmental dose-optimization program which includes automated exposure control, adjustment of the mA and/or kV according to patient size and/or use of iterative reconstruction technique. COMPARISON:  01/28/2023 FINDINGS: Alignment: Alignment is maintained. No listhesis. No facet subluxation or dislocation. Skull base and vertebrae: No acute fracture. No primary bone lesion or focal pathologic process. Soft tissues and spinal canal: No prevertebral fluid or swelling. No visible canal hematoma. Disc levels: Intervertebral disc spaces are  maintained. Mild degenerative  endplate osteophytes at C6-7. Small disc osteophyte complex at C6-7 without significant stenosis. No high-grade osseous spinal canal stenosis. No high-grade foraminal stenosis. Degenerative changes at the atlantal dens articulation. Upper chest: Negative. Other: None. IMPRESSION: 1. No acute fracture or traumatic subluxation of the cervical spine. 2. Mild degenerative changes at C6-7. Electronically Signed   By: Emily Filbert M.D.   On: 10/02/2023 14:00   CT Head Wo Contrast Result Date: 10/02/2023 CLINICAL DATA:  Head trauma, abnormal mental status. History of falls. EXAM: CT HEAD WITHOUT CONTRAST TECHNIQUE: Contiguous axial images were obtained from the base of the skull through the vertex without intravenous contrast. RADIATION DOSE REDUCTION: This exam was performed according to the departmental dose-optimization program which includes automated exposure control, adjustment of the mA and/or kV according to patient size and/or use of iterative reconstruction technique. COMPARISON:  Head CT 08/10/2023 FINDINGS: Brain: New uniformly low-density subdural fluid collections anteriorly over both cerebral convexities measure up to 10 mm in thickness on the right and 9 mm on the left. There is associated mild mass effect on the frontal lobes. There is no midline shift. No hyperdense intracranial hemorrhage, acute infarct, or mass is evident. The ventricles are mildly smaller than on the prior CT related to mass effect from the subdural collections. Vascular: No hyperdense vessel. Skull: No acute fracture or suspicious lesion. Sinuses/Orbits: Mild mucosal thickening in the included portions of the maxillary sinuses. Small bilateral mastoid effusions. Unremarkable orbits. Other: None. IMPRESSION: New bilateral low-density subdural fluid collections over the cerebral convexities compatible with hygromas. Mild mass effect without midline shift. Electronically Signed   By: Sebastian Ache M.D.   On: 10/02/2023 12:02     Subjective: No complaints  Discharge Exam: Vitals:   10/17/23 0741 10/17/23 1153  BP: 100/79 108/82  Pulse: 92 98  Resp: 18 19  Temp: 97.6 F (36.4 C) 98.7 F (37.1 C)  SpO2: 99% 100%   Vitals:   10/17/23 0453 10/17/23 0454 10/17/23 0741 10/17/23 1153  BP: 111/71  100/79 108/82  Pulse: 87  92 98  Resp: 18  18 19   Temp: 98.1 F (36.7 C)  97.6 F (36.4 C) 98.7 F (37.1 C)  TempSrc: Axillary  Oral Oral  SpO2: 98%  99% 100%  Weight:  107.2 kg    Height:        General: Pt is alert, awake, not in acute distress Cardiovascular: RRR, S1/S2 +, no rubs, no gallops Respiratory: CTA bilaterally, no wheezing, no rhonchi Abdominal: Soft, NT, ND, bowel sounds + Extremities: no edema, no cyanosis    The results of significant diagnostics from this hospitalization (including imaging, microbiology, ancillary and laboratory) are listed below for reference.     Microbiology: No results found for this or any previous visit (from the past 240 hours).   Labs: BNP (last 3 results) Recent Labs    12/12/22 2040  BNP 107.0*   Basic Metabolic Panel: Recent Labs  Lab 10/14/23 0607 10/14/23 1441 10/15/23 0454 10/16/23 1003 10/17/23 0712  NA 135 132* 132* 136 138  K 2.8* 3.4* 2.9* 3.6 3.3*  CL 99 97* 94* 96* 104  CO2 24 26 25 28 22   GLUCOSE 82 141* 117* 97 114*  BUN 10 11 10 9 6   CREATININE 0.62 0.73 0.81 0.64 0.60*  CALCIUM 7.6* 7.5* 7.6* 8.0* 8.1*  MG 1.4* 2.0 1.5* 1.6* 1.6*   Liver Function Tests: Recent Labs  Lab 10/11/23 2130 10/12/23 8657 10/14/23 8469 10/15/23 0454  AST 91* 79* 97* 92*  ALT 64* 59* 61* 58*  ALKPHOS 302* 314* 324* 332*  BILITOT 2.1* 2.1* 1.8* 1.6*  PROT 4.7* 4.5* 4.8* 4.8*  ALBUMIN 1.8* 1.8* 1.8* 1.9*   No results for input(s): "LIPASE", "AMYLASE" in the last 168 hours. No results for input(s): "AMMONIA" in the last 168 hours. CBC: Recent Labs  Lab 10/11/23 0514 10/12/23 0916 10/14/23 0607 10/15/23 0454  WBC 10.6* 8.7 6.9 7.2   HGB 9.7* 9.5* 9.9* 10.1*  HCT 31.0* 29.9* 30.6* 30.9*  MCV 103.7* 101.0* 100.3* 100.0  PLT 394 446* 507* 549*   Cardiac Enzymes: No results for input(s): "CKTOTAL", "CKMB", "CKMBINDEX", "TROPONINI" in the last 168 hours. BNP: Invalid input(s): "POCBNP" CBG: Recent Labs  Lab 10/16/23 1154 10/16/23 1620 10/16/23 2106 10/17/23 0629 10/17/23 1154  GLUCAP 81 112* 123* 84 126*   D-Dimer No results for input(s): "DDIMER" in the last 72 hours. Hgb A1c No results for input(s): "HGBA1C" in the last 72 hours. Lipid Profile No results for input(s): "CHOL", "HDL", "LDLCALC", "TRIG", "CHOLHDL", "LDLDIRECT" in the last 72 hours. Thyroid function studies No results for input(s): "TSH", "T4TOTAL", "T3FREE", "THYROIDAB" in the last 72 hours.  Invalid input(s): "FREET3" Anemia work up No results for input(s): "VITAMINB12", "FOLATE", "FERRITIN", "TIBC", "IRON", "RETICCTPCT" in the last 72 hours. Urinalysis    Component Value Date/Time   COLORURINE AMBER (A) 10/03/2023 0445   APPEARANCEUR CLEAR 10/03/2023 0445   LABSPEC 1.014 10/03/2023 0445   PHURINE 6.0 10/03/2023 0445   GLUCOSEU NEGATIVE 10/03/2023 0445   HGBUR SMALL (A) 10/03/2023 0445   BILIRUBINUR NEGATIVE 10/03/2023 0445   KETONESUR 5 (A) 10/03/2023 0445   PROTEINUR NEGATIVE 10/03/2023 0445   NITRITE NEGATIVE 10/03/2023 0445   LEUKOCYTESUR NEGATIVE 10/03/2023 0445   Sepsis Labs Recent Labs  Lab 10/11/23 0514 10/12/23 0916 10/14/23 0607 10/15/23 0454  WBC 10.6* 8.7 6.9 7.2   Microbiology No results found for this or any previous visit (from the past 240 hours).   Time coordinating discharge: Over 35 minutes  SIGNED:   Marinda Elk, MD  [facility] 10/17/2023, 12:25 PM Pager   If 7PM-7AM, please contact night-coverage [email or web address] Password [email or web address]

## 2023-10-17 NOTE — Plan of Care (Signed)
  Problem: Education: Goal: Ability to describe self-care measures that may prevent or decrease complications (Diabetes Survival Skills Education) will improve Outcome: Progressing   Problem: Coping: Goal: Ability to adjust to condition or change in health will improve Outcome: Progressing   Problem: Metabolic: Goal: Ability to maintain appropriate glucose levels will improve Outcome: Progressing   Problem: Skin Integrity: Goal: Risk for impaired skin integrity will decrease Outcome: Progressing   Problem: Education: Goal: Knowledge of General Education information will improve Description: Including pain rating scale, medication(s)/side effects and non-pharmacologic comfort measures Outcome: Progressing

## 2023-10-17 NOTE — Progress Notes (Signed)
 Nutrition Follow-up  DOCUMENTATION CODES:   Not applicable  INTERVENTION:  Ensure Enlive po BID, each supplement provides 350 kcal and 20 grams of protein.  Continue current diet as ordered  1 packet Juven BID, each packet provides 95 calories, 2.5 grams of protein (collagen) to support wound healing  MVI with minerals daily   NUTRITION DIAGNOSIS:   Increased nutrient needs related to wound healing as evidenced by estimated needs.  ongoing  GOAL:   Patient will meet greater than or equal to 90% of their needs  Being addressed  MONITOR:   TF tolerance  REASON FOR ASSESSMENT:   Consult Enteral/tube feeding initiation and management  ASSESSMENT:   Pt with PMH of chronic demyelinating polyneuropathy, ETOH use disorder, seizures, and hyponatremia admitted with ETOH withdrawal and seizures.  2/28 - s/p cortrak tube; tip gastric  3/1 - Dysphagia 3, thin liquids 3/2 - Transferred out of ICU 3/4 - NPO  3/6 - Cortrak removed, TF removed  3/7 - Dysphagia 3, thin liquids, assistance w/ meals 3/11- advanced to regular solids; on carb mod diet  Pt reports not liking the ensure or any milk based supplements. Brought pt boost breeze during visit to try instead; pt disliked this supplement as well. Pt reports hating magic cup and not even liking regular ice cream. Noted pt has Juven ordered, however, pt reports he has not been receiving it. He is not agreeable to trying any nutrition supplements at this point in time. Pt reports he does not think liberalizing his diet to regular diet will help his po intake. Encouraged pt to continue consuming the supplements as tolerated. He attributes his poor po intake to the hospital food quality and a poor appetite at baseline. He reports never eating much at home PTA. He is unable to provide details on what he eats at home, but says it is usually just snacks and no meals. Discussed importance of adequate po intake, especially given that he is  resistant to drinking any nutrition supplements.   Meds: ensure enlive, novolog, juven, colace PRN, miralax PRN  Labs: potassium 3.3, magnesium 1.6, creatinine 0.60   Diet Order:   Diet Order             Diet - low sodium heart healthy           Diet Carb Modified Fluid consistency: Thin; Room service appropriate? Yes  Diet effective now                   EDUCATION NEEDS:   Not appropriate for education at this time  Skin:  Skin Assessment: Skin Integrity Issues: Skin Integrity Issues:: Other (Comment) Other: L arm full thickness to muscle, ncrotic wound  Last BM:  3/14  Height:   Ht Readings from Last 1 Encounters:  10/02/23 5\' 8"  (1.727 m)    Weight:   Wt Readings from Last 1 Encounters:  10/17/23 107.2 kg    Ideal Body Weight:  70 kg  BMI:  Body mass index is 35.93 kg/m.  Estimated Nutritional Needs:   Kcal:  2300-2500  Protein:  120-140 grams  Fluid:  >2 L/day    Maceo Pro, MS Dietetic Intern

## 2023-10-17 NOTE — Progress Notes (Signed)
 Speech Language Pathology Treatment: Dysphagia  Patient Details Name: Connor Copeland MRN: [id number] DOB: 1975-09-19 Today's Date: 10/17/2023 Time: 6440-3474 SLP Time Calculation (min) (ACUTE ONLY): 11 min  Assessment / Plan / Recommendation Clinical Impression  Pt is pleased with his upgrade to regular solids from last visit and denies any difficulties. He ate an apple while SLP present, biting into it and chewing with no overt signs of oral dysphagia. No overt pharyngeal dysphagia or signs of aspiration noted although pt declined any thin liquids offered. Session also included education regarding safety precautions from a cognitive standpoint as pt is nearing discharge. He assures SLP that he has assistance available from multiple family members. Although f/u for swallowing is not needed, he would benefit from [facility] SLP to ensure cognitive status has returned to baseline.   HPI HPI: 48 year old male with past medical history of chronic inflammatory demyelinating polyneuropathy, alcohol use disorder, seizures, hyponatremia who presented to [facility] ED on 2/27 with altered mental status. Reported last drink 3-4 days ago. Becoming more aggressive, multiple falls being treated for ETOH withdrawal. Pt s/p cortrak 2/28 due to AMS.      SLP Plan  Continue with current plan of care      Recommendations for follow up therapy are one component of a multi-disciplinary discharge planning process, led by the attending physician.  Recommendations may be updated based on patient status, additional functional criteria and insurance authorization.    Recommendations  Diet recommendations: Regular;Thin liquid Liquids provided via: Cup;Straw Medication Administration: Whole meds with liquid Supervision: Full supervision/cueing for compensatory strategies;Staff to assist with self feeding Compensations: Minimize environmental distractions;Slow rate;Small sips/bites Postural Changes and/or Swallow Maneuvers:  Seated upright 90 degrees;Upright 30-60 min after meal                  Oral care BID   Frequent or constant Supervision/Assistance Dysphagia, unspecified (R13.10)     Continue with current plan of care     Mahala Menghini., M.A. CCC-SLP Acute Rehabilitation Services Office [phone]  Secure chat preferred   10/17/2023, 4:02 PM

## 2023-10-27 ENCOUNTER — Ambulatory Visit: Payer: 59 | Admitting: Neurology

## 2023-10-27 ENCOUNTER — Telehealth: Payer: Self-pay | Admitting: *Deleted

## 2023-10-27 NOTE — Telephone Encounter (Signed)
 Received message from Lisa/phone room: "  I called and spoke w/ pt. He is requesting to r/s appt. Rescheduled with Dr. Epimenio Foot to 11/03/23 at 11:30am, check in 11:00am.  FYI- he has appt w/ PCP tomorrow at 11:00am.

## 2023-10-27 NOTE — Telephone Encounter (Signed)
 Marland Kitchen

## 2023-10-27 NOTE — Telephone Encounter (Signed)
 The below was done on 10/20/23

## 2023-10-27 NOTE — Telephone Encounter (Signed)
 Done on 10/21/23

## 2023-11-03 ENCOUNTER — Ambulatory Visit: Admitting: Neurology

## 2023-11-03 ENCOUNTER — Encounter: Payer: Self-pay | Admitting: Neurology

## 2023-11-25 ENCOUNTER — Emergency Department (HOSPITAL_COMMUNITY)

## 2023-11-25 ENCOUNTER — Inpatient Hospital Stay (HOSPITAL_COMMUNITY)
Admission: EM | Admit: 2023-11-25 | Discharge: 2023-11-28 | DRG: 640 | Disposition: A | Discharge status: Home Health | Attending: Internal Medicine | Admitting: Internal Medicine

## 2023-11-25 ENCOUNTER — Encounter (HOSPITAL_COMMUNITY): Payer: Self-pay | Admitting: Emergency Medicine

## 2023-11-25 ENCOUNTER — Other Ambulatory Visit: Payer: Self-pay

## 2023-11-25 DIAGNOSIS — Z6836 Body mass index (BMI) 36.0-36.9, adult: Secondary | ICD-10-CM | POA: Diagnosis not present

## 2023-11-25 DIAGNOSIS — R7401 Elevation of levels of liver transaminase levels: Secondary | ICD-10-CM | POA: Diagnosis not present

## 2023-11-25 DIAGNOSIS — G6181 Chronic inflammatory demyelinating polyneuritis: Secondary | ICD-10-CM | POA: Diagnosis present

## 2023-11-25 DIAGNOSIS — E872 Acidosis, unspecified: Secondary | ICD-10-CM | POA: Diagnosis present

## 2023-11-25 DIAGNOSIS — G9341 Metabolic encephalopathy: Secondary | ICD-10-CM | POA: Diagnosis present

## 2023-11-25 DIAGNOSIS — E871 Hypo-osmolality and hyponatremia: Secondary | ICD-10-CM | POA: Diagnosis not present

## 2023-11-25 DIAGNOSIS — Z825 Family history of asthma and other chronic lower respiratory diseases: Secondary | ICD-10-CM | POA: Diagnosis not present

## 2023-11-25 DIAGNOSIS — I6200 Nontraumatic subdural hemorrhage, unspecified: Secondary | ICD-10-CM | POA: Diagnosis present

## 2023-11-25 DIAGNOSIS — F10139 Alcohol abuse with withdrawal, unspecified: Secondary | ICD-10-CM | POA: Diagnosis present

## 2023-11-25 DIAGNOSIS — I10 Essential (primary) hypertension: Secondary | ICD-10-CM | POA: Diagnosis present

## 2023-11-25 DIAGNOSIS — D649 Anemia, unspecified: Secondary | ICD-10-CM | POA: Diagnosis present

## 2023-11-25 DIAGNOSIS — E860 Dehydration: Secondary | ICD-10-CM | POA: Diagnosis present

## 2023-11-25 DIAGNOSIS — Z7984 Long term (current) use of oral hypoglycemic drugs: Secondary | ICD-10-CM | POA: Diagnosis not present

## 2023-11-25 DIAGNOSIS — E876 Hypokalemia: Secondary | ICD-10-CM | POA: Diagnosis present

## 2023-11-25 DIAGNOSIS — S065XAA Traumatic subdural hemorrhage with loss of consciousness status unknown, initial encounter: Secondary | ICD-10-CM

## 2023-11-25 DIAGNOSIS — D75839 Thrombocytosis, unspecified: Secondary | ICD-10-CM

## 2023-11-25 DIAGNOSIS — M6282 Rhabdomyolysis: Secondary | ICD-10-CM | POA: Diagnosis present

## 2023-11-25 DIAGNOSIS — D72829 Elevated white blood cell count, unspecified: Secondary | ICD-10-CM | POA: Diagnosis present

## 2023-11-25 DIAGNOSIS — M109 Gout, unspecified: Secondary | ICD-10-CM | POA: Diagnosis present

## 2023-11-25 DIAGNOSIS — J449 Chronic obstructive pulmonary disease, unspecified: Secondary | ICD-10-CM | POA: Diagnosis present

## 2023-11-25 DIAGNOSIS — K7682 Hepatic encephalopathy: Secondary | ICD-10-CM | POA: Diagnosis not present

## 2023-11-25 DIAGNOSIS — E66812 Obesity, class 2: Secondary | ICD-10-CM

## 2023-11-25 DIAGNOSIS — E7220 Disorder of urea cycle metabolism, unspecified: Secondary | ICD-10-CM | POA: Diagnosis not present

## 2023-11-25 DIAGNOSIS — Z888 Allergy status to other drugs, medicaments and biological substances status: Secondary | ICD-10-CM

## 2023-11-25 DIAGNOSIS — Z79899 Other long term (current) drug therapy: Secondary | ICD-10-CM | POA: Diagnosis not present

## 2023-11-25 DIAGNOSIS — F17210 Nicotine dependence, cigarettes, uncomplicated: Secondary | ICD-10-CM | POA: Diagnosis present

## 2023-11-25 LAB — BASIC METABOLIC PANEL WITH GFR
Anion gap: 15 (ref 5–15)
BUN: 5 mg/dL — ABNORMAL LOW (ref 6–20)
CO2: 17 mmol/L — ABNORMAL LOW (ref 22–32)
Calcium: 7.4 mg/dL — ABNORMAL LOW (ref 8.9–10.3)
Chloride: 86 mmol/L — ABNORMAL LOW (ref 98–111)
Creatinine, Ser: 0.77 mg/dL (ref 0.61–1.24)
GFR, Estimated: >60 mL/min (ref >60)
Glucose, Bld: 95 mg/dL (ref 70–99)
Potassium: 3.6 mmol/L (ref 3.5–5.1)
Sodium: 118 mmol/L — CL (ref 135–145)

## 2023-11-25 LAB — URINALYSIS, ROUTINE W REFLEX MICROSCOPIC
Bacteria, UA: NONE SEEN
Bilirubin Urine: NEGATIVE
Glucose, UA: NEGATIVE mg/dL
Ketones, ur: NEGATIVE mg/dL
Leukocytes,Ua: NEGATIVE
Nitrite: NEGATIVE
Protein, ur: NEGATIVE mg/dL
Specific Gravity, Urine: 1.016 (ref 1.005–1.030)
pH: 6 (ref 5.0–8.0)

## 2023-11-25 LAB — ETHANOL: Alcohol, Ethyl (B): <15 mg/dL (ref <15)

## 2023-11-25 LAB — CBC WITH DIFFERENTIAL/PLATELET
Abs Immature Granulocytes: 0.09 10*3/uL — ABNORMAL HIGH (ref 0.00–0.07)
Basophils Absolute: 0 10*3/uL (ref 0.0–0.1)
Basophils Relative: 0 %
Eosinophils Absolute: 0 10*3/uL (ref 0.0–0.5)
Eosinophils Relative: 0 %
HCT: 33.3 % — ABNORMAL LOW (ref 39.0–52.0)
Hemoglobin: 11.6 g/dL — ABNORMAL LOW (ref 13.0–17.0)
Immature Granulocytes: 1 %
Lymphocytes Relative: 6 %
Lymphs Abs: 1.2 10*3/uL (ref 0.7–4.0)
MCH: 32.8 pg (ref 26.0–34.0)
MCHC: 34.8 g/dL (ref 30.0–36.0)
MCV: 94.1 fL (ref 80.0–100.0)
Monocytes Absolute: 0.9 10*3/uL (ref 0.1–1.0)
Monocytes Relative: 5 %
Neutro Abs: 16.2 10*3/uL — ABNORMAL HIGH (ref 1.7–7.7)
Neutrophils Relative %: 88 %
Platelets: 473 10*3/uL — ABNORMAL HIGH (ref 150–400)
RBC: 3.54 MIL/uL — ABNORMAL LOW (ref 4.22–5.81)
RDW: 15.7 % — ABNORMAL HIGH (ref 11.5–15.5)
WBC: 18.3 10*3/uL — ABNORMAL HIGH (ref 4.0–10.5)
nRBC: 0 % (ref 0.0–0.2)

## 2023-11-25 LAB — COMPREHENSIVE METABOLIC PANEL WITH GFR
ALT: 182 U/L — ABNORMAL HIGH (ref 0–44)
AST: 788 U/L — ABNORMAL HIGH (ref 15–41)
Albumin: 2 g/dL — ABNORMAL LOW (ref 3.5–5.0)
Alkaline Phosphatase: 250 U/L — ABNORMAL HIGH (ref 38–126)
Anion gap: 17 — ABNORMAL HIGH (ref 5–15)
BUN: 6 mg/dL (ref 6–20)
CO2: 16 mmol/L — ABNORMAL LOW (ref 22–32)
Calcium: 7.5 mg/dL — ABNORMAL LOW (ref 8.9–10.3)
Chloride: 82 mmol/L — ABNORMAL LOW (ref 98–111)
Creatinine, Ser: 0.95 mg/dL (ref 0.61–1.24)
GFR, Estimated: >60 mL/min (ref >60)
Glucose, Bld: 113 mg/dL — ABNORMAL HIGH (ref 70–99)
Potassium: 3.5 mmol/L (ref 3.5–5.1)
Sodium: 115 mmol/L — CL (ref 135–145)
Total Bilirubin: 5.8 mg/dL — ABNORMAL HIGH (ref 0.0–1.2)
Total Protein: 6.2 g/dL — ABNORMAL LOW (ref 6.5–8.1)

## 2023-11-25 LAB — PROTIME-INR
INR: 1.9 — ABNORMAL HIGH (ref 0.8–1.2)
Prothrombin Time: 21.8 s — ABNORMAL HIGH (ref 11.4–15.2)

## 2023-11-25 LAB — MAGNESIUM: Magnesium: 1.3 mg/dL — ABNORMAL LOW (ref 1.7–2.4)

## 2023-11-25 LAB — AMMONIA: Ammonia: 85 umol/L — ABNORMAL HIGH (ref 9–35)

## 2023-11-25 LAB — LIPASE, BLOOD: Lipase: 32 U/L (ref 11–51)

## 2023-11-25 MED ORDER — MAGNESIUM SULFATE 2 GM/50ML IV SOLN
2.0000 g | Freq: Once | INTRAVENOUS | Status: AC
  Administered 2023-11-26: 2 g via INTRAVENOUS
  Filled 2023-11-25: qty 50

## 2023-11-25 MED ORDER — FOLIC ACID 1 MG PO TABS: 1.0000 mg | ORAL_TABLET | Freq: Once | ORAL | Status: DC

## 2023-11-25 MED ORDER — LACTULOSE 10 GM/15ML PO SOLN
20.0000 g | Freq: Three times a day (TID) | ORAL | Status: AC
Start: 2023-11-26 — End: 2023-11-27
  Administered 2023-11-26 (×3): 20 g via ORAL
  Filled 2023-11-25 (×3): qty 30

## 2023-11-25 MED ORDER — SODIUM CHLORIDE 0.9 % IV SOLN: INTRAVENOUS | Status: AC

## 2023-11-25 MED ORDER — LACTULOSE 10 GM/15ML PO SOLN
20.0000 g | Freq: Once | ORAL | Status: AC
  Administered 2023-11-25: 20 g via ORAL
  Filled 2023-11-25: qty 30

## 2023-11-25 MED ORDER — THIAMINE MONONITRATE 100 MG PO TABS
100.0000 mg | ORAL_TABLET | Freq: Every day | ORAL | Status: DC
  Administered 2023-11-26 – 2023-11-28 (×3): 100 mg via ORAL
  Filled 2023-11-25 (×3): qty 1

## 2023-11-25 MED ORDER — FENTANYL CITRATE PF 50 MCG/ML IJ SOSY
50.0000 ug | PREFILLED_SYRINGE | Freq: Once | INTRAMUSCULAR | Status: AC
  Administered 2023-11-25: 50 ug via INTRAVENOUS
  Filled 2023-11-25: qty 1

## 2023-11-25 MED ORDER — NICOTINE 21 MG/24HR TD PT24
21.0000 mg | MEDICATED_PATCH | Freq: Once | TRANSDERMAL | Status: AC
  Administered 2023-11-26: 21 mg via TRANSDERMAL
  Filled 2023-11-25: qty 1

## 2023-11-25 MED ORDER — CHLORHEXIDINE GLUCONATE CLOTH 2 % EX PADS
6.0000 | MEDICATED_PAD | Freq: Every day | CUTANEOUS | Status: DC
  Administered 2023-11-25 – 2023-11-28 (×3): 6 via TOPICAL

## 2023-11-25 MED ORDER — LORAZEPAM 2 MG/ML IJ SOLN
0.5000 mg | Freq: Once | INTRAMUSCULAR | Status: AC
  Administered 2023-11-25: 0.5 mg via INTRAVENOUS
  Filled 2023-11-25: qty 1

## 2023-11-25 MED ORDER — LACTATED RINGERS IV BOLUS
1000.0000 mL | Freq: Once | INTRAVENOUS | Status: AC
  Administered 2023-11-25: 1000 mL via INTRAVENOUS

## 2023-11-25 MED ORDER — PROCHLORPERAZINE EDISYLATE 10 MG/2ML IJ SOLN: 10.0000 mg | Freq: Four times a day (QID) | INTRAMUSCULAR | Status: DC | PRN

## 2023-11-25 MED ORDER — DEXMEDETOMIDINE HCL IN NACL 400 MCG/100ML IV SOLN
0.0000 ug/kg/h | INTRAVENOUS | Status: DC
  Administered 2023-11-25: 0.4 ug/kg/h via INTRAVENOUS
  Administered 2023-11-26 (×4): 1.2 ug/kg/h via INTRAVENOUS
  Filled 2023-11-25 (×6): qty 100

## 2023-11-25 MED ORDER — HALOPERIDOL LACTATE 5 MG/ML IJ SOLN
2.0000 mg | Freq: Once | INTRAMUSCULAR | Status: AC
  Administered 2023-11-25: 2 mg via INTRAVENOUS
  Filled 2023-11-25: qty 1

## 2023-11-25 NOTE — ED Triage Notes (Signed)
 Pt fell on steps and is more confused than normal per friend. Pt c/o pain all over and tingling in hands and feet.

## 2023-11-25 NOTE — ED Notes (Signed)
 Pt rocking back and forth and shaking during triage while attempting vs. Pt requesting pain medications while attempting to get vs.

## 2023-11-25 NOTE — H&P (Addendum)
 History and Physical    Patient: Connor Copeland:[id number] DOB: 12/28/1975 DOA: 11/25/2023 DOS: the patient was seen and examined on 11/25/2023 PCP: Connor Bickers, MD  Patient coming from: Home  Chief Complaint:  Chief Complaint  Patient presents with   Fall   HPI: Connor Copeland is a 48 y.o. male with medical history significant of chronic inflammatory demyelinating polyneuropathy, alcohol  disorder seizure, gout who presents to the emergency department from home via EMS due to a fall sustained at home.  Patient was confused and was unable to provide history, history was obtained from ED physician and ED medical record..  Apparently, patient sustained a mechanical fall and denies hitting his head with complaint of hands and feet pain from his chronic inflammatory condition.  ED Course:  In the emergency department, pulse was 110 bpm, BP 123/100, other vital signs are within normal range.  Workup in the ED showed WBC of 15.3, normocytic anemia and thrombocytosis.  BMP showed sodium of 115, potassium 3.5, chloride 92, bicarb 16, blood glucose 113, BUN 6, creatinine 0.95.  Albumin  2.0, AST 78, ALT 182, ALP 250, total bilirubin 5.8, anion gap 17, urinalysis was normal.  Ammonia 85, magnesium  1.3 CT head without contrast showed right frontal subdural collection concerning for an acute subdural hemorrhage on prior hygroma with midline shift. CT cervical spine without contrast showed no acute/traumatic cervical spine pathology Neurosurgery was consulted and recommended repeat CT scan in 8 hours Nephrologist (Dr. Austine Copeland) was consulted and recommended rechecking patient's sodium after the 1 L of LR provided. Hospitalist was asked to admit patient for further evaluation and management.  Review of Systems: Review of systems as noted in the HPI. All other systems reviewed and are negative.   Past Medical History:  Diagnosis Date   CIDP (chronic inflammatory demyelinating  polyneuropathy) (HCC)    ETOH abuse    Gout    Past Surgical History:  Procedure Laterality Date   FOOT SURGERY      Social History:  reports that he has been smoking cigarettes. He has never used smokeless tobacco. He reports current alcohol  use of about 42.0 standard drinks of alcohol  per week. He reports that he does not use drugs.   Allergies  Allergen Reactions   Neurontin [Gabapentin] Swelling    Leg swelling    Family History  Problem Relation Age of Onset   COPD Father      Prior to Admission medications   Medication Sig Start Date End Date Taking? Authorizing Provider  acetaminophen  (TYLENOL ) 325 MG tablet Take 2 tablets (650 mg total) by mouth every 6 (six) hours as needed for mild pain (pain score 1-3) (or Fever >/= 101). 08/21/23   Copeland, Connor Hockey, PA-C  amLODipine  (NORVASC ) 2.5 MG tablet Take 1 tablet (2.5 mg total) by mouth daily. 08/25/23   Copeland, Connor Hockey, PA-C  diltiazem  (CARDIZEM  CD) 120 MG 24 hr capsule Take 1 capsule (120 mg total) by mouth daily. 08/25/23   Copeland, Connor Hockey, PA-C  DULoxetine  (CYMBALTA ) 60 MG capsule Take 2 capsules (120 mg total) by mouth daily. 08/25/23   Copeland, Connor Hockey, PA-C  fluticasone (FLONASE) 50 MCG/ACT nasal spray Place 1 spray into both nostrils daily. 07/15/23   [provider]  folic acid  (FOLVITE ) 1 MG tablet Take 1 tablet (1 mg total) by mouth daily. 08/25/23   Copeland, Connor Hockey, PA-C  HYDROcodone -acetaminophen  (NORCO) 7.5-325 MG tablet Take 1 tablet by mouth 3 (three) times daily as needed. 08/25/23  Copeland, Connor Hockey, PA-C  lactulose  (CHRONULAC ) 10 GM/15ML solution Take 30 mLs (20 g total) by mouth daily. 10/18/23   Connor Savoy, MD  linagliptin  (TRADJENTA ) 5 MG TABS tablet Take 1 tablet (5 mg total) by mouth daily. 08/25/23   Copeland, Connor Hockey, PA-C  magnesium  oxide (MAG-OX) 400 (240 Mg) MG tablet Take 1 tablet (400 mg total) by mouth 2 (two) times daily. 08/25/23   Copeland, Connor Hockey, PA-C  metFORMIN   (GLUCOPHAGE ) 500 MG tablet Take 500 mg by mouth 2 (two) times daily. 08/29/23   [provider]  Multiple Vitamin (MULTIVITAMIN WITH MINERALS) TABS tablet Take 1 tablet by mouth daily. 08/21/23   Copeland, Connor Hockey, PA-C  nicotine  (NICODERM CQ  - DOSED IN MG/24 HOURS) 21 mg/24hr patch 21 mg patch daily for 1 week then 14 mg patch daily for 3 weeks then 7 mg patch daily for 3 weeks and stop 08/25/23   Copeland, Connor Hockey, PA-C  nortriptyline  (PAMELOR ) 25 MG capsule Take 2 capsules (50 mg total) by mouth at bedtime. 08/25/23   Copeland, Connor Hockey, PA-C  Oxcarbazepine  (TRILEPTAL ) 300 MG tablet 300 milligrams daily and 600 mg nightly 08/25/23   Copeland, Connor Hockey, PA-C  pantoprazole  (PROTONIX ) 40 MG tablet Take 1 tablet (40 mg total) by mouth daily. 08/25/23   Copeland, Connor Hockey, PA-C  predniSONE  (DELTASONE ) 20 MG tablet Take 20 mg by mouth daily. 11/25/23   [provider]  thiamine  (VITAMIN B1) 100 MG tablet Take 1 tablet (100 mg total) by mouth daily. 08/25/23   Copeland, Connor Hockey, PA-C  febuxostat  (ULORIC ) 40 MG tablet Take 1 tablet (40 mg total) by mouth daily. 08/25/23   Copeland, Connor Hockey, PA-C    Physical Exam: BP (!) 179/120 (BP Location: Right Arm)   Pulse (!) 109   Temp 97.9 F (36.6 C) (Axillary)   Resp 19   Ht 5\' 8"  (1.727 m)   Wt 108 kg   SpO2 100%   BMI 36.20 kg/m   General: 48 y.o. year-old male well developed well nourished in no acute distress.  Alert and oriented x3. HEENT: NCAT, EOMI Neck: Supple, trachea medial Cardiovascular: Regular rate and rhythm with no rubs or gallops.  No thyromegaly or JVD noted.  No lower extremity edema. 2/4 pulses in all 4 extremities. Respiratory: Clear to auscultation with no wheezes or rales. Good inspiratory effort. Abdomen: Soft, nontender nondistended with normal bowel sounds x4 quadrants. Muskuloskeletal: No cyanosis, clubbing or edema noted bilaterally Neuro: CN II-XII intact, strength 5/5 x 4, sensation, reflexes intact Skin:  No ulcerative lesions noted or rashes Psychiatry: Judgement and insight appear normal. Mood is appropriate for condition and setting          Labs on Admission:  Basic Metabolic Panel: Recent Labs  Lab 11/25/23 1720  NA 115*  K 3.5  CL 82*  CO2 16*  GLUCOSE 113*  BUN 6  CREATININE 0.95  CALCIUM  7.5*  MG 1.3*   Liver Function Tests: Recent Labs  Lab 11/25/23 1720  AST 788*  ALT 182*  ALKPHOS 250*  BILITOT 5.8*  PROT 6.2*  ALBUMIN  2.0*   Recent Labs  Lab 11/25/23 1720  LIPASE 32   Recent Labs  Lab 11/25/23 1746  AMMONIA 85*   CBC: Recent Labs  Lab 11/25/23 1720  WBC 18.3*  NEUTROABS 16.2*  HGB 11.6*  HCT 33.3*  MCV 94.1  PLT 473*   Cardiac Enzymes: No results for input(s): "CKTOTAL", "CKMB", "CKMBINDEX", "TROPONINI" in the last  168 hours.  BNP (last 3 results) Recent Labs    12/12/22 2040  BNP 107.0*    ProBNP (last 3 results) No results for input(s): "PROBNP" in the last 8760 hours.  CBG: No results for input(s): "GLUCAP" in the last 168 hours.  Radiological Exams on Admission: CT Head Wo Contrast Result Date: 11/25/2023 CLINICAL DATA:  Head trauma. EXAM: CT HEAD WITHOUT CONTRAST CT CERVICAL SPINE WITHOUT CONTRAST TECHNIQUE: Multidetector CT imaging of the head and cervical spine was performed following the standard protocol without intravenous contrast. Multiplanar CT image reconstructions of the cervical spine were also generated. RADIATION DOSE REDUCTION: This exam was performed according to the departmental dose-optimization program which includes automated exposure control, adjustment of the mA and/or kV according to patient size and/or use of iterative reconstruction technique. COMPARISON:  CT dated 10/02/2023. FINDINGS: Evaluation is very limited due to severe motion artifact. 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 Smaller left frontal subdural collection. There is mild mass effect on the right frontal lobe parenchyma. No significant midline shift. Vascular: No hyperdense vessel or unexpected calcification. Skull: Normal. Negative for fracture or focal lesion. Sinuses/Orbits: The visualized paranasal sinuses and the left mastoid air cells are clear. Mild right mastoid effusion. Other: None CT CERVICAL SPINE FINDINGS Alignment: No acute subluxation. Skull base and vertebrae: No acute fracture. Soft tissues and spinal canal: No prevertebral fluid or swelling. No visible canal hematoma. Disc levels:  No acute findings. Upper chest: Negative. Other: None IMPRESSION: 1. 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 Smaller left frontal subdural collection. No significant midline shift. 2. No acute/traumatic cervical spine pathology. These results were called by telephone at the time of interpretation on 11/25/2023 at 5:43 pm to provider ANDREW TEE , who verbally acknowledged these results. Electronically Signed   By: Angus Bark M.D.   On: 11/25/2023 17:47   CT Cervical Spine Wo Contrast Result Date: 11/25/2023 CLINICAL DATA:  Head trauma. EXAM: CT HEAD WITHOUT CONTRAST CT CERVICAL SPINE WITHOUT CONTRAST TECHNIQUE: Multidetector CT imaging of the head and cervical spine was performed following the standard protocol without intravenous contrast. Multiplanar CT image reconstructions of the cervical spine were also generated. RADIATION DOSE REDUCTION: This exam was performed according to the departmental dose-optimization program which includes automated exposure control, adjustment of the mA and/or kV according to patient size and/or use of iterative reconstruction technique. COMPARISON:  CT dated 10/02/2023. FINDINGS: Evaluation is very limited due to severe motion artifact. 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 Smaller left frontal subdural collection. There is mild mass effect on the right frontal lobe parenchyma. No significant midline shift. Vascular: No hyperdense vessel or unexpected calcification. Skull: Normal. Negative for fracture or focal lesion. Sinuses/Orbits: The visualized paranasal sinuses and the left mastoid air cells are clear. Mild right mastoid effusion. Other: None CT CERVICAL SPINE FINDINGS Alignment: No acute subluxation. Skull base and vertebrae: No acute fracture. Soft tissues and spinal canal: No prevertebral fluid or swelling. No visible canal hematoma. Disc levels:  No acute findings. Upper chest: Negative. Other: None IMPRESSION: 1. 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 Smaller left frontal subdural collection.  No significant midline shift. 2. No acute/traumatic cervical spine pathology. These results were called by telephone at the time of interpretation on 11/25/2023 at 5:43 pm to provider ANDREW TEE , who verbally acknowledged these results. Electronically Signed   By: Angus Bark M.D.   On: 11/25/2023 17:47    EKG: I independently viewed the EKG done and my findings are as followed: Sinus tachycardia at a rate of 108 bpm  Assessment/Plan Present on Admission:  Hyponatremia  Principal Problem:   Hyponatremia Active Problems:   Subdural hematoma (HCC)   Hepatic encephalopathy (HCC)   Hyperammonemia (HCC)   Hypomagnesemia   Transaminitis   Leukocytosis   Thrombocytosis   Obesity, Class II, BMI 35-39.9  Hyponatremia This may be due to beer potomania Continue gentle hydration Continue to monitor sodium with serial BMPs Urine osmolality, serum osmolality and urine sodium will be checked  Subdural hematoma CT head without contrast showed  right frontal subdural collection  CT head will be repeated 8 hours after 1st CT to ensure no worsening subdural hematoma per Neurosurgery recommendation  Hepatic encephalopathy Hyperammonemia Alcohol  withdrawal disorder Ammonia level was 85, continue lactulose  Patient was started on Precedex  drip Continue fall precautions, delirium precaution  Hypomagnesemia 1.3, this was replenished Fall at home  Transaminitis AST 788, ALT 182, ALP 250 This may be related to patient's history of alcohol  abuse Hepatitis panel will be checked Consider RUQ ultrasound for worsening liver enzymes  Leukocytosis, thrombocytosis possibly reactive WBC 18.3, platelets 473 Continue to monitor CBC with morning labs  Obesity class II (BMI 36.20) Patient will be consulted on diet and lifestyle modification when more alert   DVT prophylaxis: SCDs  Code Status: Full code  Family Communication: Mother at bedside (questions answered to satisfaction)  Consults: None  Severity of Illness: The appropriate patient status for this patient is INPATIENT. Inpatient status is judged to be reasonable and necessary in order to provide the required intensity of service to ensure the patient's safety. The patient's presenting symptoms, physical exam findings, and initial radiographic and laboratory data in the context of their chronic comorbidities is felt to place them at high risk for further clinical deterioration. Furthermore, it is not anticipated that the patient will be medically stable for discharge from the hospital within 2 midnights of admission.   * I certify that at the point of admission it is my clinical judgment that the patient will require inpatient hospital care spanning beyond 2 midnights from the point of admission due to high intensity of service, high risk for further deterioration and high frequency of surveillance required.*  Author: Royalty Domagala, DO 11/25/2023 10:55 PM  For on call review  [email or web address].

## 2023-11-25 NOTE — ED Provider Notes (Signed)
 [place] EMERGENCY DEPARTMENT AT [facility] Provider Note   CSN: [id number] Arrival date & time: 11/25/23  1649     History  Chief Complaint  Patient presents with   Connor Copeland    Connor Copeland is a 48 y.o. male.  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  The patient states this is a mechanical fall.  There were reports from one of the patient's friends that he was more confused than normal.  The patient is alert and oriented on arrival here.  He denies hitting his head and states that he does remember slipping and falling.  He denies any injuries from this.  He states he is having pain in his hands and feet from his chronic inflammatory condition.   Fall       Home Medications Prior to Admission medications   Medication Sig Start Date End Date Taking? Authorizing Provider  acetaminophen  (TYLENOL ) 325 MG tablet Take 2 tablets (650 mg total) by mouth every 6 (six) hours as needed for mild pain (pain score 1-3) (or Fever >/= 101). 08/21/23   Angiulli, Everlyn Hockey, PA-C  amLODipine  (NORVASC ) 2.5 MG tablet Take 1 tablet (2.5 mg total) by mouth daily. 08/25/23   Angiulli, Everlyn Hockey, PA-C  diltiazem  (CARDIZEM  CD) 120 MG 24 hr capsule Take 1 capsule (120 mg total) by mouth daily. 08/25/23   Angiulli, Everlyn Hockey, PA-C  DULoxetine  (CYMBALTA ) 60 MG capsule Take 2 capsules (120 mg total) by mouth daily. 08/25/23   Angiulli, Everlyn Hockey, PA-C  fluticasone (FLONASE) 50 MCG/ACT nasal spray Place 1 spray into both nostrils daily. 07/15/23   [provider]  folic acid  (FOLVITE ) 1 MG tablet Take 1 tablet (1 mg total) by mouth daily. 08/25/23   Angiulli, Everlyn Hockey, PA-C  HYDROcodone -acetaminophen  (NORCO) 7.5-325 MG tablet Take 1 tablet by mouth 3 (three) times daily as needed. 08/25/23   Angiulli, Everlyn Hockey, PA-C  lactulose  (CHRONULAC ) 10 GM/15ML solution Take 30 mLs (20 g total)  by mouth daily. 10/18/23   Macdonald Savoy, MD  linagliptin  (TRADJENTA ) 5 MG TABS tablet Take 1 tablet (5 mg total) by mouth daily. 08/25/23   Angiulli, Everlyn Hockey, PA-C  magnesium  oxide (MAG-OX) 400 (240 Mg) MG tablet Take 1 tablet (400 mg total) by mouth 2 (two) times daily. 08/25/23   Angiulli, Everlyn Hockey, PA-C  metFORMIN  (GLUCOPHAGE ) 500 MG tablet Take 500 mg by mouth 2 (two) times daily. 08/29/23   [provider]  Multiple Vitamin (MULTIVITAMIN WITH MINERALS) TABS tablet Take 1 tablet by mouth daily. 08/21/23   Angiulli, Everlyn Hockey, PA-C  nicotine  (NICODERM CQ  - DOSED IN MG/24 HOURS) 21 mg/24hr patch 21 mg patch daily for 1 week then 14 mg patch daily for 3 weeks then 7 mg patch daily for 3 weeks and stop 08/25/23   Angiulli, Everlyn Hockey, PA-C  nortriptyline  (PAMELOR ) 25 MG capsule Take 2 capsules (50 mg total) by mouth at bedtime. 08/25/23   Angiulli, Everlyn Hockey, PA-C  Oxcarbazepine  (TRILEPTAL ) 300 MG tablet 300 milligrams daily and 600 mg nightly 08/25/23   Angiulli, Everlyn Hockey, PA-C  pantoprazole  (PROTONIX ) 40 MG tablet Take 1 tablet (40 mg total) by mouth daily. 08/25/23   Angiulli, Everlyn Hockey, PA-C  thiamine  (VITAMIN B1) 100 MG tablet Take 1 tablet (100 mg total) by mouth daily. 08/25/23   Angiulli, Everlyn Hockey, PA-C  febuxostat  (ULORIC ) 40 MG tablet Take  1 tablet (40 mg total) by mouth daily. 08/25/23   Angiulli, Everlyn Hockey, PA-C      Allergies    Neurontin [gabapentin]    Review of Systems   Review of Systems  Musculoskeletal:  Positive for arthralgias.  All other systems reviewed and are negative.   Physical Exam Updated Vital Signs BP (!) 123/100 (BP Location: Right Arm)   Pulse (!) 110   Temp 98 F (36.7 C) (Oral)   Resp 18   Ht 5\' 8"  (1.727 m)   Wt 108 kg   SpO2 100%   BMI 36.20 kg/m  Physical Exam Vitals and nursing note reviewed.   Gen: NAD, chronically ill appearing Eyes: PERRL, EOMI, ? Mild scleral icterus HEENT: no oropharyngeal swelling, dry mucous membranes Neck:  trachea midline Resp: clear to auscultation bilaterally Card: RRR, no murmurs, rubs, or gallops Abd: nontender, nondistended Extremities: no calf tenderness, no edema Vascular: 2+ radial pulses bilaterally, 2+ DP pulses bilaterally Skin: chronic appearing bruises noted over the lower extremities Neuro: no focal deficits, AAO X 3 Psyc: acting appropriately   ED Results / Procedures / Treatments   Labs (all labs ordered are listed, but only abnormal results are displayed) Labs Reviewed  CBC WITH DIFFERENTIAL/PLATELET - Abnormal; Notable for the following components:      Result Value   WBC 18.3 (*)    RBC 3.54 (*)    Hemoglobin 11.6 (*)    HCT 33.3 (*)    RDW 15.7 (*)    Platelets 473 (*)    Neutro Abs 16.2 (*)    Abs Immature Granulocytes 0.09 (*)    All other components within normal limits  COMPREHENSIVE METABOLIC PANEL WITH GFR - Abnormal; Notable for the following components:   Sodium 115 (*)    Chloride 82 (*)    CO2 16 (*)    Glucose, Bld 113 (*)    Calcium  7.5 (*)    Total Protein 6.2 (*)    Albumin  2.0 (*)    AST 788 (*)    ALT 182 (*)    Alkaline Phosphatase 250 (*)    Total Bilirubin 5.8 (*)    Anion gap 17 (*)    All other components within normal limits  MAGNESIUM  - Abnormal; Notable for the following components:   Magnesium  1.3 (*)    All other components within normal limits  AMMONIA - Abnormal; Notable for the following components:   Ammonia 85 (*)    All other components within normal limits  PROTIME-INR - Abnormal; Notable for the following components:   Prothrombin Time 21.8 (*)    INR 1.9 (*)    All other components within normal limits  LIPASE, BLOOD  ETHANOL  URINALYSIS, ROUTINE W REFLEX MICROSCOPIC    EKG None  Radiology CT Head Wo Contrast Result Date: 11/25/2023 CLINICAL DATA:  Head trauma. EXAM: CT HEAD WITHOUT CONTRAST CT CERVICAL SPINE WITHOUT CONTRAST TECHNIQUE: Multidetector CT imaging of the head and cervical spine was performed  following the standard protocol without intravenous contrast. Multiplanar CT image reconstructions of the cervical spine were also generated. RADIATION DOSE REDUCTION: This exam was performed according to the departmental dose-optimization program which includes automated exposure control, adjustment of the mA and/or kV according to patient size and/or use of iterative reconstruction technique. COMPARISON:  CT dated 10/02/2023. FINDINGS: Evaluation is very limited due to severe motion artifact. CT HEAD FINDINGS Brain: Right frontal subdural collection measures approximately 8 mm in thickness and demonstrates a higher attenuation than  the prior CT concerning for an acute subdural hemorrhage on prior hygroma. Smaller left frontal subdural collection. There is mild mass effect on the right frontal lobe parenchyma. No significant midline shift. Vascular: No hyperdense vessel or unexpected calcification. Skull: Normal. Negative for fracture or focal lesion. Sinuses/Orbits: The visualized paranasal sinuses and the left mastoid air cells are clear. Mild right mastoid effusion. Other: None CT CERVICAL SPINE FINDINGS Alignment: No acute subluxation. Skull base and vertebrae: No acute fracture. Soft tissues and spinal canal: No prevertebral fluid or swelling. No visible canal hematoma. Disc levels:  No acute findings. Upper chest: Negative. Other: None IMPRESSION: 1. 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 Smaller left frontal subdural collection. No significant midline shift. 2. No acute/traumatic cervical spine pathology. These results were called by telephone at the time of interpretation on 11/25/2023 at 5:43 pm to provider Jeyden Coffelt , who verbally acknowledged these results. Electronically Signed   By: Angus Bark M.D.   On: 11/25/2023 17:47   CT Cervical Spine Wo Contrast Result Date: 11/25/2023 CLINICAL DATA:  Head trauma. EXAM: CT HEAD WITHOUT CONTRAST CT  CERVICAL SPINE WITHOUT CONTRAST TECHNIQUE: Multidetector CT imaging of the head and cervical spine was performed following the standard protocol without intravenous contrast. Multiplanar CT image reconstructions of the cervical spine were also generated. RADIATION DOSE REDUCTION: This exam was performed according to the departmental dose-optimization program which includes automated exposure control, adjustment of the mA and/or kV according to patient size and/or use of iterative reconstruction technique. COMPARISON:  CT dated 10/02/2023. FINDINGS: Evaluation is very limited due to severe motion artifact. CT HEAD FINDINGS Brain: Right frontal subdural collection measures approximately 8 mm in thickness and demonstrates a higher attenuation than the prior CT concerning for an acute subdural hemorrhage on prior hygroma. Smaller left frontal subdural collection. There is mild mass effect on the right frontal lobe parenchyma. No significant midline shift. Vascular: No hyperdense vessel or unexpected calcification. Skull: Normal. Negative for fracture or focal lesion. Sinuses/Orbits: The visualized paranasal sinuses and the left mastoid air cells are clear. Mild right mastoid effusion. Other: None CT CERVICAL SPINE FINDINGS Alignment: No acute subluxation. Skull base and vertebrae: No acute fracture. Soft tissues and spinal canal: No prevertebral fluid or swelling. No visible canal hematoma. Disc levels:  No acute findings. Upper chest: Negative. Other: None IMPRESSION: 1. 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 Smaller left frontal subdural collection. No significant midline shift. 2. No acute/traumatic cervical spine pathology. These results were called by telephone at the time of interpretation on 11/25/2023 at 5:43 pm to provider Shanterica Biehler , who verbally acknowledged these results. Electronically Signed   By: Angus Bark M.D.   On: 11/25/2023 17:47     Procedures Procedures    Medications Ordered in ED Medications  thiamine  (VITAMIN B1) tablet 100 mg (100 mg Oral Patient Refused/Not Given 11/25/23 1809)  folic acid  (FOLVITE ) tablet 1 mg (1 mg Oral Patient Refused/Not Given 11/25/23 1809)  lactulose  (CHRONULAC ) 10 GM/15ML solution 20 g (has no administration in time range)  lactated ringers  bolus 1,000 mL (1,000 mLs Intravenous New Bag/Given 11/25/23 1800)  fentaNYL  (SUBLIMAZE ) injection 50 mcg (50 mcg Intravenous Given 11/25/23 1800)    ED Course/ Medical Decision Making/ A&P  Medical Decision Making 48 year old male with past medical history of alcohol  abuse and COPD presents emergency department today after a fall at home.  The patient seems to be alert and oriented here.  Will obtain a CT scan of his head and cervical spine to evaluate for acute traumatic injuries.  The patient's exam is reassuring and I do not appreciate any obvious traumatic injuries on exam.  Will obtain basic labs here to evaluate for anemia or electrolyte abnormalities.  Also obtain an alcohol  level although patient does not appear to be clinically intoxicated at this time.  Will give him a dose of fentanyl  for pain as well as some IV fluids as he is tachycardic here on arrival.  The patient was found to have a subdural hematoma with no midline shift.  This was discussed with neurosurgery and they recommended repeat CT scan in 8 hours.  The patient's ammonia level is elevated here.  Lactulose  is ordered.  His sodium did come back low at 115.  I spoke with Dr. Austine Blunt from nephrology.  He recommended rechecking the patient's sodium after the liter of LR that was ordered when he initially arrived and to call back if this was lower.  He felt that the patient could stay here at [facility] for further evaluation management.  Calls placed to hospitalist service for admission.  CRITICAL CARE Performed by: Carin Charleston   Total critical care  time: 40 minutes  Critical care time was exclusive of separately billable procedures and treating other patients.  Critical care was necessary to treat or prevent imminent or life-threatening deterioration.  Critical care was time spent personally by me on the following activities: development of treatment plan with patient and/or surrogate as well as nursing, discussions with consultants, evaluation of patient's response to treatment, examination of patient, obtaining history from patient or surrogate, ordering and performing treatments and interventions, ordering and review of laboratory studies, ordering and review of radiographic studies, pulse oximetry and re-evaluation of patient's condition.   Amount and/or Complexity of Data Reviewed Labs: ordered. Radiology: ordered.  Risk OTC drugs. Prescription drug management. Decision regarding hospitalization.           Final Clinical Impression(s) / ED Diagnoses Final diagnoses:  Hyponatremia  Hepatic encephalopathy (HCC)  Subdural hematoma (HCC)    Rx / DC Orders ED Discharge Orders     None         Carin Charleston, MD 11/25/23 [phone]

## 2023-11-26 ENCOUNTER — Ambulatory Visit (HOSPITAL_COMMUNITY)

## 2023-11-26 ENCOUNTER — Inpatient Hospital Stay

## 2023-11-26 ENCOUNTER — Inpatient Hospital Stay: Admitting: Oncology

## 2023-11-26 ENCOUNTER — Encounter (HOSPITAL_COMMUNITY): Admitting: Occupational Therapy

## 2023-11-26 ENCOUNTER — Inpatient Hospital Stay (HOSPITAL_COMMUNITY)

## 2023-11-26 DIAGNOSIS — S065XAA Traumatic subdural hemorrhage with loss of consciousness status unknown, initial encounter: Secondary | ICD-10-CM | POA: Diagnosis not present

## 2023-11-26 DIAGNOSIS — E871 Hypo-osmolality and hyponatremia: Secondary | ICD-10-CM | POA: Diagnosis not present

## 2023-11-26 DIAGNOSIS — K7682 Hepatic encephalopathy: Secondary | ICD-10-CM | POA: Diagnosis not present

## 2023-11-26 DIAGNOSIS — E66812 Obesity, class 2: Secondary | ICD-10-CM | POA: Diagnosis not present

## 2023-11-26 LAB — BASIC METABOLIC PANEL WITH GFR
Anion gap: 11 (ref 5–15)
Anion gap: 9 (ref 5–15)
Anion gap: 11 (ref 5–15)
BUN: 5 mg/dL — ABNORMAL LOW (ref 6–20)
BUN: 5 mg/dL — ABNORMAL LOW (ref 6–20)
BUN: 6 mg/dL (ref 6–20)
CO2: 21 mmol/L — ABNORMAL LOW (ref 22–32)
CO2: 22 mmol/L (ref 22–32)
CO2: 23 mmol/L (ref 22–32)
Calcium: 7.3 mg/dL — ABNORMAL LOW (ref 8.9–10.3)
Calcium: 7.4 mg/dL — ABNORMAL LOW (ref 8.9–10.3)
Calcium: 7.7 mg/dL — ABNORMAL LOW (ref 8.9–10.3)
Chloride: 92 mmol/L — ABNORMAL LOW (ref 98–111)
Chloride: 94 mmol/L — ABNORMAL LOW (ref 98–111)
Chloride: 96 mmol/L — ABNORMAL LOW (ref 98–111)
Creatinine, Ser: 0.52 mg/dL — ABNORMAL LOW (ref 0.61–1.24)
Creatinine, Ser: 0.5 mg/dL — ABNORMAL LOW (ref 0.61–1.24)
Creatinine, Ser: 0.49 mg/dL — ABNORMAL LOW (ref 0.61–1.24)
GFR, Estimated: >60 mL/min (ref >60)
GFR, Estimated: >60 mL/min (ref >60)
GFR, Estimated: >60 mL/min (ref >60)
Glucose, Bld: 116 mg/dL — ABNORMAL HIGH (ref 70–99)
Glucose, Bld: 128 mg/dL — ABNORMAL HIGH (ref 70–99)
Glucose, Bld: 95 mg/dL (ref 70–99)
Potassium: 3.3 mmol/L — ABNORMAL LOW (ref 3.5–5.1)
Potassium: 3 mmol/L — ABNORMAL LOW (ref 3.5–5.1)
Potassium: 3.3 mmol/L — ABNORMAL LOW (ref 3.5–5.1)
Sodium: 124 mmol/L — ABNORMAL LOW (ref 135–145)
Sodium: 125 mmol/L — ABNORMAL LOW (ref 135–145)
Sodium: 130 mmol/L — ABNORMAL LOW (ref 135–145)

## 2023-11-26 LAB — HEPATITIS PANEL, ACUTE
HCV Ab: NONREACTIVE
Hep A IgM: NONREACTIVE
Hep B C IgM: NONREACTIVE
Hepatitis B Surface Ag: NONREACTIVE

## 2023-11-26 LAB — CBC
HCT: 30.6 % — ABNORMAL LOW (ref 39.0–52.0)
Hemoglobin: 10.7 g/dL — ABNORMAL LOW (ref 13.0–17.0)
MCH: 32.7 pg (ref 26.0–34.0)
MCHC: 35 g/dL (ref 30.0–36.0)
MCV: 93.6 fL (ref 80.0–100.0)
Platelets: 287 10*3/uL (ref 150–400)
RBC: 3.27 MIL/uL — ABNORMAL LOW (ref 4.22–5.81)
RDW: 15.8 % — ABNORMAL HIGH (ref 11.5–15.5)
WBC: 14.9 10*3/uL — ABNORMAL HIGH (ref 4.0–10.5)
nRBC: 0 % (ref 0.0–0.2)

## 2023-11-26 LAB — OSMOLALITY: Osmolality: 270 mosm/kg — ABNORMAL LOW (ref 275–295)

## 2023-11-26 LAB — MRSA NEXT GEN BY PCR, NASAL: MRSA by PCR Next Gen: NOT DETECTED

## 2023-11-26 LAB — SODIUM, URINE, RANDOM: Sodium, Ur: <10 mmol/L

## 2023-11-26 LAB — OSMOLALITY, URINE: Osmolality, Ur: 126 mosm/kg — ABNORMAL LOW (ref 300–900)

## 2023-11-26 LAB — PHOSPHORUS: Phosphorus: 3.3 mg/dL (ref 2.5–4.6)

## 2023-11-26 LAB — MAGNESIUM: Magnesium: 1.8 mg/dL (ref 1.7–2.4)

## 2023-11-26 MED ORDER — NICOTINE 21 MG/24HR TD PT24
21.0000 mg | MEDICATED_PATCH | Freq: Every day | TRANSDERMAL | Status: DC
  Administered 2023-11-26 – 2023-11-27 (×2): 21 mg via TRANSDERMAL
  Filled 2023-11-26 (×2): qty 1

## 2023-11-26 MED ORDER — SODIUM CHLORIDE 0.9 % IV SOLN: INTRAVENOUS | Status: DC

## 2023-11-26 MED ORDER — HYDROCODONE-ACETAMINOPHEN 5-325 MG PO TABS
1.0000 | ORAL_TABLET | Freq: Four times a day (QID) | ORAL | Status: DC | PRN
  Administered 2023-11-26 – 2023-11-27 (×2): 1 via ORAL
  Filled 2023-11-26 (×2): qty 1

## 2023-11-26 MED ORDER — ACETAMINOPHEN 325 MG PO TABS
650.0000 mg | ORAL_TABLET | Freq: Three times a day (TID) | ORAL | Status: DC | PRN
  Administered 2023-11-26: 650 mg via ORAL
  Filled 2023-11-26: qty 2

## 2023-11-26 NOTE — Progress Notes (Signed)
 Critical Lab Notification   11/26/23 0007  Provider Notification  Provider Name/Title Dr. Elyse Hand, MD  Date Provider Notified 11/26/23  Time Provider Notified 0021  Method of Notification Page  Notification Reason Critical Result  Test performed and critical result Sodium 118  Date Critical Result Received 11/26/23  Time Critical Result Received 0005  Provider response See new orders  Date of Provider Response 11/26/23

## 2023-11-26 NOTE — Assessment & Plan Note (Signed)
-   Continue to follow ammonia level intermittently - Follow clinical response - Continue lactulose .

## 2023-11-26 NOTE — Assessment & Plan Note (Signed)
-   In the setting of beer potomania and dehydration -Sodium has improved to 131 (approximately 16 mmol/L in 48 hours)- -will continue to maintain adequate oral hydration; sodium levels appears to be at baseline currently. - Continue to follow electrolyte trend. -Overall mentation continue improving - Case discussed with nephrology service and there is no need for hypertonic saline infusion. -Alcohol  cessation counseling provided

## 2023-11-26 NOTE — Assessment & Plan Note (Signed)
-   In the setting of dehydration and alcohol  abuse - Cessation counseling provided - Will continue fluids rotation - Continue to follow LFTs intermittently - Minimize the use of hepatotoxic agents.

## 2023-11-26 NOTE — Assessment & Plan Note (Addendum)
-   Elevated ammonia level - Will continue treatment with lactulose . - Patient ended requiring Precedex  due to component of withdrawal and severe agitation/restlessness - Wean off as tolerated and follow clinical response. -No need for steroid therapy at the moment.

## 2023-11-26 NOTE — Assessment & Plan Note (Signed)
-   Will replete electrolytes and follow trend.

## 2023-11-26 NOTE — Assessment & Plan Note (Signed)
-  Body mass index is 36.2 kg/m. - Low-calorie diet and portion control discussed with patient.

## 2023-11-26 NOTE — Therapy (Deleted)
 OUTPATIENT PHYSICAL THERAPY LOWER EXTREMITY EVALUATION   Patient Name: Connor Copeland MRN: [id number] DOB:08-24-1975, 48 y.o., male Today's Date: 11/26/2023  END OF SESSION:   Past Medical History:  Diagnosis Date   CIDP (chronic inflammatory demyelinating polyneuropathy) (HCC)    ETOH abuse    Gout    Past Surgical History:  Procedure Laterality Date   FOOT SURGERY     Patient Active Problem List   Diagnosis Date Noted   Subdural hematoma (HCC) 11/25/2023   Hepatic encephalopathy (HCC) 11/25/2023   Hyperammonemia (HCC) 11/25/2023   Hypomagnesemia 11/25/2023   Transaminitis 11/25/2023   Leukocytosis 11/25/2023   Thrombocytosis 11/25/2023   Obesity, Class II, BMI 35-39.9 11/25/2023   Open wound of left upper extremity 10/11/2023   Aspiration pneumonia (HCC) 10/11/2023   Elevated LFTs 10/11/2023   Obesity (BMI 30-39.9) 10/11/2023   Subdural hygroma 10/11/2023   Hypokalemia 10/06/2023   Lactic acidosis 10/06/2023   Acute kidney injury (HCC) 10/06/2023   High anion gap metabolic acidosis 10/06/2023   SVT (supraventricular tachycardia) (HCC) 08/13/2023   Severe alcohol  withdrawal with perceptual disturbances (HCC) 08/09/2023   Hyperglycemia 08/08/2023   Gait disturbance 08/04/2023   Dysesthesia 08/04/2023   Seizure disorder (HCC) 03/19/2023   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   Neuropathic pain 03/19/2023   Alcohol  withdrawal seizure with delirium (HCC) 01/28/2023   Alcohol  abuse 01/28/2023   Tobacco use disorder 01/28/2023   Hyponatremia 01/28/2023   Elevated MCV 01/28/2023   Macrocytic anemia 01/28/2023   Elevated transaminase level 01/28/2023   Gout 01/28/2023   HLD (hyperlipidemia) 12/11/2022   Hypertriglyceridemia 12/11/2022   Peripheral neuropathy 12/11/2022   Hypoalbuminemia 12/11/2022    PCP: Omie Bickers, MD  REFERRING PROVIDER: Sterling Eisenmenger, PA-C  REFERRING DIAG: 2060343250 (ICD-10-CM) - Chronic  inflammatory demyelinating polyneuritis  THERAPY DIAG:  No diagnosis found.  Rationale for Evaluation and Treatment: Rehabilitation  ONSET DATE: ***  SUBJECTIVE:   SUBJECTIVE STATEMENT: ***  PERTINENT HISTORY: *** PAIN:  Are you having pain? {OPRCPAIN:27236}  PRECAUTIONS: {Therapy precautions:24002}  RED FLAGS: {PT Red Flags:29287}   WEIGHT BEARING RESTRICTIONS: {Yes ***/No:24003}  FALLS:  Has patient fallen in last 6 months? {fallsyesno:27318}  LIVING ENVIRONMENT: Lives with: {OPRC lives with:25569::"lives with their family"} Lives in: {Lives in:25570} Stairs: {opstairs:27293} Has following equipment at home: {Assistive devices:23999}  OCCUPATION: ***  PLOF: {PLOF:24004}  PATIENT GOALS: ***  NEXT MD VISIT: ***  OBJECTIVE:  Note: Objective measures were completed at Evaluation unless otherwise noted.  DIAGNOSTIC FINDINGS: N/A  PATIENT SURVEYS:  {rehab surveys:24030}  COGNITION: Overall cognitive status: {cognition:24006}     SENSATION: {sensation:27233}  EDEMA:  {edema:24020}  MUSCLE LENGTH: Hamstrings: Right *** deg; Left *** deg Andy Bannister test: Right *** deg; Left *** deg  POSTURE: {posture:25561}  PALPATION: ***  LOWER EXTREMITY ROM:  {AROM/PROM:27142} ROM Right eval Left eval  Hip flexion    Hip extension    Hip abduction    Hip adduction    Hip internal rotation    Hip external rotation    Knee flexion    Knee extension    Ankle dorsiflexion    Ankle plantarflexion    Ankle inversion    Ankle eversion     (Blank rows = not tested)  LOWER EXTREMITY MMT:  MMT Right eval Left eval  Hip flexion    Hip extension    Hip abduction    Hip adduction    Hip internal rotation    Hip external rotation  Knee flexion    Knee extension    Ankle dorsiflexion    Ankle plantarflexion    Ankle inversion    Ankle eversion     (Blank rows = not tested)  LOWER EXTREMITY SPECIAL TESTS:  {LEspecialtests:26242}  FUNCTIONAL  TESTS:  {Functional tests:24029}  GAIT: Distance walked: *** Assistive device utilized: {Assistive devices:23999} Level of assistance: {Levels of assistance:24026} Comments: ***                                                                                                                                TREATMENT DATE:  11/26/23: PT Eval and HEP    PATIENT EDUCATION:  Education details: PT evaluation, objective findings, POC, Importance of HEP, Precautions, Clinic policies Person educated: Patient Education method: Explanation and Demonstration Education comprehension: verbalized understanding and returned demonstration  HOME EXERCISE PROGRAM: ***  ASSESSMENT:  CLINICAL IMPRESSION: Patient is a 48 y.o. male who was seen today for physical therapy evaluation and treatment for G61.81 (ICD-10-CM) - Chronic inflammatory demyelinating polyneuritis.   OBJECTIVE IMPAIRMENTS: {opptimpairments:25111}.   ACTIVITY LIMITATIONS: {activitylimitations:27494}  PARTICIPATION LIMITATIONS: {participationrestrictions:25113}  PERSONAL FACTORS: {Personal factors:25162} are also affecting patient's functional outcome.   REHAB POTENTIAL: {rehabpotential:25112}  CLINICAL DECISION MAKING: {clinical decision making:25114}  EVALUATION COMPLEXITY: {Evaluation complexity:25115}   GOALS: Goals reviewed with patient? No  SHORT TERM GOALS: Target date: 12/10/23 Patient will be independent with performance of HEP to demonstrate adequate self management of symptoms.  Baseline:  Goal status: INITIAL  2.   Patient will report at least a 25% improvement with function or pain overall since beginning PT. Baseline:  Goal status: INITIAL   LONG TERM GOALS: Target date: 01/07/24  *** Baseline:  Goal status: INITIAL  2.  *** Baseline:  Goal status: INITIAL  3.  *** Baseline:  Goal status: INITIAL  4.  *** Baseline:  Goal status: INITIAL  5.  *** Baseline:  Goal status: INITIAL  6.   *** Baseline:  Goal status: INITIAL   PLAN:  PT FREQUENCY: {rehab frequency:25116}  PT DURATION: {rehab duration:25117}  PLANNED INTERVENTIONS: {rehab planned interventions:25118::"97110-Therapeutic exercises","97530- Therapeutic [phone]- Neuromuscular re-education","97535- Self FAOZ","30865- Manual therapy"}  PLAN FOR NEXT SESSION: ***   Jafar Poffenberger E Powell-Butler, PT 11/26/2023, 9:44 AM

## 2023-11-26 NOTE — TOC Initial Note (Signed)
 Transition of Care [facility]) - Initial/Assessment Note    Patient Details  Name: Connor Copeland MRN: [id number] Date of Birth: Nov 28, 1975  [facility]) CM/SW Contact:    Orelia Binet, RN Phone Number: 11/26/2023, 1:55 PM  Clinical Narrative:     Patient admitted with hyponatremia. Patient has a high risk for readmission. Recently hospitized and assessed, lives alone, family support close by, set up with [facility] home health. Patient is confused, in restrains and on precedex . TOC following.               Expected Discharge Plan: Home/Self Care Barriers to Discharge: Continued Medical Work up   Patient Goals and CMS Choice Patient states their goals for this hospitalization and ongoing recovery are:: return home        Activities of Daily Living   ADL Screening (condition at time of admission) Independently performs ADLs?: No Does the patient have a NEW difficulty with bathing/dressing/toileting/self-feeding that is expected to last >3 days?: Yes (Initiates electronic notice to provider for possible OT consult) Does the patient have a NEW difficulty with getting in/out of bed, walking, or climbing stairs that is expected to last >3 days?: Yes (Initiates electronic notice to provider for possible PT consult) Does the patient have a NEW difficulty with communication that is expected to last >3 days?: No Is the patient deaf or have difficulty hearing?: No Does the patient have difficulty seeing, even when wearing glasses/contacts?: No Does the patient have difficulty concentrating, remembering, or making decisions?: No  Permission Sought/Granted    Emotional Assessment     Admission diagnosis:  Hepatic encephalopathy (HCC) [K76.82] Hyponatremia [E87.1] Subdural hematoma (HCC) [S06.5XAA] Patient Active Problem List   Diagnosis Date Noted   Subdural hematoma (HCC) 11/25/2023   Hepatic encephalopathy (HCC) 11/25/2023   Hyperammonemia (HCC) 11/25/2023    Hypomagnesemia 11/25/2023   Transaminitis 11/25/2023   Leukocytosis 11/25/2023   Thrombocytosis 11/25/2023   Obesity, Class II, BMI 35-39.9 11/25/2023   Open wound of left upper extremity 10/11/2023   Aspiration pneumonia (HCC) 10/11/2023   Elevated LFTs 10/11/2023   Obesity (BMI 30-39.9) 10/11/2023   Subdural hygroma 10/11/2023   Hypokalemia 10/06/2023   Lactic acidosis 10/06/2023   Acute kidney injury (HCC) 10/06/2023   High anion gap metabolic acidosis 10/06/2023   SVT (supraventricular tachycardia) (HCC) 08/13/2023   Severe alcohol  withdrawal with perceptual disturbances (HCC) 08/09/2023   Hyperglycemia 08/08/2023   Gait disturbance 08/04/2023   Dysesthesia 08/04/2023   Seizure disorder (HCC) 03/19/2023   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   Neuropathic pain 03/19/2023   Alcohol  withdrawal seizure with delirium (HCC) 01/28/2023   Alcohol  abuse 01/28/2023   Tobacco use disorder 01/28/2023   Hyponatremia 01/28/2023   Elevated MCV 01/28/2023   Macrocytic anemia 01/28/2023   Elevated transaminase level 01/28/2023   Gout 01/28/2023   HLD (hyperlipidemia) 12/11/2022   Hypertriglyceridemia 12/11/2022   Peripheral neuropathy 12/11/2022   Hypoalbuminemia 12/11/2022   PCP:  Omie Bickers, MD Pharmacy:   [facility] [place] [place] - [address]  [place] [address] Phone: [phone] Fax: [phone]  [facility] [address] [place] [address] Phone: [phone] Fax: [phone]  [facility] [address] [place] [address] Phone: [phone] Fax: [phone]     Social Drivers of Health (SDOH) Social History: SDOH Screenings   Food Insecurity: Patient Unable To Answer (11/26/2023)  Housing: Patient Unable To  Answer (11/26/2023)  Transportation Needs: Patient Unable To Answer (11/26/2023)  Utilities:  Patient Unable To Answer (11/26/2023)  Tobacco Use: High Risk (11/25/2023)   SDOH Interventions:    Readmission Risk Interventions    11/26/2023    1:55 PM 08/19/2023   10:48 AM  Readmission Risk Prevention Plan  Transportation Screening Complete Complete  PCP or Specialist Appt within 3-5 Days  Not Complete  HRI or Home Care Consult  Complete  Social Work Consult for Recovery Care Planning/Counseling  Complete  Palliative Care Screening  Not Applicable  Medication Review Oceanographer) Complete Complete  PCP or Specialist appointment within 3-5 days of discharge Not Complete   HRI or Home Care Consult Complete   SW Recovery Care/Counseling Consult Complete   Palliative Care Screening Not Applicable   Skilled Nursing Facility Not Applicable

## 2023-11-26 NOTE — Progress Notes (Signed)
 Progress Note   Patient: Connor Copeland ZHY:[id number] DOB: 03-14-1976 DOA: 11/25/2023     1 DOS: the patient was seen and examined on 11/26/2023   Brief hospital admission narrative: As per H&P written by Dr. Elyse Hand on 11/25/2023 ANEESH FALLER is a 48 y.o. male with medical history significant of chronic inflammatory demyelinating polyneuropathy, alcohol  disorder seizure, gout who presents to the emergency department from home via EMS due to a fall sustained at home.  Patient was confused and was unable to provide history, history was obtained from ED physician and ED medical record..  Apparently, patient sustained a mechanical fall and denies hitting his head with complaint of hands and feet pain from his chronic inflammatory condition.   ED Course:  In the emergency department, pulse was 110 bpm, BP 123/100, other vital signs are within normal range.  Workup in the ED showed WBC of 15.3, normocytic anemia and thrombocytosis.  BMP showed sodium of 115, potassium 3.5, chloride 92, bicarb 16, blood glucose 113, BUN 6, creatinine 0.95.  Albumin  2.0, AST 78, ALT 182, ALP 250, total bilirubin 5.8, anion gap 17, urinalysis was normal.  Ammonia 85, magnesium  1.3 CT head without contrast showed right frontal subdural collection concerning for an acute subdural hemorrhage on prior hygroma with midline shift. CT cervical spine without contrast showed no acute/traumatic cervical spine pathology Neurosurgery was consulted and recommended repeat CT scan in 8 hours Nephrologist (Dr. Austine Blunt) was consulted and recommended rechecking patient's sodium after the 1 L of LR provided. Hospitalist was asked to admit patient for further evaluation and management.  Assessment and plan  Hyponatremia - In the setting of beer potomania and dehydration -Sodium has improved to 124 (approximately 9 mmol/L in 24 hours)- -continue fluid resuscitation - Continue to follow ultralights trend -Overall mentation  is improving - Case discussed with nephrology service and there is no need for hypertonic saline infusion. -Alcohol  cessation counseling provided  Subdural hematoma (HCC) - Patient with subdural hematoma in the presence of a previous known hygroma - Case discussed with neurosurgery; no intervention or evacuation required. - Continue monitoring - Repeat CT scan has shown no midline shift and is stable hematoma. -Avoid hypertension and heparin  products.  Hepatic encephalopathy (HCC) - Elevated ammonia level - Will continue treatment with lactulose . - Patient ended requiring Precedex  due to component of withdrawal and severe agitation/restlessness - Wean off as tolerated and follow clinical response. -No need for steroid therapy at the moment.  Hyperammonemia (HCC) - Continue to follow ammonia level intermittently - Follow clinical response - Continue lactulose .  Hypomagnesemia - Will replete electrolytes and follow trend.  Transaminitis - In the setting of dehydration and alcohol  abuse - Cessation counseling provided - Will continue fluids rotation - Continue to follow LFTs intermittently - Minimize the use of hepatotoxic agents.  Obesity, Class II, BMI 35-39.9 -Body mass index is 36.2 kg/m. - Low-calorie diet and portion control discussed with patient.  Subjective:  Oriented x 1 and intermittently following commands.  Starting to wean off Precedex  drip with intention to use sitter at bedside.  No nausea, no vomiting, no chest pain.  Physical Exam: Vitals:   11/26/23 1315 11/26/23 1330 11/26/23 1345 11/26/23 1400  BP: (!) 114/93 112/86 121/66 100/65  Pulse: 82 81 81 88  Resp: 12 10 12 13   Temp:      TempSrc:      SpO2: 100% 100% 100% 100%  Weight:      Height:  General exam: Intermittent sleepy and confused; oriented x 1 following commands.  No nausea, no vomiting, no fever. Respiratory system: Clear to auscultation. Respiratory effort normal.  Good saturation  on room air. Cardiovascular system:RRR. No rubs or gallops. Gastrointestinal system: Abdomen is obese, nondistended, soft and nontender. No organomegaly or masses felt. Normal bowel sounds heard. Central nervous system: Alert and oriented. No focal neurological deficits. Extremities: No cyanosis or clubbing; no edema. Skin: No petechiae. Psychiatry: Judgement and insight appear impaired in the setting of acute metabolic/hepatic encephalopathy.  Data Reviewed: Basic metabolic panel: Sodium 124, potassium 3.3, chloride 92, bicarb 21, BUN 5, creatinine 0.52, GFR >60.  Anion gap 11. CBC: WBCs 14.9, hemoglobin 10.7 and platelet count 287K. Magnesium : 1.8 Acute hepatitis panel: Nonreactive.  Family Communication: No family at bedside.  Disposition: Status is: Inpatient Remains inpatient appropriate because: Continue IV therapy.  Hopefully able to go home once medically stable.  Will follow physical therapy evaluation and recommendations.  Time spent: 55 minutes  Author: Justina Oman, MD 11/26/2023 3:16 PM  For on call review [email or web address].

## 2023-11-26 NOTE — Assessment & Plan Note (Signed)
-   Patient with subdural hematoma in the presence of a previous known hygroma - Case discussed with neurosurgery; no intervention or evacuation required. - Continue monitoring - Repeat CT scan has shown no midline shift and is stable hematoma. -Avoid hypertension and heparin  products.

## 2023-11-27 DIAGNOSIS — E66812 Obesity, class 2: Secondary | ICD-10-CM | POA: Diagnosis not present

## 2023-11-27 DIAGNOSIS — E871 Hypo-osmolality and hyponatremia: Secondary | ICD-10-CM | POA: Diagnosis not present

## 2023-11-27 DIAGNOSIS — K7682 Hepatic encephalopathy: Secondary | ICD-10-CM | POA: Diagnosis not present

## 2023-11-27 DIAGNOSIS — S065XAA Traumatic subdural hemorrhage with loss of consciousness status unknown, initial encounter: Secondary | ICD-10-CM | POA: Diagnosis not present

## 2023-11-27 LAB — COMPREHENSIVE METABOLIC PANEL WITH GFR
ALT: 177 U/L — ABNORMAL HIGH (ref 0–44)
AST: 534 U/L — ABNORMAL HIGH (ref 15–41)
Albumin: <1.5 g/dL — ABNORMAL LOW (ref 3.5–5.0)
Alkaline Phosphatase: 175 U/L — ABNORMAL HIGH (ref 38–126)
Anion gap: 9 (ref 5–15)
BUN: 7 mg/dL (ref 6–20)
CO2: 24 mmol/L (ref 22–32)
Calcium: 7.4 mg/dL — ABNORMAL LOW (ref 8.9–10.3)
Chloride: 98 mmol/L (ref 98–111)
Creatinine, Ser: 0.49 mg/dL — ABNORMAL LOW (ref 0.61–1.24)
GFR, Estimated: >60 mL/min (ref >60)
Glucose, Bld: 91 mg/dL (ref 70–99)
Potassium: 3.4 mmol/L — ABNORMAL LOW (ref 3.5–5.1)
Sodium: 131 mmol/L — ABNORMAL LOW (ref 135–145)
Total Bilirubin: 3.4 mg/dL — ABNORMAL HIGH (ref 0.0–1.2)
Total Protein: 4.3 g/dL — ABNORMAL LOW (ref 6.5–8.1)

## 2023-11-27 LAB — CBC
HCT: 28.2 % — ABNORMAL LOW (ref 39.0–52.0)
Hemoglobin: 9.4 g/dL — ABNORMAL LOW (ref 13.0–17.0)
MCH: 32.6 pg (ref 26.0–34.0)
MCHC: 33.3 g/dL (ref 30.0–36.0)
MCV: 97.9 fL (ref 80.0–100.0)
Platelets: 225 10*3/uL (ref 150–400)
RBC: 2.88 MIL/uL — ABNORMAL LOW (ref 4.22–5.81)
RDW: 16.7 % — ABNORMAL HIGH (ref 11.5–15.5)
WBC: 8.7 10*3/uL (ref 4.0–10.5)
nRBC: 0.3 % — ABNORMAL HIGH (ref 0.0–0.2)

## 2023-11-27 MED ORDER — HYDROMORPHONE HCL 1 MG/ML IJ SOLN
0.5000 mg | Freq: Once | INTRAMUSCULAR | Status: AC | PRN
  Administered 2023-11-27: 0.5 mg via INTRAVENOUS
  Filled 2023-11-27: qty 0.5

## 2023-11-27 MED ORDER — POTASSIUM CHLORIDE CRYS ER 20 MEQ PO TBCR
40.0000 meq | EXTENDED_RELEASE_TABLET | Freq: Once | ORAL | Status: AC
  Administered 2023-11-27: 40 meq via ORAL
  Filled 2023-11-27: qty 2

## 2023-11-27 MED ORDER — MAGNESIUM SULFATE IN D5W 1-5 GM/100ML-% IV SOLN
1.0000 g | Freq: Once | INTRAVENOUS | Status: AC
  Administered 2023-11-27: 1 g via INTRAVENOUS
  Filled 2023-11-27: qty 100

## 2023-11-27 MED ORDER — OXYCODONE HCL 5 MG PO TABS
5.0000 mg | ORAL_TABLET | Freq: Four times a day (QID) | ORAL | Status: DC | PRN
  Administered 2023-11-27 – 2023-11-28 (×4): 5 mg via ORAL
  Filled 2023-11-27 (×4): qty 1

## 2023-11-27 MED ORDER — ALBUMIN HUMAN 25 % IV SOLN
50.0000 g | Freq: Once | INTRAVENOUS | Status: AC
Start: 2023-11-27 — End: 2023-11-27
  Administered 2023-11-27: 50 g via INTRAVENOUS
  Filled 2023-11-27: qty 200

## 2023-11-27 MED ORDER — LACTULOSE 10 GM/15ML PO SOLN
20.0000 g | Freq: Every day | ORAL | Status: DC
  Administered 2023-11-27 – 2023-11-28 (×2): 20 g via ORAL
  Filled 2023-11-27 (×2): qty 30

## 2023-11-27 MED ORDER — BACITRACIN-NEOMYCIN-POLYMYXIN OINTMENT TUBE
TOPICAL_OINTMENT | CUTANEOUS | Status: DC | PRN
  Administered 2023-11-27: 1 via TOPICAL

## 2023-11-27 MED ORDER — POLYVINYL ALCOHOL 1.4 % OP SOLN
1.0000 [drp] | OPHTHALMIC | Status: DC | PRN
  Administered 2023-11-27: 1 [drp] via OPHTHALMIC
  Filled 2023-11-27: qty 15

## 2023-11-27 NOTE — Progress Notes (Signed)
 Progress Note   Patient: Connor Copeland [id number] DOB: [birth year] DOA: 11/25/2023     2 DOS: the patient was seen and examined on 11/27/2023   Brief hospital admission narrative: As per H&P written by Dr. Elyse Hand on 11/25/2023 Connor Copeland is a 48 y.o. male with medical history significant of chronic inflammatory demyelinating polyneuropathy, alcohol  disorder seizure, gout who presents to the emergency department from home via EMS due to a fall sustained at home.  Patient was confused and was unable to provide history, history was obtained from ED physician and ED medical record..  Apparently, patient sustained a mechanical fall and denies hitting his head with complaint of hands and feet pain from his chronic inflammatory condition.   ED Course:  In the emergency department, pulse was 110 bpm, BP 123/100, other vital signs are within normal range.  Workup in the ED showed WBC of 15.3, normocytic anemia and thrombocytosis.  BMP showed sodium of 115, potassium 3.5, chloride 92, bicarb 16, blood glucose 113, BUN 6, creatinine 0.95.  Albumin  2.0, AST 78, ALT 182, ALP 250, total bilirubin 5.8, anion gap 17, urinalysis was normal.  Ammonia 85, magnesium  1.3 CT head without contrast showed right frontal subdural collection concerning for an acute subdural hemorrhage on prior hygroma with midline shift. CT cervical spine without contrast showed no acute/traumatic cervical spine pathology Neurosurgery was consulted and recommended repeat CT scan in 8 hours Nephrologist (Dr. Austine Blunt) was consulted and recommended rechecking patient's sodium after the 1 L of LR provided. Hospitalist was asked to admit patient for further evaluation and management.  Assessment and plan  Hyponatremia - In the setting of beer potomania and dehydration -Sodium has improved to 131 (approximately 16 mmol/L in 48 hours)- -will continue to maintain adequate oral hydration; sodium levels appears to be at  baseline currently. - Continue to follow electrolyte trend. -Overall mentation continue improving - Case discussed with nephrology service and there is no need for hypertonic saline infusion. -Alcohol  cessation counseling provided  Subdural hematoma (HCC) - Nontraumatic subdural hematoma in the presence of a previous known hygroma - Case discussed with neurosurgery; no intervention or evacuation required. - Continue monitoring - Repeat CT scan has shown no midline shift and is stable hematoma. -Avoid hypertension and heparin  products.  Hepatic encephalopathy (HCC) - Elevated ammonia level - Will continue treatment with lactulose . - Patient ended requiring Precedex  due to component of withdrawal and severe agitation/restlessness - Wean off as tolerated and follow clinical response. -No need for steroid therapy at the moment.  Hyperammonemia (HCC) - Continue to follow ammonia level intermittently - Follow clinical response - Continue lactulose .  Hypomagnesemia - Will replete electrolytes and follow trend.  Transaminitis - In the setting of dehydration and alcohol  abuse - Cessation counseling provided - Will continue fluids rotation - Continue to follow LFTs intermittently - Minimize the use of hepatotoxic agents.  Obesity, Class II, BMI 35-39.9 -Body mass index is 36.2 kg/m. - Low-calorie diet and portion control discussed with patient.  Leukocytosis - In the setting of x-rays imaging patient most likely - No signs of acute infection currently appreciated - After fluid resuscitation patient's WBCs back to within normal limits. -Continue to follow trend and continue supportive care.  Metabolic acidosis - Improved with fluid resuscitation - Continue to follow electrolytes trend - Patient advised to maintain adequate oral hydration and nutrition.  Physical deconditioning - Physical therapy evaluation has been requested.  Subjective:  Oriented x 2; reports no chest  pain, no nausea,  no vomiting and no abdominal pain.  Good saturation on room air.  Following commands appropriately and not demonstrating agitation or restlessness at the moment.  Patient reports generalized pain.  Physical Exam: Vitals:   11/27/23 1200 11/27/23 1300 11/27/23 1400 11/27/23 1500  BP: 116/63 119/84 109/65 129/72  Pulse: 97 (!) 101 (!) 105 (!) 118  Resp: 18 20 12  (!) 22  Temp:      TempSrc:      SpO2: 100% 100% 100% 99%  Weight:      Height:       General exam: Alert, awake and following commands appropriately; patient demonstrated to be oriented x 2 and not demonstrating agitation or restlessness. Respiratory system: Good saturation on room air. Cardiovascular system:RRR. No rubs or gallops; no JVD on exam. Gastrointestinal system: Abdomen is nondistended, soft and nontender.  Positive bowel sounds. Central nervous system: Generally weak; no focal deficit. Extremities: No cyanosis or clubbing; multiple bruises appreciated on his limbs bilaterally (upper and lower extremity).  1+ edema Skin: No rashes or open wound. Psychiatry: Flat affect on exam.  Data Reviewed: CMET: sodium 131, calcium  3.4, chloride 98, bicarb 24, BUN 7, creatinine 0.49, AST 534, ALT 177, alkaline phosphatase 175 and GFR >60 CBC: Hemoglobin 9.4, WBCs 8.7 and platelet count 225K Magnesium : 1.8 Acute hepatitis panel: Nonreactive.  Family Communication: No family at bedside.  Disposition: Status is: Inpatient Remains inpatient appropriate because: Continue IV therapy.  Hopefully able to go home once medically stable.  Will follow physical therapy evaluation and recommendations.  Time spent: 55 minutes  Author: Justina Oman, MD 11/27/2023 6:38 PM  For on call review [email or web address].

## 2023-11-27 NOTE — Assessment & Plan Note (Signed)
-   In the setting of x-rays imaging patient most likely - No signs of acute infection currently appreciated - After fluid resuscitation patient's WBCs back to within normal limits. -Continue to follow trend and continue supportive care.

## 2023-11-28 DIAGNOSIS — K7682 Hepatic encephalopathy: Secondary | ICD-10-CM | POA: Diagnosis not present

## 2023-11-28 DIAGNOSIS — E871 Hypo-osmolality and hyponatremia: Secondary | ICD-10-CM | POA: Diagnosis not present

## 2023-11-28 DIAGNOSIS — E66812 Obesity, class 2: Secondary | ICD-10-CM | POA: Diagnosis not present

## 2023-11-28 DIAGNOSIS — R7401 Elevation of levels of liver transaminase levels: Secondary | ICD-10-CM | POA: Diagnosis not present

## 2023-11-28 LAB — BASIC METABOLIC PANEL WITH GFR
Anion gap: 9 (ref 5–15)
BUN: 6 mg/dL (ref 6–20)
CO2: 23 mmol/L (ref 22–32)
Calcium: 7.7 mg/dL — ABNORMAL LOW (ref 8.9–10.3)
Chloride: 99 mmol/L (ref 98–111)
Creatinine, Ser: 0.53 mg/dL — ABNORMAL LOW (ref 0.61–1.24)
GFR, Estimated: >60 mL/min (ref >60)
Glucose, Bld: 146 mg/dL — ABNORMAL HIGH (ref 70–99)
Potassium: 3.1 mmol/L — ABNORMAL LOW (ref 3.5–5.1)
Sodium: 131 mmol/L — ABNORMAL LOW (ref 135–145)

## 2023-11-28 LAB — COMPREHENSIVE METABOLIC PANEL WITH GFR
ALT: 141 U/L — ABNORMAL HIGH (ref 0–44)
AST: 315 U/L — ABNORMAL HIGH (ref 15–41)
Albumin: 1.9 g/dL — ABNORMAL LOW (ref 3.5–5.0)
Alkaline Phosphatase: 165 U/L — ABNORMAL HIGH (ref 38–126)
Anion gap: 5 (ref 5–15)
BUN: 7 mg/dL (ref 6–20)
CO2: 25 mmol/L (ref 22–32)
Calcium: 7.4 mg/dL — ABNORMAL LOW (ref 8.9–10.3)
Chloride: 102 mmol/L (ref 98–111)
Creatinine, Ser: 0.59 mg/dL — ABNORMAL LOW (ref 0.61–1.24)
GFR, Estimated: >60 mL/min (ref >60)
Glucose, Bld: 133 mg/dL — ABNORMAL HIGH (ref 70–99)
Potassium: 2.9 mmol/L — ABNORMAL LOW (ref 3.5–5.1)
Sodium: 132 mmol/L — ABNORMAL LOW (ref 135–145)
Total Bilirubin: 3.4 mg/dL — ABNORMAL HIGH (ref 0.0–1.2)
Total Protein: 4.4 g/dL — ABNORMAL LOW (ref 6.5–8.1)

## 2023-11-28 LAB — CBC
HCT: 24.5 % — ABNORMAL LOW (ref 39.0–52.0)
Hemoglobin: 8.3 g/dL — ABNORMAL LOW (ref 13.0–17.0)
MCH: 32.8 pg (ref 26.0–34.0)
MCHC: 33.9 g/dL (ref 30.0–36.0)
MCV: 96.8 fL (ref 80.0–100.0)
Platelets: 252 10*3/uL (ref 150–400)
RBC: 2.53 MIL/uL — ABNORMAL LOW (ref 4.22–5.81)
RDW: 17.2 % — ABNORMAL HIGH (ref 11.5–15.5)
WBC: 6.4 10*3/uL (ref 4.0–10.5)
nRBC: 0 % (ref 0.0–0.2)

## 2023-11-28 MED ORDER — BACITRACIN-NEOMYCIN-POLYMYXIN OINTMENT TUBE: TOPICAL_OINTMENT | CUTANEOUS | 0 refills | Status: DC

## 2023-11-28 MED ORDER — POTASSIUM CHLORIDE 10 MEQ/100ML IV SOLN
10.0000 meq | INTRAVENOUS | Status: AC
  Administered 2023-11-28 (×2): 10 meq via INTRAVENOUS
  Filled 2023-11-28: qty 100

## 2023-11-28 MED ORDER — LACTULOSE 20 GM/30ML PO SOLN: 20.0000 g | Freq: Every day | ORAL | 2 refills | Status: DC

## 2023-11-28 MED ORDER — PANTOPRAZOLE SODIUM 40 MG PO TBEC: 40.0000 mg | DELAYED_RELEASE_TABLET | Freq: Every day | ORAL | 1 refills | Status: AC

## 2023-11-28 MED ORDER — MAGNESIUM OXIDE -MG SUPPLEMENT 400 (240 MG) MG PO TABS: 400.0000 mg | ORAL_TABLET | Freq: Every day | ORAL | 1 refills | Status: DC

## 2023-11-28 MED ORDER — POTASSIUM CHLORIDE CRYS ER 20 MEQ PO TBCR: 20.0000 meq | EXTENDED_RELEASE_TABLET | Freq: Every day | ORAL | 0 refills | Status: DC

## 2023-11-28 MED ORDER — POTASSIUM CHLORIDE CRYS ER 20 MEQ PO TBCR
40.0000 meq | EXTENDED_RELEASE_TABLET | Freq: Once | ORAL | Status: AC
  Administered 2023-11-28: 40 meq via ORAL
  Filled 2023-11-28: qty 2

## 2023-11-28 NOTE — Discharge Summary (Addendum)
 Physician Discharge Summary   Patient: Connor Copeland MRN: [id number] DOB: [birth year]  Admit date:     11/25/2023  Discharge date: 11/28/23  Discharge Physician: Justina Oman   PCP: Omie Bickers, MD   Recommendations at discharge:  Repeat complete metabolic panel to follow ultralights renal function Continue assisting patient with alcohol  and tobacco cessation Outpatient follow-up with gastroenterology service recommended. Goals of care discussion and advance care planning recommended.  Discharge Diagnoses: Principal Problem:   Hyponatremia Active Problems:   Subdural hematoma (HCC)   Hepatic encephalopathy (HCC)   Hyperammonemia (HCC)   Hypomagnesemia   Transaminitis   Leukocytosis   Thrombocytosis   Obesity, Class II, BMI 35-39.9 Nontraumatic rhabdomyolysis Metabolic acidosis  Brief hospital admission narrative course: As per H&P written by Dr. Elyse Hand on 11/25/2023 Connor Copeland is a 48 y.o. male with medical history significant of chronic inflammatory demyelinating polyneuropathy, alcohol  disorder seizure, gout who presents to the emergency department from home via EMS due to a fall sustained at home.  Patient was confused and was unable to provide history, history was obtained from ED physician and ED medical record..  Apparently, patient sustained a mechanical fall and denies hitting his head with complaint of hands and feet pain from his chronic inflammatory condition.   ED Course:  In the emergency department, pulse was 110 bpm, BP 123/100, other vital signs are within normal range.  Workup in the ED showed WBC of 15.3, normocytic anemia and thrombocytosis.  BMP showed sodium of 115, potassium 3.5, chloride 92, bicarb 16, blood glucose 113, BUN 6, creatinine 0.95.  Albumin  2.0, AST 78, ALT 182, ALP 250, total bilirubin 5.8, anion gap 17, urinalysis was normal.  Ammonia 85, magnesium  1.3 CT head without contrast showed right frontal subdural collection  concerning for an acute subdural hemorrhage on prior hygroma with midline shift. CT cervical spine without contrast showed no acute/traumatic cervical spine pathology Neurosurgery was consulted and recommended repeat CT scan in 8 hours Nephrologist (Dr. Austine Blunt) was consulted and recommended rechecking patient's sodium after the 1 L of LR provided. Hospitalist was asked to admit patient for further evaluation and management.  Assessment and Plan: Hyponatremia - In the setting of beer potomania and dehydration -Sodium has improved to 131 (approximately 16 mmol/L in 48 hours)- -continue to maintain adequate oral hydration; sodium levels appears to be at baseline currently. - Continue to follow electrolyte trend with repeat complete metabolic panel at follow-up visit.. -Overall mentation back to baseline and patient demonstrating to be oriented x 3 and competent. -Alcohol  cessation counseling provided   Subdural hematoma (HCC) - Nontraumatic subdural hematoma in the presence of a previous known hygroma - Appreciate assistance and recommendation by neurosurgery service. - Continue monitoring - Repeat CT scan has shown no midline shift and is stable hematoma. -Avoid hypertension and the use of NSAIDs. - No further intervention or surgical evacuation currently required.   Hepatic encephalopathy (HCC) - Elevated ammonia level - Will continue treatment with lactulose . - Patient ended requiring Precedex  due to component of withdrawal and severe agitation/restlessness - Patient was successfully weaned off Precedex  and no significant withdrawal symptoms currently appreciated. - Continue lactulose  on daily basis. -No need for steroid therapy at the moment. -LFTs has continue adequately improving - Patient instructed to keep himself alcohol  free and to follow-up with gastroenterology as an outpatient.   Hyperammonemia (HCC) - Continue to follow ammonia level intermittently - Follow clinical  response - Continue lactulose . -Oriented x 3 at discharge.  Hypomagnesemia/hypokalemia - Electrolytes has been repleted and maintenance supplementation started.   Transaminitis - In the setting of dehydration and alcohol  abuse - Cessation counseling provided - patient advise to maintain adequate hydration. - Continue to follow LFTs intermittently. - Minimize the use of hepatotoxic agents. -outpatient follow up with gastroenterology service has been advised.   Obesity, Class II, BMI 35-39.9 -Body mass index is 36.2 kg/m. - Low-calorie diet and portion control discussed with patient.   Leukocytosis - In the setting of x-rays imaging patient most likely - No signs of acute infection currently appreciated - After fluid resuscitation patient's WBCs back to within normal limits. -Continue to follow trend intermittently.   Metabolic acidosis - Improved with fluid resuscitation - Continue to follow electrolytes trend - Patient advised to maintain adequate oral hydration and nutrition. -Completely resolved at time of discharge.   Physical deconditioning - Physical therapy evaluation has been requested. - Outpatient physical therapy will be arranged by social worker; patient not a candidate for SNF given insurance coverage.  Consultants: Neurosurgery consulted by EDP; nephrology service curbside. Procedures performed: See below for full report. Disposition: Home Diet recommendation: Portion control and low calorie diet.  DISCHARGE MEDICATION: Allergies as of 11/28/2023       Reactions   Neurontin [gabapentin] Swelling   Leg swelling        Medication List     STOP taking these medications    acetaminophen  325 MG tablet Commonly known as: TYLENOL    amLODipine  2.5 MG tablet Commonly known as: NORVASC    predniSONE  20 MG tablet Commonly known as: DELTASONE    Tradjenta  5 MG Tabs tablet Generic drug: linagliptin        TAKE these medications    diltiazem  120 MG  24 hr capsule Commonly known as: CARDIZEM  CD Take 1 capsule (120 mg total) by mouth daily.   DULoxetine  60 MG capsule Commonly known as: CYMBALTA  Take 2 capsules (120 mg total) by mouth daily.   febuxostat  40 MG tablet Commonly known as: ULORIC  Take 1 tablet (40 mg total) by mouth daily.   fluticasone 50 MCG/ACT nasal spray Commonly known as: FLONASE Place 1 spray into both nostrils daily.   folic acid  1 MG tablet Commonly known as: FOLVITE  Take 1 tablet (1 mg total) by mouth daily.   HYDROcodone -acetaminophen  7.5-325 MG tablet Commonly known as: NORCO Take 1 tablet by mouth 3 (three) times daily as needed. What changed: reasons to take this   Lactulose  20 GM/30ML Soln Take 30 mLs (20 g total) by mouth daily.   magnesium  oxide 400 (240 Mg) MG tablet Commonly known as: MAG-OX Take 1 tablet (400 mg total) by mouth 2 (two) times daily. What changed: Another medication with the same name was added. Make sure you understand how and when to take each.   magnesium  oxide 400 (240 Mg) MG tablet Commonly known as: MagOx 400 Take 1 tablet (400 mg total) by mouth daily. What changed: You were already taking a medication with the same name, and this prescription was added. Make sure you understand how and when to take each.   metFORMIN  500 MG tablet Commonly known as: GLUCOPHAGE  Take 500 mg by mouth 2 (two) times daily.   multivitamin with minerals Tabs tablet Take 1 tablet by mouth daily.   neomycin -bacitracin -polymyxin Oint Commonly known as: NEOSPORIN Apply topically twice a day to affected skin lesions.   nicotine  21 mg/24hr patch Commonly known as: NICODERM CQ  - dosed in mg/24 hours 21 mg patch daily for 1 week  then 14 mg patch daily for 3 weeks then 7 mg patch daily for 3 weeks and stop   nortriptyline  25 MG capsule Commonly known as: PAMELOR  Take 2 capsules (50 mg total) by mouth at bedtime.   Oxcarbazepine  300 MG tablet Commonly known as: TRILEPTAL  300 milligrams  daily and 600 mg nightly   pantoprazole  40 MG tablet Commonly known as: Protonix  Take 1 tablet (40 mg total) by mouth daily.   potassium chloride  SA 20 MEQ tablet Commonly known as: KLOR-CON  M Take 1 tablet (20 mEq total) by mouth daily.   thiamine  100 MG tablet Commonly known as: VITAMIN B1 Take 1 tablet (100 mg total) by mouth daily.               Discharge Care Instructions  (From admission, onward)           Start     Ordered   11/28/23 0000  Discharge wound care:       Comments: Keep areas clean and dry; continue constant repositioning   11/28/23 1329            Follow-up Information     [facility]. Go to.   Contact information: [facility] [phone] [address] [place], [place] [address]        [facility] Follow up.   Why: PT will call to schedule your first home visit.        Omie Bickers, MD. Schedule an appointment as soon as possible for a visit in 10 day(s).   Specialty: Internal Medicine Contact information: [address] [place] [address] [phone]                Discharge Exam: Filed Weights   11/25/23 1653 11/25/23 2133  Weight: 108 kg 108 kg   General exam: Alert, awake and following commands appropriately; patient demonstrated to be oriented x 3 and not demonstrating agitation or restlessness. Respiratory system: Good saturation on room air. Cardiovascular system:RRR. No rubs or gallops; no JVD on exam. Gastrointestinal system: Abdomen is nondistended, soft and nontender.  Positive bowel sounds. Central nervous system: Generally weak; no focal deficit. Extremities: No cyanosis or clubbing; multiple bruises appreciated on his limbs bilaterally (upper and lower extremity).  1+ edema Skin: No rashes or open wound. Psychiatry: Flat affect on exam.  Condition at discharge: Stable and improved.  The results of significant diagnostics from  this hospitalization (including imaging, microbiology, ancillary and laboratory) are listed below for reference.   Imaging Studies: CT Head Wo Contrast Result Date: 11/26/2023 CLINICAL DATA:  Follow-up intracranial hemorrhage EXAM: CT HEAD WITHOUT CONTRAST TECHNIQUE: Contiguous axial images were obtained from the base of the skull through the vertex without intravenous contrast. RADIATION DOSE REDUCTION: This exam was performed according to the departmental dose-optimization program which includes automated exposure control, adjustment of the mA and/or kV according to patient size and/or use of iterative reconstruction technique. COMPARISON:  11/25/2023 FINDINGS: Brain: Unchanged right convexity mixed density subdural hematoma, approximately 11 mm thick. No new site of hemorrhage. No midline shift. Vascular: No hyperdense vessel or unexpected vascular calcification. Skull: The visualized skull base, calvarium and extracranial soft tissues are normal. Sinuses/Orbits: No fluid levels or advanced mucosal thickening of the visualized paranasal sinuses. No mastoid or middle ear effusion. Normal orbits. Other: None. IMPRESSION: Unchanged right convexity mixed density subdural hematoma, approximately 11 mm thick. No new site of hemorrhage. Electronically Signed   By: Juanetta Nordmann  M.D.   On: 11/26/2023 02:22   CT Head Wo Contrast Result Date: 11/25/2023 CLINICAL DATA:  Head trauma. EXAM: CT HEAD WITHOUT CONTRAST CT CERVICAL SPINE WITHOUT CONTRAST TECHNIQUE: Multidetector CT imaging of the head and cervical spine was performed following the standard protocol without intravenous contrast. Multiplanar CT image reconstructions of the cervical spine were also generated. RADIATION DOSE REDUCTION: This exam was performed according to the departmental dose-optimization program which includes automated exposure control, adjustment of the mA and/or kV according to patient size and/or use of iterative reconstruction technique.  COMPARISON:  CT dated 10/02/2023. FINDINGS: Evaluation is very limited due to severe motion artifact. CT HEAD FINDINGS Brain: Right frontal subdural collection measures approximately 8 mm in thickness and demonstrates a higher attenuation than the prior CT concerning for an acute subdural hemorrhage on prior hygroma. Smaller left frontal subdural collection. There is mild mass effect on the right frontal lobe parenchyma. No significant midline shift. Vascular: No hyperdense vessel or unexpected calcification. Skull: Normal. Negative for fracture or focal lesion. Sinuses/Orbits: The visualized paranasal sinuses and the left mastoid air cells are clear. Mild right mastoid effusion. Other: None CT CERVICAL SPINE FINDINGS Alignment: No acute subluxation. Skull base and vertebrae: No acute fracture. Soft tissues and spinal canal: No prevertebral fluid or swelling. No visible canal hematoma. Disc levels:  No acute findings. Upper chest: Negative. Other: None IMPRESSION: 1. 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 Smaller left frontal subdural collection. No significant midline shift. 2. No acute/traumatic cervical spine pathology. These results were called by telephone at the time of interpretation on 11/25/2023 at 5:43 pm to provider ANDREW TEE , who verbally acknowledged these results. Electronically Signed   By: Angus Bark M.D.   On: 11/25/2023 17:47   CT Cervical Spine Wo Contrast Result Date: 11/25/2023 CLINICAL DATA:  Head trauma. EXAM: CT HEAD WITHOUT CONTRAST CT CERVICAL SPINE WITHOUT CONTRAST TECHNIQUE: Multidetector CT imaging of the head and cervical spine was performed following the standard protocol without intravenous contrast. Multiplanar CT image reconstructions of the cervical spine were also generated. RADIATION DOSE REDUCTION: This exam was performed according to the departmental dose-optimization program which includes automated exposure  control, adjustment of the mA and/or kV according to patient size and/or use of iterative reconstruction technique. COMPARISON:  CT dated 10/02/2023. FINDINGS: Evaluation is very limited due to severe motion artifact. CT HEAD FINDINGS Brain: Right frontal subdural collection measures approximately 8 mm in thickness and demonstrates a higher attenuation than the prior CT concerning for an acute subdural hemorrhage on prior hygroma. Smaller left frontal subdural collection. There is mild mass effect on the right frontal lobe parenchyma. No significant midline shift. Vascular: No hyperdense vessel or unexpected calcification. Skull: Normal. Negative for fracture or focal lesion. Sinuses/Orbits: The visualized paranasal sinuses and the left mastoid air cells are clear. Mild right mastoid effusion. Other: None CT CERVICAL SPINE FINDINGS Alignment: No acute subluxation. Skull base and vertebrae: No acute fracture. Soft tissues and spinal canal: No prevertebral fluid or swelling. No visible canal hematoma. Disc levels:  No acute findings. Upper chest: Negative. Other: None IMPRESSION: 1. 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 Smaller left frontal subdural collection. No significant midline shift. 2. No acute/traumatic cervical spine pathology. These results were called by telephone at the time of interpretation on 11/25/2023 at 5:43 pm to provider ANDREW TEE , who verbally acknowledged these results. Electronically Signed   By: Augie Leaven  Radparvar M.D.   On: 11/25/2023 17:47    Microbiology: Results for orders placed or performed during the hospital encounter of 11/25/23  MRSA Next Gen by PCR, Nasal     Status: None   Collection Time: 11/25/23 11:00 PM   Specimen: Nasal Mucosa; Nasal Swab  Result Value Ref Range Status   MRSA by PCR Next Gen NOT DETECTED NOT DETECTED Final    Comment: (NOTE) The GeneXpert MRSA Assay (FDA approved for NASAL specimens only), is  one component of a comprehensive MRSA colonization surveillance program. It is not intended to diagnose MRSA infection nor to guide or monitor treatment for MRSA infections. Test performance is not FDA approved in patients less than 11 years old. Performed at [facility], [address] [place], [place] [address]     Labs: CBC: Recent Labs  Lab 11/25/23 1720 11/26/23 0423 11/27/23 0437 11/28/23 0101  WBC 18.3* 14.9* 8.7 6.4  NEUTROABS 16.2*  --   --   --   HGB 11.6* 10.7* 9.4* 8.3*  HCT 33.3* 30.6* 28.2* 24.5*  MCV 94.1 93.6 97.9 96.8  PLT 473* 287 225 252   Basic Metabolic Panel: Recent Labs  Lab 11/25/23 1720 11/25/23 2253 11/26/23 0423 11/26/23 0952 11/26/23 1623 11/27/23 0437 11/28/23 0101 11/28/23 1228  NA 115*   < > 124* 125* 130* 131* 132* 131*  K 3.5   < > 3.3* 3.0* 3.3* 3.4* 2.9* 3.1*  CL 82*   < > 92* 94* 96* 98 102 99  CO2 16*   < > 21* 22 23 24 25 23   GLUCOSE 113*   < > 116* 128* 95 91 133* 146*  BUN 6   < > 5* 5* 6 7 7 6   CREATININE 0.95   < > 0.52* 0.50* 0.49* 0.49* 0.59* 0.53*  CALCIUM  7.5*   < > 7.3* 7.4* 7.7* 7.4* 7.4* 7.7*  MG 1.3*  --  1.8  --   --   --   --   --   PHOS  --   --  3.3  --   --   --   --   --    < > = values in this interval not displayed.   Liver Function Tests: Recent Labs  Lab 11/25/23 1720 11/27/23 0437 11/28/23 0101  AST 788* 534* 315*  ALT 182* 177* 141*  ALKPHOS 250* 175* 165*  BILITOT 5.8* 3.4* 3.4*  PROT 6.2* 4.3* 4.4*  ALBUMIN  2.0* <1.5* 1.9*   Discharge time spent: greater than 30 minutes.  Signed: Justina Oman, MD [facility] 11/28/2023

## 2023-11-28 NOTE — Evaluation (Signed)
 Physical Therapy Evaluation Patient Details Name: Connor Copeland MRN: [id number] DOB: [birth year] Today's Date: 11/28/2023  History of Present Illness  BRYSTEN REISTER is a 48 y.o. male with medical history significant of chronic inflammatory demyelinating polyneuropathy, alcohol  disorder seizure, gout who presents to the emergency department from home via EMS due to a fall sustained at home.  Patient was confused and was unable to provide history, history was obtained from ED physician and ED medical record..  Apparently, patient sustained a mechanical fall and denies hitting his head with complaint of hands and feet pain from his chronic inflammatory condition.   Clinical Impression  Patient requires increased with frequent rest breaks for sitting up at bedside due to c/o pain over buttocks when sitting, had difficulty completing sit to stands due to BLE weakness, once standing able to walk short distances without AD, but required use of RW for walking in hallway without loss of balance and tolerated sitting up in chair after therapy. Patient will benefit from continued skilled physical therapy in hospital and recommended venue below to increase strength, balance, endurance for safe ADLs and gait.         If plan is discharge home, recommend the following: A little help with walking and/or transfers;A little help with bathing/dressing/bathroom;Help with stairs or ramp for entrance;Assistance with cooking/housework   Can travel by private vehicle        Equipment Recommendations None recommended by PT  Recommendations for Other Services       Functional Status Assessment Patient has had a recent decline in their functional status and demonstrates the ability to make significant improvements in function in a reasonable and predictable amount of time.     Precautions / Restrictions Precautions Precautions: Fall Restrictions Weight Bearing Restrictions Per Provider Order: No       Mobility  Bed Mobility Overal bed mobility: Needs Assistance Bed Mobility: Supine to Sit     Supine to sit: Min assist, Mod assist     General bed mobility comments: limited mostly due to pain over buttocks when sitting, scooting forward    Transfers Overall transfer level: Needs assistance Equipment used: Rolling walker (2 wheels), None Transfers: Sit to/from Stand, Bed to chair/wheelchair/BSC Sit to Stand: Min assist   Step pivot transfers: Min assist       General transfer comment: has difficulty completing sit to stands, but once one feet able to maintain standing balance without AD    Ambulation/Gait Ambulation/Gait assistance: Supervision, Contact guard assist Gait Distance (Feet): 150 Feet Assistive device: Rolling walker (2 wheels) Gait Pattern/deviations: Decreased step length - left, Decreased stance time - right, Decreased stride length Gait velocity: decreased     General Gait Details: able to walk short distances without AD, but safer using RW, demonstrates slightly labored movement with good return for ambulating in room, hallway witout loss of balance using RW  Stairs            Wheelchair Mobility     Tilt Bed    Modified Rankin (Stroke Patients Only)       Balance Overall balance assessment: Needs assistance Sitting-balance support: Feet supported, No upper extremity supported Sitting balance-Leahy Scale: Fair Sitting balance - Comments: fair/good seated at EOB   Standing balance support: During functional activity, No upper extremity supported Standing balance-Leahy Scale: Poor Standing balance comment: fair/poor without AD, fair/good using RW  Pertinent Vitals/Pain Pain Assessment Pain Assessment: Faces Faces Pain Scale: Hurts even more Pain Location: BUE, buttocks, legs Pain Descriptors / Indicators: Discomfort, Grimacing, Sore, Guarding, Sharp Pain Intervention(s): Limited  activity within patient's tolerance, Monitored during session, Repositioned    Home Living Family/patient expects to be discharged to:: Private residence Living Arrangements: Parent Available Help at Discharge: Family;Available PRN/intermittently Type of Home: House Home Access: Stairs to enter Entrance Stairs-Rails: Right Entrance Stairs-Number of Steps: 2 in front, 6 at back   Home Layout: One level;Laundry or work area in [employer] Equipment: Jeananne Mighty - single point;Grab bars - Gaffer (4 wheels)      Prior Function Prior Level of Function : Independent/Modified Independent             Mobility Comments: Community ambulation without AD ADLs Comments: Independent     Extremity/Trunk Assessment   Upper Extremity Assessment Upper Extremity Assessment: Generalized weakness    Lower Extremity Assessment Lower Extremity Assessment: Generalized weakness    Cervical / Trunk Assessment Cervical / Trunk Assessment: Normal  Communication   Communication Communication: No apparent difficulties    Cognition Arousal: Alert Behavior During Therapy: WFL for tasks assessed/performed   PT - Cognitive impairments: No apparent impairments                         Following commands: Intact       Cueing Cueing Techniques: Verbal cues, Tactile cues     General Comments      Exercises     Assessment/Plan    PT Assessment Patient needs continued PT services  PT Problem List Decreased strength;Decreased activity tolerance;Decreased balance;Decreased mobility;Pain       PT Treatment Interventions DME instruction;Gait training;Stair training;Functional mobility training;Therapeutic activities;Therapeutic exercise;Balance training;Patient/family education    PT Goals (Current goals can be found in the Care Plan section)  Acute Rehab PT Goals Patient Stated Goal: return home with family to assist PT Goal Formulation: With  patient Time For Goal Achievement: 12/03/23 Potential to Achieve Goals: Good    Frequency Min 3X/week     Co-evaluation               AM-PAC PT "6 Clicks" Mobility  Outcome Measure Help needed turning from your back to your side while in a flat bed without using bedrails?: A Little Help needed moving from lying on your back to sitting on the side of a flat bed without using bedrails?: A Lot Help needed moving to and from a bed to a chair (including a wheelchair)?: A Little Help needed standing up from a chair using your arms (e.g., wheelchair or bedside chair)?: A Little Help needed to walk in hospital room?: A Little Help needed climbing 3-5 steps with a railing? : A Lot 6 Click Score: 16    End of Session   Activity Tolerance: Patient tolerated treatment well;Patient limited by fatigue Patient left: in chair;with call bell/phone within reach Nurse Communication: Mobility status PT Visit Diagnosis: Unsteadiness on feet (R26.81);Other abnormalities of gait and mobility (R26.89);Muscle weakness (generalized) (M62.81)    Time: 4098-1191 PT Time Calculation (min) (ACUTE ONLY): 38 min   Charges:   PT Evaluation $PT Eval High Complexity: 1 High PT Treatments $Therapeutic Exercise: 38-52 mins PT General Charges $$ ACUTE PT VISIT: 1 Visit         2:32 PM, 11/28/23 Walton Guppy, MPT Physical Therapist with [facility] 336 [phone] office [phone] mobile phone

## 2023-11-28 NOTE — TOC Transition Note (Addendum)
 Transition of Care [facility]) - Discharge Note   Patient Details  Name: Connor Copeland MRN: [id number] Date of Birth: [birth year]  Transition of Care [facility]) CM/SW Contact:  Orelia Binet, RN Phone Number: 11/28/2023, 11:10 AM   Clinical Narrative:   Patient discharging home. PT is recommending HHPT. CM at the bedside, patient is agreeable. He has been walking with a walker. CMS choice given.  MD aware to order.  TOC consulted for Substance abuse resources. Patient states he went several years ago to [facility]. Resources given and discussed. [facility] is local and added to AVS. Patient state he will check them out. He is ready for discharge. MD aware.     Final next level of care: Home w Home Health Services Barriers to Discharge: Barriers Resolved  patient Goals and CMS Choice Patient states their goals for this hospitalization and ongoing recovery are:: return home       Discharge Placement                  Patient and family notified of of transfer: 11/28/23  Discharge Plan and Services Additional resources added to the After Visit Summary for     Social Drivers of Health (SDOH) Interventions SDOH Screenings   Food Insecurity: Patient Unable To Answer (11/26/2023)  Housing: Patient Unable To Answer (11/26/2023)  Transportation Needs: Patient Unable To Answer (11/26/2023)  Utilities: Patient Unable To Answer (11/26/2023)  Tobacco Use: High Risk (11/25/2023)     Readmission Risk Interventions    11/26/2023    1:55 PM 08/19/2023   10:48 AM  Readmission Risk Prevention Plan  Transportation Screening Complete Complete  PCP or Specialist Appt within 3-5 Days  Not Complete  HRI or Home Care Consult  Complete  Social Work Consult for Recovery Care Planning/Counseling  Complete  Palliative Care Screening  Not Applicable  Medication Review Oceanographer) Complete Complete  PCP or Specialist appointment within 3-5 days of discharge Not Complete   HRI or Home Care Consult  Complete   SW Recovery Care/Counseling Consult Complete   Palliative Care Screening Not Applicable   Skilled Nursing Facility Not Applicable

## 2023-11-28 NOTE — TOC Progression Note (Signed)
 Transition of Care [facility]) - Progression Note    Patient Details  Name: Connor Copeland MRN: [id number] Date of Birth: 02-Jul-1976  Transition [facility] Care [facility]) CM/SW Contact  Orelia Binet, RN Phone Number: 11/28/2023, 11:27 AM  Clinical Narrative:   Patient now saying he was active with [facility] in the past and requesting them to return if possible. Referral sent to Mariah Shines, She accepted. He will go to outpatient PT when he gets stronger.     Expected Discharge Plan: Home/Self Care Barriers to Discharge: Barriers Resolved  Expected Discharge Plan and Services                                     [facility] Agency: [facility] Date [facility] Agency Contacted: 11/28/23 Time HH Agency Contacted: 1127 Representative spoke with at [facility] Agency: Mariah Shines   Social Determinants of Health (SDOH) Interventions SDOH Screenings   Food Insecurity: Patient Unable To Answer (11/26/2023)  Housing: Patient Unable To Answer (11/26/2023)  Transportation Needs: Patient Unable To Answer (11/26/2023)  Utilities: Patient Unable To Answer (11/26/2023)  Tobacco Use: High Risk (11/25/2023)    Readmission Risk Interventions    11/26/2023    1:55 PM 08/19/2023   10:48 AM  Readmission Risk Prevention Plan  Transportation Screening Complete Complete  PCP or Specialist Appt within 3-5 Days  Not Complete  HRI or Home Care Consult  Complete  Social Work Consult for Recovery Care Planning/Counseling  Complete  Palliative Care Screening  Not Applicable  Medication Review Oceanographer) Complete Complete  PCP or Specialist appointment within 3-5 days of discharge Not Complete   HRI or Home Care Consult Complete   SW Recovery Care/Counseling Consult Complete   Palliative Care Screening Not Applicable   Skilled Nursing Facility Not Applicable

## 2023-11-28 NOTE — Plan of Care (Signed)
  Problem: Acute Rehab PT Goals(only PT should resolve) Goal: Pt Will Go Supine/Side To Sit Outcome: Progressing Flowsheets (Taken 11/28/2023 1433) Pt will go Supine/Side to Sit:  with supervision  with modified independence Goal: Patient Will Transfer Sit To/From Stand Outcome: Progressing Flowsheets (Taken 11/28/2023 1433) Patient will transfer sit to/from stand:  with modified independence  with supervision Goal: Pt Will Transfer Bed To Chair/Chair To Bed Outcome: Progressing Flowsheets (Taken 11/28/2023 1433) Pt will Transfer Bed to Chair/Chair to Bed:  with modified independence  with supervision Goal: Pt Will Ambulate Outcome: Progressing Flowsheets (Taken 11/28/2023 1433) Pt will Ambulate:  > 125 feet  with modified independence  with rolling walker  with least restrictive assistive device   2:33 PM, 11/28/23 Walton Guppy, MPT Physical Therapist with [facility] [phone] office [phone] mobile phone

## 2023-11-28 NOTE — Progress Notes (Signed)
 0730 patient asking for pain medication ATC stating 10/10 pain refusing mobility. Patient alert x4 able to make all needs known on room air. 0800 patient unable to stand with max assist to get to chair needs PT order hands still swollen patient doesn't want to use them teaching on how to reduce swelling unsuccessful.

## 2023-11-28 NOTE — Plan of Care (Signed)

## 2023-12-01 ENCOUNTER — Inpatient Hospital Stay: Admitting: Oncology

## 2023-12-01 ENCOUNTER — Inpatient Hospital Stay

## 2023-12-10 ENCOUNTER — Inpatient Hospital Stay: Admitting: Oncology

## 2023-12-10 ENCOUNTER — Inpatient Hospital Stay

## 2023-12-16 ENCOUNTER — Inpatient Hospital Stay: Admitting: Oncology

## 2023-12-16 ENCOUNTER — Inpatient Hospital Stay

## 2023-12-24 ENCOUNTER — Inpatient Hospital Stay: Attending: Oncology | Admitting: Oncology

## 2023-12-24 ENCOUNTER — Inpatient Hospital Stay

## 2023-12-24 VITALS — BP 119/82 | HR 90 | Temp 97.9°F | Resp 18 | Ht 74.0 in | Wt 195.3 lb

## 2023-12-24 DIAGNOSIS — F1011 Alcohol abuse, in remission: Secondary | ICD-10-CM | POA: Insufficient documentation

## 2023-12-24 DIAGNOSIS — D649 Anemia, unspecified: Secondary | ICD-10-CM

## 2023-12-24 DIAGNOSIS — D509 Iron deficiency anemia, unspecified: Secondary | ICD-10-CM | POA: Diagnosis present

## 2023-12-24 DIAGNOSIS — F1091 Alcohol use, unspecified, in remission: Secondary | ICD-10-CM

## 2023-12-24 DIAGNOSIS — G6181 Chronic inflammatory demyelinating polyneuritis: Secondary | ICD-10-CM

## 2023-12-24 DIAGNOSIS — D539 Nutritional anemia, unspecified: Secondary | ICD-10-CM

## 2023-12-24 LAB — COMPREHENSIVE METABOLIC PANEL WITH GFR
ALT: 22 U/L (ref 0–44)
AST: 40 U/L (ref 15–41)
Albumin: 1.9 g/dL — ABNORMAL LOW (ref 3.5–5.0)
Alkaline Phosphatase: 165 U/L — ABNORMAL HIGH (ref 38–126)
Anion gap: 3 — ABNORMAL LOW (ref 5–15)
BUN: 8 mg/dL (ref 6–20)
CO2: 24 mmol/L (ref 22–32)
Calcium: 8.1 mg/dL — ABNORMAL LOW (ref 8.9–10.3)
Chloride: 104 mmol/L (ref 98–111)
Creatinine, Ser: 0.5 mg/dL — ABNORMAL LOW (ref 0.61–1.24)
GFR, Estimated: >60 mL/min (ref >60)
Glucose, Bld: 93 mg/dL (ref 70–99)
Potassium: 3.5 mmol/L (ref 3.5–5.1)
Sodium: 131 mmol/L — ABNORMAL LOW (ref 135–145)
Total Bilirubin: 1.1 mg/dL (ref 0.0–1.2)
Total Protein: 7.5 g/dL (ref 6.5–8.1)

## 2023-12-24 LAB — CBC WITH DIFFERENTIAL/PLATELET
Abs Immature Granulocytes: 0.04 10*3/uL (ref 0.00–0.07)
Basophils Absolute: 0 10*3/uL (ref 0.0–0.1)
Basophils Relative: 0 %
Eosinophils Absolute: 0.1 10*3/uL (ref 0.0–0.5)
Eosinophils Relative: 1 %
HCT: 34.8 % — ABNORMAL LOW (ref 39.0–52.0)
Hemoglobin: 11.2 g/dL — ABNORMAL LOW (ref 13.0–17.0)
Immature Granulocytes: 1 %
Lymphocytes Relative: 28 %
Lymphs Abs: 2.1 10*3/uL (ref 0.7–4.0)
MCH: 31.9 pg (ref 26.0–34.0)
MCHC: 32.2 g/dL (ref 30.0–36.0)
MCV: 99.1 fL (ref 80.0–100.0)
Monocytes Absolute: 0.6 10*3/uL (ref 0.1–1.0)
Monocytes Relative: 8 %
Neutro Abs: 4.9 10*3/uL (ref 1.7–7.7)
Neutrophils Relative %: 62 %
Platelets: 430 10*3/uL — ABNORMAL HIGH (ref 150–400)
RBC: 3.51 MIL/uL — ABNORMAL LOW (ref 4.22–5.81)
RDW: 15.7 % — ABNORMAL HIGH (ref 11.5–15.5)
WBC: 7.7 10*3/uL (ref 4.0–10.5)
nRBC: 0 % (ref 0.0–0.2)

## 2023-12-24 LAB — VITAMIN B12: Vitamin B-12: 856 pg/mL (ref 180–914)

## 2023-12-24 LAB — IRON AND TIBC
Iron: 31 ug/dL — ABNORMAL LOW (ref 45–182)
Saturation Ratios: 17 % — ABNORMAL LOW (ref 17.9–39.5)
TIBC: 180 ug/dL — ABNORMAL LOW (ref 250–450)
UIBC: 149 ug/dL

## 2023-12-24 LAB — FERRITIN: Ferritin: 281 ng/mL (ref 24–336)

## 2023-12-24 LAB — FOLATE: Folate: 14.7 ng/mL (ref >5.9)

## 2023-12-24 NOTE — Progress Notes (Signed)
 [facility]  HEMATOLOGY NEW VISIT  Omie Bickers, MD  REASON FOR REFERRAL: Macrocyclic anemia  HISTORY OF PRESENT ILLNESS: Connor Copeland 48 y.o. male referred for microcytic anemia.   Patient has a history of chronic inflammatory demyelinating polyneuropathy, alcohol  seizure disorder, gout was recently admitted to the hospital on 11/25/2023 for fall sustained at home.  He had a subdural hematoma at that time and also had hyponatremia likely secondary to beer potomania and dehydration.  He also had elevated ammonia level with hepatic encephalopathy.  He experiences significant fatigue and lack of stamina, recently managing to walk to the mailbox and back for the first time since a hospital admission. He lives alone in [place], is on disability, and quit drinking alcohol  over a month ago and stopped smoking a year ago. He was hospitalized last month for altered mental status and a fall, with low sodium levels noted during that admission. He has not consumed alcohol  since then.  He reports poor balance and occasional dizziness, with an incident of nearly blacking out while eating. He experiences numbness and tingling in his hands and feet. No abdominal pain or weight loss. He was previously diagnosed with type 2 diabetes but stopped taking metformin  due to gastrointestinal side effects. His last hemoglobin A1c was 8.9 in January, but he recalls a more recent result of 4.  He has a history of seizures, with two episodes in the past year, but none in the last month since quitting alcohol . No family history of colon cancer, myeloma, or other cancers. He reports pain in his hands but no abdominal pain.  I have reviewed the past medical history, past surgical history, social history and family history with the patient   ALLERGIES:  is allergic to neurontin [gabapentin].  MEDICATIONS:  Current Outpatient Medications  Medication Sig Dispense Refill    diltiazem  (CARDIZEM  CD) 120 MG 24 hr capsule Take 1 capsule (120 mg total) by mouth daily. 30 capsule 0   DULoxetine  (CYMBALTA ) 60 MG capsule Take 2 capsules (120 mg total) by mouth daily. 60 capsule 0   febuxostat  (ULORIC ) 40 MG tablet Take 1 tablet (40 mg total) by mouth daily. 30 tablet 0   fluticasone (FLONASE) 50 MCG/ACT nasal spray Place 1 spray into both nostrils daily.     folic acid  (FOLVITE ) 1 MG tablet Take 1 tablet (1 mg total) by mouth daily. 30 tablet 0   HYDROcodone -acetaminophen  (NORCO) 7.5-325 MG tablet Take 1 tablet by mouth 3 (three) times daily as needed. (Patient taking differently: Take 1 tablet by mouth 3 (three) times daily as needed for moderate pain (pain score 4-6).) 30 tablet 0   Lactulose  20 GM/30ML SOLN Take 30 mLs (20 g total) by mouth daily. 450 mL 2   magnesium  oxide (MAG-OX) 400 (240 Mg) MG tablet Take 1 tablet (400 mg total) by mouth 2 (two) times daily. 60 tablet 0   magnesium  oxide (MAGOX 400) 400 (240 Mg) MG tablet Take 1 tablet (400 mg total) by mouth daily. 30 tablet 1   metFORMIN  (GLUCOPHAGE ) 500 MG tablet Take 500 mg by mouth 2 (two) times daily.     Multiple Vitamin (MULTIVITAMIN WITH MINERALS) TABS tablet Take 1 tablet by mouth daily.     neomycin -bacitracin -polymyxin (NEOSPORIN) OINT Apply topically twice a day to affected skin lesions. 14 g 0   nicotine  (NICODERM CQ  - DOSED IN MG/24 HOURS) 21 mg/24hr patch 21 mg patch daily for 1 week then 14 mg  patch daily for 3 weeks then 7 mg patch daily for 3 weeks and stop 28 patch 1   nortriptyline  (PAMELOR ) 25 MG capsule Take 2 capsules (50 mg total) by mouth at bedtime. 60 capsule 0   Oxcarbazepine  (TRILEPTAL ) 300 MG tablet 300 milligrams daily and 600 mg nightly 90 tablet 0   pantoprazole  (PROTONIX ) 40 MG tablet Take 1 tablet (40 mg total) by mouth daily. 30 tablet 1   potassium chloride  SA (KLOR-CON  M) 20 MEQ tablet Take 1 tablet (20 mEq total) by mouth daily. 30 tablet 0   predniSONE  (DELTASONE ) 10 MG tablet  Take 10 mg by mouth daily.     thiamine  (VITAMIN B1) 100 MG tablet Take 1 tablet (100 mg total) by mouth daily. 30 tablet 0   No current facility-administered medications for this visit.     REVIEW OF SYSTEMS:   Constitutional: Denies fevers, chills or night sweats Eyes: Denies blurriness of vision Ears, nose, mouth, throat, and face: Denies mucositis or sore throat Respiratory: Denies cough, dyspnea or wheezes Cardiovascular: Denies palpitation, chest discomfort or lower extremity swelling Gastrointestinal:  Denies nausea, heartburn or change in bowel habits Skin: Denies abnormal skin rashes Lymphatics: Denies new lymphadenopathy or easy bruising Neurological:Denies numbness, tingling or new weaknesses Behavioral/Psych: Mood is stable, no new changes  All other systems were reviewed with the patient and are negative.  PHYSICAL EXAMINATION:   Vitals:   12/24/23 1312  BP: 119/82  Pulse: 90  Resp: 18  Temp: 97.9 F (36.6 C)  SpO2: 100%    GENERAL:alert, no distress and comfortable SKIN: skin color, texture, turgor are normal, no rashes or significant lesions LUNGS: clear to auscultation and percussion with normal breathing effort HEART: regular rate & rhythm and no murmurs and no lower extremity edema ABDOMEN:abdomen soft, non-tender and normal bowel sounds Musculoskeletal:no cyanosis of digits and no clubbing  NEURO: alert & oriented x 3 with fluent speech  LABORATORY DATA:  I have reviewed the data as listed Labs from 12/11/2023: CBC: WBC: 6.2, hemoglobin: 10.6, hematocrit: 31.7, MCV: 101, platelets: 423 CMP: Creatinine: 0.5, BUN: 5, GFR: 123, sodium: 155, potassium: 5.6, calcium : 8.4, albumin : 2.5, bilirubin: 1.5, alkaline phosphatase: 230, ALT: 37, AST: 58  Labs from 11/11/2023: TIBC: 93, iron: 59, percent sat: 63, vitamin B12: > 2000  Lab Results  Component Value Date   WBC 7.7 12/24/2023   NEUTROABS 4.9 12/24/2023   HGB 11.2 (L) 12/24/2023   HCT 34.8 (L)  12/24/2023   MCV 99.1 12/24/2023   PLT 430 (H) 12/24/2023      Chemistry      Component Value Date/Time   NA 131 (L) 11/28/2023 1228   K 3.1 (L) 11/28/2023 1228   CL 99 11/28/2023 1228   CO2 23 11/28/2023 1228   BUN 6 11/28/2023 1228   CREATININE 0.53 (L) 11/28/2023 1228      Component Value Date/Time   CALCIUM  7.7 (L) 11/28/2023 1228   ALKPHOS 165 (H) 11/28/2023 0101   AST 315 (H) 11/28/2023 0101   ALT 141 (H) 11/28/2023 0101   BILITOT 3.4 (H) 11/28/2023 0101       RADIOGRAPHIC STUDIES: I have personally reviewed the radiological images as listed and agreed with the findings in the report.  CT Head Wo Contrast CLINICAL DATA:  Follow-up intracranial hemorrhage  EXAM: CT HEAD WITHOUT CONTRAST  TECHNIQUE: Contiguous axial images were obtained from the base of the skull through the vertex without intravenous contrast.  RADIATION DOSE REDUCTION: This exam was performed  according to the departmental dose-optimization program which includes automated exposure control, adjustment of the mA and/or kV according to patient size and/or use of iterative reconstruction technique.  COMPARISON:  11/25/2023  FINDINGS: Brain: Unchanged right convexity mixed density subdural hematoma, approximately 11 mm thick. No new site of hemorrhage. No midline shift.  Vascular: No hyperdense vessel or unexpected vascular calcification.  Skull: The visualized skull base, calvarium and extracranial soft tissues are normal.  Sinuses/Orbits: No fluid levels or advanced mucosal thickening of the visualized paranasal sinuses. No mastoid or middle ear effusion. Normal orbits.  Other: None.  IMPRESSION: Unchanged right convexity mixed density subdural hematoma, approximately 11 mm thick. No new site of hemorrhage.  Electronically Signed   By: Juanetta Nordmann M.D.   On: 11/26/2023 02:22   ASSESSMENT & PLAN:  Patient is a 48 y.o. male referred for macrocytic anemia  Macrocytic  anemia Patient has macrocytic anemia with an MCV of 101 Differential diagnosis includes alcohol  use, vitamin B12 or folate deficiency Iron panel done last month was consistent with anemia of chronic inflammation  -Will obtain labs today including iron panel, ferritin, vitamin B12 and folate levels - Also workup for multiple myeloma - Patient is 48 years old and is due for his screening colonoscopy.  Return to clinic in 2 weeks to discuss results and further management  Chronic inflammatory demyelinating neuropathy (HCC) Patient has a history of chronic inflammatory demyelinating neuropathy - Receiving frequent IVIG for treatment  Alcohol  use disorder in remission Patient was a previous heavy alcohol  user. Was admitted to the for hyponatremia, beer potomania last month Has quit alcohol  since then  - Continue to abstain from alcohol    Orders Placed This Encounter  Procedures   Ferritin    Standing Status:   Future    Number of Occurrences:   1    Expected Date:   12/24/2023    Expiration Date:   12/23/2024   Folate    Standing Status:   Future    Number of Occurrences:   1    Expected Date:   12/24/2023    Expiration Date:   12/23/2024   Vitamin B12    Standing Status:   Future    Number of Occurrences:   1    Expected Date:   12/24/2023    Expiration Date:   12/23/2024   CBC with Differential/Platelet    Standing Status:   Future    Number of Occurrences:   1    Expected Date:   12/24/2023    Expiration Date:   12/23/2024   Comprehensive metabolic panel with GFR    Standing Status:   Future    Number of Occurrences:   1    Expected Date:   12/24/2023    Expiration Date:   12/23/2024   Multiple Myeloma Panel (SPEP&IFE w/QIG)    Standing Status:   Future    Number of Occurrences:   1    Expected Date:   12/24/2023    Expiration Date:   12/23/2024   Kappa/lambda light chains    Standing Status:   Future    Number of Occurrences:   1    Expected Date:   12/24/2023     Expiration Date:   12/23/2024   Iron and TIBC    Standing Status:   Future    Number of Occurrences:   1    Expected Date:   12/24/2023    Expiration Date:   12/23/2024  The total time spent in the appointment was 40 minutes encounter with patients including review of chart and various tests results, discussions about plan of care and coordination of care plan   All questions were answered. The patient knows to call the clinic with any problems, questions or concerns. No barriers to learning was detected.   Eduardo Grade, MD 5/21/20251:54 PM

## 2023-12-24 NOTE — Patient Instructions (Signed)
 VISIT SUMMARY:  You came in today for a blood work evaluation due to anemia. We discussed your symptoms, including fatigue, poor balance, and numbness in your hands and feet. We also reviewed your history of type 2 diabetes, seizure disorder, and alcohol  use disorder, which is now in remission.  YOUR PLAN:  -ANEMIA: Anemia is a condition where you don't have enough healthy red blood cells to carry adequate oxygen to your body's tissues. We will conduct a nutritional workup, including tests for iron, vitamin B12, and folate levels, and also check for multiple myeloma. We will repeat your blood work to assess your current status and consider a colonoscopy if iron deficiency is confirmed. We will reassess in two weeks to discuss the lab results and next steps.  -ALCOHOL  USE DISORDER, IN REMISSION: Alcohol  use disorder is a medical condition characterized by an inability to stop or control alcohol  use. You have been in remission for over a month. We will monitor for any resolution of alcohol -related anemia.   INSTRUCTIONS:  Please schedule a follow-up appointment in two weeks to discuss your lab results and determine the next steps.

## 2023-12-24 NOTE — Assessment & Plan Note (Signed)
 Patient was a previous heavy alcohol  user. Was admitted to the for hyponatremia, beer potomania last month Has quit alcohol  since then  - Continue to abstain from alcohol 

## 2023-12-24 NOTE — Assessment & Plan Note (Signed)
 Patient has macrocytic anemia with an MCV of 101 Differential diagnosis includes alcohol  use, vitamin B12 or folate deficiency Iron panel done last month was consistent with anemia of chronic inflammation  -Will obtain labs today including iron panel, ferritin, vitamin B12 and folate levels - Also workup for multiple myeloma - Patient is 48 years old and is due for his screening colonoscopy.  Return to clinic in 2 weeks to discuss results and further management

## 2023-12-24 NOTE — Assessment & Plan Note (Signed)
 Patient has a history of chronic inflammatory demyelinating neuropathy - Receiving frequent IVIG for treatment

## 2023-12-25 ENCOUNTER — Other Ambulatory Visit: Payer: Self-pay | Admitting: *Deleted

## 2023-12-25 DIAGNOSIS — D539 Nutritional anemia, unspecified: Secondary | ICD-10-CM

## 2023-12-25 DIAGNOSIS — G6181 Chronic inflammatory demyelinating polyneuritis: Secondary | ICD-10-CM

## 2023-12-25 LAB — KAPPA/LAMBDA LIGHT CHAINS
Kappa free light chain: 52.1 mg/L — ABNORMAL HIGH (ref 3.3–19.4)
Kappa, lambda light chain ratio: 1.21 (ref 0.26–1.65)
Lambda free light chains: 43.2 mg/L — ABNORMAL HIGH (ref 5.7–26.3)

## 2023-12-27 LAB — MISC LABCORP TEST (SEND OUT): Labcorp test code: 143305

## 2023-12-28 LAB — MULTIPLE MYELOMA PANEL, SERUM
Albumin SerPl Elph-Mcnc: 2.2 g/dL — ABNORMAL LOW (ref 2.9–4.4)
Albumin/Glob SerPl: 0.5 — ABNORMAL LOW (ref 0.7–1.7)
Alpha 1: 0.4 g/dL (ref 0.0–0.4)
Alpha2 Glob SerPl Elph-Mcnc: 0.6 g/dL (ref 0.4–1.0)
B-Globulin SerPl Elph-Mcnc: 0.9 g/dL (ref 0.7–1.3)
Gamma Glob SerPl Elph-Mcnc: 3.3 g/dL — ABNORMAL HIGH (ref 0.4–1.8)
Globulin, Total: 5.2 g/dL — ABNORMAL HIGH (ref 2.2–3.9)
IgA: 464 mg/dL — ABNORMAL HIGH (ref 90–386)
IgG (Immunoglobin G), Serum: 3561 mg/dL — ABNORMAL HIGH (ref 603–1613)
IgM (Immunoglobulin M), Srm: 115 mg/dL (ref 20–172)
Total Protein ELP: 7.4 g/dL (ref 6.0–8.5)

## 2024-01-05 ENCOUNTER — Emergency Department (HOSPITAL_COMMUNITY)

## 2024-01-05 ENCOUNTER — Emergency Department (HOSPITAL_COMMUNITY)
Admission: EM | Admit: 2024-01-05 | Discharge: 2024-01-05 | Disposition: A | Discharge status: Home / Self Care | Attending: Emergency Medicine | Admitting: Emergency Medicine

## 2024-01-05 ENCOUNTER — Other Ambulatory Visit: Payer: Self-pay

## 2024-01-05 ENCOUNTER — Encounter (HOSPITAL_COMMUNITY): Payer: Self-pay | Admitting: Emergency Medicine

## 2024-01-05 DIAGNOSIS — M79601 Pain in right arm: Secondary | ICD-10-CM | POA: Diagnosis not present

## 2024-01-05 DIAGNOSIS — M79672 Pain in left foot: Secondary | ICD-10-CM | POA: Insufficient documentation

## 2024-01-05 DIAGNOSIS — F1011 Alcohol abuse, in remission: Secondary | ICD-10-CM

## 2024-01-05 DIAGNOSIS — R52 Pain, unspecified: Secondary | ICD-10-CM

## 2024-01-05 DIAGNOSIS — E871 Hypo-osmolality and hyponatremia: Secondary | ICD-10-CM | POA: Diagnosis not present

## 2024-01-05 DIAGNOSIS — G6181 Chronic inflammatory demyelinating polyneuritis: Secondary | ICD-10-CM

## 2024-01-05 LAB — COMPREHENSIVE METABOLIC PANEL WITH GFR
ALT: 16 U/L (ref 0–44)
AST: 32 U/L (ref 15–41)
Albumin: 2.1 g/dL — ABNORMAL LOW (ref 3.5–5.0)
Alkaline Phosphatase: 136 U/L — ABNORMAL HIGH (ref 38–126)
Anion gap: 11 (ref 5–15)
BUN: 9 mg/dL (ref 6–20)
CO2: 22 mmol/L (ref 22–32)
Calcium: 8.5 mg/dL — ABNORMAL LOW (ref 8.9–10.3)
Chloride: 97 mmol/L — ABNORMAL LOW (ref 98–111)
Creatinine, Ser: 0.9 mg/dL (ref 0.61–1.24)
GFR, Estimated: >60 mL/min (ref >60)
Glucose, Bld: 139 mg/dL — ABNORMAL HIGH (ref 70–99)
Potassium: 3.6 mmol/L (ref 3.5–5.1)
Sodium: 130 mmol/L — ABNORMAL LOW (ref 135–145)
Total Bilirubin: 1 mg/dL (ref 0.0–1.2)
Total Protein: 6.8 g/dL (ref 6.5–8.1)

## 2024-01-05 LAB — SEDIMENTATION RATE: Sed Rate: 69 mm/h — ABNORMAL HIGH (ref 0–15)

## 2024-01-05 LAB — CBC WITH DIFFERENTIAL/PLATELET
Abs Immature Granulocytes: 0.06 10*3/uL (ref 0.00–0.07)
Basophils Absolute: 0.1 10*3/uL (ref 0.0–0.1)
Basophils Relative: 1 %
Eosinophils Absolute: 0.1 10*3/uL (ref 0.0–0.5)
Eosinophils Relative: 1 %
HCT: 35.2 % — ABNORMAL LOW (ref 39.0–52.0)
Hemoglobin: 12.3 g/dL — ABNORMAL LOW (ref 13.0–17.0)
Immature Granulocytes: 1 %
Lymphocytes Relative: 23 %
Lymphs Abs: 2.9 10*3/uL (ref 0.7–4.0)
MCH: 33 pg (ref 26.0–34.0)
MCHC: 34.9 g/dL (ref 30.0–36.0)
MCV: 94.4 fL (ref 80.0–100.0)
Monocytes Absolute: 1.9 10*3/uL — ABNORMAL HIGH (ref 0.1–1.0)
Monocytes Relative: 15 %
Neutro Abs: 7.9 10*3/uL — ABNORMAL HIGH (ref 1.7–7.7)
Neutrophils Relative %: 59 %
Platelets: 544 10*3/uL — ABNORMAL HIGH (ref 150–400)
RBC: 3.73 MIL/uL — ABNORMAL LOW (ref 4.22–5.81)
RDW: 14.7 % (ref 11.5–15.5)
WBC: 12.9 10*3/uL — ABNORMAL HIGH (ref 4.0–10.5)
nRBC: 0 % (ref 0.0–0.2)

## 2024-01-05 LAB — MAGNESIUM: Magnesium: 1.4 mg/dL — ABNORMAL LOW (ref 1.7–2.4)

## 2024-01-05 LAB — ETHANOL: Alcohol, Ethyl (B): <15 mg/dL (ref <15)

## 2024-01-05 MED ORDER — HYDROMORPHONE HCL 1 MG/ML IJ SOLN
1.0000 mg | Freq: Once | INTRAMUSCULAR | Status: AC
  Administered 2024-01-05: 1 mg via INTRAVENOUS
  Filled 2024-01-05 (×2): qty 1

## 2024-01-05 MED ORDER — HYDROMORPHONE HCL 2 MG PO TABS: 1.0000 mg | ORAL_TABLET | Freq: Four times a day (QID) | ORAL | 0 refills | Status: DC | PRN

## 2024-01-05 MED ORDER — PREDNISONE 50 MG PO TABS
60.0000 mg | ORAL_TABLET | ORAL | Status: AC
  Administered 2024-01-05: 60 mg via ORAL
  Filled 2024-01-05: qty 1

## 2024-01-05 MED ORDER — PREDNISONE 20 MG PO TABS: 40.0000 mg | ORAL_TABLET | Freq: Every day | ORAL | 0 refills | Status: DC

## 2024-01-05 MED ORDER — HYDROMORPHONE HCL 1 MG/ML IJ SOLN
1.0000 mg | Freq: Once | INTRAMUSCULAR | Status: AC
  Administered 2024-01-05: 1 mg via INTRAVENOUS
  Filled 2024-01-05: qty 1

## 2024-01-05 MED ORDER — SODIUM CHLORIDE 0.9 % IV BOLUS
1000.0000 mL | Freq: Once | INTRAVENOUS | Status: AC
  Administered 2024-01-05: 1000 mL via INTRAVENOUS

## 2024-01-05 MED ORDER — KETOROLAC TROMETHAMINE 30 MG/ML IJ SOLN
15.0000 mg | Freq: Once | INTRAMUSCULAR | Status: AC
  Administered 2024-01-05: 15 mg via INTRAVENOUS
  Filled 2024-01-05: qty 1

## 2024-01-05 MED ORDER — MAGNESIUM SULFATE 2 GM/50ML IV SOLN
2.0000 g | Freq: Once | INTRAVENOUS | Status: AC
  Administered 2024-01-05: 2 g via INTRAVENOUS
  Filled 2024-01-05: qty 50

## 2024-01-05 NOTE — Progress Notes (Signed)
 Discussed with Dr. Liam Redhead.  This is a patient with a past medical history significant for CIDP plan for treatment with IVIG, alcohol  abuse complicated by alcohol  withdrawal seizures  He presents today with concern for worsening pain/weakness similar to prior symptoms of CIDP.  In review of the chart it appears that he was initially treated inpatient in early January, with IVIG 400 mg/kg for 5 days. Course was complicated by alcohol  withdrawal. He did receive IVIG outpatient 100 gm over 2 days in Feb and Mar (planned for every 4 weeks ongoing per outpatient note from 08/04/2023 visit), but I do not see any other treatment since late March. Also admitted 11/25/2023 with after a fall, found to have hyponatremia with c/f beer potomania, transaminitis, SDH.   If he is having substantially worsening weakness, especially without labs suggestive of alcoholic contribution at this time (no AST/ALT elevation), may benefit from a brief observation admission to stabilize him with IVIG while awaiting resumption of this outpatient.   If the change is relatively mild and he can follow up very closely outpatient to get back on track with infusions (or has already been getting more recent infusions that I did not see on my chart review), okay to discharge with close outpatient neurology follow-up  Do suggest continued counseling on the damage alcohol  can do to nerves that are already trying to recover from his autoimmune disease, and the importance of following closely and regularly with his outpatient neurologist and not missing outpatient IVIG treatments.   Approximately 20 min were spent in care, majority in discussion with Dr. Liam Redhead, who will notify patient of interprofessional consultation

## 2024-01-05 NOTE — ED Provider Notes (Signed)
 [place] EMERGENCY DEPARTMENT AT [facility] Provider Note   CSN: [id number] Arrival date & time: 01/05/24  1228     History  Chief Complaint  Patient presents with   Foot Pain   Hand Pain    Connor Copeland is a 48 y.o. male.  HPI Patient presents with left foot, right arm pain.  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  Over the past 3 days he has had left foot pain, today noticed pain in the right arm, prohibiting ability to completely flex and extend his wrist and hand.  No fever, no chest pain, no relief with topical medications.  He is here with his mother who assists with the history.    Home Medications Prior to Admission medications   Medication Sig Start Date End Date Taking? Authorizing Provider  HYDROmorphone  (DILAUDID ) 2 MG tablet Take 0.5 tablets (1 mg total) by mouth every 6 (six) hours as needed for severe pain (pain score 7-10). 01/05/24  Yes Dorenda Gandy, MD  predniSONE  (DELTASONE ) 20 MG tablet Take 2 tablets (40 mg total) by mouth daily with breakfast. For the next four days 01/05/24  Yes Dorenda Gandy, MD  diltiazem  (CARDIZEM  CD) 120 MG 24 hr capsule Take 1 capsule (120 mg total) by mouth daily. 08/25/23   Angiulli, Everlyn Hockey, PA-C  DULoxetine  (CYMBALTA ) 60 MG capsule Take 2 capsules (120 mg total) by mouth daily. 08/25/23   Angiulli, Everlyn Hockey, PA-C  febuxostat  (ULORIC ) 40 MG tablet Take 1 tablet (40 mg total) by mouth daily. 08/25/23   Angiulli, Everlyn Hockey, PA-C  fluticasone (FLONASE) 50 MCG/ACT nasal spray Place 1 spray into both nostrils daily. 07/15/23   [provider]  folic acid  (FOLVITE ) 1 MG tablet Take 1 tablet (1 mg total) by mouth daily. 08/25/23   Angiulli, Everlyn Hockey, PA-C  Lactulose  20 GM/30ML SOLN Take 30 mLs (20 g total) by mouth daily. 11/28/23   Justina Oman, MD  magnesium  oxide  (MAG-OX) 400 (240 Mg) MG tablet Take 1 tablet (400 mg total) by mouth 2 (two) times daily. 08/25/23   Angiulli, Everlyn Hockey, PA-C  magnesium  oxide (MAGOX 400) 400 (240 Mg) MG tablet Take 1 tablet (400 mg total) by mouth daily. 11/28/23   Justina Oman, MD  metFORMIN  (GLUCOPHAGE ) 500 MG tablet Take 500 mg by mouth 2 (two) times daily. 08/29/23   [provider]  Multiple Vitamin (MULTIVITAMIN WITH MINERALS) TABS tablet Take 1 tablet by mouth daily. 08/21/23   Angiulli, Everlyn Hockey, PA-C  neomycin -bacitracin -polymyxin (NEOSPORIN) OINT Apply topically twice a day to affected skin lesions. 11/28/23   Justina Oman, MD  nicotine  (NICODERM CQ  - DOSED IN MG/24 HOURS) 21 mg/24hr patch 21 mg patch daily for 1 week then 14 mg patch daily for 3 weeks then 7 mg patch daily for 3 weeks and stop 08/25/23   Angiulli, Everlyn Hockey, PA-C  nortriptyline  (PAMELOR ) 25 MG capsule Take 2 capsules (50 mg total) by mouth at bedtime. 08/25/23   Angiulli, Everlyn Hockey, PA-C  Oxcarbazepine  (TRILEPTAL ) 300 MG tablet 300 milligrams daily and 600 mg nightly 08/25/23   Angiulli, Everlyn Hockey, PA-C  pantoprazole  (PROTONIX ) 40 MG tablet Take 1 tablet (40 mg total) by mouth daily. 11/28/23 01/27/24  Justina Oman, MD  potassium chloride  SA (KLOR-CON  M) 20 MEQ tablet Take 1 tablet (20 mEq total) by mouth daily. 11/28/23   Justina Oman, MD  thiamine  (VITAMIN B1) 100 MG tablet Take 1 tablet (100 mg total) by mouth daily. 08/25/23   Angiulli, Everlyn Hockey, PA-C      Allergies    Neurontin [gabapentin]    Review of Systems   Review of Systems  Physical Exam Updated Vital Signs BP 113/74   Pulse 90   Temp (!) 97.5 F (36.4 C) (Oral)   Resp 16   Ht 6\' 2"  (1.88 m)   Wt 87.1 kg   SpO2 96%   BMI 24.65 kg/m  Physical Exam Vitals and nursing note reviewed.  Constitutional:      General: He is not in acute distress.    Appearance: He is well-developed.  HENT:     Head: Normocephalic and atraumatic.  Eyes:     Conjunctiva/sclera: Conjunctivae  normal.  Cardiovascular:     Rate and Rhythm: Regular rhythm. Tachycardia present.     Pulses: Normal pulses.  Pulmonary:     Effort: Pulmonary effort is normal. No respiratory distress.     Breath sounds: No stridor.  Abdominal:     General: There is no distension.  Musculoskeletal:     Comments: No obvious deformities, the patient does flex and extend his left ankle, though he notes pain while doing so.  He wiggles his toes to command. Patient's right arm are grossly normal, the patient holds his fingers in mid anatomic position without fully being able to flex or extend.  Wrist similarly has minimal flexion, extension, though he can do so to command.  Skin:    General: Skin is warm and dry.  Neurological:     Mental Status: He is alert and oriented to person, place, and time.     Cranial Nerves: No cranial nerve deficit.     Motor: Abnormal muscle tone present.     ED Results / Procedures / Treatments   Labs (all labs ordered are listed, but only abnormal results are displayed) Labs Reviewed  COMPREHENSIVE METABOLIC PANEL WITH GFR - Abnormal; Notable for the following components:      Result Value   Sodium 130 (*)    Chloride 97 (*)    Glucose, Bld 139 (*)    Calcium  8.5 (*)    Albumin  2.1 (*)    Alkaline Phosphatase 136 (*)    All other components within normal limits  CBC WITH DIFFERENTIAL/PLATELET - Abnormal; Notable for the following components:   WBC 12.9 (*)    RBC 3.73 (*)    Hemoglobin 12.3 (*)    HCT 35.2 (*)    Platelets 544 (*)    Neutro Abs 7.9 (*)    Monocytes Absolute 1.9 (*)    All other components within normal limits  MAGNESIUM  - Abnormal; Notable for the following components:   Magnesium  1.4 (*)    All other components within normal limits  SEDIMENTATION RATE - Abnormal; Notable for the following components:   Sed Rate 69 (*)    All other components within normal limits  ETHANOL    EKG EKG Interpretation Date/Time:  Monday January 05 2024  12:49:43 EDT Ventricular Rate:  130 PR Interval:  138 QRS Duration:  90 QT Interval:  312 QTC Calculation: 459 R Axis:   209  Text Interpretation: Sinus tachycardia Right superior axis deviation Abnormal ECG Confirmed by Dorenda Gandy [phone]) on 01/05/2024 1:19:22 PM  Radiology DG Hand Complete Right Result Date: 01/05/2024 CLINICAL DATA:  Left foot and right hand pain for 3 days. No known injury. EXAM: RIGHT  HAND - COMPLETE 3+ VIEW COMPARISON:  None Available. FINDINGS: The mineralization and alignment are normal. There is no evidence of acute fracture or dislocation. Scattered mild interphalangeal and distal radioulnar degenerative changes. No erosive changes are identified. There is possible mild soft tissue swelling around the right 2nd and 3rd proximal interphalangeal joints. IMPRESSION: No acute osseous findings. Mild degenerative changes. Possible mild soft tissue swelling around the right 2nd and 3rd proximal interphalangeal joints. Electronically Signed   By: Elmon Hagedorn M.D.   On: 01/05/2024 14:49   DG Foot Complete Left Result Date: 01/05/2024 CLINICAL DATA:  Left foot and right hand pain for 3 days. No known injury. EXAM: LEFT FOOT - COMPLETE 3+ VIEW COMPARISON:  None available. Report from remote radiographs 02/16/2001 correlated. FINDINGS: Postsurgical changes consistent with previous 1st metatarsal osteotomy with 2 K-wires in place. A cortical screw is present within the 3rd metatarsal head. There is no evidence of acute fracture or dislocation. Moderately advanced degenerative changes at the 1st metatarsophalangeal joint associated with a hallux valgus deformity. The additional joint spaces in the forefoot are preserved. There are mild midfoot degenerative changes. No acute soft tissue abnormalities are identified. IMPRESSION: 1. No acute osseous findings. 2. Postsurgical changes as described with moderately advanced degenerative changes at the 1st metatarsophalangeal joint.  Electronically Signed   By: Elmon Hagedorn M.D.   On: 01/05/2024 14:46    Procedures Procedures    Medications Ordered in ED Medications  HYDROmorphone  (DILAUDID ) injection 1 mg (has no administration in time range)  ketorolac (TORADOL) 30 MG/ML injection 15 mg (15 mg Intravenous Given 01/05/24 1420)  HYDROmorphone  (DILAUDID ) injection 1 mg (1 mg Intravenous Given 01/05/24 1412)  magnesium  sulfate IVPB 2 g 50 mL (0 g Intravenous Stopped 01/05/24 1625)  sodium chloride  0.9 % bolus 1,000 mL (0 mLs Intravenous Stopped 01/05/24 1831)  predniSONE  (DELTASONE ) tablet 60 mg (60 mg Oral Given 01/05/24 1829)  HYDROmorphone  (DILAUDID ) injection 1 mg (1 mg Intravenous Given 01/05/24 1829)    ED Course/ Medical Decision Making/ A&P  This patient with a Hx of multiple medical issues including recent head bleed, chronic neurology condition, gout presents to the ED for concern of pain and weakness in his right upper extremity, left lower extremity, this involves an extensive number of treatment options, and is a complaint that carries with it a high risk of complications and morbidity.    The differential diagnosis includes gout, progression of his degenerative neurologic condition, less likely stroke given his otherwise reassuring neuroexam   Social Determinants of Health:  Alcohol  abuse  Additional history obtained:  Additional history and/or information obtained from mother at bedside and chart review, notable for recent admission for head bleed   After the initial evaluation, orders, including: X-ray labs were initiated.   Patient placed on Cardiac and Pulse-Oximetry Monitors. The patient was maintained on a cardiac monitor.  The cardiac monitored showed an rhythm of 120 sinus tach abnormal The patient was also maintained on pulse oximetry. The readings were typically 100% room air normal   On repeat evaluation of the patient improved  Lab Tests:  I personally interpreted labs.  The pertinent  results include: Unremarkable labs, mild hyponatremia, mild hypomagnesemia  Imaging Studies ordered:  I independently visualized and interpreted imaging which showed no notable new findings foot, hand I agree with the radiologist interpretation  Consultations Obtained:  I requested consultation with the neurology and discussed lab and imaging findings as well as pertinent plan -we discussed the patient's history  of IVIG, alcohol  use, recent fall, and today's presentation.  We discussed admission with expedition of his IVIG as opposed to next week, versus close outpatient follow-up.  Subsequently I discussed this with the patient, he has a preference for discharge with outpatient follow-up  Dispostion / Final MDM:  After consideration of the diagnostic results and the patient's response to treatment, patient has improved markedly, vital signs have normalized, heart rate 90s, blood pressure normal, he has had repletion of his magnesium .  Given his history of chronic demyelinating inflammatory condition suspicion for this contributing to his pain/discomfort.  After patient improved, we discussed admission versus outpatient follow-up patient discharged in stable condition.  Final Clinical Impression(s) / ED Diagnoses Final diagnoses:  Pain  Hypomagnesemia    Rx / DC Orders ED Discharge Orders          Ordered    predniSONE  (DELTASONE ) 20 MG tablet  Daily with breakfast        01/05/24 2016    HYDROmorphone  (DILAUDID ) 2 MG tablet  Every 6 hours PRN        01/05/24 2016              Dorenda Gandy, MD 01/05/24 2017

## 2024-01-05 NOTE — Discharge Instructions (Signed)
 In addition to your new pain medication tablets, please consider using topical lidocaine , available over-the-counter for additional pain relief of your neuropathic discomfort.  Follow-up with your physicians.  Return here for concerning changes in your condition.

## 2024-01-05 NOTE — ED Triage Notes (Signed)
 Pt BIB [facility] c/o right hand, left foot pain x a few days, denies injury, v/s 118/80, HR 102, 100% RA

## 2024-01-06 ENCOUNTER — Telehealth (HOSPITAL_COMMUNITY): Payer: Self-pay | Admitting: Emergency Medicine

## 2024-01-06 ENCOUNTER — Ambulatory Visit: Admitting: Gastroenterology

## 2024-01-06 ENCOUNTER — Encounter: Payer: Self-pay | Admitting: Internal Medicine

## 2024-01-06 MED ORDER — HYDROMORPHONE HCL 2 MG PO TABS: 1.0000 mg | ORAL_TABLET | Freq: Four times a day (QID) | ORAL | 0 refills | Status: DC | PRN

## 2024-01-06 NOTE — Telephone Encounter (Signed)
 Patient seen in the ED yesterday, on 6/2, for breakthrough pain despite his chronic narcotic medication use.  He was prescribed 10 tablets of Dilaudid .  This medication was not available at his pharmacy.  He called to request prescription sent to alternative pharmacy.  Previous prescription was discontinued.  New prescription was sent to different pharmacy.

## 2024-01-07 ENCOUNTER — Inpatient Hospital Stay: Admitting: Oncology

## 2024-01-09 ENCOUNTER — Inpatient Hospital Stay: Attending: Oncology | Admitting: Oncology

## 2024-01-09 VITALS — BP 125/100 | HR 99 | Temp 97.9°F | Resp 16 | Wt 196.6 lb

## 2024-01-09 DIAGNOSIS — D72829 Elevated white blood cell count, unspecified: Secondary | ICD-10-CM | POA: Diagnosis not present

## 2024-01-09 DIAGNOSIS — R768 Other specified abnormal immunological findings in serum: Secondary | ICD-10-CM

## 2024-01-09 DIAGNOSIS — D75839 Thrombocytosis, unspecified: Secondary | ICD-10-CM | POA: Insufficient documentation

## 2024-01-09 DIAGNOSIS — G6181 Chronic inflammatory demyelinating polyneuritis: Secondary | ICD-10-CM

## 2024-01-09 DIAGNOSIS — D649 Anemia, unspecified: Secondary | ICD-10-CM | POA: Diagnosis not present

## 2024-01-09 DIAGNOSIS — D509 Iron deficiency anemia, unspecified: Secondary | ICD-10-CM | POA: Insufficient documentation

## 2024-01-09 NOTE — Assessment & Plan Note (Signed)
 Likely secondary to chronic inflammation and iron deficiency  - Will do JAK2 mutation analysis to rule out ET

## 2024-01-09 NOTE — Assessment & Plan Note (Signed)
 Patient has a history of chronic inflammatory demyelinating neuropathy - Receiving frequent IVIG for treatment

## 2024-01-09 NOTE — Assessment & Plan Note (Addendum)
 Currently normocytic.  Likely multifactorial from chronic inflammation, mild iron deficiency.  - Start taking oral ferrous sulfate every other day.  Use MiraLAX  for constipation - Follow-up with GI for age-appropriate colon cancer screening  Return to clinic in 4 weeks with labs, if no improvement in iron level or anemia can consider IV iron at that time.

## 2024-01-09 NOTE — Assessment & Plan Note (Signed)
 Likely secondary to chronic inflammation. ESR elevated No B symptoms at this time  - Continue to monitor - No indication for flow cytometry at this time

## 2024-01-09 NOTE — Progress Notes (Signed)
 [facility] at [facility]  HEMATOLOGY FOLLOW-UP VISIT  Connor Bickers, MD  REASON FOR FOLLOW-UP: Anemia  ASSESSMENT & PLAN:  Patient is a 48 y.o. male following for anemia  Anemia Currently normocytic.  Likely multifactorial from chronic inflammation, mild iron deficiency.  - Start taking oral ferrous sulfate every other day.  Use MiraLAX  for constipation - Follow-up with GI for age-appropriate colon cancer screening  Return to clinic in 4 weeks with labs, if no improvement in iron level or anemia can consider IV iron at that time.  Thrombocytosis Likely secondary to chronic inflammation and iron deficiency  - Will do JAK2 mutation analysis to rule out ET  Leukocytosis Likely secondary to chronic inflammation. ESR elevated No B symptoms at this time  - Continue to monitor - No indication for flow cytometry at this time  Chronic inflammatory demyelinating neuropathy (HCC) Patient has a history of chronic inflammatory demyelinating neuropathy - Receiving frequent IVIG for treatment  Elevated serum immunoglobulin free light chains Likely secondary to IVIG infusion FLC ratio: Normal SPEP: No M spike IFE: Polyclonal increase  - Continue to monitor - No indication for further workup at this time   Orders Placed This Encounter  Procedures   JAK2 V617 reflex CALR/MPL/E12-15    Standing Status:   Future    Number of Occurrences:   1    Expected Date:   01/09/2024    Expiration Date:   01/08/2025   Ferritin    Standing Status:   Future    Expected Date:   02/06/2024    Expiration Date:   01/08/2025   Folate    Standing Status:   Future    Expected Date:   02/06/2024    Expiration Date:   01/08/2025   Vitamin B12    Standing Status:   Future    Expected Date:   02/06/2024    Expiration Date:   01/08/2025   CBC with Differential/Platelet    Standing Status:   Future    Expected Date:   02/06/2024    Expiration Date:   01/08/2025   Comprehensive metabolic  panel with GFR    Standing Status:   Future    Expected Date:   02/06/2024    Expiration Date:   01/08/2025   Iron and TIBC    Standing Status:   Future    Expected Date:   02/06/2024    Expiration Date:   01/08/2025    The total time spent in the appointment was 20 minutes encounter with patients including review of chart and various tests results, discussions about plan of care and coordination of care plan   All questions were answered. The patient knows to call the clinic with any problems, questions or concerns. No barriers to learning was detected.  Eduardo Grade, MD 6/6/20253:34 PM    INTERVAL HISTORY: Connor Copeland 48 y.o. male following for anemia.  Anemia has improved significantly from April of this year.  He has no complaints today.  His fatigue has improved.  He had a recent episode of increased pain where he had to go to the ER.  Pain has improved at this time and is only requiring occasional pain medication.    I have reviewed the past medical history, past surgical history, social history and family history with the patient   ALLERGIES:  is allergic to neurontin [gabapentin].  MEDICATIONS:  Current Outpatient Medications  Medication Sig Dispense Refill   diltiazem  (CARDIZEM  CD)  120 MG 24 hr capsule Take 1 capsule (120 mg total) by mouth daily. 30 capsule 0   DULoxetine  (CYMBALTA ) 60 MG capsule Take 2 capsules (120 mg total) by mouth daily. 60 capsule 0   febuxostat  (ULORIC ) 40 MG tablet Take 1 tablet (40 mg total) by mouth daily. 30 tablet 0   fluticasone (FLONASE) 50 MCG/ACT nasal spray Place 1 spray into both nostrils daily.     folic acid  (FOLVITE ) 1 MG tablet Take 1 tablet (1 mg total) by mouth daily. 30 tablet 0   HYDROmorphone  (DILAUDID ) 2 MG tablet Take 0.5 tablets (1 mg total) by mouth every 6 (six) hours as needed for up to 20 doses for severe pain (pain score 7-10). 10 tablet 0   Lactulose  20 GM/30ML SOLN Take 30 mLs (20 g total) by mouth daily. 450 mL 2    magnesium  oxide (MAG-OX) 400 (240 Mg) MG tablet Take 1 tablet (400 mg total) by mouth 2 (two) times daily. 60 tablet 0   magnesium  oxide (MAGOX 400) 400 (240 Mg) MG tablet Take 1 tablet (400 mg total) by mouth daily. 30 tablet 1   metFORMIN  (GLUCOPHAGE ) 500 MG tablet Take 500 mg by mouth 2 (two) times daily.     Multiple Vitamin (MULTIVITAMIN WITH MINERALS) TABS tablet Take 1 tablet by mouth daily.     neomycin -bacitracin -polymyxin (NEOSPORIN) OINT Apply topically twice a day to affected skin lesions. 14 g 0   nicotine  (NICODERM CQ  - DOSED IN MG/24 HOURS) 21 mg/24hr patch 21 mg patch daily for 1 week then 14 mg patch daily for 3 weeks then 7 mg patch daily for 3 weeks and stop 28 patch 1   nortriptyline  (PAMELOR ) 25 MG capsule Take 2 capsules (50 mg total) by mouth at bedtime. 60 capsule 0   Oxcarbazepine  (TRILEPTAL ) 300 MG tablet 300 milligrams daily and 600 mg nightly 90 tablet 0   pantoprazole  (PROTONIX ) 40 MG tablet Take 1 tablet (40 mg total) by mouth daily. 30 tablet 1   potassium chloride  SA (KLOR-CON  M) 20 MEQ tablet Take 1 tablet (20 mEq total) by mouth daily. 30 tablet 0   predniSONE  (DELTASONE ) 20 MG tablet Take 2 tablets (40 mg total) by mouth daily with breakfast. For the next four days 8 tablet 0   thiamine  (VITAMIN B1) 100 MG tablet Take 1 tablet (100 mg total) by mouth daily. 30 tablet 0   No current facility-administered medications for this visit.     REVIEW OF SYSTEMS:   Constitutional: Denies fevers, chills or night sweats Eyes: Denies blurriness of vision Ears, nose, mouth, throat, and face: Denies mucositis or sore throat Respiratory: Denies cough, dyspnea or wheezes Cardiovascular: Denies palpitation, chest discomfort or lower extremity swelling Gastrointestinal:  Denies nausea, heartburn or change in bowel habits Skin: Denies abnormal skin rashes Lymphatics: Denies new lymphadenopathy or easy bruising Neurological:Denies numbness, tingling or new  weaknesses Behavioral/Psych: Mood is stable, no new changes  All other systems were reviewed with the patient and are negative.  PHYSICAL EXAMINATION:   Vitals:   01/09/24 1349  BP: (!) 125/100  Pulse: 99  Resp: 16  Temp: 97.9 F (36.6 C)  SpO2: 98%    GENERAL:alert, no distress and comfortable SKIN: skin color, texture, turgor are normal, no rashes or significant lesions LYMPH:  no palpable lymphadenopathy in the cervical, axillary or inguinal LUNGS: clear to auscultation and percussion with normal breathing effort HEART: regular rate & rhythm and no murmurs and no lower extremity edema ABDOMEN:abdomen soft,  non-tender and normal bowel sounds Musculoskeletal:no cyanosis of digits and no clubbing  NEURO: alert & oriented x 3 with fluent speech  LABORATORY DATA:  I have reviewed the data as listed  Lab Results  Component Value Date   WBC 12.9 (H) 01/05/2024   NEUTROABS 7.9 (H) 01/05/2024   HGB 12.3 (L) 01/05/2024   HCT 35.2 (L) 01/05/2024   MCV 94.4 01/05/2024   PLT 544 (H) 01/05/2024       Chemistry      Component Value Date/Time   NA 130 (L) 01/05/2024 1405   K 3.6 01/05/2024 1405   CL 97 (L) 01/05/2024 1405   CO2 22 01/05/2024 1405   BUN 9 01/05/2024 1405   CREATININE 0.90 01/05/2024 1405      Component Value Date/Time   CALCIUM  8.5 (L) 01/05/2024 1405   ALKPHOS 136 (H) 01/05/2024 1405   AST 32 01/05/2024 1405   ALT 16 01/05/2024 1405   BILITOT 1.0 01/05/2024 1405      Latest Reference Range & Units 12/24/23 13:31 12/24/23 13:32  Iron 45 - 182 ug/dL 31 (L)   UIBC ug/dL 161   TIBC 096 - 045 ug/dL 409 (L)   Saturation Ratios 17.9 - 39.5 % 17 (L)   Ferritin 24 - 336 ng/mL 281   Folate >5.9 ng/mL  14.7  Vitamin B12 180 - 914 pg/mL 856   Total Protein ELP 6.0 - 8.5 g/dL  7.4 (C)  Albumin  SerPl Elph-Mcnc 2.9 - 4.4 g/dL  2.2 (L) (C)  Albumin /Glob SerPl 0.7 - 1.7   0.5 (L) (C)  Alpha2 Glob SerPl Elph-Mcnc 0.4 - 1.0 g/dL  0.6 (C)  Alpha 1 0.0 - 0.4 g/dL   0.4 (C)  Gamma Glob SerPl Elph-Mcnc 0.4 - 1.8 g/dL  3.3 (H) (C)  M Protein SerPl Elph-Mcnc Not Observed g/dL  Not Observed (C)  IFE 1   Comment ! (C)  Globulin, Total 2.2 - 3.9 g/dL  5.2 (H) (C)  B-Globulin SerPl Elph-Mcnc 0.7 - 1.3 g/dL  0.9 (C)  IgG (Immunoglobin G), Serum 603 - 1,613 mg/dL  8,119 (H)  IgM (Immunoglobulin M), Srm 20 - 172 mg/dL  147  IgA 90 - 829 mg/dL  562 (H)  (L): Data is abnormally low (H): Data is abnormally high !: Data is abnormal (C): Corrected  IFE 1 Comment Abnormal  VC Comment CM  Comment: Polyclonal increase detected in one or more immunoglobulins.     Latest Reference Range & Units 01/05/24 14:05  Sed Rate 0 - 15 mm/hr 69 (H)  (H): Data is abnormally high   Latest Reference Range & Units 12/24/23 13:31  Kappa free light chain 3.3 - 19.4 mg/L 52.1 (H)  Lambda free light chains 5.7 - 26.3 mg/L 43.2 (H)  Kappa, lambda light chain ratio 0.26 - 1.65  1.21  (H): Data is abnormally high   RADIOGRAPHIC STUDIES: I have personally reviewed the radiological images as listed and agreed with the findings in the report.  None to review

## 2024-01-09 NOTE — Assessment & Plan Note (Signed)
 Likely secondary to IVIG infusion FLC ratio: Normal SPEP: No M spike IFE: Polyclonal increase  - Continue to monitor - No indication for further workup at this time

## 2024-01-19 LAB — CALR +MPL + E12-E15  (REFLEX)

## 2024-01-19 LAB — JAK2 V617F RFX CALR/MPL/E12-15

## 2024-01-26 ENCOUNTER — Telehealth: Payer: Self-pay

## 2024-01-26 DIAGNOSIS — I77810 Thoracic aortic ectasia: Secondary | ICD-10-CM

## 2024-01-26 NOTE — Telephone Encounter (Signed)
-----   Message from [facility] Renelda Kilian H sent at 01/23/2023  2:58 PM EDT ----- Regarding: CTA Aorta/Mallipeddi Mallipeddi, Diannah SQUIBB, MD  Lenon Jinnie HERO, RN Normal pumping function of the heart and no valvular heart disease. Aortic root dilatation noted at 41 mm. Obtain CTA aorta in one year.

## 2024-01-26 NOTE — Telephone Encounter (Signed)
 Called patient unable to leave message. Sent message to scheduling

## 2024-02-02 ENCOUNTER — Encounter: Payer: Self-pay | Admitting: Internal Medicine

## 2024-02-03 ENCOUNTER — Ambulatory Visit: Payer: Self-pay | Admitting: *Deleted

## 2024-02-03 ENCOUNTER — Inpatient Hospital Stay: Attending: Oncology

## 2024-02-03 ENCOUNTER — Other Ambulatory Visit: Payer: Self-pay | Admitting: *Deleted

## 2024-02-03 ENCOUNTER — Encounter: Payer: Self-pay | Admitting: *Deleted

## 2024-02-03 DIAGNOSIS — D509 Iron deficiency anemia, unspecified: Secondary | ICD-10-CM | POA: Insufficient documentation

## 2024-02-03 DIAGNOSIS — D72829 Elevated white blood cell count, unspecified: Secondary | ICD-10-CM | POA: Diagnosis not present

## 2024-02-03 DIAGNOSIS — E876 Hypokalemia: Secondary | ICD-10-CM

## 2024-02-03 DIAGNOSIS — D75839 Thrombocytosis, unspecified: Secondary | ICD-10-CM | POA: Diagnosis not present

## 2024-02-03 DIAGNOSIS — F1011 Alcohol abuse, in remission: Secondary | ICD-10-CM | POA: Insufficient documentation

## 2024-02-03 DIAGNOSIS — D649 Anemia, unspecified: Secondary | ICD-10-CM

## 2024-02-03 LAB — CBC WITH DIFFERENTIAL/PLATELET
Abs Immature Granulocytes: 0.03 10*3/uL (ref 0.00–0.07)
Basophils Absolute: 0 10*3/uL (ref 0.0–0.1)
Basophils Relative: 0 %
Eosinophils Absolute: 0.1 10*3/uL (ref 0.0–0.5)
Eosinophils Relative: 2 %
HCT: 35.7 % — ABNORMAL LOW (ref 39.0–52.0)
Hemoglobin: 12.2 g/dL — ABNORMAL LOW (ref 13.0–17.0)
Immature Granulocytes: 0 %
Lymphocytes Relative: 38 %
Lymphs Abs: 2.5 10*3/uL (ref 0.7–4.0)
MCH: 31.2 pg (ref 26.0–34.0)
MCHC: 34.2 g/dL (ref 30.0–36.0)
MCV: 91.3 fL (ref 80.0–100.0)
Monocytes Absolute: 0.9 10*3/uL (ref 0.1–1.0)
Monocytes Relative: 14 %
Neutro Abs: 3.1 10*3/uL (ref 1.7–7.7)
Neutrophils Relative %: 46 %
Platelets: 410 10*3/uL — ABNORMAL HIGH (ref 150–400)
RBC: 3.91 MIL/uL — ABNORMAL LOW (ref 4.22–5.81)
RDW: 14.9 % (ref 11.5–15.5)
WBC: 6.7 10*3/uL (ref 4.0–10.5)
nRBC: 0 % (ref 0.0–0.2)

## 2024-02-03 LAB — COMPREHENSIVE METABOLIC PANEL WITH GFR
ALT: 13 U/L (ref 0–44)
AST: 27 U/L (ref 15–41)
Albumin: 2.4 g/dL — ABNORMAL LOW (ref 3.5–5.0)
Alkaline Phosphatase: 115 U/L (ref 38–126)
Anion gap: 11 (ref 5–15)
BUN: 5 mg/dL — ABNORMAL LOW (ref 6–20)
CO2: 29 mmol/L (ref 22–32)
Calcium: 8.1 mg/dL — ABNORMAL LOW (ref 8.9–10.3)
Chloride: 96 mmol/L — ABNORMAL LOW (ref 98–111)
Creatinine, Ser: 0.74 mg/dL (ref 0.61–1.24)
GFR, Estimated: >60 mL/min (ref >60)
Glucose, Bld: 101 mg/dL — ABNORMAL HIGH (ref 70–99)
Potassium: 2.9 mmol/L — ABNORMAL LOW (ref 3.5–5.1)
Sodium: 136 mmol/L (ref 135–145)
Total Bilirubin: 0.6 mg/dL (ref 0.0–1.2)
Total Protein: 6.8 g/dL (ref 6.5–8.1)

## 2024-02-03 LAB — FERRITIN: Ferritin: 171 ng/mL (ref 24–336)

## 2024-02-03 LAB — IRON AND TIBC
Iron: 29 ug/dL — ABNORMAL LOW (ref 45–182)
Saturation Ratios: 12 % — ABNORMAL LOW (ref 17.9–39.5)
TIBC: 252 ug/dL (ref 250–450)
UIBC: 223 ug/dL

## 2024-02-03 LAB — VITAMIN B12: Vitamin B-12: 510 pg/mL (ref 180–914)

## 2024-02-03 LAB — FOLATE: Folate: 19 ng/mL (ref >5.9)

## 2024-02-03 NOTE — Progress Notes (Signed)
 Per Dr. Katragadda, advised patient to take extra potassium today and tomorrow and we will recheck in 1 week.

## 2024-02-10 ENCOUNTER — Ambulatory Visit: Admitting: Oncology

## 2024-02-11 ENCOUNTER — Inpatient Hospital Stay (HOSPITAL_BASED_OUTPATIENT_CLINIC_OR_DEPARTMENT_OTHER): Admitting: Oncology

## 2024-02-11 ENCOUNTER — Inpatient Hospital Stay

## 2024-02-11 VITALS — BP 107/84 | HR 104 | Temp 97.8°F | Resp 19 | Ht 74.0 in | Wt 190.2 lb

## 2024-02-11 DIAGNOSIS — R768 Other specified abnormal immunological findings in serum: Secondary | ICD-10-CM

## 2024-02-11 DIAGNOSIS — D72829 Elevated white blood cell count, unspecified: Secondary | ICD-10-CM | POA: Diagnosis not present

## 2024-02-11 DIAGNOSIS — G6181 Chronic inflammatory demyelinating polyneuritis: Secondary | ICD-10-CM | POA: Diagnosis not present

## 2024-02-11 DIAGNOSIS — D649 Anemia, unspecified: Secondary | ICD-10-CM | POA: Diagnosis not present

## 2024-02-11 DIAGNOSIS — D75839 Thrombocytosis, unspecified: Secondary | ICD-10-CM | POA: Diagnosis not present

## 2024-02-11 DIAGNOSIS — F1091 Alcohol use, unspecified, in remission: Secondary | ICD-10-CM

## 2024-02-11 DIAGNOSIS — E876 Hypokalemia: Secondary | ICD-10-CM

## 2024-02-11 DIAGNOSIS — D509 Iron deficiency anemia, unspecified: Secondary | ICD-10-CM | POA: Diagnosis not present

## 2024-02-11 LAB — POTASSIUM: Potassium: 3.7 mmol/L (ref 3.5–5.1)

## 2024-02-11 NOTE — Assessment & Plan Note (Signed)
 Likely secondary to IVIG infusion FLC ratio: Normal SPEP: No M spike IFE: Polyclonal increase  - Continue to monitor - No indication for further workup at this time

## 2024-02-11 NOTE — Assessment & Plan Note (Signed)
 Likely secondary to chronic inflammation and iron deficiency Improved currently JAK2/MPL/CALR/E12-E15: Negative  -Continue to monitor

## 2024-02-11 NOTE — Assessment & Plan Note (Signed)
 Patient was a previous heavy alcohol  user. Was admitted to the for hyponatremia, beer potomania Has quit alcohol  since then  - Continue to abstain from alcohol 

## 2024-02-11 NOTE — Assessment & Plan Note (Signed)
 Likely secondary to chronic inflammation. ESR elevated No B symptoms at this time  Resolved at this time

## 2024-02-11 NOTE — Assessment & Plan Note (Signed)
 Patient has a history of chronic inflammatory demyelinating neuropathy - Receiving frequent IVIG for treatment

## 2024-02-11 NOTE — Assessment & Plan Note (Signed)
 Currently normocytic.  Likely multifactorial from chronic inflammation, mild iron deficiency. Iron deficiency did not improve with oral iron supplementation  - Will administer IV iron for faster hematopoiesis and symptomatic improvement.  Discussed adverse effects including allergic reactions, nausea and headaches. - Continue oral iron every other day next time - Will send a GI referral for endoscopy and colonoscopy  Return to clinic in 2 months with labs to assess for response to IV iron.

## 2024-02-11 NOTE — Progress Notes (Signed)
 [facility] at [facility]  HEMATOLOGY FOLLOW-UP VISIT  Connor Norleen PEDLAR, MD  REASON FOR FOLLOW-UP: Anemia  ASSESSMENT & PLAN:  Patient is a 48 y.o. male following for anemia  Anemia Currently normocytic.  Likely multifactorial from chronic inflammation, mild iron deficiency. Iron deficiency did not improve with oral iron supplementation  - Will administer IV iron for faster hematopoiesis and symptomatic improvement.  Discussed adverse effects including allergic reactions, nausea and headaches. - Continue oral iron every other day next time - Will send a GI referral for endoscopy and colonoscopy  Return to clinic in 2 months with labs to assess for response to IV iron.  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 ESR elevated No B symptoms at this time  Resolved at this time  Thrombocytosis Likely secondary to chronic inflammation and iron deficiency Improved currently JAK2/MPL/CALR/E12-E15: Negative  -Continue to monitor  Elevated serum immunoglobulin free light chains Likely secondary to IVIG infusion FLC ratio: Normal SPEP: No M spike IFE: Polyclonal increase  - Continue to monitor - No indication for further workup at this time  Alcohol  use disorder in remission Patient was a previous heavy alcohol  user. Was admitted to the for hyponatremia, beer potomania Has quit alcohol  since then  - Continue to abstain from alcohol    No orders of the defined types were placed in this encounter.   The total time spent in the appointment was 20 minutes encounter with patients including review of chart and various tests results, discussions about plan of care and coordination of care plan   All questions were answered. The patient knows to call the clinic with any problems,  questions or concerns. No barriers to learning was detected.  Mickiel Dry, MD 7/9/20252:53 PM    INTERVAL HISTORY: Connor Copeland 48 y.o. male following for anemia.  Anemia stable today.  Since the last IVIG infusion, patient reports significant myalgias.  He has no other complaints today.  He denies fever, chills, abdominal pain, nausea, headache.  I have reviewed the past medical history, past surgical history, social history and family history with the patient   ALLERGIES:  is allergic to neurontin [gabapentin].  MEDICATIONS:  Current Outpatient Medications  Medication Sig Dispense Refill   diltiazem  (CARDIZEM  CD) 120 MG 24 hr capsule Take 1 capsule (120 mg total) by mouth daily. 30 capsule 0   DULoxetine  (CYMBALTA ) 60 MG capsule Take 2 capsules (120 mg total) by mouth daily. 60 capsule 0   febuxostat  (ULORIC ) 40 MG tablet Take 1 tablet (40 mg total) by mouth daily. 30 tablet 0   fluticasone (FLONASE) 50 MCG/ACT nasal spray Place 1 spray into both nostrils daily.     folic acid  (FOLVITE ) 1 MG tablet Take 1 tablet (1 mg total) by mouth daily. 30 tablet 0   HYDROcodone -acetaminophen  (NORCO) 10-325 MG tablet Take 1 tablet by mouth 4 (four) times daily as needed.     HYDROmorphone  (DILAUDID ) 2 MG tablet Take 0.5 tablets (1 mg total) by mouth every 6 (six) hours as needed for up to 20 doses for severe pain (pain score 7-10). 10 tablet 0   Lactulose  20 GM/30ML SOLN Take 30 mLs (20 g total) by mouth daily. 450 mL 2   magnesium  oxide (MAG-OX) 400 (240 Mg) MG tablet Take 1 tablet (400 mg total) by mouth 2 (two) times daily. 60 tablet 0  magnesium  oxide (MAGOX 400) 400 (240 Mg) MG tablet Take 1 tablet (400 mg total) by mouth daily. 30 tablet 1   metFORMIN  (GLUCOPHAGE ) 500 MG tablet Take 500 mg by mouth 2 (two) times daily.     Multiple Vitamin (MULTIVITAMIN WITH MINERALS) TABS tablet Take 1 tablet by mouth daily.     neomycin -bacitracin -polymyxin (NEOSPORIN) OINT Apply topically  twice a day to affected skin lesions. 14 g 0   nicotine  (NICODERM CQ  - DOSED IN MG/24 HOURS) 21 mg/24hr patch 21 mg patch daily for 1 week then 14 mg patch daily for 3 weeks then 7 mg patch daily for 3 weeks and stop 28 patch 1   nortriptyline  (PAMELOR ) 25 MG capsule Take 2 capsules (50 mg total) by mouth at bedtime. 60 capsule 0   Oxcarbazepine  (TRILEPTAL ) 300 MG tablet 300 milligrams daily and 600 mg nightly 90 tablet 0   pantoprazole  (PROTONIX ) 40 MG tablet Take 1 tablet (40 mg total) by mouth daily. 30 tablet 1   potassium chloride  SA (KLOR-CON  M) 20 MEQ tablet Take 1 tablet (20 mEq total) by mouth daily. 30 tablet 0   thiamine  (VITAMIN B1) 100 MG tablet Take 1 tablet (100 mg total) by mouth daily. 30 tablet 0   No current facility-administered medications for this visit.     REVIEW OF SYSTEMS:   Constitutional: Denies fevers, chills or night sweats Eyes: Denies blurriness of vision Ears, nose, mouth, throat, and face: Denies mucositis or sore throat Respiratory: Denies cough, dyspnea or wheezes Cardiovascular: Denies palpitation, chest discomfort or lower extremity swelling Gastrointestinal:  Denies nausea, heartburn or change in bowel habits Skin: Denies abnormal skin rashes Lymphatics: Denies new lymphadenopathy or easy bruising Neurological:Denies numbness, tingling or new weaknesses Behavioral/Psych: Mood is stable, no new changes  All other systems were reviewed with the patient and are negative.  PHYSICAL EXAMINATION:   Vitals:   02/11/24 1409  BP: 107/84  Pulse: (!) 104  Resp: 19  Temp: 97.8 F (36.6 C)  SpO2: 100%     GENERAL:alert, no distress and comfortable SKIN: skin color, texture, turgor are normal, no rashes or significant lesions LUNGS: clear to auscultation and percussion with normal breathing effort HEART: regular rate & rhythm and no murmurs and no lower extremity edema ABDOMEN:abdomen soft, non-tender and normal bowel sounds Musculoskeletal:no  cyanosis of digits and no clubbing  NEURO: alert & oriented x 3 with fluent speech  LABORATORY DATA:  I have reviewed the data as listed  Lab Results  Component Value Date   WBC 6.7 02/03/2024   NEUTROABS 3.1 02/03/2024   HGB 12.2 (L) 02/03/2024   HCT 35.7 (L) 02/03/2024   MCV 91.3 02/03/2024   PLT 410 (H) 02/03/2024       Chemistry      Component Value Date/Time   NA 136 02/03/2024 1101   K 3.7 02/11/2024 1330   CL 96 (L) 02/03/2024 1101   CO2 29 02/03/2024 1101   BUN 5 (L) 02/03/2024 1101   CREATININE 0.74 02/03/2024 1101      Component Value Date/Time   CALCIUM  8.1 (L) 02/03/2024 1101   ALKPHOS 115 02/03/2024 1101   AST 27 02/03/2024 1101   ALT 13 02/03/2024 1101   BILITOT 0.6 02/03/2024 1101      Latest Reference Range & Units 02/03/24 11:01  Iron 45 - 182 ug/dL 29 (L)  UIBC ug/dL 776  TIBC 749 - 549 ug/dL 747  Saturation Ratios 17.9 - 39.5 % 12 (L)  Ferritin 24 -  336 ng/mL 171  Folate >5.9 ng/mL 19.0  Vitamin B12 180 - 914 pg/mL 510  (L): Data is abnormally low   01/09/24 14:11  CALR +MPL + E12-E15 (reflexed) Negative  JAK2 V617F rfx CALR/MPL/E12-15 Negative    Latest Reference Range & Units 12/24/23 13:31 12/24/23 13:32  Total Protein ELP 6.0 - 8.5 g/dL  7.4 (C)  Albumin  SerPl Elph-Mcnc 2.9 - 4.4 g/dL  2.2 (L) (C)  Albumin /Glob SerPl 0.7 - 1.7   0.5 (L) (C)  Alpha2 Glob SerPl Elph-Mcnc 0.4 - 1.0 g/dL  0.6 (C)  Alpha 1 0.0 - 0.4 g/dL  0.4 (C)  Gamma Glob SerPl Elph-Mcnc 0.4 - 1.8 g/dL  3.3 (H) (C)  M Protein SerPl Elph-Mcnc Not Observed g/dL  Not Observed (C)  IFE 1   Comment ! (C)  Globulin, Total 2.2 - 3.9 g/dL  5.2 (H) (C)  B-Globulin SerPl Elph-Mcnc 0.7 - 1.3 g/dL  0.9 (C)  IgG (Immunoglobin G), Serum 603 - 1,613 mg/dL  6,438 (H)  IgM (Immunoglobulin M), Srm 20 - 172 mg/dL  884  IgA 90 - 613 mg/dL  535 (H)  (L): Data is abnormally low (H): Data is abnormally high !: Data is abnormal (C): Corrected  IFE 1 Comment Abnormal  VC Comment CM   Comment: Polyclonal increase detected in one or more immunoglobulins.     Latest Reference Range & Units 01/05/24 14:05  Sed Rate 0 - 15 mm/hr 69 (H)  (H): Data is abnormally high   Latest Reference Range & Units 12/24/23 13:31  Kappa free light chain 3.3 - 19.4 mg/L 52.1 (H)  Lambda free light chains 5.7 - 26.3 mg/L 43.2 (H)  Kappa, lambda light chain ratio 0.26 - 1.65  1.21  (H): Data is abnormally high   RADIOGRAPHIC STUDIES: I have personally reviewed the radiological images as listed and agreed with the findings in the report.  None to review

## 2024-02-12 ENCOUNTER — Encounter (INDEPENDENT_AMBULATORY_CARE_PROVIDER_SITE_OTHER): Payer: Self-pay | Admitting: *Deleted

## 2024-02-12 NOTE — Telephone Encounter (Signed)
 PERCERT:  CTA Aorta scheduled for 01/25/2025 at [facility]

## 2024-02-13 ENCOUNTER — Inpatient Hospital Stay

## 2024-02-13 VITALS — BP 119/84 | HR 81 | Temp 97.3°F | Resp 18

## 2024-02-13 DIAGNOSIS — D649 Anemia, unspecified: Secondary | ICD-10-CM

## 2024-02-13 DIAGNOSIS — D509 Iron deficiency anemia, unspecified: Secondary | ICD-10-CM | POA: Diagnosis not present

## 2024-02-13 DIAGNOSIS — I959 Hypotension, unspecified: Secondary | ICD-10-CM

## 2024-02-13 MED ORDER — SODIUM CHLORIDE 0.9 % IV SOLN: INTRAVENOUS | Status: DC

## 2024-02-13 MED ORDER — ACETAMINOPHEN 325 MG PO TABS
650.0000 mg | ORAL_TABLET | Freq: Once | ORAL | Status: AC
  Administered 2024-02-13: 650 mg via ORAL
  Filled 2024-02-13: qty 2

## 2024-02-13 MED ORDER — CETIRIZINE HCL 10 MG PO TABS
10.0000 mg | ORAL_TABLET | Freq: Once | ORAL | Status: AC
  Administered 2024-02-13: 10 mg via ORAL
  Filled 2024-02-13: qty 1

## 2024-02-13 MED ORDER — IRON SUCROSE 500 MG IVPB - SIMPLE MED
500.0000 mg | Freq: Once | INTRAVENOUS | Status: AC
  Administered 2024-02-13: 500 mg via INTRAVENOUS
  Filled 2024-02-13: qty 500

## 2024-02-13 NOTE — Progress Notes (Signed)
 Ok to proceed with Venofer  today with HR=103 and BP 103/79 per Dr. Davonna. Pt to receive 1L NS bolus.  Smiley Birr, PharmD, MBA

## 2024-02-13 NOTE — Patient Instructions (Signed)
 [facility] [place] - A DEPT OF [facility] [place]  Discharge Instructions: Thank you for choosing [facility] to provide your oncology and hematology care.  If you have a lab appointment with the Cancer Center - please note that after April 8th, 2024, all labs will be drawn in the cancer center.  You do not have to check in or register with the main entrance as you have in the past but will complete your check-in in the cancer center.  Wear comfortable clothing and clothing appropriate for easy access to any Portacath or PICC line.   We strive to give you quality time with your provider. You may need to reschedule your appointment if you arrive late (15 or more minutes).  Arriving late affects you and other patients whose appointments are after yours.  Also, if you miss three or more appointments without notifying the office, you may be dismissed from the clinic at the provider's discretion.      For prescription refill requests, have your pharmacy contact our office and allow 72 hours for refills to be completed.    Today you received the following 500 mg venofer , return as scheduled.   To help prevent nausea and vomiting after your treatment, we encourage you to take your nausea medication as directed.  BELOW ARE SYMPTOMS THAT SHOULD BE REPORTED IMMEDIATELY: *FEVER GREATER THAN 100.4 F (38 C) OR HIGHER *CHILLS OR SWEATING *NAUSEA AND VOMITING THAT IS NOT CONTROLLED WITH YOUR NAUSEA MEDICATION *UNUSUAL SHORTNESS OF BREATH *UNUSUAL BRUISING OR BLEEDING *URINARY PROBLEMS (pain or burning when urinating, or frequent urination) *BOWEL PROBLEMS (unusual diarrhea, constipation, pain near the anus) TENDERNESS IN MOUTH AND THROAT WITH OR WITHOUT PRESENCE OF ULCERS (sore throat, sores in mouth, or a toothache) UNUSUAL RASH, SWELLING OR PAIN  UNUSUAL VAGINAL DISCHARGE OR ITCHING   Items with * indicate a potential emergency and should be followed up as soon as  possible or go to the Emergency Department if any problems should occur.  Please show the CHEMOTHERAPY ALERT CARD or IMMUNOTHERAPY ALERT CARD at check-in to the Emergency Department and triage nurse.  Should you have questions after your visit or need to cancel or reschedule your appointment, please contact [facility] [place] - A DEPT OF [facility] [phone]  and follow the prompts.  Office hours are 8:00 a.m. to 4:30 p.m. Monday - Friday. Please note that voicemails left after 4:00 p.m. may not be returned until the following business day.  We are closed weekends and major holidays. You have access to a nurse at all times for urgent questions. Please call the main number to the clinic [phone] and follow the prompts.  For any non-urgent questions, you may also contact your provider using MyChart. We now offer e-Visits for anyone 8 and older to request care online for non-urgent symptoms. For details visit [email or web address].   Also download the MyChart app! Go to the app store, search MyChart, open the app, select [place], and log in with your MyChart username and password.

## 2024-02-13 NOTE — Progress Notes (Signed)
 Patient presents today for 500 mg Venofer , patients BP on admission 114/92 HR 125, patient's IV started and patient BP retaken, BP after IV was 90/78 HR 103. Patient denies dizziness at clinic but reports dizziness at home, however RN laid patient back in chair. Dr. Davonna made aware, okay for venofer  with additional bag of 1L of normal saline ordered. Patient tolerated iron  infusion with no complaints voiced. Peripheral IV site clean and dry with good blood return noted before and after infusion. Band aid applied. VSS with discharge and left in satisfactory condition with no s/s of distress noted.

## 2024-02-17 ENCOUNTER — Other Ambulatory Visit: Payer: Self-pay | Admitting: *Deleted

## 2024-02-17 ENCOUNTER — Telehealth: Payer: Self-pay | Admitting: *Deleted

## 2024-02-17 ENCOUNTER — Encounter (INDEPENDENT_AMBULATORY_CARE_PROVIDER_SITE_OTHER): Payer: Self-pay | Admitting: Gastroenterology

## 2024-02-17 ENCOUNTER — Ambulatory Visit (INDEPENDENT_AMBULATORY_CARE_PROVIDER_SITE_OTHER): Admitting: Gastroenterology

## 2024-02-17 ENCOUNTER — Encounter: Payer: Self-pay | Admitting: *Deleted

## 2024-02-17 VITALS — BP 81/55 | HR 132 | Temp 98.5°F | Ht 74.0 in | Wt 185.8 lb

## 2024-02-17 DIAGNOSIS — I714 Abdominal aortic aneurysm, without rupture, unspecified: Secondary | ICD-10-CM | POA: Insufficient documentation

## 2024-02-17 DIAGNOSIS — F1091 Alcohol use, unspecified, in remission: Secondary | ICD-10-CM

## 2024-02-17 DIAGNOSIS — D509 Iron deficiency anemia, unspecified: Secondary | ICD-10-CM | POA: Diagnosis not present

## 2024-02-17 DIAGNOSIS — D500 Iron deficiency anemia secondary to blood loss (chronic): Secondary | ICD-10-CM

## 2024-02-17 DIAGNOSIS — R748 Abnormal levels of other serum enzymes: Secondary | ICD-10-CM | POA: Insufficient documentation

## 2024-02-17 MED ORDER — PEG 3350-KCL-NA BICARB-NACL 420 G PO SOLR: 4000.0000 mL | Freq: Once | ORAL | 0 refills | Status: AC

## 2024-02-17 NOTE — Patient Instructions (Signed)
 It was very nice to meet you today, as dicussed with will plan for the following :  1) Ultrasound elastrography  2) Upper endoscopy and colonoscopy  3) See your primary care doctor for abdominal aorta aneurysm and need for ultrasound

## 2024-02-17 NOTE — Progress Notes (Signed)
 Myah Guynes Faizan Janneth Krasner , M.D. Gastroenterology & Hepatology [facility] Gastroenter[facility] [address] [place], [place] [address] Primary Care Physician: [name], MD [address] [name] [address]  Chief Complaint: Anemia, elevated liver enzymes   History of Present Illness: Connor Copeland is a 48 y.o. male Chronic inflammatory demyelinating neuropathy (CIPD) on IVIG with who presents for evaluation of Anemia and elevated liver enzymes   Patient has been following with hematology for anemia and is referred for evaluation of iron  deficiency.  Please plan for IV iron  fusion Patient was last seen in our clinic  for elevated liver enzymes on 7/24 in setting of alcohol  use disorder..  At that time patient had withdrawal seizure after he stopped drinking alcohol  abruptly  Currently patient reports he has been any alcohol  for past few months.  Denies any overt GI.  Denies any NSAID use but only takes hydrocodone  for arthritis The patient denies having any nausea, vomiting, fever, chills, hematochezia, melena, hematemesis, abdominal distention, abdominal pain, diarrhea, jaundice, pruritus or weight loss.  Last labs from 02/03/2024 AST 27 ALT 13 alk phos 115 T. bili 0.6 Ferritin 171 with iron  saturation borderline low (12) B12 folate 19 vitamin B12 512 Hemoglobin 12.2 MCV 91 platelet 410  Viral hep profile from 11/2023 hepatitis A nonimmune hepatitis B surface antigen core antibody negative hep C antibody negative HIV negative  Last ZHI:wnwz Last Colonoscopy:none  FHx: neg for any gastrointestinal/liver disease, no malignancies Social: Heavy alcohol  use and heavy severe smoking ( 5-6 glasses of hard liquor), quit both 2 months ago Surgical: no abdominal surgeries  Past Medical History: Past Medical History:  Diagnosis Date   CIDP (chronic inflammatory demyelinating polyneuropathy) (HCC)    ETOH abuse    Gout     Past Surgical History: Past  Surgical History:  Procedure Laterality Date   FOOT SURGERY      Family History: Family History  Problem Relation Age of Onset   COPD Father     Social History: Social History   Tobacco Use  Smoking Status Former   Current packs/day: 1.00   Types: Cigarettes  Smokeless Tobacco Never   Social History   Substance and Sexual Activity  Alcohol  Use Not Currently   Alcohol /week: 42.0 standard drinks of alcohol    Types: 42 Standard drinks or equivalent per week   Comment: 5-6 glasses of bourbon daily last drank 7 weeks ago   Social History   Substance and Sexual Activity  Drug Use No    Allergies: Allergies  Allergen Reactions   Neurontin [Gabapentin] Swelling    Leg swelling    Medications: Current Outpatient Medications  Medication Sig Dispense Refill   diltiazem  (CARDIZEM  CD) 120 MG 24 hr capsule Take 1 capsule (120 mg total) by mouth daily. 30 capsule 0   DULoxetine  (CYMBALTA ) 60 MG capsule Take 2 capsules (120 mg total) by mouth daily. 60 capsule 0   febuxostat  (ULORIC ) 40 MG tablet Take 1 tablet (40 mg total) by mouth daily. 30 tablet 0   fluticasone (FLONASE) 50 MCG/ACT nasal spray Place 1 spray into both nostrils daily.     folic acid  (FOLVITE ) 1 MG tablet Take 1 tablet (1 mg total) by mouth daily. 30 tablet 0   HYDROcodone -acetaminophen  (NORCO) 10-325 MG tablet Take 1 tablet by mouth 4 (four) times daily as needed.     HYDROmorphone  (DILAUDID ) 2 MG tablet Take 0.5 tablets (1 mg total) by mouth every 6 (six) hours as  needed for up to 20 doses for severe pain (pain score 7-10). 10 tablet 0   Lactulose  20 GM/30ML SOLN Take 30 mLs (20 g total) by mouth daily. 450 mL 2   magnesium  oxide (MAG-OX) 400 (240 Mg) MG tablet Take 1 tablet (400 mg total) by mouth 2 (two) times daily. 60 tablet 0   magnesium  oxide (MAGOX 400) 400 (240 Mg) MG tablet Take 1 tablet (400 mg total) by mouth daily. 30 tablet 1   metFORMIN  (GLUCOPHAGE ) 500 MG tablet Take 500 mg by mouth 2 (two)  times daily.     Multiple Vitamin (MULTIVITAMIN WITH MINERALS) TABS tablet Take 1 tablet by mouth daily.     neomycin -bacitracin -polymyxin (NEOSPORIN) OINT Apply topically twice a day to affected skin lesions. 14 g 0   nicotine  (NICODERM CQ  - DOSED IN MG/24 HOURS) 21 mg/24hr patch 21 mg patch daily for 1 week then 14 mg patch daily for 3 weeks then 7 mg patch daily for 3 weeks and stop 28 patch 1   nortriptyline  (PAMELOR ) 25 MG capsule Take 2 capsules (50 mg total) by mouth at bedtime. 60 capsule 0   Oxcarbazepine  (TRILEPTAL ) 300 MG tablet 300 milligrams daily and 600 mg nightly 90 tablet 0   pantoprazole  (PROTONIX ) 40 MG tablet Take 1 tablet (40 mg total) by mouth daily. 30 tablet 1   potassium chloride  SA (KLOR-CON  M) 20 MEQ tablet Take 1 tablet (20 mEq total) by mouth daily. 30 tablet 0   thiamine  (VITAMIN B1) 100 MG tablet Take 1 tablet (100 mg total) by mouth daily. 30 tablet 0   No current facility-administered medications for this visit.    Review of Systems: GENERAL: negative for malaise, night sweats HEENT: No changes in hearing or vision, no nose bleeds or other nasal problems. NECK: Negative for lumps, goiter, pain and significant neck swelling RESPIRATORY: Negative for cough, wheezing CARDIOVASCULAR: Negative for chest pain, leg swelling, palpitations, orthopnea GI: SEE HPI MUSCULOSKELETAL: Negative for joint pain or swelling, back pain, and muscle pain. SKIN: Negative for lesions, rash HEMATOLOGY Negative for prolonged bleeding, bruising easily, and swollen nodes. ENDOCRINE: Negative for cold or heat intolerance, polyuria, polydipsia and goiter. NEURO: negative for tremor, gait imbalance, syncope and seizures. The remainder of the review of systems is noncontributory.   Physical Exam: BP (!) 81/61 (BP Location: Left Arm, Patient Position: Sitting, Cuff Size: Normal)   Pulse (!) 132   Temp 98.5 F (36.9 C)   Ht 6' 2 (1.88 m)   Wt 185 lb 12.8 oz (84.3 kg)   BMI 23.86  kg/m  GENERAL: The patient is AO x3, in no acute distress. HEENT: Head is normocephalic and atraumatic. EOMI are intact. Mouth is well hydrated and without lesions. NECK: Supple. No masses LUNGS: Clear to auscultation. No presence of rhonchi/wheezing/rales. Adequate chest expansion HEART: RRR, normal s1 and s2. ABDOMEN: Soft, nontender, no guarding, no peritoneal signs, and nondistended. BS +. No masses.  Imaging/Labs: as above     Latest Ref Rng & Units 02/03/2024   11:01 AM 01/05/2024    2:05 PM 12/24/2023    1:31 PM  CBC  WBC 4.0 - 10.5 K/uL 6.7  12.9  7.7   Hemoglobin 13.0 - 17.0 g/dL 87.7  87.6  88.7   Hematocrit 39.0 - 52.0 % 35.7  35.2  34.8   Platelets 150 - 400 K/uL 410  544  430    Lab Results  Component Value Date   IRON  29 (L) 02/03/2024  TIBC 252 02/03/2024   FERRITIN 171 02/03/2024    I personally reviewed and interpreted the available labs, imaging and endoscopic files.  Ultrasound 09/2023  IMPRESSION: 1. Echogenic liver parenchyma consistent with hepatic steatosis and hepatocellular disease. 2. Otherwise negative examination    Impression and Plan:  Connor Copeland is a 48 y.o. male Chronic inflammatory demyelinating neuropathy (CIPD) on IVIG with who presents for evaluation of Anemia and elevated liver enzymes   # Normocytic anemia  Patients anemia is multifactorial with component of mild iron  deficiency and anemia of chronic disease  No overt GI bleed.  Patient is overdue for age-appropriate colon cancer screening as well  As per ACG guidelines , bidirectional endoscopy is recommended over iron  replacement therapy only. We will proceed along with Upper endoscopy with small bowel biopsies and Colonoscopy   #Elevated liver enzymes  Previously patient had elevated liver enzymes in setting of alcohol  use disorder with AST more than ALT have normalized since patient stopped drinking for past 2 months  I commended patient regarding alcohol   cessation  Hepatitis A nonimmune hepatitis B non exposed and negative, hepatitis C negative   Will obtain complete abdominal ultrasound elastography to evaluate degree of fibrosis if any present Check hepatitis B surface antibody status and if negative would recommend hep B and A vaccination   #AAA-abdominal aortic  aneurysm  Ultrasound from 12/2022 demonstrating  Abdominal aortic aneurysm measuring 3.9 cm.  Recommend follow-up every 2 years.  Patient was made aware of this and he will follow-up with primary care for continued surveillance of this Reportedly patient had stopped smoking which is imperative given abdominal aortic  aneurysm  Patient had elevated blood pressure, asymptomatic no fever chills and pleasant.  Appears no p.o. intake this morning, will give some p.o. fluids and snacks and reassess in the clinic.  If continues to be low may refer the patient to ER   ---------  About PO fluids and snacks patient BP remains low and he is tachycardiac . Although he is asymptomatic , I advised patient to proceed to the ED   All questions were answered.      Shaylah Mcghie Faizan Harlo Fabela, MD Gastroenterology and Hepatology [facility] Gastroenterology   This chart has been completed using [facility] Dictation software, and while attempts have been made to ensure accuracy , certain words and phrases may not be transcribed as intended

## 2024-02-17 NOTE — Telephone Encounter (Signed)
 [employer] PA: CPT Code GECOL Description: Colonoscopy Case Number: [id number] Review Date: 02/17/2024 2:47:12 PM Expiration Date: N/A Status: This member is not in scope for prior-authorization/notification for the services requested. You can save the case reference ID as validation of your request.  CPT Code GEEGD Description: Esophagogastroduodenoscopy Case Number: [id number] Review Date: 02/17/2024 2:49:17 PM Expiration Date: N/A Status: This member is not in scope for prior-authorization/notification for the services requested. You can save the case reference ID as validation of your request.

## 2024-02-20 ENCOUNTER — Inpatient Hospital Stay

## 2024-02-20 VITALS — BP 114/84 | HR 88 | Temp 96.7°F | Resp 18 | Wt 189.2 lb

## 2024-02-20 DIAGNOSIS — D649 Anemia, unspecified: Secondary | ICD-10-CM

## 2024-02-20 DIAGNOSIS — D509 Iron deficiency anemia, unspecified: Secondary | ICD-10-CM | POA: Diagnosis not present

## 2024-02-20 MED ORDER — IRON SUCROSE 500 MG IVPB - SIMPLE MED
500.0000 mg | Freq: Once | INTRAVENOUS | Status: AC
  Administered 2024-02-20: 500 mg via INTRAVENOUS
  Filled 2024-02-20: qty 500

## 2024-02-20 MED ORDER — ACETAMINOPHEN 325 MG PO TABS
650.0000 mg | ORAL_TABLET | Freq: Once | ORAL | Status: AC
  Administered 2024-02-20: 650 mg via ORAL
  Filled 2024-02-20: qty 2

## 2024-02-20 MED ORDER — CETIRIZINE HCL 10 MG PO TABS
10.0000 mg | ORAL_TABLET | Freq: Once | ORAL | Status: AC
  Administered 2024-02-20: 10 mg via ORAL
  Filled 2024-02-20: qty 1

## 2024-02-20 MED ORDER — SODIUM CHLORIDE 0.9 % IV SOLN: INTRAVENOUS | Status: DC

## 2024-02-20 NOTE — Progress Notes (Signed)
 Patient tolerated treatment well with no complaints voiced.  Patient left ambulatory in stable condition.  Vital signs stable at discharge.  Follow up as scheduled.

## 2024-02-20 NOTE — Patient Instructions (Signed)
 [facility] [place] - A DEPT OF [facility]  Discharge Instructions: Thank you for choosing [facility] to provide your oncology and hematology care.  If you have a lab appointment with the Cancer Center - please note that after April 8th, 2024, all labs will be drawn in the cancer center.  You do not have to check in or register with the main entrance as you have in the past but will complete your check-in in the cancer center.  Wear comfortable clothing and clothing appropriate for easy access to any Portacath or PICC line.   We strive to give you quality time with your provider. You may need to reschedule your appointment if you arrive late (15 or more minutes).  Arriving late affects you and other patients whose appointments are after yours.  Also, if you miss three or more appointments without notifying the office, you may be dismissed from the clinic at the provider's discretion.      For prescription refill requests, have your pharmacy contact our office and allow 72 hours for refills to be completed.    Today you received the following chemotherapy and/or immunotherapy agents Venofer. Iron Sucrose Injection What is this medication? IRON SUCROSE (EYE ern SOO krose) treats low levels of iron (iron deficiency anemia) in people with kidney disease. Iron is a mineral that plays an important role in making red blood cells, which carry oxygen from your lungs to the rest of your body. This medicine may be used for other purposes; ask your health care provider or pharmacist if you have questions. COMMON BRAND NAME(S): Venofer What should I tell my care team before I take this medication? They need to know if you have any of these conditions: Anemia not caused by low iron levels Heart disease High levels of iron in the blood Kidney disease Liver disease An unusual or allergic reaction to iron, other medications, foods, dyes, or preservatives Pregnant or  trying to get pregnant Breastfeeding How should I use this medication? This medication is infused into a vein. It is given by your care team in a hospital or clinic setting. Talk to your care team about the use of this medication in children. While it may be prescribed for children as young as 2 years for selected conditions, precautions do apply. Overdosage: If you think you have taken too much of this medicine contact a poison control center or emergency room at once. NOTE: This medicine is only for you. Do not share this medicine with others. What if I miss a dose? Keep appointments for follow-up doses. It is important not to miss your dose. Call your care team if you are unable to keep an appointment. What may interact with this medication? Do not take this medication with any of the following: Deferoxamine Dimercaprol Other iron products This medication may also interact with the following: Chloramphenicol Deferasirox This list may not describe all possible interactions. Give your health care provider a list of all the medicines, herbs, non-prescription drugs, or dietary supplements you use. Also tell them if you smoke, drink alcohol, or use illegal drugs. Some items may interact with your medicine. What should I watch for while using this medication? Visit your care team for regular checks on your progress. Tell your care team if your symptoms do not start to get better or if they get worse. You may need blood work done while you are taking this medication. You may need to eat  more foods that contain iron. Talk to your care team. Foods that contain iron include whole grains or cereals, dried fruits, beans, peas, leafy green vegetables, and organ meats (liver, kidney). What side effects may I notice from receiving this medication? Side effects that you should report to your care team as soon as possible: Allergic reactions--skin rash, itching, hives, swelling of the face, lips, tongue,  or throat Low blood pressure--dizziness, feeling faint or lightheaded, blurry vision Shortness of breath Side effects that usually do not require medical attention (report to your care team if they continue or are bothersome): Flushing Headache Joint pain Muscle pain Nausea Pain, redness, or irritation at injection site This list may not describe all possible side effects. Call your doctor for medical advice about side effects. You may report side effects to FDA at 1-800-FDA-1088. Where should I keep my medication? This medication is given in a hospital or clinic. It will not be stored at home. NOTE: This sheet is a summary. It may not cover all possible information. If you have questions about this medicine, talk to your doctor, pharmacist, or health care provider.  2024 Elsevier/Gold Standard (2023-03-12 00:00:00)      To help prevent nausea and vomiting after your treatment, we encourage you to take your nausea medication as directed.  BELOW ARE SYMPTOMS THAT SHOULD BE REPORTED IMMEDIATELY: *FEVER GREATER THAN 100.4 F (38 C) OR HIGHER *CHILLS OR SWEATING *NAUSEA AND VOMITING THAT IS NOT CONTROLLED WITH YOUR NAUSEA MEDICATION *UNUSUAL SHORTNESS OF BREATH *UNUSUAL BRUISING OR BLEEDING *URINARY PROBLEMS (pain or burning when urinating, or frequent urination) *BOWEL PROBLEMS (unusual diarrhea, constipation, pain near the anus) TENDERNESS IN MOUTH AND THROAT WITH OR WITHOUT PRESENCE OF ULCERS (sore throat, sores in mouth, or a toothache) UNUSUAL RASH, SWELLING OR PAIN  UNUSUAL VAGINAL DISCHARGE OR ITCHING   Items with * indicate a potential emergency and should be followed up as soon as possible or go to the Emergency Department if any problems should occur.  Please show the CHEMOTHERAPY ALERT CARD or IMMUNOTHERAPY ALERT CARD at check-in to the Emergency Department and triage nurse.  Should you have questions after your visit or need to cancel or reschedule your appointment, please  contact [facility] [place] - A DEPT OF [facility] [phone]  and follow the prompts.  Office hours are 8:00 a.m. to 4:30 p.m. Monday - Friday. Please note that voicemails left after 4:00 p.m. may not be returned until the following business day.  We are closed weekends and major holidays. You have access to a nurse at all times for urgent questions. Please call the main number to the clinic [phone] and follow the prompts.  For any non-urgent questions, you may also contact your provider using MyChart. We now offer e-Visits for anyone 70 and older to request care online for non-urgent symptoms. For details visit [email or web address].   Also download the MyChart app! Go to the app store, search "MyChart", open the app, select [place], and log in with your MyChart username and password.

## 2024-02-20 NOTE — Progress Notes (Signed)
 Patient presents today for IV iron  infusion (Venofer  500 mg ) . Vital signs are stable. Patient denies any changes since the last iron  infusion. Patient denies any side effects related to last iron . MAR reviewed and updated.

## 2024-02-26 ENCOUNTER — Ambulatory Visit (HOSPITAL_COMMUNITY): Admission: RE | Admit: 2024-02-26 | Source: Ambulatory Visit

## 2024-03-04 ENCOUNTER — Telehealth: Payer: Self-pay | Admitting: Neurology

## 2024-03-04 NOTE — Telephone Encounter (Addendum)
 Also received fax from [facility] that pt received last infusion 02/23/24 and 02/24/24. Received: Gammaked 10% 100g IV over 2 days 50G/day. Receives this infusion q 3 wk.   Spoke w/ Dr. Vear who agreed to see pt. Feels it could possibly be r/t to CIDP but would need to see him in person for evaluation/do Orthostatic vitals for further evaluation.  I called back at [phone]. Spoke w/ Jacob. He transferred me to [facility]. She asked that I call pt to schedule appt.  He has appt with PCP tomorrow for labs.   I called pt at [phone]. He has just a lab appt tomorrow at Dr. Milford office (PCP) and then office visit to f/u on this 03/11/24 at 11am. I offered to schedule him to see Dr. Vear 03/09/24 at 8:30am or 03/11/24 at 1:30pm. He declined.  He prefers to see Dr. Shona first for evaluation of this and will let us  know after how he wants to proceed.

## 2024-03-04 NOTE — Telephone Encounter (Signed)
 Janelle from [facility] [place] called to inform Md that Pt want blood presuure has been dropping to point where he pass out and fall and injure himself . Pt has appt with PCP coming soon, However pt wanted Janella call to  Neurologist office to see if Pt can be seen sooner due to his blood pressure keep dropping  low .

## 2024-03-22 ENCOUNTER — Encounter (HOSPITAL_COMMUNITY): Admission: RE | Payer: Self-pay | Source: Home / Self Care

## 2024-03-22 ENCOUNTER — Ambulatory Visit (HOSPITAL_COMMUNITY): Admission: RE | Admit: 2024-03-22 | Source: Home / Self Care | Admitting: Gastroenterology

## 2024-03-22 ENCOUNTER — Telehealth: Payer: Self-pay | Admitting: *Deleted

## 2024-03-22 SURGERY — COLONOSCOPY
Anesthesia: Choice

## 2024-03-22 NOTE — Telephone Encounter (Signed)
 Per Anitra in endo:  pt sick needs to reschedule

## 2024-03-22 NOTE — Telephone Encounter (Signed)
 Spoke with pt and he says he will call back some time this week to reschedule.  TCS/EGD w/Dr.Ahmed. any room

## 2024-03-25 NOTE — Telephone Encounter (Signed)
 Pt informed that provider doesn't have anything until Oct and we don't have his schedule at this time, we will call him to get him rescheduled once we get Octobers schedule.

## 2024-04-06 ENCOUNTER — Other Ambulatory Visit: Payer: Self-pay

## 2024-04-06 DIAGNOSIS — D649 Anemia, unspecified: Secondary | ICD-10-CM

## 2024-04-06 DIAGNOSIS — D75839 Thrombocytosis, unspecified: Secondary | ICD-10-CM

## 2024-04-06 DIAGNOSIS — R768 Other specified abnormal immunological findings in serum: Secondary | ICD-10-CM

## 2024-04-06 DIAGNOSIS — D72829 Elevated white blood cell count, unspecified: Secondary | ICD-10-CM

## 2024-04-07 ENCOUNTER — Inpatient Hospital Stay

## 2024-04-12 ENCOUNTER — Inpatient Hospital Stay (HOSPITAL_COMMUNITY)
Admission: EM | Admit: 2024-04-12 | Discharge: 2024-04-18 | DRG: 897 | Disposition: A | Discharge status: Home / Self Care | Attending: Internal Medicine | Admitting: Internal Medicine

## 2024-04-12 ENCOUNTER — Encounter (HOSPITAL_COMMUNITY): Payer: Self-pay

## 2024-04-12 ENCOUNTER — Other Ambulatory Visit: Payer: Self-pay

## 2024-04-12 ENCOUNTER — Emergency Department (HOSPITAL_COMMUNITY)

## 2024-04-12 DIAGNOSIS — E785 Hyperlipidemia, unspecified: Secondary | ICD-10-CM | POA: Diagnosis present

## 2024-04-12 DIAGNOSIS — F10231 Alcohol dependence with withdrawal delirium: Principal | ICD-10-CM | POA: Diagnosis present

## 2024-04-12 DIAGNOSIS — E440 Moderate protein-calorie malnutrition: Secondary | ICD-10-CM | POA: Diagnosis present

## 2024-04-12 DIAGNOSIS — R5381 Other malaise: Secondary | ICD-10-CM | POA: Insufficient documentation

## 2024-04-12 DIAGNOSIS — E872 Acidosis, unspecified: Secondary | ICD-10-CM | POA: Diagnosis present

## 2024-04-12 DIAGNOSIS — F10930 Alcohol use, unspecified with withdrawal, uncomplicated: Principal | ICD-10-CM

## 2024-04-12 DIAGNOSIS — G894 Chronic pain syndrome: Secondary | ICD-10-CM | POA: Diagnosis present

## 2024-04-12 DIAGNOSIS — Y900 Blood alcohol level of less than 20 mg/100 ml: Secondary | ICD-10-CM | POA: Diagnosis present

## 2024-04-12 DIAGNOSIS — E871 Hypo-osmolality and hyponatremia: Secondary | ICD-10-CM | POA: Diagnosis present

## 2024-04-12 DIAGNOSIS — R7401 Elevation of levels of liver transaminase levels: Secondary | ICD-10-CM | POA: Diagnosis not present

## 2024-04-12 DIAGNOSIS — D509 Iron deficiency anemia, unspecified: Secondary | ICD-10-CM | POA: Diagnosis present

## 2024-04-12 DIAGNOSIS — G629 Polyneuropathy, unspecified: Secondary | ICD-10-CM | POA: Diagnosis present

## 2024-04-12 DIAGNOSIS — K7010 Alcoholic hepatitis without ascites: Secondary | ICD-10-CM | POA: Diagnosis present

## 2024-04-12 DIAGNOSIS — G6181 Chronic inflammatory demyelinating polyneuritis: Secondary | ICD-10-CM | POA: Diagnosis present

## 2024-04-12 DIAGNOSIS — F10932 Alcohol use, unspecified with withdrawal with perceptual disturbance: Secondary | ICD-10-CM | POA: Diagnosis not present

## 2024-04-12 DIAGNOSIS — Z79899 Other long term (current) drug therapy: Secondary | ICD-10-CM | POA: Diagnosis not present

## 2024-04-12 DIAGNOSIS — E8809 Other disorders of plasma-protein metabolism, not elsewhere classified: Secondary | ICD-10-CM | POA: Diagnosis present

## 2024-04-12 DIAGNOSIS — M109 Gout, unspecified: Secondary | ICD-10-CM | POA: Diagnosis present

## 2024-04-12 DIAGNOSIS — Z7984 Long term (current) use of oral hypoglycemic drugs: Secondary | ICD-10-CM

## 2024-04-12 DIAGNOSIS — R569 Unspecified convulsions: Principal | ICD-10-CM

## 2024-04-12 DIAGNOSIS — Z87891 Personal history of nicotine dependence: Secondary | ICD-10-CM | POA: Diagnosis not present

## 2024-04-12 DIAGNOSIS — D72829 Elevated white blood cell count, unspecified: Secondary | ICD-10-CM | POA: Diagnosis present

## 2024-04-12 DIAGNOSIS — E876 Hypokalemia: Secondary | ICD-10-CM | POA: Diagnosis present

## 2024-04-12 DIAGNOSIS — E46 Unspecified protein-calorie malnutrition: Secondary | ICD-10-CM | POA: Diagnosis present

## 2024-04-12 DIAGNOSIS — Z825 Family history of asthma and other chronic lower respiratory diseases: Secondary | ICD-10-CM | POA: Diagnosis not present

## 2024-04-12 DIAGNOSIS — Z6824 Body mass index (BMI) 24.0-24.9, adult: Secondary | ICD-10-CM

## 2024-04-12 DIAGNOSIS — Z888 Allergy status to other drugs, medicaments and biological substances status: Secondary | ICD-10-CM

## 2024-04-12 LAB — COMPREHENSIVE METABOLIC PANEL WITH GFR
ALT: 22 U/L (ref 0–44)
AST: 55 U/L — ABNORMAL HIGH (ref 15–41)
Albumin: 2.3 g/dL — ABNORMAL LOW (ref 3.5–5.0)
Alkaline Phosphatase: 270 U/L — ABNORMAL HIGH (ref 38–126)
Anion gap: 13 (ref 5–15)
BUN: <5 mg/dL — ABNORMAL LOW (ref 6–20)
CO2: 28 mmol/L (ref 22–32)
Calcium: 7.7 mg/dL — ABNORMAL LOW (ref 8.9–10.3)
Chloride: 91 mmol/L — ABNORMAL LOW (ref 98–111)
Creatinine, Ser: 0.64 mg/dL (ref 0.61–1.24)
GFR, Estimated: >60 mL/min (ref >60)
Glucose, Bld: 88 mg/dL (ref 70–99)
Potassium: 2.9 mmol/L — ABNORMAL LOW (ref 3.5–5.1)
Sodium: 132 mmol/L — ABNORMAL LOW (ref 135–145)
Total Bilirubin: 1.3 mg/dL — ABNORMAL HIGH (ref 0.0–1.2)
Total Protein: 6.9 g/dL (ref 6.5–8.1)

## 2024-04-12 LAB — CBC WITH DIFFERENTIAL/PLATELET
Abs Immature Granulocytes: 0.04 K/uL (ref 0.00–0.07)
Basophils Absolute: 0 K/uL (ref 0.0–0.1)
Basophils Relative: 0 %
Eosinophils Absolute: 0 K/uL (ref 0.0–0.5)
Eosinophils Relative: 0 %
HCT: 41.8 % (ref 39.0–52.0)
Hemoglobin: 14.7 g/dL (ref 13.0–17.0)
Immature Granulocytes: 0 %
Lymphocytes Relative: 9 %
Lymphs Abs: 1.1 K/uL (ref 0.7–4.0)
MCH: 32.1 pg (ref 26.0–34.0)
MCHC: 35.2 g/dL (ref 30.0–36.0)
MCV: 91.3 fL (ref 80.0–100.0)
Monocytes Absolute: 0.7 K/uL (ref 0.1–1.0)
Monocytes Relative: 6 %
Neutro Abs: 11 K/uL — ABNORMAL HIGH (ref 1.7–7.7)
Neutrophils Relative %: 85 %
Platelets: 217 K/uL (ref 150–400)
RBC: 4.58 MIL/uL (ref 4.22–5.81)
RDW: 15.9 % — ABNORMAL HIGH (ref 11.5–15.5)
WBC: 12.8 K/uL — ABNORMAL HIGH (ref 4.0–10.5)
nRBC: 0 % (ref 0.0–0.2)

## 2024-04-12 LAB — LACTIC ACID, PLASMA
Lactic Acid, Venous: 3.4 mmol/L (ref 0.5–1.9)
Lactic Acid, Venous: 1.9 mmol/L (ref 0.5–1.9)

## 2024-04-12 LAB — MAGNESIUM: Magnesium: 1.3 mg/dL — ABNORMAL LOW (ref 1.7–2.4)

## 2024-04-12 LAB — LIPASE, BLOOD: Lipase: 50 U/L (ref 11–51)

## 2024-04-12 LAB — ETHANOL: Alcohol, Ethyl (B): <15 mg/dL (ref <15)

## 2024-04-12 MED ORDER — THIAMINE HCL 100 MG/ML IJ SOLN: 100.0000 mg | Freq: Every day | INTRAMUSCULAR | Status: DC

## 2024-04-12 MED ORDER — LORAZEPAM 2 MG/ML IJ SOLN: 2.0000 mg | Freq: Once | INTRAMUSCULAR | Status: DC

## 2024-04-12 MED ORDER — LORAZEPAM 2 MG/ML IJ SOLN
2.0000 mg | Freq: Once | INTRAMUSCULAR | Status: DC
  Filled 2024-04-12: qty 1

## 2024-04-12 MED ORDER — LORAZEPAM 2 MG/ML IJ SOLN
4.0000 mg | Freq: Once | INTRAMUSCULAR | Status: AC
  Administered 2024-04-12: 4 mg via INTRAVENOUS
  Filled 2024-04-12: qty 2

## 2024-04-12 MED ORDER — FOLIC ACID 1 MG PO TABS
1.0000 mg | ORAL_TABLET | Freq: Every day | ORAL | Status: DC
Start: 2024-04-13 — End: 2024-04-18
  Administered 2024-04-13 – 2024-04-18 (×6): 1 mg via ORAL
  Filled 2024-04-12 (×6): qty 1

## 2024-04-12 MED ORDER — ONDANSETRON HCL 4 MG/2ML IJ SOLN: 4.0000 mg | Freq: Four times a day (QID) | INTRAMUSCULAR | Status: DC | PRN

## 2024-04-12 MED ORDER — ADULT MULTIVITAMIN W/MINERALS CH
1.0000 | ORAL_TABLET | Freq: Every day | ORAL | Status: DC
  Administered 2024-04-13 – 2024-04-18 (×6): 1 via ORAL
  Filled 2024-04-12 (×6): qty 1

## 2024-04-12 MED ORDER — LORAZEPAM 2 MG/ML IJ SOLN
1.0000 mg | INTRAMUSCULAR | Status: AC | PRN
  Administered 2024-04-13: 2 mg via INTRAVENOUS
  Administered 2024-04-13 (×2): 4 mg via INTRAVENOUS
  Administered 2024-04-13: 2 mg via INTRAVENOUS
  Administered 2024-04-13: 4 mg via INTRAVENOUS
  Administered 2024-04-13: 2 mg via INTRAVENOUS
  Administered 2024-04-13: 4 mg via INTRAVENOUS
  Administered 2024-04-13 – 2024-04-15 (×4): 2 mg via INTRAVENOUS
  Filled 2024-04-12 (×2): qty 1
  Filled 2024-04-12: qty 2
  Filled 2024-04-12 (×2): qty 1
  Filled 2024-04-12 (×2): qty 2
  Filled 2024-04-12 (×2): qty 1
  Filled 2024-04-12: qty 2
  Filled 2024-04-12 (×2): qty 1

## 2024-04-12 MED ORDER — MAGNESIUM SULFATE 2 GM/50ML IV SOLN
2.0000 g | Freq: Once | INTRAVENOUS | Status: AC
Start: 2024-04-12 — End: 2024-04-13
  Administered 2024-04-12: 2 g via INTRAVENOUS
  Filled 2024-04-12: qty 50

## 2024-04-12 MED ORDER — ENOXAPARIN SODIUM 40 MG/0.4ML IJ SOSY
40.0000 mg | PREFILLED_SYRINGE | Freq: Every day | INTRAMUSCULAR | Status: DC
  Administered 2024-04-13 – 2024-04-17 (×6): 40 mg via SUBCUTANEOUS
  Filled 2024-04-12 (×6): qty 0.4

## 2024-04-12 MED ORDER — SODIUM CHLORIDE 0.9 % IV BOLUS
1000.0000 mL | Freq: Once | INTRAVENOUS | Status: AC
  Administered 2024-04-12: 1000 mL via INTRAVENOUS

## 2024-04-12 MED ORDER — LORAZEPAM 2 MG/ML IJ SOLN
2.0000 mg | Freq: Once | INTRAMUSCULAR | Status: AC
  Administered 2024-04-12: 2 mg via INTRAVENOUS
  Filled 2024-04-12: qty 1

## 2024-04-12 MED ORDER — THIAMINE MONONITRATE 100 MG PO TABS
100.0000 mg | ORAL_TABLET | Freq: Every day | ORAL | Status: DC
  Administered 2024-04-13 – 2024-04-18 (×6): 100 mg via ORAL
  Filled 2024-04-12 (×6): qty 1

## 2024-04-12 MED ORDER — ONDANSETRON HCL 4 MG PO TABS: 4.0000 mg | ORAL_TABLET | Freq: Four times a day (QID) | ORAL | Status: DC | PRN

## 2024-04-12 MED ORDER — MAGNESIUM OXIDE -MG SUPPLEMENT 400 (240 MG) MG PO TABS: 400.0000 mg | ORAL_TABLET | Freq: Once | ORAL | Status: DC

## 2024-04-12 MED ORDER — CHLORHEXIDINE GLUCONATE CLOTH 2 % EX PADS
6.0000 | MEDICATED_PAD | Freq: Every day | CUTANEOUS | Status: DC
  Administered 2024-04-13 – 2024-04-16 (×4): 6 via TOPICAL

## 2024-04-12 MED ORDER — LORAZEPAM 2 MG/ML IJ SOLN: 2.0000 mg | Freq: Once | INTRAMUSCULAR | Status: AC

## 2024-04-12 MED ORDER — ENSURE PLUS HIGH PROTEIN PO LIQD
237.0000 mL | Freq: Two times a day (BID) | ORAL | Status: DC
  Administered 2024-04-13 – 2024-04-16 (×5): 237 mL via ORAL
  Filled 2024-04-12 (×2): qty 237

## 2024-04-12 MED ORDER — LORAZEPAM 2 MG/ML IJ SOLN
INTRAMUSCULAR | Status: AC
  Administered 2024-04-12: 2 mg via INTRAMUSCULAR
  Filled 2024-04-12: qty 1

## 2024-04-12 MED ORDER — POTASSIUM CHLORIDE 20 MEQ PO PACK
40.0000 meq | PACK | Freq: Once | ORAL | Status: DC
  Filled 2024-04-12: qty 2

## 2024-04-12 MED ORDER — LORAZEPAM 1 MG PO TABS: 1.0000 mg | ORAL_TABLET | ORAL | Status: AC | PRN

## 2024-04-12 MED ORDER — POTASSIUM CHLORIDE 10 MEQ/100ML IV SOLN
10.0000 meq | INTRAVENOUS | Status: AC
Start: 2024-04-12 — End: 2024-04-13
  Administered 2024-04-12: 10 meq via INTRAVENOUS
  Filled 2024-04-12: qty 100

## 2024-04-12 MED ORDER — MORPHINE SULFATE (PF) 4 MG/ML IV SOLN
4.0000 mg | Freq: Once | INTRAVENOUS | Status: AC
  Administered 2024-04-12: 4 mg via INTRAVENOUS
  Filled 2024-04-12: qty 1

## 2024-04-12 NOTE — H&P (Addendum)
 History and Physical    Patient: Connor Copeland FMW:[id number] DOB: 1975/12/04 DOA: 04/12/2024 DOS: the patient was seen and examined on 04/12/2024 PCP: [name], MD  Patient coming from: Home  Chief Complaint:  Chief Complaint  Patient presents with   Alcohol  Intoxication   HPI: Connor Copeland is a 48 y.o. male with medical history significant of chronic inflammatory demyelinating polyneuropathy, alcohol  withdrawal seizure, gout, peripheral neuropathy, alcohol  abuse who presents to the emergency department from home via EMS due to a fall sustained at home. Patient was confused and was unable to provide history, history was obtained from ED physician and and mother at bedside.  Per report, patient had been alcohol  free for about 2 months, he then started to drink heavily again since the last 2 weeks, mother thought that he quit cold [place] yesterday.  He had an unwitnessed fall about 2 days ago and sustained an injury to the left temple.  EMS was activated and patient was reported to be actively seizing on arrival to the ED, he was noted with bleeding from the tongue due to biting of tongue, is also presents with urinary incontinence.  ED Course:  In the emergency department, patient was tachycardic, BP was 121/94, other vital signs were within normal range.  Workup in the ED showed normal CBC except for WBC of 12.8.  BMP showed sodium 132, potassium 2.9, chloride 91, bicarb 28, blood glucose 88, BUN < 5, creatinine 0.64, calcium  7.7, albumin  2.3, AST 55, ALT 22, ALP 270, magnesium  1.3, lactic acid 3.4 > 1.9 CT head without contrast showed no acute intracranial abnormality CT cervical spine without contrast showed no acute fracture or traumatic malalignment of the cervical spine. She was treated with Ativan , morphine  was given, IV hydration was provided.  TRH was asked to admit patient.  Review of Systems: Review of systems as noted in the HPI. All other systems reviewed and  are negative.   Past Medical History:  Diagnosis Date   CIDP (chronic inflammatory demyelinating polyneuropathy) (HCC)    ETOH abuse    Gout    Past Surgical History:  Procedure Laterality Date   FOOT SURGERY      Social History:  reports that he has quit smoking. His smoking use included cigarettes. He has never used smokeless tobacco. He reports that he does not currently use alcohol  after a past usage of about 42.0 standard drinks of alcohol  per week. He reports that he does not use drugs.   Allergies  Allergen Reactions   Neurontin [Gabapentin] Swelling    Leg swelling    Family History  Problem Relation Age of Onset   COPD Father      Prior to Admission medications   Medication Sig Start Date End Date Taking? Authorizing Provider  diltiazem  (CARDIZEM  CD) 120 MG 24 hr capsule Take 1 capsule (120 mg total) by mouth daily. 08/25/23   Angiulli, Toribio PARAS, PA-C  DULoxetine  (CYMBALTA ) 60 MG capsule Take 2 capsules (120 mg total) by mouth daily. 08/25/23   Angiulli, Toribio PARAS, PA-C  febuxostat  (ULORIC ) 40 MG tablet Take 1 tablet (40 mg total) by mouth daily. 08/25/23   Angiulli, Toribio PARAS, PA-C  fluticasone (FLONASE) 50 MCG/ACT nasal spray Place 1 spray into both nostrils daily. 07/15/23   [provider]  folic acid  (FOLVITE ) 1 MG tablet Take 1 tablet (1 mg total) by mouth daily. 08/25/23   Angiulli, Toribio PARAS, PA-C  HYDROcodone -acetaminophen  (NORCO) 10-325 MG tablet Take 1 tablet by  mouth 4 (four) times daily as needed. 01/22/24   [provider]  HYDROmorphone  (DILAUDID ) 2 MG tablet Take 0.5 tablets (1 mg total) by mouth every 6 (six) hours as needed for up to 20 doses for severe pain (pain score 7-10). 01/06/24   Melvenia Motto, MD  Lactulose  20 GM/30ML SOLN Take 30 mLs (20 g total) by mouth daily. 11/28/23   Ricky Fines, MD  magnesium  oxide (MAG-OX) 400 (240 Mg) MG tablet Take 1 tablet (400 mg total) by mouth 2 (two) times daily. 08/25/23   Angiulli, Toribio PARAS, PA-C   magnesium  oxide (MAGOX 400) 400 (240 Mg) MG tablet Take 1 tablet (400 mg total) by mouth daily. 11/28/23   Ricky Fines, MD  metFORMIN  (GLUCOPHAGE ) 500 MG tablet Take 500 mg by mouth 2 (two) times daily. 08/29/23   [provider]  Multiple Vitamin (MULTIVITAMIN WITH MINERALS) TABS tablet Take 1 tablet by mouth daily. 08/21/23   Angiulli, Toribio PARAS, PA-C  neomycin -bacitracin -polymyxin (NEOSPORIN) OINT Apply topically twice a day to affected skin lesions. 11/28/23   Ricky Fines, MD  nicotine  (NICODERM CQ  - DOSED IN MG/24 HOURS) 21 mg/24hr patch 21 mg patch daily for 1 week then 14 mg patch daily for 3 weeks then 7 mg patch daily for 3 weeks and stop 08/25/23   Angiulli, Toribio PARAS, PA-C  nortriptyline  (PAMELOR ) 25 MG capsule Take 2 capsules (50 mg total) by mouth at bedtime. 08/25/23   Angiulli, Toribio PARAS, PA-C  Oxcarbazepine  (TRILEPTAL ) 300 MG tablet 300 milligrams daily and 600 mg nightly 08/25/23   Angiulli, Toribio PARAS, PA-C  pantoprazole  (PROTONIX ) 40 MG tablet Take 1 tablet (40 mg total) by mouth daily. 11/28/23 02/20/24  Ricky Fines, MD  potassium chloride  SA (KLOR-CON  M) 20 MEQ tablet Take 1 tablet (20 mEq total) by mouth daily. 11/28/23   Ricky Fines, MD  thiamine  (VITAMIN B1) 100 MG tablet Take 1 tablet (100 mg total) by mouth daily. 08/25/23   Angiulli, Toribio PARAS, PA-C    Physical Exam: BP (!) 128/114   Pulse (!) 122   Temp 98.4 F (36.9 C) (Oral)   Resp 19   Ht 6' 2 (1.88 m)   Wt 85.8 kg   SpO2 97%   BMI 24.29 kg/m   General: 48 y.o. year-old male ill appearing, but in no acute distress.  Alert and oriented x2 (not to time). HEENT: NCAT, EOMI. Neck: Supple, trachea medial Cardiovascular: Tachycardia.  Regular rate and rhythm with no rubs or gallops.  No thyromegaly or JVD noted.  No lower extremity edema. 2/4 pulses in all 4 extremities. Respiratory: Clear to auscultation with no wheezes or rales. Good inspiratory effort. Abdomen: Soft, nontender nondistended with normal  bowel sounds x4 quadrants. Muskuloskeletal: No cyanosis, clubbing or edema noted bilaterally Neuro: No focal neurologic deficit.  Patient was moving all extremities. Skin: No ulcerative lesions noted or rashes Psychiatry: Mood is appropriate for condition and setting          Labs on Admission:  Basic Metabolic Panel: Recent Labs  Lab 04/12/24 2009  NA 132*  K 2.9*  CL 91*  CO2 28  GLUCOSE 88  BUN <5*  CREATININE 0.64  CALCIUM  7.7*  MG 1.3*   Liver Function Tests: Recent Labs  Lab 04/12/24 2009  AST 55*  ALT 22  ALKPHOS 270*  BILITOT 1.3*  PROT 6.9  ALBUMIN  2.3*   Recent Labs  Lab 04/12/24 2009  LIPASE 50   No results for input(s): AMMONIA in the last  168 hours. CBC: Recent Labs  Lab 04/12/24 2009  WBC 12.8*  NEUTROABS 11.0*  HGB 14.7  HCT 41.8  MCV 91.3  PLT 217   Cardiac Enzymes: No results for input(s): CKTOTAL, CKMB, CKMBINDEX, TROPONINI in the last 168 hours.  BNP (last 3 results) No results for input(s): BNP in the last 8760 hours.  ProBNP (last 3 results) No results for input(s): PROBNP in the last 8760 hours.  CBG: No results for input(s): GLUCAP in the last 168 hours.  Radiological Exams on Admission: CT Head Wo Contrast Result Date: 04/12/2024 EXAM: CT HEAD AND CERVICAL SPINE 04/12/2024 07:24:29 PM TECHNIQUE: CT of the head and cervical spine was performed without the administration of intravenous contrast. Multiplanar reformatted images are provided for review. Automated exposure control, iterative reconstruction, and/or weight based adjustment of the mA/kV was utilized to reduce the radiation dose to as low as reasonably achievable. COMPARISON: CT head and cervical spine dated 11/25/2023. CLINICAL HISTORY: Head trauma, moderate-severe. Pt arrived via POV c/o ETOH withdrawal and reports recent seizure from going cold-turkey from alcohol . Pt also reports neuropathy and is currently unable to walk. Pt reports last drink of  alcohol  was yesterday. Pt also reports seizure-like activity, tremors, weakness, nausea, diarrhea. FINDINGS: CT HEAD BRAIN AND VENTRICLES: No acute intracranial hemorrhage. No mass effect or midline shift. No abnormal extra-axial fluid collection. No evidence of acute infarct. No hydrocephalus. ORBITS: No acute abnormality. SINUSES AND MASTOIDS: No acute abnormality. SOFT TISSUES AND SKULL: No acute skull fracture. No acute soft tissue abnormality. CT CERVICAL SPINE BONES AND ALIGNMENT: No acute fracture or traumatic malalignment. DEGENERATIVE CHANGES: Degenerative change in the thoracic spine at the atlantoaxial joint and C6-C7. Posterior disc osteophyte complex eccentric to the left at C6-C7 causes moderate effacement of the thecal sac. SOFT TISSUES: No prevertebral soft tissue swelling. IMPRESSION: 1. No acute intracranial abnormality. 2. No acute fracture or traumatic malalignment of the cervical spine. Electronically signed by: Norman Gatlin MD 04/12/2024 07:32 PM EDT RP Workstation: [id number]   CT Cervical Spine Wo Contrast Result Date: 04/12/2024 EXAM: CT HEAD AND CERVICAL SPINE 04/12/2024 07:24:29 PM TECHNIQUE: CT of the head and cervical spine was performed without the administration of intravenous contrast. Multiplanar reformatted images are provided for review. Automated exposure control, iterative reconstruction, and/or weight based adjustment of the mA/kV was utilized to reduce the radiation dose to as low as reasonably achievable. COMPARISON: CT head and cervical spine dated 11/25/2023. CLINICAL HISTORY: Head trauma, moderate-severe. Pt arrived via POV c/o ETOH withdrawal and reports recent seizure from going cold-turkey from alcohol . Pt also reports neuropathy and is currently unable to walk. Pt reports last drink of alcohol  was yesterday. Pt also reports seizure-like activity, tremors, weakness, nausea, diarrhea. FINDINGS: CT HEAD BRAIN AND VENTRICLES: No acute intracranial hemorrhage. No mass  effect or midline shift. No abnormal extra-axial fluid collection. No evidence of acute infarct. No hydrocephalus. ORBITS: No acute abnormality. SINUSES AND MASTOIDS: No acute abnormality. SOFT TISSUES AND SKULL: No acute skull fracture. No acute soft tissue abnormality. CT CERVICAL SPINE BONES AND ALIGNMENT: No acute fracture or traumatic malalignment. DEGENERATIVE CHANGES: Degenerative change in the thoracic spine at the atlantoaxial joint and C6-C7. Posterior disc osteophyte complex eccentric to the left at C6-C7 causes moderate effacement of the thecal sac. SOFT TISSUES: No prevertebral soft tissue swelling. IMPRESSION: 1. No acute intracranial abnormality. 2. No acute fracture or traumatic malalignment of the cervical spine. Electronically signed by: Norman Gatlin MD 04/12/2024 07:32 PM EDT RP Workstation: [id number]  EKG: I independently viewed the EKG done and my findings are as followed: Sinus tachycardia at a rate of 120 bpm  Assessment/Plan Present on Admission:  Hyponatremia  Hypokalemia  Hypomagnesemia  Transaminitis  Hypoalbuminemia due to protein-calorie malnutrition (HCC)  Lactic acidosis  Principal Problem:   Perceptual disturbances and seizures concurrent with and due to alcohol  withdrawal (HCC) Active Problems:   Hypoalbuminemia due to protein-calorie malnutrition (HCC)   Hyponatremia   Hypokalemia   Lactic acidosis   Hypomagnesemia   Transaminitis   Physical deconditioning  Perceptual disturbances and seizures concurrent with and due to alcohol  withdrawal Alcohol  level was < 15 Continue CIWA protocol, thiamine  and multivitamin Continue fall precaution  Patient will be counseled on alcohol  withdrawal cessation when more stable  Hyponatremia possibly due to beer potomania Na 132, IV hydration was provided  Hypokalemia K+ 2.9, this will be replenished  Hypomagnesemia Mg level is 1.3 This will be replenished Please continue to monitor Mg level and correct  accordingly  Transaminitis AST 55, ALT 22, ALP 270 This may be due to patient's history of alcoholism and shock liver Continue to monitor liver enzymes  Hypoalbuminemia possibly secondary to moderate protein calorie malnutrition Albumin  2.3, protein supplement to be provided  Lactic acidosis-resolved Lactic acid 3.4 > 1.9  Physical deconditioning Continue PT eval and treat  DVT prophylaxis: Lovenox   Code Status: Full code  Family Communication: Mom at bedside (all questions answered to satisfaction)  Consults: None  Severity of Illness: The appropriate patient status for this patient is INPATIENT. Inpatient status is judged to be reasonable and necessary in order to provide the required intensity of service to ensure the patient's safety. The patient's presenting symptoms, physical exam findings, and initial radiographic and laboratory data in the context of their chronic comorbidities is felt to place them at high risk for further clinical deterioration. Furthermore, it is not anticipated that the patient will be medically stable for discharge from the hospital within 2 midnights of admission.   * I certify that at the point of admission it is my clinical judgment that the patient will require inpatient hospital care spanning beyond 2 midnights from the point of admission due to high intensity of service, high risk for further deterioration and high frequency of surveillance required.*  Author: Daryana Whirley, DO 04/12/2024 11:56 PM  For on call review [email or web address].

## 2024-04-12 NOTE — ED Provider Notes (Signed)
 [place] EMERGENCY DEPARTMENT AT [facility] Provider Note   CSN: [id number] Arrival date & time: 04/12/24  1651     Patient presents with: Alcohol  Intoxication   Connor Copeland is a 48 y.o. male with past medical history significant for hyperlipidemia, tobacco abuse, alcohol  withdrawal seizures, alcohol  abuse, peripheral neuropathy, SVT, chronic inflammatory demyelinating polyneuropathy who presents concern for alcohol  withdrawal, seizures.  Fall 2 days ago with noted head injury to left temple.  Tremors, weakness, nausea, diarrhea.  Had been alcohol  free for around 2 months per mother, but then started drinking again heavily within the last 2 weeks.  Mother has difficulty quantifying amount of alcohol , but may be nearly 1/5 of liquor.  Reportedly tried to quit cold [place], on ED arrival is actively seizing, incontinence of urine, active biting of tongue    Alcohol  Intoxication       Prior to Admission medications   Medication Sig Start Date End Date Taking? Authorizing Provider  diltiazem  (CARDIZEM  CD) 120 MG 24 hr capsule Take 1 capsule (120 mg total) by mouth daily. 08/25/23   Angiulli, Toribio PARAS, PA-C  DULoxetine  (CYMBALTA ) 60 MG capsule Take 2 capsules (120 mg total) by mouth daily. 08/25/23   Angiulli, Toribio PARAS, PA-C  febuxostat  (ULORIC ) 40 MG tablet Take 1 tablet (40 mg total) by mouth daily. 08/25/23   Angiulli, Toribio PARAS, PA-C  fluticasone (FLONASE) 50 MCG/ACT nasal spray Place 1 spray into both nostrils daily. 07/15/23   [provider]  folic acid  (FOLVITE ) 1 MG tablet Take 1 tablet (1 mg total) by mouth daily. 08/25/23   Angiulli, Toribio PARAS, PA-C  HYDROcodone -acetaminophen  (NORCO) 10-325 MG tablet Take 1 tablet by mouth 4 (four) times daily as needed. 01/22/24   [provider]  HYDROmorphone  (DILAUDID ) 2 MG tablet Take 0.5 tablets (1 mg total) by mouth every 6 (six) hours as needed for up to 20 doses for severe pain (pain score 7-10). 01/06/24    Melvenia Motto, MD  Lactulose  20 GM/30ML SOLN Take 30 mLs (20 g total) by mouth daily. 11/28/23   Ricky Fines, MD  magnesium  oxide (MAG-OX) 400 (240 Mg) MG tablet Take 1 tablet (400 mg total) by mouth 2 (two) times daily. 08/25/23   Angiulli, Toribio PARAS, PA-C  magnesium  oxide (MAGOX 400) 400 (240 Mg) MG tablet Take 1 tablet (400 mg total) by mouth daily. 11/28/23   Ricky Fines, MD  metFORMIN  (GLUCOPHAGE ) 500 MG tablet Take 500 mg by mouth 2 (two) times daily. 08/29/23   [provider]  Multiple Vitamin (MULTIVITAMIN WITH MINERALS) TABS tablet Take 1 tablet by mouth daily. 08/21/23   Angiulli, Toribio PARAS, PA-C  neomycin -bacitracin -polymyxin (NEOSPORIN) OINT Apply topically twice a day to affected skin lesions. 11/28/23   Ricky Fines, MD  nicotine  (NICODERM CQ  - DOSED IN MG/24 HOURS) 21 mg/24hr patch 21 mg patch daily for 1 week then 14 mg patch daily for 3 weeks then 7 mg patch daily for 3 weeks and stop 08/25/23   Angiulli, Toribio PARAS, PA-C  nortriptyline  (PAMELOR ) 25 MG capsule Take 2 capsules (50 mg total) by mouth at bedtime. 08/25/23   Angiulli, Toribio PARAS, PA-C  Oxcarbazepine  (TRILEPTAL ) 300 MG tablet 300 milligrams daily and 600 mg nightly 08/25/23   Angiulli, Toribio PARAS, PA-C  pantoprazole  (PROTONIX ) 40 MG tablet Take 1 tablet (40 mg total) by mouth daily. 11/28/23 02/20/24  Ricky Fines, MD  potassium chloride  SA (KLOR-CON  M) 20 MEQ tablet Take 1 tablet (20 mEq total) by mouth  daily. 11/28/23   Ricky Fines, MD  thiamine  (VITAMIN B1) 100 MG tablet Take 1 tablet (100 mg total) by mouth daily. 08/25/23   Angiulli, Toribio PARAS, PA-C    Allergies: Neurontin [gabapentin]    Review of Systems  All other systems reviewed and are negative.   Updated Vital Signs BP (!) 128/114   Pulse (!) 122   Temp 98.4 F (36.9 C) (Oral)   Resp 19   Ht 6' 2 (1.88 m)   Wt 85.8 kg   SpO2 97%   BMI 24.29 kg/m   Physical Exam Vitals and nursing note reviewed.  Constitutional:      General: He is in acute  distress.     Appearance: He is ill-appearing.  HENT:     Head: Normocephalic and atraumatic.     Mouth/Throat:     Comments: Lateral injury to tongue with active bleeding on arrival while patient is actively seizing Eyes:     General:        Right eye: No discharge.        Left eye: No discharge.  Cardiovascular:     Rate and Rhythm: Regular rhythm. Tachycardia present.     Heart sounds: No murmur heard.    No friction rub. No gallop.  Pulmonary:     Effort: Pulmonary effort is normal.     Breath sounds: Normal breath sounds.  Abdominal:     General: Bowel sounds are normal.     Palpations: Abdomen is soft.  Skin:    General: Skin is warm and dry.     Capillary Refill: Capillary refill takes less than 2 seconds.  Neurological:     Mental Status: He is oriented to person, place, and time.  Psychiatric:        Mood and Affect: Mood normal.        Behavior: Behavior normal.     (all labs ordered are listed, but only abnormal results are displayed) Labs Reviewed  CBC WITH DIFFERENTIAL/PLATELET - Abnormal; Notable for the following components:      Result Value   WBC 12.8 (*)    RDW 15.9 (*)    Neutro Abs 11.0 (*)    All other components within normal limits  COMPREHENSIVE METABOLIC PANEL WITH GFR - Abnormal; Notable for the following components:   Sodium 132 (*)    Potassium 2.9 (*)    Chloride 91 (*)    BUN <5 (*)    Calcium  7.7 (*)    Albumin  2.3 (*)    AST 55 (*)    Alkaline Phosphatase 270 (*)    Total Bilirubin 1.3 (*)    All other components within normal limits  MAGNESIUM  - Abnormal; Notable for the following components:   Magnesium  1.3 (*)    All other components within normal limits  LACTIC ACID, PLASMA - Abnormal; Notable for the following components:   Lactic Acid, Venous 3.4 (*)    All other components within normal limits  ETHANOL  LIPASE, BLOOD  LACTIC ACID, PLASMA    EKG: None  Radiology: CT Head Wo Contrast Result Date: 04/12/2024 EXAM: CT  HEAD AND CERVICAL SPINE 04/12/2024 07:24:29 PM TECHNIQUE: CT of the head and cervical spine was performed without the administration of intravenous contrast. Multiplanar reformatted images are provided for review. Automated exposure control, iterative reconstruction, and/or weight based adjustment of the mA/kV was utilized to reduce the radiation dose to as low as reasonably achievable. COMPARISON: CT head and cervical spine dated 11/25/2023.  CLINICAL HISTORY: Head trauma, moderate-severe. Pt arrived via POV c/o ETOH withdrawal and reports recent seizure from going cold-turkey from alcohol . Pt also reports neuropathy and is currently unable to walk. Pt reports last drink of alcohol  was yesterday. Pt also reports seizure-like activity, tremors, weakness, nausea, diarrhea. FINDINGS: CT HEAD BRAIN AND VENTRICLES: No acute intracranial hemorrhage. No mass effect or midline shift. No abnormal extra-axial fluid collection. No evidence of acute infarct. No hydrocephalus. ORBITS: No acute abnormality. SINUSES AND MASTOIDS: No acute abnormality. SOFT TISSUES AND SKULL: No acute skull fracture. No acute soft tissue abnormality. CT CERVICAL SPINE BONES AND ALIGNMENT: No acute fracture or traumatic malalignment. DEGENERATIVE CHANGES: Degenerative change in the thoracic spine at the atlantoaxial joint and C6-C7. Posterior disc osteophyte complex eccentric to the left at C6-C7 causes moderate effacement of the thecal sac. SOFT TISSUES: No prevertebral soft tissue swelling. IMPRESSION: 1. No acute intracranial abnormality. 2. No acute fracture or traumatic malalignment of the cervical spine. Electronically signed by: Norman Gatlin MD 04/12/2024 07:32 PM EDT RP Workstation: [id number]   CT Cervical Spine Wo Contrast Result Date: 04/12/2024 EXAM: CT HEAD AND CERVICAL SPINE 04/12/2024 07:24:29 PM TECHNIQUE: CT of the head and cervical spine was performed without the administration of intravenous contrast. Multiplanar reformatted  images are provided for review. Automated exposure control, iterative reconstruction, and/or weight based adjustment of the mA/kV was utilized to reduce the radiation dose to as low as reasonably achievable. COMPARISON: CT head and cervical spine dated 11/25/2023. CLINICAL HISTORY: Head trauma, moderate-severe. Pt arrived via POV c/o ETOH withdrawal and reports recent seizure from going cold-turkey from alcohol . Pt also reports neuropathy and is currently unable to walk. Pt reports last drink of alcohol  was yesterday. Pt also reports seizure-like activity, tremors, weakness, nausea, diarrhea. FINDINGS: CT HEAD BRAIN AND VENTRICLES: No acute intracranial hemorrhage. No mass effect or midline shift. No abnormal extra-axial fluid collection. No evidence of acute infarct. No hydrocephalus. ORBITS: No acute abnormality. SINUSES AND MASTOIDS: No acute abnormality. SOFT TISSUES AND SKULL: No acute skull fracture. No acute soft tissue abnormality. CT CERVICAL SPINE BONES AND ALIGNMENT: No acute fracture or traumatic malalignment. DEGENERATIVE CHANGES: Degenerative change in the thoracic spine at the atlantoaxial joint and C6-C7. Posterior disc osteophyte complex eccentric to the left at C6-C7 causes moderate effacement of the thecal sac. SOFT TISSUES: No prevertebral soft tissue swelling. IMPRESSION: 1. No acute intracranial abnormality. 2. No acute fracture or traumatic malalignment of the cervical spine. Electronically signed by: Norman Gatlin MD 04/12/2024 07:32 PM EDT RP Workstation: [id number]     .Critical Care  Performed by: Rosan Sherlean DEL, PA-C Authorized by: Rosan Sherlean DEL, PA-C   Critical care provider statement:    Critical care time (minutes):  35   Critical care was necessary to treat or prevent imminent or life-threatening deterioration of the following conditions:  Metabolic crisis   Critical care was time spent personally by me on the following activities:  Development of  treatment plan with patient or surrogate, discussions with consultants, evaluation of patient's response to treatment, examination of patient, ordering and review of laboratory studies, ordering and review of radiographic studies, ordering and performing treatments and interventions, pulse oximetry, re-evaluation of patient's condition and review of old charts   Care discussed with: admitting provider      Medications Ordered in the ED  potassium chloride  (KLOR-CON ) packet 40 mEq (has no administration in time range)  magnesium  sulfate IVPB 2 g 50 mL (has no administration in time range)  potassium  chloride 10 mEq in 100 mL IVPB (has no administration in time range)  sodium chloride  0.9 % bolus 1,000 mL (0 mLs Intravenous Stopped 04/12/24 2028)  LORazepam  (ATIVAN ) injection 2 mg (2 mg Intramuscular Given 04/12/24 1812)  LORazepam  (ATIVAN ) injection 2 mg (2 mg Intravenous Given 04/12/24 1842)  morphine  (PF) 4 MG/ML injection 4 mg (4 mg Intravenous Given 04/12/24 2028)  sodium chloride  0.9 % bolus 1,000 mL (1,000 mLs Intravenous New Bag/Given 04/12/24 2048)  LORazepam  (ATIVAN ) injection 4 mg (4 mg Intravenous Given 04/12/24 2103)    Clinical Course as of 04/12/24 2155  Mon Apr 12, 2024  1808 Alcohol  withdrawal [LS]    Clinical Course User Index [LS] Rogelia Jerilynn RAMAN, MD                                 Medical Decision Making Amount and/or Complexity of Data Reviewed Labs: ordered. Radiology: ordered.  Risk Prescription drug management.   This patient is a 48 y.o. male  who presents to the ED for concern of active seizures, alcohol  withdrawal.   Differential diagnoses prior to evaluation: The emergent differential diagnosis includes, but is not limited to, acute alcohol  withdrawal, delirium tremens, withdrawal seizures, dehydration, vs other withdrawal or seizure disorder. This is not an exhaustive differential.   Past Medical History / Co-morbidities / Social History:  hyperlipidemia,  tobacco abuse, alcohol  withdrawal seizures, alcohol  abuse, peripheral neuropathy, SVT, chronic inflammatory demyelinating polyneuropathy   Additional history: Chart reviewed. Pertinent results include: Reviewed lab work, imaging from previous emergency department visits, hospitalizations  Physical Exam: Physical exam performed. The pertinent findings include: Actively seizing on arrival, persistently tachycardic through eval, hypertensive, blood pressure 128/114.  Afebrile.  Does show signs of severe withdrawal, but without DTs.  Lab Tests/Imaging studies: I personally interpreted labs/imaging and the pertinent results include: CBC with leukocytosis, blood cells 12.8.Mild hyponatremia, sodium 132, hypokalemic potassium 2.9, hypocalcemia, calcium  7.7.  Mildly elevated alkaline phosphatase, total bilirubin.  CT head, C-spine without any acute localizing process.  No unstable fracture, no intracranial bleed.  I agree with the radiologist interpretation.  Cardiac monitoring: EKG obtained and interpreted by myself and attending physician which shows: Sinus tachycardia   Consults: Spoke with the hospitalist, Dr. Verona who agrees to admission for severe alcohol  withdrawal without DTs, persistent tachycardia despite Ativan , fluid bolus  Medications: I ordered medication including Ativan , fluid bolus, for alcohol  withdrawal potassium for hypokalemia, morphine  for pain.  I have reviewed the patients home medicines and have made adjustments as needed.   Disposition: After consideration of the diagnostic results and the patients response to treatment, I feel that patient would benefit froma dmission .     Final diagnoses:  Alcohol  withdrawal seizure without complication [facility])    ED Discharge Orders     None          Rosan Sherlean VEAR DEVONNA 04/12/24 2155    Rogelia Jerilynn RAMAN, MD 04/13/24 [phone]

## 2024-04-12 NOTE — ED Notes (Signed)
 Notified radiology pt has had 2nd dose of ativan .

## 2024-04-12 NOTE — ED Triage Notes (Addendum)
 Pt arrived via POV c/o ETOH withdrawal and reports recent seizure from going cold-turkey from alcohol . Pt also reports neuropathy and is currently unable to walk. Pt reports last drink of alcohol  was yesterday. Pt also reports  seizure-like activity, tremors, weakness, nausea, diarrhea.

## 2024-04-13 DIAGNOSIS — R569 Unspecified convulsions: Secondary | ICD-10-CM

## 2024-04-13 DIAGNOSIS — F10932 Alcohol use, unspecified with withdrawal with perceptual disturbance: Secondary | ICD-10-CM | POA: Diagnosis not present

## 2024-04-13 LAB — PHOSPHORUS: Phosphorus: 2.7 mg/dL (ref 2.5–4.6)

## 2024-04-13 LAB — CBC
HCT: 35.8 % — ABNORMAL LOW (ref 39.0–52.0)
Hemoglobin: 12.6 g/dL — ABNORMAL LOW (ref 13.0–17.0)
MCH: 32.1 pg (ref 26.0–34.0)
MCHC: 35.2 g/dL (ref 30.0–36.0)
MCV: 91.1 fL (ref 80.0–100.0)
Platelets: 159 K/uL (ref 150–400)
RBC: 3.93 MIL/uL — ABNORMAL LOW (ref 4.22–5.81)
RDW: 16 % — ABNORMAL HIGH (ref 11.5–15.5)
WBC: 9.6 K/uL (ref 4.0–10.5)
nRBC: 0 % (ref 0.0–0.2)

## 2024-04-13 LAB — COMPREHENSIVE METABOLIC PANEL WITH GFR
ALT: 20 U/L (ref 0–44)
AST: 43 U/L — ABNORMAL HIGH (ref 15–41)
Albumin: 1.9 g/dL — ABNORMAL LOW (ref 3.5–5.0)
Alkaline Phosphatase: 226 U/L — ABNORMAL HIGH (ref 38–126)
Anion gap: 13 (ref 5–15)
BUN: <5 mg/dL — ABNORMAL LOW (ref 6–20)
CO2: 24 mmol/L (ref 22–32)
Calcium: 7.2 mg/dL — ABNORMAL LOW (ref 8.9–10.3)
Chloride: 97 mmol/L — ABNORMAL LOW (ref 98–111)
Creatinine, Ser: 0.58 mg/dL — ABNORMAL LOW (ref 0.61–1.24)
GFR, Estimated: >60 mL/min (ref >60)
Glucose, Bld: 85 mg/dL (ref 70–99)
Potassium: 2.7 mmol/L — CL (ref 3.5–5.1)
Sodium: 134 mmol/L — ABNORMAL LOW (ref 135–145)
Total Bilirubin: 1.3 mg/dL — ABNORMAL HIGH (ref 0.0–1.2)
Total Protein: 5.9 g/dL — ABNORMAL LOW (ref 6.5–8.1)

## 2024-04-13 LAB — MAGNESIUM: Magnesium: 1.6 mg/dL — ABNORMAL LOW (ref 1.7–2.4)

## 2024-04-13 LAB — HIV ANTIBODY (ROUTINE TESTING W REFLEX): HIV Screen 4th Generation wRfx: NONREACTIVE

## 2024-04-13 LAB — MRSA NEXT GEN BY PCR, NASAL: MRSA by PCR Next Gen: NOT DETECTED

## 2024-04-13 MED ORDER — LACTULOSE 10 GM/15ML PO SOLN
20.0000 g | Freq: Every day | ORAL | Status: DC
  Administered 2024-04-14 – 2024-04-18 (×3): 20 g via ORAL
  Filled 2024-04-13 (×5): qty 30

## 2024-04-13 MED ORDER — POTASSIUM CHLORIDE CRYS ER 20 MEQ PO TBCR
40.0000 meq | EXTENDED_RELEASE_TABLET | ORAL | Status: AC
Start: 2024-04-13 — End: 2024-04-13
  Administered 2024-04-13 (×2): 40 meq via ORAL
  Filled 2024-04-13 (×2): qty 2

## 2024-04-13 MED ORDER — DEXMEDETOMIDINE HCL IN NACL 400 MCG/100ML IV SOLN
0.4000 ug/kg/h | INTRAVENOUS | Status: DC
  Administered 2024-04-13: 0.4 ug/kg/h via INTRAVENOUS
  Administered 2024-04-14: 0.5 ug/kg/h via INTRAVENOUS
  Administered 2024-04-14: 1 ug/kg/h via INTRAVENOUS
  Administered 2024-04-14: 0.8 ug/kg/h via INTRAVENOUS
  Administered 2024-04-15: 1.2 ug/kg/h via INTRAVENOUS
  Administered 2024-04-15: 0.8 ug/kg/h via INTRAVENOUS
  Administered 2024-04-15 (×2): 1.1 ug/kg/h via INTRAVENOUS
  Administered 2024-04-15: 1 ug/kg/h via INTRAVENOUS
  Administered 2024-04-16: 0.6 ug/kg/h via INTRAVENOUS
  Filled 2024-04-13 (×11): qty 100

## 2024-04-13 MED ORDER — POTASSIUM CHLORIDE CRYS ER 20 MEQ PO TBCR
40.0000 meq | EXTENDED_RELEASE_TABLET | Freq: Once | ORAL | Status: AC
  Administered 2024-04-13: 40 meq via ORAL
  Filled 2024-04-13: qty 2

## 2024-04-13 MED ORDER — ACETAMINOPHEN 325 MG PO TABS
650.0000 mg | ORAL_TABLET | Freq: Four times a day (QID) | ORAL | Status: DC | PRN
  Administered 2024-04-13 – 2024-04-16 (×7): 650 mg via ORAL
  Filled 2024-04-13 (×7): qty 2

## 2024-04-13 MED ORDER — FERROUS SULFATE 325 (65 FE) MG PO TABS
325.0000 mg | ORAL_TABLET | ORAL | Status: DC
  Administered 2024-04-13 – 2024-04-18 (×4): 325 mg via ORAL
  Filled 2024-04-13 (×5): qty 1

## 2024-04-13 MED ORDER — DULOXETINE HCL 60 MG PO CPEP
60.0000 mg | ORAL_CAPSULE | Freq: Every day | ORAL | Status: DC
  Administered 2024-04-13 – 2024-04-18 (×6): 60 mg via ORAL
  Filled 2024-04-13 (×6): qty 1

## 2024-04-13 MED ORDER — MAGNESIUM SULFATE 4 GM/100ML IV SOLN
4.0000 g | Freq: Once | INTRAVENOUS | Status: AC
  Administered 2024-04-13: 4 g via INTRAVENOUS
  Filled 2024-04-13: qty 100

## 2024-04-13 MED ORDER — DILTIAZEM HCL ER COATED BEADS 180 MG PO CP24
180.0000 mg | ORAL_CAPSULE | Freq: Every day | ORAL | Status: DC
  Administered 2024-04-13 – 2024-04-18 (×6): 180 mg via ORAL
  Filled 2024-04-13 (×6): qty 1

## 2024-04-13 MED ORDER — DIAZEPAM 5 MG PO TABS
5.0000 mg | ORAL_TABLET | Freq: Three times a day (TID) | ORAL | Status: AC
  Administered 2024-04-13 – 2024-04-14 (×6): 5 mg via ORAL
  Filled 2024-04-13 (×6): qty 1

## 2024-04-13 NOTE — Progress Notes (Signed)
 PROGRESS NOTE  Connor Copeland, is a 48 y.o. male, DOB - [birth year], FMW:[id number]  Admit date - 04/12/2024   Admitting Physician Connor Maier, DO  Outpatient Primary MD for the patient is Connor Norleen PEDLAR, MD  LOS - 1  Chief Complaint  Patient presents with   Alcohol  Intoxication      Brief Narrative:  48 y.o. male with medical history significant of chronic inflammatory demyelinating polyneuropathy, alcohol  withdrawal seizure, gout, peripheral neuropathy, alcohol  abuse admitted on 04/13/2023 with alcohol  withdrawal syndrome    -Assessment and Plan: 1)Alcohol  Withdrawal/Full Blown DTs----Quite symptomatic and requiring frequent benzos iv -Head and CT C-spine without acute findings ----c/n iv benzos per CIWA protocol - Continue folic acid  thiamine  multivitamin  2)Hypomagnesemia/Hypokalemia/Hyponatremia---- in an alcoholic male Replace and Recheck  3)Chronic iron  Deficiency Anemia--no bleeding concerns at this time - Anemia woke up from 02/03/2024 shows low serum iron , low iron  saturation, folate and B12 WNL -- Give iron  tablets  4)Alcoholic Hepatitis--- AST :ALT ratio is > 2:1 consistent with alcoholic hepatitis - LFTs are trending down  5)History of chronic inflammatory related polyneuropathy/peripheral neuropathy in the setting of ongoing alcohol  abuse--- follow-up with neurology for ongoing workup and management  6)Leukocytosis--- WBC has now normalized,  -Lactic acid has normalized - Suspect this is reactive due to fall and alcohol  withdrawal  Status is: Inpatient   Disposition: The patient is from: Home              Anticipated d/c is to: Home              Anticipated d/c date is: 2 days              Patient currently is not medically stable to d/c. Barriers: Not Clinically Stable-   Code Status :  -  Code Status: Full Code   Family Communication:    NA (patient is alert, awake and coherent) -Called Mother at [phone] --no answer,  Left voicemail  DVT  Prophylaxis  :   - SCDs   enoxaparin  (LOVENOX ) injection 40 mg Start: 04/12/24 2315 SCDs Start: 04/12/24 2312   Lab Results  Component Value Date   PLT 159 04/13/2024    Inpatient Medications  Scheduled Meds:  Chlorhexidine  Gluconate Cloth  6 each Topical Daily   diazepam   5 mg Oral TID   diltiazem   180 mg Oral Daily   DULoxetine   60 mg Oral Daily   enoxaparin  (LOVENOX ) injection  40 mg Subcutaneous QHS   feeding supplement  237 mL Oral BID BM   folic acid   1 mg Oral Daily   [START ON 04/14/2024] lactulose   20 g Oral Daily   multivitamin with minerals  1 tablet Oral Daily   potassium chloride   40 mEq Oral Once   potassium chloride   40 mEq Oral Q3H   thiamine   100 mg Oral Daily   Or   thiamine   100 mg Intravenous Daily   Continuous Infusions:  magnesium  sulfate bolus IVPB 4 g (04/13/24 0857)   PRN Meds:.LORazepam  **OR** LORazepam , ondansetron  **OR** ondansetron  (ZOFRAN ) IV   Anti-infectives (From admission, onward)    None         Subjective: Connor Copeland today has no fevers, no emesis,  No chest pain,   - -Remains confused and disoriented - Requiring frequent IV benzos  Called Mother at [phone] --no answer,  Left voicemail   Objective: Vitals:   04/13/24 0600 04/13/24 0608 04/13/24 0700 04/13/24 0747  BP: (!) 134/95 (!) 134/95 (!) 130/98  Pulse: (!) 124 (!) 125 (!) 125 (!) 126  Resp: (!) 22  19   Temp:   98.1 F (36.7 C)   TempSrc:   Oral   SpO2: 98%   98%  Weight:      Height:        Intake/Output Summary (Last 24 hours) at 04/13/2024 1034 Last data filed at 04/13/2024 0857 Gross per 24 hour  Intake 180 ml  Output 300 ml  Net -120 ml   Filed Weights   04/12/24 1714  Weight: 85.8 kg    Physical Exam Gen:- confused and disoriented,  HEENT:- .AT, No sclera icterus Neck-Supple Neck,No JVD,.  Lungs-  CTAB , fair symmetrical air movement CV- S1, S2 normal, regular , tachycardic Abd-  +ve B.Sounds, Abd Soft, No tenderness,     Extremity/Skin:- No  edema, pedal pulses present  Psych-restless, confused, agitated, disoriented, Neuro-no new focal deficits, +ve Tremors  Data Reviewed: I have personally reviewed following labs and imaging studies  CBC: Recent Labs  Lab 04/12/24 2009 04/13/24 0533  WBC 12.8* 9.6  NEUTROABS 11.0*  --   HGB 14.7 12.6*  HCT 41.8 35.8*  MCV 91.3 91.1  PLT 217 159   Basic Metabolic Panel: Recent Labs  Lab 04/12/24 2009 04/13/24 0533  NA 132* 134*  K 2.9* 2.7*  CL 91* 97*  CO2 28 24  GLUCOSE 88 85  BUN <5* <5*  CREATININE 0.64 0.58*  CALCIUM  7.7* 7.2*  MG 1.3* 1.6*  PHOS  --  2.7   GFR: Estimated Creatinine Clearance: 129.9 mL/min (A) (by C-G formula based on SCr of 0.58 mg/dL (L)). Liver Function Tests: Recent Labs  Lab 04/12/24 2009 04/13/24 0533  AST 55* 43*  ALT 22 20  ALKPHOS 270* 226*  BILITOT 1.3* 1.3*  PROT 6.9 5.9*  ALBUMIN  2.3* 1.9*   Recent Results (from the past 240 hours)  MRSA Next Gen by PCR, Nasal     Status: None   Collection Time: 04/13/24  1:10 AM   Specimen: Nasal Mucosa; Nasal Swab  Result Value Ref Range Status   MRSA by PCR Next Gen NOT DETECTED NOT DETECTED Final    Comment: (NOTE) The GeneXpert MRSA Assay (FDA approved for NASAL specimens only), is one component of a comprehensive MRSA colonization surveillance program. It is not intended to diagnose MRSA infection nor to guide or monitor treatment for MRSA infections. Test performance is not FDA approved in patients less than 27 years old. Performed at [facility], [address] [place], [place] [address]     Radiology Studies: CT Head Wo Contrast Result Date: 04/12/2024 EXAM: CT HEAD AND CERVICAL SPINE 04/12/2024 07:24:29 PM TECHNIQUE: CT of the head and cervical spine was performed without the administration of intravenous contrast. Multiplanar reformatted images are provided for review. Automated exposure control, iterative reconstruction, and/or weight based adjustment of  the mA/kV was utilized to reduce the radiation dose to as low as reasonably achievable. COMPARISON: CT head and cervical spine dated 11/25/2023. CLINICAL HISTORY: Head trauma, moderate-severe. Pt arrived via POV c/o ETOH withdrawal and reports recent seizure from going cold-turkey from alcohol . Pt also reports neuropathy and is currently unable to walk. Pt reports last drink of alcohol  was yesterday. Pt also reports seizure-like activity, tremors, weakness, nausea, diarrhea. FINDINGS: CT HEAD BRAIN AND VENTRICLES: No acute intracranial hemorrhage. No mass effect or midline shift. No abnormal extra-axial fluid collection. No evidence of acute infarct. No hydrocephalus. ORBITS: No acute abnormality. SINUSES AND MASTOIDS: No acute  abnormality. SOFT TISSUES AND SKULL: No acute skull fracture. No acute soft tissue abnormality. CT CERVICAL SPINE BONES AND ALIGNMENT: No acute fracture or traumatic malalignment. DEGENERATIVE CHANGES: Degenerative change in the thoracic spine at the atlantoaxial joint and C6-C7. Posterior disc osteophyte complex eccentric to the left at C6-C7 causes moderate effacement of the thecal sac. SOFT TISSUES: No prevertebral soft tissue swelling. IMPRESSION: 1. No acute intracranial abnormality. 2. No acute fracture or traumatic malalignment of the cervical spine. Electronically signed by: Norman Gatlin MD 04/12/2024 07:32 PM EDT RP Workstation: [id number]   CT Cervical Spine Wo Contrast Result Date: 04/12/2024 EXAM: CT HEAD AND CERVICAL SPINE 04/12/2024 07:24:29 PM TECHNIQUE: CT of the head and cervical spine was performed without the administration of intravenous contrast. Multiplanar reformatted images are provided for review. Automated exposure control, iterative reconstruction, and/or weight based adjustment of the mA/kV was utilized to reduce the radiation dose to as low as reasonably achievable. COMPARISON: CT head and cervical spine dated 11/25/2023. CLINICAL HISTORY: Head trauma,  moderate-severe. Pt arrived via POV c/o ETOH withdrawal and reports recent seizure from going cold-turkey from alcohol . Pt also reports neuropathy and is currently unable to walk. Pt reports last drink of alcohol  was yesterday. Pt also reports seizure-like activity, tremors, weakness, nausea, diarrhea. FINDINGS: CT HEAD BRAIN AND VENTRICLES: No acute intracranial hemorrhage. No mass effect or midline shift. No abnormal extra-axial fluid collection. No evidence of acute infarct. No hydrocephalus. ORBITS: No acute abnormality. SINUSES AND MASTOIDS: No acute abnormality. SOFT TISSUES AND SKULL: No acute skull fracture. No acute soft tissue abnormality. CT CERVICAL SPINE BONES AND ALIGNMENT: No acute fracture or traumatic malalignment. DEGENERATIVE CHANGES: Degenerative change in the thoracic spine at the atlantoaxial joint and C6-C7. Posterior disc osteophyte complex eccentric to the left at C6-C7 causes moderate effacement of the thecal sac. SOFT TISSUES: No prevertebral soft tissue swelling. IMPRESSION: 1. No acute intracranial abnormality. 2. No acute fracture or traumatic malalignment of the cervical spine. Electronically signed by: Norman Gatlin MD 04/12/2024 07:32 PM EDT RP Workstation: [id number]   Scheduled Meds:  Chlorhexidine  Gluconate Cloth  6 each Topical Daily   diazepam   5 mg Oral TID   diltiazem   180 mg Oral Daily   DULoxetine   60 mg Oral Daily   enoxaparin  (LOVENOX ) injection  40 mg Subcutaneous QHS   feeding supplement  237 mL Oral BID BM   folic acid   1 mg Oral Daily   [START ON 04/14/2024] lactulose   20 g Oral Daily   multivitamin with minerals  1 tablet Oral Daily   potassium chloride   40 mEq Oral Once   potassium chloride   40 mEq Oral Q3H   thiamine   100 mg Oral Daily   Or   thiamine   100 mg Intravenous Daily   Continuous Infusions:  magnesium  sulfate bolus IVPB 4 g (04/13/24 0857)    LOS: 1 day   Rendall Carwin M.D on 04/13/2024 at 10:34 AM  Go to [email or web address] - for  contact info  [facility] - Office  [phone]  If 7PM-7AM, please contact night-coverage [email or web address] 04/13/2024, 10:34 AM

## 2024-04-13 NOTE — Progress Notes (Signed)
 PT Cancellation Note  Patient Details Name: Connor Copeland MRN: [id number] DOB: October 12, 1975   Cancelled Treatment:    Reason Eval/Treat Not Completed: Fatigue/lethargy limiting ability to participate. Will check back tomorrow.   12:23 PM, 04/13/24 Lynwood Music, MPT Physical Therapist with [facility] [phone] office [phone] mobile phone

## 2024-04-13 NOTE — Plan of Care (Signed)

## 2024-04-14 ENCOUNTER — Inpatient Hospital Stay: Admitting: Oncology

## 2024-04-14 ENCOUNTER — Telehealth: Payer: Self-pay | Admitting: *Deleted

## 2024-04-14 DIAGNOSIS — E871 Hypo-osmolality and hyponatremia: Secondary | ICD-10-CM | POA: Diagnosis not present

## 2024-04-14 DIAGNOSIS — R7401 Elevation of levels of liver transaminase levels: Secondary | ICD-10-CM | POA: Diagnosis not present

## 2024-04-14 DIAGNOSIS — F10932 Alcohol use, unspecified with withdrawal with perceptual disturbance: Secondary | ICD-10-CM | POA: Diagnosis not present

## 2024-04-14 DIAGNOSIS — E872 Acidosis, unspecified: Secondary | ICD-10-CM | POA: Diagnosis not present

## 2024-04-14 LAB — RENAL FUNCTION PANEL
Albumin: 1.9 g/dL — ABNORMAL LOW (ref 3.5–5.0)
Anion gap: 6 (ref 5–15)
BUN: <5 mg/dL — ABNORMAL LOW (ref 6–20)
CO2: 26 mmol/L (ref 22–32)
Calcium: 7.5 mg/dL — ABNORMAL LOW (ref 8.9–10.3)
Chloride: 102 mmol/L (ref 98–111)
Creatinine, Ser: 0.58 mg/dL — ABNORMAL LOW (ref 0.61–1.24)
GFR, Estimated: >60 mL/min (ref >60)
Glucose, Bld: 105 mg/dL — ABNORMAL HIGH (ref 70–99)
Phosphorus: 3.2 mg/dL (ref 2.5–4.6)
Potassium: 3.8 mmol/L (ref 3.5–5.1)
Sodium: 134 mmol/L — ABNORMAL LOW (ref 135–145)

## 2024-04-14 LAB — MAGNESIUM: Magnesium: 1.9 mg/dL (ref 1.7–2.4)

## 2024-04-14 MED ORDER — SODIUM CHLORIDE 0.9 % IV SOLN: INTRAVENOUS | Status: AC

## 2024-04-14 NOTE — Progress Notes (Signed)
 PROGRESS NOTE  Connor Copeland, is a 48 y.o. male, DOB - 1976-03-30, FMW:[id number]  Admit date - 04/12/2024   Admitting Physician Posey Maier, DO  Outpatient Primary MD for the patient is [name], MD  LOS - 2  Chief Complaint  Patient presents with   Alcohol  Intoxication      Brief Narrative:  48 y.o. male with medical history significant of chronic inflammatory demyelinating polyneuropathy, alcohol  withdrawal seizure, gout, peripheral neuropathy, alcohol  abuse admitted on 04/13/2023 with alcohol  withdrawal syndrome    -Assessment and Plan: 1)Alcohol  Withdrawal/Full Blown DTs----Quite symptomatic and requiring frequent benzos iv -Head and CT C-spine without acute findings ----c/n iv Precedex  and the use of benzos per CIWA protocol - Continue folic acid  thiamine  multivitamin  2)Hypomagnesemia/Hypokalemia/Hyponatremia---- in an alcoholic male Replace and Recheck  3)Chronic iron  Deficiency Anemia--no bleeding concerns at this time - Anemia woke up from 02/03/2024 shows low serum iron , low iron  saturation, folate and B12 WNL -- Give iron  tablets  4)Alcoholic Hepatitis--- AST :ALT ratio is > 2:1 consistent with alcoholic hepatitis - LFTs are trending down  5)History of chronic inflammatory related polyneuropathy/peripheral neuropathy in the setting of ongoing alcohol  abuse--- follow-up with neurology for ongoing workup and management  6)Leukocytosis--- WBC has now normalized,  -Lactic acid has normalized - Suspect this is reactive due to fall and alcohol  withdrawal  Status is: Inpatient   Disposition: The patient is from: Home              Anticipated d/c is to: Home              Anticipated d/c date is: 2 days              Patient currently is not medically stable to d/c.  Barriers: Not Clinically Stable-   Code Status :  -  Code Status: Full Code   Family Communication:    NA (patient is alert, awake and coherent) -Called Mother at [phone] --no answer,   Left voicemail  DVT Prophylaxis  :   - SCDs   enoxaparin  (LOVENOX ) injection 40 mg Start: 04/12/24 2315 SCDs Start: 04/12/24 2312   Lab Results  Component Value Date   PLT 159 04/13/2024    Inpatient Medications  Scheduled Meds:  Chlorhexidine  Gluconate Cloth  6 each Topical Daily   diazepam   5 mg Oral TID   diltiazem   180 mg Oral Daily   DULoxetine   60 mg Oral Daily   enoxaparin  (LOVENOX ) injection  40 mg Subcutaneous QHS   feeding supplement  237 mL Oral BID BM   ferrous sulfate   325 mg Oral Q T,Th,S,Su   folic acid   1 mg Oral Daily   lactulose   20 g Oral Daily   multivitamin with minerals  1 tablet Oral Daily   potassium chloride   40 mEq Oral Once   thiamine   100 mg Oral Daily   Or   thiamine   100 mg Intravenous Daily   Continuous Infusions:  sodium chloride      dexmedetomidine  (PRECEDEX ) IV infusion 0.5 mcg/kg/hr (04/14/24 0916)   PRN Meds:.acetaminophen , LORazepam  **OR** LORazepam , ondansetron  **OR** ondansetron  (ZOFRAN ) IV   Anti-infectives (From admission, onward)    None         Subjective: [employer], no fever, no chest pain, no nausea or vomiting.  Good saturation on room air.   Objective: Vitals:   04/14/24 0930 04/14/24 1000 04/14/24 1100 04/14/24 1200  BP: (!) 135/90 (!) 140/100 (!) 143/104 (!) 140/101  Pulse: 72 89  77 82  Resp: 17 (!) 26 16 18   Temp:      TempSrc:      SpO2: 100% 100% 100% 100%  Weight:      Height:        Intake/Output Summary (Last 24 hours) at 04/14/2024 1412 Last data filed at 04/14/2024 1139 Gross per 24 hour  Intake 485.44 ml  Output 650 ml  Net -164.56 ml   Filed Weights   04/12/24 1714  Weight: 85.8 kg    Physical Exam General exam: Alert, awake, oriented x 2; able to follow simple commands.  No acute severe withdrawal symptoms currently appreciated. Respiratory system: Respiratory effort normal.  Good saturation on room air.  No using accessory muscles. Cardiovascular system:RRR. No  murmurs, rubs, gallops. Gastrointestinal system: Abdomen is nondistended, soft and nontender. No organomegaly or masses felt. Normal bowel sounds heard. Central nervous system: No focal neurological deficits. Extremities: No cyanosis or clubbing.  No petechiae. Skin: No open wounds appreciated. Psychiatry: Judgement and insight appear normal.  Flat affect appreciated on exam.   Data Reviewed: I have personally reviewed following labs and imaging studies  CBC: Recent Labs  Lab 04/12/24 2009 04/13/24 0533  WBC 12.8* 9.6  NEUTROABS 11.0*  --   HGB 14.7 12.6*  HCT 41.8 35.8*  MCV 91.3 91.1  PLT 217 159   Basic Metabolic Panel: Recent Labs  Lab 04/12/24 2009 04/13/24 0533 04/14/24 0327  NA 132* 134* 134*  K 2.9* 2.7* 3.8  CL 91* 97* 102  CO2 28 24 26   GLUCOSE 88 85 105*  BUN <5* <5* <5*  CREATININE 0.64 0.58* 0.58*  CALCIUM  7.7* 7.2* 7.5*  MG 1.3* 1.6* 1.9  PHOS  --  2.7 3.2   GFR: Estimated Creatinine Clearance: 129.9 mL/min (A) (by C-G formula based on SCr of 0.58 mg/dL (L)).  Liver Function Tests: Recent Labs  Lab 04/12/24 2009 04/13/24 0533 04/14/24 0327  AST 55* 43*  --   ALT 22 20  --   ALKPHOS 270* 226*  --   BILITOT 1.3* 1.3*  --   PROT 6.9 5.9*  --   ALBUMIN  2.3* 1.9* 1.9*   Recent Results (from the past 240 hours)  MRSA Next Gen by PCR, Nasal     Status: None   Collection Time: 04/13/24  1:10 AM   Specimen: Nasal Mucosa; Nasal Swab  Result Value Ref Range Status   MRSA by PCR Next Gen NOT DETECTED NOT DETECTED Final    Comment: (NOTE) The GeneXpert MRSA Assay (FDA approved for NASAL specimens only), is one component of a comprehensive MRSA colonization surveillance program. It is not intended to diagnose MRSA infection nor to guide or monitor treatment for MRSA infections. Test performance is not FDA approved in patients less than 71 years old. Performed at [facility], [address], [place], [place] [address]     Radiology Studies: CT  Head Wo Contrast Result Date: 04/12/2024 EXAM: CT HEAD AND CERVICAL SPINE 04/12/2024 07:24:29 PM TECHNIQUE: CT of the head and cervical spine was performed without the administration of intravenous contrast. Multiplanar reformatted images are provided for review. Automated exposure control, iterative reconstruction, and/or weight based adjustment of the mA/kV was utilized to reduce the radiation dose to as low as reasonably achievable. COMPARISON: CT head and cervical spine dated 11/25/2023. CLINICAL HISTORY: Head trauma, moderate-severe. Pt arrived via POV c/o ETOH withdrawal and reports recent seizure from going cold-turkey from alcohol . Pt also reports neuropathy and is currently unable to  walk. Pt reports last drink of alcohol  was yesterday. Pt also reports seizure-like activity, tremors, weakness, nausea, diarrhea. FINDINGS: CT HEAD BRAIN AND VENTRICLES: No acute intracranial hemorrhage. No mass effect or midline shift. No abnormal extra-axial fluid collection. No evidence of acute infarct. No hydrocephalus. ORBITS: No acute abnormality. SINUSES AND MASTOIDS: No acute abnormality. SOFT TISSUES AND SKULL: No acute skull fracture. No acute soft tissue abnormality. CT CERVICAL SPINE BONES AND ALIGNMENT: No acute fracture or traumatic malalignment. DEGENERATIVE CHANGES: Degenerative change in the thoracic spine at the atlantoaxial joint and C6-C7. Posterior disc osteophyte complex eccentric to the left at C6-C7 causes moderate effacement of the thecal sac. SOFT TISSUES: No prevertebral soft tissue swelling. IMPRESSION: 1. No acute intracranial abnormality. 2. No acute fracture or traumatic malalignment of the cervical spine. Electronically signed by: Norman Gatlin MD 04/12/2024 07:32 PM EDT RP Workstation: [id number]   CT Cervical Spine Wo Contrast Result Date: 04/12/2024 EXAM: CT HEAD AND CERVICAL SPINE 04/12/2024 07:24:29 PM TECHNIQUE: CT of the head and cervical spine was performed without the  administration of intravenous contrast. Multiplanar reformatted images are provided for review. Automated exposure control, iterative reconstruction, and/or weight based adjustment of the mA/kV was utilized to reduce the radiation dose to as low as reasonably achievable. COMPARISON: CT head and cervical spine dated 11/25/2023. CLINICAL HISTORY: Head trauma, moderate-severe. Pt arrived via POV c/o ETOH withdrawal and reports recent seizure from going cold-turkey from alcohol . Pt also reports neuropathy and is currently unable to walk. Pt reports last drink of alcohol  was yesterday. Pt also reports seizure-like activity, tremors, weakness, nausea, diarrhea. FINDINGS: CT HEAD BRAIN AND VENTRICLES: No acute intracranial hemorrhage. No mass effect or midline shift. No abnormal extra-axial fluid collection. No evidence of acute infarct. No hydrocephalus. ORBITS: No acute abnormality. SINUSES AND MASTOIDS: No acute abnormality. SOFT TISSUES AND SKULL: No acute skull fracture. No acute soft tissue abnormality. CT CERVICAL SPINE BONES AND ALIGNMENT: No acute fracture or traumatic malalignment. DEGENERATIVE CHANGES: Degenerative change in the thoracic spine at the atlantoaxial joint and C6-C7. Posterior disc osteophyte complex eccentric to the left at C6-C7 causes moderate effacement of the thecal sac. SOFT TISSUES: No prevertebral soft tissue swelling. IMPRESSION: 1. No acute intracranial abnormality. 2. No acute fracture or traumatic malalignment of the cervical spine. Electronically signed by: Norman Gatlin MD 04/12/2024 07:32 PM EDT RP Workstation: [id number]   Scheduled Meds:  Chlorhexidine  Gluconate Cloth  6 each Topical Daily   diazepam   5 mg Oral TID   diltiazem   180 mg Oral Daily   DULoxetine   60 mg Oral Daily   enoxaparin  (LOVENOX ) injection  40 mg Subcutaneous QHS   feeding supplement  237 mL Oral BID BM   ferrous sulfate   325 mg Oral Q T,Th,S,Su   folic acid   1 mg Oral Daily   lactulose   20 g Oral  Daily   multivitamin with minerals  1 tablet Oral Daily   potassium chloride   40 mEq Oral Once   thiamine   100 mg Oral Daily   Or   thiamine   100 mg Intravenous Daily   Continuous Infusions:  sodium chloride      dexmedetomidine  (PRECEDEX ) IV infusion 0.5 mcg/kg/hr (04/14/24 0916)    LOS: 2 days   Eric Nunnery M.D on 04/14/2024 at 2:12 PM  Go to [email or web address] - for contact info  [facility] - Office  [phone]  If 7PM-7AM, please contact night-coverage [email or web address] 04/14/2024, 2:12 PM

## 2024-04-14 NOTE — Telephone Encounter (Signed)
 Gave below to MD for review.

## 2024-04-14 NOTE — Telephone Encounter (Signed)
 Pt currently admitted to the hospital. Dr. Cinderella, please review and advise if okay to proceed with rescheduling or have him come back in for OV prior?

## 2024-04-14 NOTE — TOC Initial Note (Signed)
 Transition of Care [facility]) - Initial/Assessment Note    Patient Details  Name: Connor Copeland MRN: [id number] Date of Birth: Oct 29, 1975  [facility]) CM/SW Contact:    Lucie Lunger, LCSWA Phone Number: 04/14/2024, 9:35 AM  Clinical Narrative:                 Pt is high risk for readmission and known to [facility] from past hospital admissions. CSW spoke with pts mother to complete assessment. Pt lives alone and needs assistance with ADLs. Pts mother states that he has a cane and rollator to use when needed. PT eval is pending, pts mother states that they would be interested in [facility] being arranged, CSW explained we can attempt but due to insurance there may be no agencies accepting. TOC to follow.   Expected Discharge Plan: Home/Self Care Barriers to Discharge: Continued Medical Work up   Patient Goals and CMS Choice Patient states their goals for this hospitalization and ongoing recovery are:: return home CMS Medicare.gov Compare Post Acute Care list provided to:: Patient Represenative (must comment) Choice offered to / list presented to : Parent      Expected Discharge Plan and Services In-house Referral: Clinical Social Work Discharge Planning Services: CM Consult   Living arrangements for the past 2 months: Single Family Home                                      Prior Living Arrangements/Services Living arrangements for the past 2 months: Single Family Home Lives with:: Self Patient language and need for interpreter reviewed:: Yes Do you feel safe going back to the place where you live?: Yes      Need for Family Participation in Patient Care: Yes (Comment) Care giver support system in place?: Yes (comment) Current home services: DME Criminal Activity/Legal Involvement Pertinent to Current Situation/Hospitalization: No - Comment as needed  Activities of Daily Living      Permission Sought/Granted                  Emotional  Assessment Appearance:: Appears stated age       Alcohol  / Substance Use: Not Applicable Psych Involvement: No (comment)  Admission diagnosis:  Alcohol  withdrawal seizure without complication (HCC) [F10.930, R56.9] Perceptual disturbances and seizures concurrent with and due to alcohol  withdrawal (HCC) [Q89.067, R56.9] Patient Active Problem List   Diagnosis Date Noted   Perceptual disturbances and seizures concurrent with and due to alcohol  withdrawal (HCC) 04/12/2024   Physical deconditioning 04/12/2024   Abdominal aortic aneurysm (AAA) (HCC) 02/17/2024   Elevated liver enzymes 02/17/2024   Elevated serum immunoglobulin free light chains 01/09/2024   Subdural hematoma (HCC) 11/25/2023   Hepatic encephalopathy (HCC) 11/25/2023   Hyperammonemia (HCC) 11/25/2023   Hypomagnesemia 11/25/2023   Transaminitis 11/25/2023   Leukocytosis 11/25/2023   Thrombocytosis 11/25/2023   Obesity, Class II, BMI 35-39.9 11/25/2023   Open wound of left upper extremity 10/11/2023   Aspiration pneumonia (HCC) 10/11/2023   Elevated LFTs 10/11/2023   Obesity (BMI 30-39.9) 10/11/2023   Subdural hygroma 10/11/2023   Hypokalemia 10/06/2023   Lactic acidosis 10/06/2023   Acute kidney injury (HCC) 10/06/2023   High anion gap metabolic acidosis 10/06/2023   SVT (supraventricular tachycardia) (HCC) 08/13/2023   Severe alcohol  withdrawal with perceptual disturbances (HCC) 08/09/2023   Hyperglycemia 08/08/2023   Gait disturbance 08/04/2023   Dysesthesia 08/04/2023   Seizure disorder (HCC)  03/19/2023   Chronic inflammatory demyelinating neuropathy (HCC) 03/19/2023   Neuropathic pain 03/19/2023   Alcohol  withdrawal seizure with delirium (HCC) 01/28/2023   Alcohol  use disorder in remission 01/28/2023   Tobacco use disorder 01/28/2023   Hyponatremia 01/28/2023   Elevated MCV 01/28/2023   Anemia 01/28/2023   Elevated transaminase level 01/28/2023   Gout 01/28/2023   HLD (hyperlipidemia) 12/11/2022    Hypertriglyceridemia 12/11/2022   Peripheral neuropathy 12/11/2022   Hypoalbuminemia due to protein-calorie malnutrition (HCC) 12/11/2022   PCP:  [name], MD Pharmacy:   [facility] [place], [place] - [address] [place] [address] Phone: [phone] Fax: [phone]  [facility] [address] [place] [address] Phone: [phone] Fax: [phone]  [facility] [address] [place] [address] Phone: [phone] Fax: [phone]  [id number] - [place],  - [address] AT [facility] [address] [place] [address] Phone: [phone] Fax: [phone]     Social Drivers of Health (SDOH) Social History: SDOH Screenings   Food Insecurity: Food Insecurity Present (04/13/2024)  Housing: Low Risk  (04/13/2024)  Transportation Needs: No Transportation Needs (04/13/2024)  Utilities: Not At Risk (04/13/2024)  Depression (PHQ2-9): Low Risk  (02/20/2024)  Recent Concern: Depression (PHQ2-9) - Medium Risk (12/24/2023)  Tobacco Use: Medium Risk (04/12/2024)   SDOH Interventions:     Readmission Risk Interventions    04/14/2024    9:33 AM 11/26/2023    1:55 PM 08/19/2023   10:48 AM  Readmission Risk Prevention Plan  Transportation Screening Complete Complete Complete  PCP or Specialist Appt within 3-5 Days   Not Complete  HRI or Home Care Consult   Complete  Social Work Consult for Recovery Care Planning/Counseling   Complete  Palliative Care Screening   Not Applicable  Medication Review [employer]) Complete Complete Complete  PCP or Specialist appointment within 3-5 days of discharge  Not Complete   HRI or Home Care Consult Complete Complete   SW Recovery Care/Counseling Consult Complete Complete   Palliative Care Screening Not Applicable Not Applicable   Skilled Nursing Facility Not Applicable  Not Applicable

## 2024-04-15 DIAGNOSIS — R7401 Elevation of levels of liver transaminase levels: Secondary | ICD-10-CM | POA: Diagnosis not present

## 2024-04-15 DIAGNOSIS — F10932 Alcohol use, unspecified with withdrawal with perceptual disturbance: Secondary | ICD-10-CM | POA: Diagnosis not present

## 2024-04-15 DIAGNOSIS — E872 Acidosis, unspecified: Secondary | ICD-10-CM | POA: Diagnosis not present

## 2024-04-15 DIAGNOSIS — E871 Hypo-osmolality and hyponatremia: Secondary | ICD-10-CM | POA: Diagnosis not present

## 2024-04-15 MED ORDER — FEBUXOSTAT 40 MG PO TABS
80.0000 mg | ORAL_TABLET | Freq: Every day | ORAL | Status: DC
  Administered 2024-04-15 – 2024-04-18 (×4): 80 mg via ORAL
  Filled 2024-04-15 (×6): qty 2

## 2024-04-15 MED ORDER — OXYCODONE HCL 5 MG PO TABS
5.0000 mg | ORAL_TABLET | Freq: Four times a day (QID) | ORAL | Status: DC | PRN
  Administered 2024-04-15 – 2024-04-16 (×3): 5 mg via ORAL
  Filled 2024-04-15 (×3): qty 1

## 2024-04-15 MED ORDER — MAGNESIUM OXIDE -MG SUPPLEMENT 400 (240 MG) MG PO TABS
400.0000 mg | ORAL_TABLET | Freq: Two times a day (BID) | ORAL | Status: DC
  Administered 2024-04-15 – 2024-04-18 (×6): 400 mg via ORAL
  Filled 2024-04-15 (×6): qty 1

## 2024-04-15 MED ORDER — PANTOPRAZOLE SODIUM 40 MG PO TBEC
40.0000 mg | DELAYED_RELEASE_TABLET | Freq: Every day | ORAL | Status: DC
  Administered 2024-04-15 – 2024-04-18 (×4): 40 mg via ORAL
  Filled 2024-04-15 (×4): qty 1

## 2024-04-15 NOTE — Progress Notes (Signed)
 PROGRESS NOTE  Connor Copeland, is a 48 y.o. male, DOB - 16-Nov-1975, FMW:[id number]  Admit date - 04/12/2024   Admitting Physician Posey Maier, DO  Outpatient Primary MD for the patient is Connor Norleen PEDLAR, MD  LOS - 3  Chief Complaint  Patient presents with   Alcohol  Intoxication      Brief Narrative:  48 y.o. male with medical history significant of chronic inflammatory demyelinating polyneuropathy, alcohol  withdrawal seizure, gout, peripheral neuropathy, alcohol  abuse admitted on 04/13/2023 with alcohol  withdrawal syndrome    -Assessment and Plan: 1)Alcohol  Withdrawal/Full Blown DTs----Quite symptomatic and requiring frequent benzos iv -Head and CT C-spine without acute findings ----c/n iv Precedex  and the use of benzos per CIWA protocol - Continue folic acid  thiamine  multivitamin - Continue to wean patient off Precedex  and follow clinical response.  2)Hypomagnesemia/Hypokalemia/Hyponatremia---- in an alcoholic male Replace and Recheck  3)Chronic iron  Deficiency Anemia--no bleeding concerns at this time - Anemia woke up from 02/03/2024 shows low serum iron , low iron  saturation, folate and B12 WNL -- Give iron  tablets  4)Alcoholic Hepatitis--- AST :ALT ratio is > 2:1 consistent with alcoholic hepatitis - LFTs are trending down  5)History of chronic pain syndrome and chronic inflammatory related polyneuropathy/peripheral neuropathy in the setting of ongoing alcohol  abuse- -continue to follow-up with neurology for ongoing workup and management as an outpatient - Continue Cymbalta  - As needed oxycodone  has been started to assist with pain management - Continue as needed Tylenol .  6)Leukocytosis--- WBC has now normalized,  -Lactic acid has normalized - Suspect this is reactive due to fall and alcohol  withdrawal  7) history of gout - Resume the use of Uloric  - Continue as needed as needed pain medication.  Status is: Inpatient   Disposition: The patient is from: Home               Anticipated d/c is to: Home              Anticipated d/c date is: 2 days              Patient currently is not medically stable to d/c.  Barriers: Not Clinically Stable-   Code Status :  -  Code Status: Full Code   Family Communication:    NA (patient is alert, awake and coherent) -Called Mother at [phone] --no answer,  Left voicemail  DVT Prophylaxis  :   - SCDs   enoxaparin  (LOVENOX ) injection 40 mg Start: 04/12/24 2315 SCDs Start: 04/12/24 2312   Lab Results  Component Value Date   PLT 159 04/13/2024    Inpatient Medications  Scheduled Meds:  Chlorhexidine  Gluconate Cloth  6 each Topical Daily   diltiazem   180 mg Oral Daily   DULoxetine   60 mg Oral Daily   enoxaparin  (LOVENOX ) injection  40 mg Subcutaneous QHS   Febuxostat   1 tablet Oral Daily   feeding supplement  237 mL Oral BID BM   ferrous sulfate   325 mg Oral Q T,Th,S,Su   folic acid   1 mg Oral Daily   lactulose   20 g Oral Daily   magnesium  oxide  400 mg Oral BID   multivitamin with minerals  1 tablet Oral Daily   pantoprazole   40 mg Oral Daily   potassium chloride   40 mEq Oral Once   thiamine   100 mg Oral Daily   Or   thiamine   100 mg Intravenous Daily   Continuous Infusions:  dexmedetomidine  (PRECEDEX ) IV infusion 0.8 mcg/kg/hr (04/15/24 1616)   PRN Meds:.acetaminophen , LORazepam  **OR** LORazepam ,  ondansetron  **OR** ondansetron  (ZOFRAN ) IV, oxyCODONE    Anti-infectives (From admission, onward)    None         Subjective: [name] oriented x 3, following commands appropriately and in no major distress.  Complaining of significant pain in his legs and also his knees.  Patient with underlying history of CIDP, chronic pain syndrome and gout.  Objective: Vitals:   04/15/24 1230 04/15/24 1300 04/15/24 1400 04/15/24 1500  BP: 105/66 (!) 82/53 93/61   Pulse: 87 80 77 79  Resp: 19 20 (!) 24 (!) 24  Temp:      TempSrc:      SpO2: 98% 98% 98% 98%  Weight:      Height:         Intake/Output Summary (Last 24 hours) at 04/15/2024 1710 Last data filed at 04/15/2024 1235 Gross per 24 hour  Intake 2429.64 ml  Output 850 ml  Net 1579.64 ml   Filed Weights   04/12/24 1714  Weight: 85.8 kg    Physical Exam General exam: Alert, awake, oriented x 3; following commands appropriately.  Complaining of significant pain in his lower extremities and knees. Respiratory system: Clear to auscultation. Respiratory effort normal.  Good saturation on room air. Cardiovascular system:RRR. No rubs or gallops. Gastrointestinal system: Abdomen is nondistended, soft and nontender. No organomegaly or masses felt. Normal bowel sounds heard. Central nervous system: Generally weak.  No focal neurological deficits. Extremities: No cyanosis or clubbing. Skin: No petechiae. Psychiatry: Judgement and insight appear normal.  Flat affect appreciated on exam.   Data Reviewed: I have personally reviewed following labs and imaging studies  CBC: Recent Labs  Lab 04/12/24 2009 04/13/24 0533  WBC 12.8* 9.6  NEUTROABS 11.0*  --   HGB 14.7 12.6*  HCT 41.8 35.8*  MCV 91.3 91.1  PLT 217 159   Basic Metabolic Panel: Recent Labs  Lab 04/12/24 2009 04/13/24 0533 04/14/24 0327  NA 132* 134* 134*  K 2.9* 2.7* 3.8  CL 91* 97* 102  CO2 28 24 26   GLUCOSE 88 85 105*  BUN <5* <5* <5*  CREATININE 0.64 0.58* 0.58*  CALCIUM  7.7* 7.2* 7.5*  MG 1.3* 1.6* 1.9  PHOS  --  2.7 3.2   GFR: Estimated Creatinine Clearance: 129.9 mL/min (A) (by C-G formula based on SCr of 0.58 mg/dL (L)).  Liver Function Tests: Recent Labs  Lab 04/12/24 2009 04/13/24 0533 04/14/24 0327  AST 55* 43*  --   ALT 22 20  --   ALKPHOS 270* 226*  --   BILITOT 1.3* 1.3*  --   PROT 6.9 5.9*  --   ALBUMIN  2.3* 1.9* 1.9*   Recent Results (from the past 240 hours)  MRSA Next Gen by PCR, Nasal     Status: None   Collection Time: 04/13/24  1:10 AM   Specimen: Nasal Mucosa; Nasal Swab  Result Value Ref Range Status    MRSA by PCR Next Gen NOT DETECTED NOT DETECTED Final    Comment: (NOTE) The GeneXpert MRSA Assay (FDA approved for NASAL specimens only), is one component of a comprehensive MRSA colonization surveillance program. It is not intended to diagnose MRSA infection nor to guide or monitor treatment for MRSA infections. Test performance is not FDA approved in patients less than 37 years old. Performed at [facility], [address] [place], [place] [address]     Radiology Studies: No results found.  Scheduled Meds:  Chlorhexidine  Gluconate Cloth  6 each Topical Daily   diltiazem   180 mg Oral Daily   DULoxetine   60 mg Oral Daily   enoxaparin  (LOVENOX ) injection  40 mg Subcutaneous QHS   Febuxostat   1 tablet Oral Daily   feeding supplement  237 mL Oral BID BM   ferrous sulfate   325 mg Oral Q T,Th,S,Su   folic acid   1 mg Oral Daily   lactulose   20 g Oral Daily   magnesium  oxide  400 mg Oral BID   multivitamin with minerals  1 tablet Oral Daily   pantoprazole   40 mg Oral Daily   potassium chloride   40 mEq Oral Once   thiamine   100 mg Oral Daily   Or   thiamine   100 mg Intravenous Daily   Continuous Infusions:  dexmedetomidine  (PRECEDEX ) IV infusion 0.8 mcg/kg/hr (04/15/24 1616)    LOS: 3 days   Eric Nunnery M.D on 04/15/2024 at 5:10 PM  Go to [email or web address] - for contact info  [facility] - Office  [phone]  If 7PM-7AM, please contact night-coverage [email or web address] 04/15/2024, 5:10 PM

## 2024-04-15 NOTE — Plan of Care (Signed)

## 2024-04-16 DIAGNOSIS — E872 Acidosis, unspecified: Secondary | ICD-10-CM | POA: Diagnosis not present

## 2024-04-16 DIAGNOSIS — E871 Hypo-osmolality and hyponatremia: Secondary | ICD-10-CM | POA: Diagnosis not present

## 2024-04-16 DIAGNOSIS — F10932 Alcohol use, unspecified with withdrawal with perceptual disturbance: Secondary | ICD-10-CM | POA: Diagnosis not present

## 2024-04-16 DIAGNOSIS — R7401 Elevation of levels of liver transaminase levels: Secondary | ICD-10-CM | POA: Diagnosis not present

## 2024-04-16 LAB — BASIC METABOLIC PANEL WITH GFR
Anion gap: 7 (ref 5–15)
BUN: 11 mg/dL (ref 6–20)
CO2: 23 mmol/L (ref 22–32)
Calcium: 7.2 mg/dL — ABNORMAL LOW (ref 8.9–10.3)
Chloride: 97 mmol/L — ABNORMAL LOW (ref 98–111)
Creatinine, Ser: 0.57 mg/dL — ABNORMAL LOW (ref 0.61–1.24)
GFR, Estimated: >60 mL/min (ref >60)
Glucose, Bld: 107 mg/dL — ABNORMAL HIGH (ref 70–99)
Potassium: 3.6 mmol/L (ref 3.5–5.1)
Sodium: 127 mmol/L — ABNORMAL LOW (ref 135–145)

## 2024-04-16 LAB — CBC
HCT: 31.3 % — ABNORMAL LOW (ref 39.0–52.0)
Hemoglobin: 10.5 g/dL — ABNORMAL LOW (ref 13.0–17.0)
MCH: 32.4 pg (ref 26.0–34.0)
MCHC: 33.5 g/dL (ref 30.0–36.0)
MCV: 96.6 fL (ref 80.0–100.0)
Platelets: 85 K/uL — ABNORMAL LOW (ref 150–400)
RBC: 3.24 MIL/uL — ABNORMAL LOW (ref 4.22–5.81)
RDW: 15.3 % (ref 11.5–15.5)
WBC: 5.4 K/uL (ref 4.0–10.5)
nRBC: 0 % (ref 0.0–0.2)

## 2024-04-16 MED ORDER — OXYCODONE HCL 5 MG PO TABS
5.0000 mg | ORAL_TABLET | Freq: Four times a day (QID) | ORAL | Status: DC | PRN
  Administered 2024-04-16 – 2024-04-18 (×8): 10 mg via ORAL
  Filled 2024-04-16 (×8): qty 2

## 2024-04-16 MED ORDER — SODIUM CHLORIDE 0.9 % IV SOLN: INTRAVENOUS | Status: AC

## 2024-04-16 MED ORDER — METHYLPREDNISOLONE SODIUM SUCC 40 MG IJ SOLR
40.0000 mg | Freq: Every day | INTRAMUSCULAR | Status: DC
  Administered 2024-04-16 – 2024-04-18 (×3): 40 mg via INTRAVENOUS
  Filled 2024-04-16 (×3): qty 1

## 2024-04-16 MED ORDER — HYDROMORPHONE HCL 1 MG/ML IJ SOLN
0.5000 mg | INTRAMUSCULAR | Status: DC | PRN
  Administered 2024-04-16 – 2024-04-18 (×9): 1 mg via INTRAVENOUS
  Filled 2024-04-16 (×10): qty 1

## 2024-04-16 NOTE — Evaluation (Signed)
 Physical Therapy Evaluation Patient Details Name: Connor Copeland MRN: [id number] DOB: 03-19-1976 Today's Date: 04/16/2024  History of Present Illness  Connor Copeland is a 48 y.o. male with medical history significant of chronic inflammatory demyelinating polyneuropathy, alcohol  withdrawal seizure, gout, peripheral neuropathy, alcohol  abuse who presents to the emergency department from home via EMS due to a fall sustained at home. Patient was confused and was unable to provide history, history was obtained from ED physician and and mother at bedside.  Per report, patient had been alcohol  free for about 2 months, he then started to drink heavily again since the last 2 weeks, mother thought that he quit cold [place] yesterday.  He had an unwitnessed fall about 2 days ago and sustained an injury to the left temple.  EMS was activated and patient was reported to be actively seizing on arrival to the ED, he was noted with bleeding from the tongue due to biting of tongue, is also presents with urinary incontinence.   Clinical Impression  Patient demonstrates slow labored movement for sitting up at bedside with most difficulty moving BLE due to increasing pain, required repeated attempts before able to complete sit to stands due to weakness/pain and limited to a few side steps in room during transfer to [facility]. Patient tolerated sitting on BSC to finish bowel movement after therapy - nursing staff aware. Patient will benefit from continued skilled physical therapy in hospital and recommended venue below to increase strength, balance, endurance for safe ADLs and gait.           If plan is discharge home, recommend the following: A lot of help with bathing/dressing/bathroom;A lot of help with walking and/or transfers;Assist for transportation;Assistance with cooking/housework;Help with stairs or ramp for entrance   Can travel by private vehicle   No    Equipment Recommendations None  recommended by PT  Recommendations for Other Services       Functional Status Assessment Patient has had a recent decline in their functional status and demonstrates the ability to make significant improvements in function in a reasonable and predictable amount of time.     Precautions / Restrictions Precautions Precautions: Fall Recall of Precautions/Restrictions: Intact Restrictions Weight Bearing Restrictions Per Provider Order: No      Mobility  Bed Mobility Overal bed mobility: Needs Assistance Bed Mobility: Supine to Sit     Supine to sit: Mod assist     General bed mobility comments: increased time requiring frequent rest breaks due to c/o severe BLE pain with movement, pressure    Transfers Overall transfer level: Needs assistance Equipment used: Rolling walker (2 wheels) Transfers: Sit to/from Stand, Bed to chair/wheelchair/BSC Sit to Stand: Mod assist, Max assist   Step pivot transfers: Mod assist       General transfer comment: patient has most diffiuclty completing sit to stands due to BLE weakness/pain    Ambulation/Gait Ambulation/Gait assistance: Mod assist Gait Distance (Feet): 8 Feet Assistive device: Rolling walker (2 wheels) Gait Pattern/deviations: Decreased step length - right, Decreased step length - left, Decreased stride length, Trunk flexed, Knees buckling Gait velocity: slow     General Gait Details: limited to a few slow labored side steps with diffiuclty keeping knees locked when standing due to weakness, pain  Stairs            Wheelchair Mobility     Tilt Bed    Modified Rankin (Stroke Patients Only)       Balance Overall balance assessment: Needs assistance  Sitting-balance support: Feet supported, No upper extremity supported Sitting balance-Leahy Scale: Fair Sitting balance - Comments: seated at EOB   Standing balance support: Reliant on assistive device for balance, During functional activity, Bilateral upper  extremity supported Standing balance-Leahy Scale: Poor Standing balance comment: using RW                             Pertinent Vitals/Pain Pain Assessment Pain Assessment: Faces Faces Pain Scale: Hurts whole lot Pain Location: BLE Pain Descriptors / Indicators: Shooting, Grimacing, Discomfort, Sharp, Sore Pain Intervention(s): Limited activity within patient's tolerance, Monitored during session, Repositioned, Premedicated before session    Home Living Family/patient expects to be discharged to:: Private residence Living Arrangements: Parent Available Help at Discharge: Family;Available PRN/intermittently Type of Home: House Home Access: Stairs to enter Entrance Stairs-Rails: Right Entrance Stairs-Number of Steps: 2 in front, 6 at back   Home Layout: One level;Laundry or work area in [employer] Equipment: [place] - single point;Grab bars - Gaffer (4 wheels)      Prior Function Prior Level of Function : Independent/Modified Independent             Mobility Comments: Community ambulation without AD ADLs Comments: Independent     Extremity/Trunk Assessment   Upper Extremity Assessment Upper Extremity Assessment: Generalized weakness    Lower Extremity Assessment Lower Extremity Assessment: Generalized weakness    Cervical / Trunk Assessment Cervical / Trunk Assessment: Kyphotic  Communication   Communication Communication: No apparent difficulties    Cognition Arousal: Alert Behavior During Therapy: WFL for tasks assessed/performed                             Following commands: Intact       Cueing Cueing Techniques: Verbal cues, Tactile cues     General Comments      Exercises     Assessment/Plan    PT Assessment Patient needs continued PT services  PT Problem List Decreased strength;Decreased activity tolerance;Decreased balance;Decreased mobility;Pain       PT Treatment  Interventions DME instruction;Gait training;Stair training;Functional mobility training;Therapeutic activities;Therapeutic exercise;Balance training;Patient/family education    PT Goals (Current goals can be found in the Care Plan section)  Acute Rehab PT Goals Patient Stated Goal: return home PT Goal Formulation: With patient Time For Goal Achievement: 04/30/24 Potential to Achieve Goals: Good    Frequency Min 3X/week     Co-evaluation               AM-PAC PT 6 Clicks Mobility  Outcome Measure Help needed turning from your back to your side while in a flat bed without using bedrails?: A Lot Help needed moving from lying on your back to sitting on the side of a flat bed without using bedrails?: A Lot Help needed moving to and from a bed to a chair (including a wheelchair)?: A Lot Help needed standing up from a chair using your arms (e.g., wheelchair or bedside chair)?: A Lot Help needed to walk in hospital room?: A Lot Help needed climbing 3-5 steps with a railing? : Total 6 Click Score: 11    End of Session   Activity Tolerance: Patient tolerated treatment well;Patient limited by fatigue;Patient limited by pain Patient left: with call bell/phone within reach;Other (comment) (left seated on [facility]) Nurse Communication: Mobility status PT Visit Diagnosis: Unsteadiness on feet (R26.81);Other abnormalities of gait and mobility (R26.89);Muscle weakness (generalized) (M62.81)  Time: 8862-8786 PT Time Calculation (min) (ACUTE ONLY): 36 min   Charges:   PT Evaluation $PT Eval Moderate Complexity: 1 Mod PT Treatments $Therapeutic Activity: 23-37 mins PT General Charges $$ ACUTE PT VISIT: 1 Visit         2:19 PM, 04/16/24 Lynwood Music, MPT Physical Therapist with [facility] [phone] office [phone] mobile phone

## 2024-04-16 NOTE — Plan of Care (Signed)
  Problem: Activity: Goal: Risk for activity intolerance will decrease Outcome: Progressing   Problem: Nutrition: Goal: Adequate nutrition will be maintained Outcome: Progressing   Problem: Skin Integrity: Goal: Risk for impaired skin integrity will decrease Outcome: Progressing   Problem: Coping: Goal: Level of anxiety will decrease Outcome: Not Progressing   Problem: Safety: Goal: Ability to remain free from injury will improve Outcome: Not Progressing

## 2024-04-16 NOTE — Progress Notes (Signed)
 PROGRESS NOTE  Connor Copeland, is a 48 y.o. male, DOB - 1976-04-16, FMW:[id number]  Admit date - 04/12/2024   Admitting Physician Posey Maier, DO  Outpatient Primary MD for the patient is [name], MD  LOS - 4  Chief Complaint  Patient presents with   Alcohol  Intoxication      Brief Narrative:  48 y.o. male with medical history significant of chronic inflammatory demyelinating polyneuropathy, alcohol  withdrawal seizure, gout, peripheral neuropathy, alcohol  abuse admitted on 04/13/2023 with alcohol  withdrawal syndrome    -Assessment and Plan: 1)Alcohol  Withdrawal/Full Blown DTs----Quite symptomatic and requiring frequent benzos iv -Head and CT C-spine without acute findings ----c/n the use of lorazepam  per CIWA protocol - Patient now off Precedex , and demonstrating adequate mentation and insight. - Continue folic acid , thiamine  and multivitamin - Cessation counseling and complete abstinence discussed with patient.  2)Hypomagnesemia/Hypokalemia/Hyponatremia---- in an alcoholic male -Continue to maintain adequate hydration, replete as needed and follow electrolytes trend.  3)Chronic iron  Deficiency Anemia--no bleeding concerns at this time - Anemia woke up from 02/03/2024 shows low serum iron , low iron  saturation, folate and B12 WNL -- Give iron  tablets  4)Alcoholic Hepatitis--- AST :ALT ratio is > 2:1 consistent with alcoholic hepatitis - LFTs are trending down  5)History of chronic pain syndrome and chronic inflammatory related polyneuropathy/peripheral neuropathy in the setting of ongoing alcohol  abuse- -continue to follow-up with neurology for ongoing workup and management as an outpatient - Continue Cymbalta  - As needed oxycodone  for moderate pain and the use of IV Dilaudid  for severe pain has been started to assist with pain management - Continue as needed Tylenol .  6)Leukocytosis--- WBC has now normalized,  -Lactic acid has normalized - Suspect this is reactive  due to fall and alcohol  withdrawal  7) history of gout - Uloric  has been restarted - Treatment with Solu-Medrol  daily has been started - Continue as needed as needed pain medication.  8) physical deconditioning - PT/OT evaluation has been requested - Will follow recommendation - Patient might benefit of placement for rehab and conditioning after discharge.  Status is: Inpatient   Disposition: The patient is from: Home              Anticipated d/c is to: Home              Anticipated d/c date is: 2 days              Patient currently is not medically stable to d/c.  Barriers: Not Clinically Stable-   Code Status :  -  Code Status: Full Code   Family Communication:    NA (patient is alert, awake and coherent) -Called Mother at [phone] --no answer,  Left voicemail  DVT Prophylaxis  :   - SCDs   enoxaparin  (LOVENOX ) injection 40 mg Start: 04/12/24 2315 SCDs Start: 04/12/24 2312   Lab Results  Component Value Date   PLT 85 (L) 04/16/2024    Inpatient Medications  Scheduled Meds:  Chlorhexidine  Gluconate Cloth  6 each Topical Daily   diltiazem   180 mg Oral Daily   DULoxetine   60 mg Oral Daily   enoxaparin  (LOVENOX ) injection  40 mg Subcutaneous QHS   febuxostat   80 mg Oral Daily   feeding supplement  237 mL Oral BID BM   ferrous sulfate   325 mg Oral Q T,Th,S,Su   folic acid   1 mg Oral Daily   lactulose   20 g Oral Daily   magnesium  oxide  400 mg Oral BID   methylPREDNISolone  (SOLU-MEDROL )  injection  40 mg Intravenous Daily   multivitamin with minerals  1 tablet Oral Daily   pantoprazole   40 mg Oral Daily   potassium chloride   40 mEq Oral Once   thiamine   100 mg Oral Daily   Or   thiamine   100 mg Intravenous Daily   Continuous Infusions:  dexmedetomidine  (PRECEDEX ) IV infusion Stopped (04/16/24 0635)   PRN Meds:.acetaminophen , HYDROmorphone  (DILAUDID ) injection, ondansetron  **OR** ondansetron  (ZOFRAN ) IV, oxyCODONE    Anti-infectives (From admission, onward)     None         Subjective: Connor Copeland oriented x 3, demonstrating good insight and complaining of generalized pain affecting joints in his hands, knees, legs and lower back.  Patient reports pain to be excruciating at and causing him significant debility.  No fever, no nausea, no vomiting.  Objective: Vitals:   04/16/24 1200 04/16/24 1300 04/16/24 1400 04/16/24 1452  BP: 111/75 105/79 107/65 (!) 114/98  Pulse: (!) 116 (!) 101 99 (!) 104  Resp: (!) 23 18 (!) 21   Temp:    98.9 F (37.2 C)  TempSrc:    Oral  SpO2: 100% 98% 96% 99%  Weight:      Height:        Intake/Output Summary (Last 24 hours) at 04/16/2024 1545 Last data filed at 04/16/2024 1305 Gross per 24 hour  Intake 457.82 ml  Output 1700 ml  Net -1242.18 ml   Filed Weights   04/12/24 1714  Weight: 85.8 kg    Physical Exam General exam: Alert, awake, oriented x 3; following commands appropriately and in no acute distress. Respiratory system: Good saturation on room air. Cardiovascular system: Sinus tachycardia at times; no rubs, no gallops, no JVD. Gastrointestinal system: Abdomen is nondistended, soft and nontender. No organomegaly or masses felt. Normal bowel sounds heard. Central nervous system: Generally weak and deconditioned.  No focal neurological deficits. Extremities: No cyanosis or clubbing.  Some swelling appreciated in his small joints (right hand) bilateral knees and ankles. Skin: No petechiae. Psychiatry: Judgement and insight appear normal. Mood & affect appropriate.   Data Reviewed: I have personally reviewed following labs and imaging studies  CBC: Recent Labs  Lab 04/12/24 2009 04/13/24 0533 04/16/24 0558  WBC 12.8* 9.6 5.4  NEUTROABS 11.0*  --   --   HGB 14.7 12.6* 10.5*  HCT 41.8 35.8* 31.3*  MCV 91.3 91.1 96.6  PLT 217 159 85*   Basic Metabolic Panel: Recent Labs  Lab 04/12/24 2009 04/13/24 0533 04/14/24 0327 04/16/24 0558  NA 132* 134* 134* 127*  K 2.9* 2.7*  3.8 3.6  CL 91* 97* 102 97*  CO2 28 24 26 23   GLUCOSE 88 85 105* 107*  BUN <5* <5* <5* 11  CREATININE 0.64 0.58* 0.58* 0.57*  CALCIUM  7.7* 7.2* 7.5* 7.2*  MG 1.3* 1.6* 1.9  --   PHOS  --  2.7 3.2  --    GFR: Estimated Creatinine Clearance: 129.9 mL/min (A) (by C-G formula based on SCr of 0.57 mg/dL (L)).  Liver Function Tests: Recent Labs  Lab 04/12/24 2009 04/13/24 0533 04/14/24 0327  AST 55* 43*  --   ALT 22 20  --   ALKPHOS 270* 226*  --   BILITOT 1.3* 1.3*  --   PROT 6.9 5.9*  --   ALBUMIN  2.3* 1.9* 1.9*   Recent Results (from the past 240 hours)  MRSA Next Gen by PCR, Nasal     Status: None   Collection Time: 04/13/24  1:10  AM   Specimen: Nasal Mucosa; Nasal Swab  Result Value Ref Range Status   MRSA by PCR Next Gen NOT DETECTED NOT DETECTED Final    Comment: (NOTE) The GeneXpert MRSA Assay (FDA approved for NASAL specimens only), is one component of a comprehensive MRSA colonization surveillance program. It is not intended to diagnose MRSA infection nor to guide or monitor treatment for MRSA infections. Test performance is not FDA approved in patients less than 60 years old. Performed at [facility], [address], [place], [place] [address]     Radiology Studies: No results found.  Scheduled Meds:  Chlorhexidine  Gluconate Cloth  6 each Topical Daily   diltiazem   180 mg Oral Daily   DULoxetine   60 mg Oral Daily   enoxaparin  (LOVENOX ) injection  40 mg Subcutaneous QHS   febuxostat   80 mg Oral Daily   feeding supplement  237 mL Oral BID BM   ferrous sulfate   325 mg Oral Q T,Th,S,Su   folic acid   1 mg Oral Daily   lactulose   20 g Oral Daily   magnesium  oxide  400 mg Oral BID   methylPREDNISolone  (SOLU-MEDROL ) injection  40 mg Intravenous Daily   multivitamin with minerals  1 tablet Oral Daily   pantoprazole   40 mg Oral Daily   potassium chloride   40 mEq Oral Once   thiamine   100 mg Oral Daily   Or   thiamine   100 mg Intravenous Daily   Continuous  Infusions:  dexmedetomidine  (PRECEDEX ) IV infusion Stopped (04/16/24 9364)    LOS: 4 days   Eric Nunnery M.D on 04/16/2024 at 3:45 PM  Go to [email or web address] - for contact info  [facility] - Office  [phone]  If 7PM-7AM, please contact night-coverage [email or web address] 04/16/2024, 3:45 PM

## 2024-04-16 NOTE — NC FL2 (Signed)
 Connor Copeland  MEDICAID FL2 LEVEL OF CARE FORM     IDENTIFICATION  Patient Name: Connor Copeland Birthdate: 03-Jun-1976 Sex: male Admission Date (Current Location): 04/12/2024  [name] and [name] Number:  [name] and Address:  [name],  [address], [name] [address]      Provider Number: [id number]  Attending Physician Name and Address:  Ricky Fines, MD  Relative Name and Phone Number:       Current Level of Care: Hospital Recommended Level of Care: Skilled Nursing Facility Prior Approval Number:    Date Approved/Denied:   PASRR Number: [id number]  Discharge Plan: SNF    Current Diagnoses: Patient Active Problem List   Diagnosis Date Noted   Perceptual disturbances and seizures concurrent with and due to alcohol  withdrawal (HCC) 04/12/2024   Physical deconditioning 04/12/2024   Abdominal aortic aneurysm (AAA) (HCC) 02/17/2024   Elevated liver enzymes 02/17/2024   Elevated serum immunoglobulin free light chains 01/09/2024   Subdural hematoma (HCC) 11/25/2023   Hepatic encephalopathy (HCC) 11/25/2023   Hyperammonemia (HCC) 11/25/2023   Hypomagnesemia 11/25/2023   Transaminitis 11/25/2023   Leukocytosis 11/25/2023   Thrombocytosis 11/25/2023   Obesity, Class II, BMI 35-39.9 11/25/2023   Open wound of left upper extremity 10/11/2023   Aspiration pneumonia (HCC) 10/11/2023   Elevated LFTs 10/11/2023   Obesity (BMI 30-39.9) 10/11/2023   Subdural hygroma 10/11/2023   Hypokalemia 10/06/2023   Lactic acidosis 10/06/2023   Acute kidney injury (HCC) 10/06/2023   High anion gap metabolic acidosis 10/06/2023   SVT (supraventricular tachycardia) (HCC) 08/13/2023   Severe alcohol  withdrawal with perceptual disturbances (HCC) 08/09/2023   Hyperglycemia 08/08/2023   Gait disturbance 08/04/2023   Dysesthesia 08/04/2023   Seizure disorder (HCC) 03/19/2023   Chronic inflammatory demyelinating neuropathy (HCC) 03/19/2023   Neuropathic  pain 03/19/2023   Alcohol  withdrawal seizure with delirium (HCC) 01/28/2023   Alcohol  use disorder in remission 01/28/2023   Tobacco use disorder 01/28/2023   Hyponatremia 01/28/2023   Elevated MCV 01/28/2023   Anemia 01/28/2023   Elevated transaminase level 01/28/2023   Gout 01/28/2023   HLD (hyperlipidemia) 12/11/2022   Hypertriglyceridemia 12/11/2022   Peripheral neuropathy 12/11/2022   Hypoalbuminemia due to protein-calorie malnutrition (HCC) 12/11/2022    Orientation RESPIRATION BLADDER Height & Weight     Self, Time, Situation, Place  Normal Continent Weight: 189 lb 2.5 oz (85.8 kg) Height:  6' 2 (188 cm)  BEHAVIORAL SYMPTOMS/MOOD NEUROLOGICAL BOWEL NUTRITION STATUS      Continent Diet (Regular)  AMBULATORY STATUS COMMUNICATION OF NEEDS Skin   Extensive Assist Verbally Normal                       Personal Care Assistance Level of Assistance  Bathing, Feeding, Dressing Bathing Assistance: Limited assistance Feeding assistance: Independent Dressing Assistance: Limited assistance     Functional Limitations Info  Sight, Hearing, Speech Sight Info: Adequate Hearing Info: Adequate Speech Info: Adequate    SPECIAL CARE FACTORS FREQUENCY  PT (By licensed PT), OT (By licensed OT)     PT Frequency: 5 times weekly OT Frequency: 5 times weekly            Contractures Contractures Info: Not present    Additional Factors Info  Code Status, Allergies Code Status Info: FULL Allergies Info: Neurontin (gabapentin)           Current Medications (04/16/2024):  This is the current hospital active medication list Current Facility-Administered Medications  Medication Dose Route Frequency  Provider Last Rate Last Admin   acetaminophen  (TYLENOL ) tablet 650 mg  650 mg Oral Q6H PRN Pearlean, Courage, MD   650 mg at 04/16/24 0856   Chlorhexidine  Gluconate Cloth 2 % PADS 6 each  6 each Topical Daily Adefeso, Oladapo, DO   6 each at 04/16/24 0859   dexmedetomidine   (PRECEDEX ) 400 MCG/100ML (4 mcg/mL) infusion  0.4-1.2 mcg/kg/hr Intravenous Titrated Adefeso, Oladapo, DO   Stopped at 04/16/24 0635   diltiazem  (CARDIZEM  CD) 24 hr capsule 180 mg  180 mg Oral Daily Emokpae, Courage, MD   180 mg at 04/16/24 0857   DULoxetine  (CYMBALTA ) DR capsule 60 mg  60 mg Oral Daily Emokpae, Courage, MD   60 mg at 04/16/24 0856   enoxaparin  (LOVENOX ) injection 40 mg  40 mg Subcutaneous QHS Adefeso, Oladapo, DO   40 mg at 04/15/24 2208   febuxostat  (ULORIC ) tablet 80 mg  80 mg Oral Daily Ricky Fines, MD   80 mg at 04/16/24 0857   feeding supplement (ENSURE PLUS HIGH PROTEIN) liquid 237 mL  237 mL Oral BID BM Adefeso, Oladapo, DO   237 mL at 04/16/24 0902   ferrous sulfate  tablet 325 mg  325 mg Oral Q T,Th,S,Su Emokpae, Courage, MD   325 mg at 04/15/24 9177   folic acid  (FOLVITE ) tablet 1 mg  1 mg Oral Daily Adefeso, Oladapo, DO   1 mg at 04/16/24 0857   HYDROmorphone  (DILAUDID ) injection 0.5-1 mg  0.5-1 mg Intravenous Q4H PRN Ricky Fines, MD       lactulose  (CHRONULAC ) 10 GM/15ML solution 20 g  20 g Oral Daily Emokpae, Courage, MD   20 g at 04/15/24 9177   magnesium  oxide (MAG-OX) tablet 400 mg  400 mg Oral BID Ricky Fines, MD   400 mg at 04/16/24 9143   methylPREDNISolone  sodium succinate (SOLU-MEDROL ) 40 mg/mL injection 40 mg  40 mg Intravenous Daily Ricky Fines, MD       multivitamin with minerals tablet 1 tablet  1 tablet Oral Daily Adefeso, Oladapo, DO   1 tablet at 04/16/24 0857   ondansetron  (ZOFRAN ) tablet 4 mg  4 mg Oral Q6H PRN Adefeso, Oladapo, DO       Or   ondansetron  (ZOFRAN ) injection 4 mg  4 mg Intravenous Q6H PRN Adefeso, Oladapo, DO       oxyCODONE  (Oxy IR/ROXICODONE ) immediate release tablet 5-10 mg  5-10 mg Oral Q6H PRN Ricky Fines, MD       pantoprazole  (PROTONIX ) EC tablet 40 mg  40 mg Oral Daily Ricky Fines, MD   40 mg at 04/16/24 9142   potassium chloride  (KLOR-CON ) packet 40 mEq  40 mEq Oral Once Prosperi, Christian H, PA-C        thiamine  (VITAMIN B1) tablet 100 mg  100 mg Oral Daily Adefeso, Oladapo, DO   100 mg at 04/16/24 0900   Or   thiamine  (VITAMIN B1) injection 100 mg  100 mg Intravenous Daily Adefeso, Oladapo, DO         Discharge Medications: Please see discharge summary for a list of discharge medications.  Relevant Imaging Results:  Relevant Lab Results:   Additional Information SSN: [id number], [place]

## 2024-04-16 NOTE — TOC Progression Note (Signed)
 Transition of Care [facility]) - Progression Note    Patient Details  Name: Connor Copeland MRN: [id number] Date of Birth: [birth year]  [facility] of [facility]) CM/SW Contact  Lucie Lunger, [place] Phone Number: 04/16/2024, 2:48 PM  Clinical Narrative:    CSW updated that PT is recommending SNF placement for pt at D/C. CSW spoke with pt about SNF placement, at this time he is agreeable to SNF referral being sent out for review and prefers to go somewhere local. CSW to complete referral and send out for review. TOC to follow.   Expected Discharge Plan: Home/Self Care Barriers to Discharge: Continued Medical Work up               Expected Discharge Plan and Services In-house Referral: Clinical Social Work Discharge Planning Services: CM Consult   Living arrangements for the past 2 months: Single Family Home                                       Social Drivers of Health (SDOH) Interventions SDOH Screenings   Food Insecurity: Food Insecurity Present (04/13/2024)  Housing: Low Risk  (04/13/2024)  Transportation Needs: No Transportation Needs (04/13/2024)  Utilities: Not At Risk (04/13/2024)  Depression (PHQ2-9): Low Risk  (02/20/2024)  Recent Concern: Depression (PHQ2-9) - Medium Risk (12/24/2023)  Tobacco Use: Medium Risk (04/12/2024)    Readmission Risk Interventions    04/14/2024    9:33 AM 11/26/2023    1:55 PM 08/19/2023   10:48 AM  Readmission Risk Prevention Plan  Transportation Screening Complete Complete Complete  PCP or Specialist Appt within 3-5 Days   Not Complete  HRI or Home Care Consult   Complete  Social Work Consult for Recovery Care Planning/Counseling   Complete  Palliative Care Screening   Not Applicable  Medication Review Oceanographer) Complete Complete Complete  PCP or Specialist appointment within 3-5 days of discharge  Not Complete   HRI or Home Care Consult Complete Complete   SW Recovery Care/Counseling Consult Complete Complete    Palliative Care Screening Not Applicable Not Applicable   Skilled Nursing Facility Not Applicable Not Applicable

## 2024-04-16 NOTE — Plan of Care (Signed)
  Problem: Acute Rehab PT Goals(only PT should resolve) Goal: Pt Will Go Supine/Side To Sit Outcome: Progressing Flowsheets (Taken 04/16/2024 1420) Pt will go Supine/Side to Sit:  with supervision  with contact guard assist Goal: Patient Will Transfer Sit To/From Stand Outcome: Progressing Flowsheets (Taken 04/16/2024 1420) Patient will transfer sit to/from stand:  with supervision  with contact guard assist Goal: Pt Will Transfer Bed To Chair/Chair To Bed Outcome: Progressing Flowsheets (Taken 04/16/2024 1420) Pt will Transfer Bed to Chair/Chair to Bed:  with supervision  with contact guard assist Goal: Pt Will Ambulate Outcome: Progressing Flowsheets (Taken 04/16/2024 1420) Pt will Ambulate:  50 feet  with supervision  with contact guard assist  with rolling walker   2:21 PM, 04/16/24 Lynwood Music, MPT Physical Therapist with [facility] 336 [phone] office [phone] mobile phone

## 2024-04-17 DIAGNOSIS — F10932 Alcohol use, unspecified with withdrawal with perceptual disturbance: Secondary | ICD-10-CM | POA: Diagnosis not present

## 2024-04-17 DIAGNOSIS — E871 Hypo-osmolality and hyponatremia: Secondary | ICD-10-CM | POA: Diagnosis not present

## 2024-04-17 DIAGNOSIS — E872 Acidosis, unspecified: Secondary | ICD-10-CM | POA: Diagnosis not present

## 2024-04-17 DIAGNOSIS — R7401 Elevation of levels of liver transaminase levels: Secondary | ICD-10-CM | POA: Diagnosis not present

## 2024-04-17 NOTE — Progress Notes (Signed)
 PROGRESS NOTE  Connor Copeland, is a 48 y.o. male, DOB - May 01, 1976, FMW:[id number]  Admit date - 04/12/2024   Admitting Physician Posey Maier, DO  Outpatient Primary MD for the patient is [name], MD  LOS - 5  Chief Complaint  Patient presents with   Alcohol  Intoxication      Brief Narrative:  48 y.o. male with medical history significant of chronic inflammatory demyelinating polyneuropathy, alcohol  withdrawal seizure, gout, peripheral neuropathy, alcohol  abuse admitted on 04/13/2023 with alcohol  withdrawal syndrome    -Assessment and Plan: 1)Alcohol  Withdrawal/Full Blown DTs----Quite symptomatic and requiring frequent benzos iv -Head and CT C-spine without acute findings ----c/n the use of lorazepam  per CIWA protocol - Patient now off Precedex , and demonstrating adequate mentation and insight. - Continue folic acid , thiamine  and multivitamin - Cessation counseling and complete abstinence discussed with patient.  2)Hypomagnesemia/Hypokalemia/Hyponatremia---- in an alcoholic male -Continue to maintain adequate hydration, replete as needed and follow electrolytes trend.  3)Chronic iron  Deficiency Anemia--no bleeding concerns at this time - Anemia woke up from 02/03/2024 shows low serum iron , low iron  saturation, folate and B12 WNL -- Give iron  tablets  4)Alcoholic Hepatitis--- AST :ALT ratio is > 2:1 consistent with alcoholic hepatitis - LFTs are trending down  5)History of chronic pain syndrome and chronic inflammatory related polyneuropathy/peripheral neuropathy in the setting of ongoing alcohol  abuse- -continue to follow-up with neurology for ongoing workup and management as an outpatient - Continue Cymbalta  - As needed oxycodone  for moderate pain and the use of IV Dilaudid  for severe pain has been started to assist with pain management - Continue as needed Tylenol .  6)Leukocytosis--- WBC has now normalized,  -Lactic acid has normalized - Suspect this is reactive  due to fall and alcohol  withdrawal  7) history of gout - Uloric  has been restarted - Continue with Solu-Medrol  daily has been started - Continue as needed as needed pain medication.  8) physical deconditioning - PT/OT evaluation has been requested - Will follow recommendation - Patient might benefit of placement for rehab and conditioning after discharge.  Status is: Inpatient   Disposition: The patient is from: Home              Anticipated d/c is to: Home              Anticipated d/c date is: 2 days              Patient currently is not medically stable to d/c.  Barriers: Not Clinically Stable-   Code Status :  -  Code Status: Full Code   Family Communication:    NA (patient is alert, awake and coherent) -Called Mother at [phone] --no answer,  Left voicemail  DVT Prophylaxis  :   - SCDs   enoxaparin  (LOVENOX ) injection 40 mg Start: 04/12/24 2315 SCDs Start: 04/12/24 2312   Lab Results  Component Value Date   PLT 85 (L) 04/16/2024    Inpatient Medications  Scheduled Meds:  diltiazem   180 mg Oral Daily   DULoxetine   60 mg Oral Daily   enoxaparin  (LOVENOX ) injection  40 mg Subcutaneous QHS   febuxostat   80 mg Oral Daily   feeding supplement  237 mL Oral BID BM   ferrous sulfate   325 mg Oral Q T,Th,S,Su   folic acid   1 mg Oral Daily   lactulose   20 g Oral Daily   magnesium  oxide  400 mg Oral BID   methylPREDNISolone  (SOLU-MEDROL ) injection  40 mg Intravenous Daily   multivitamin with  minerals  1 tablet Oral Daily   pantoprazole   40 mg Oral Daily   potassium chloride   40 mEq Oral Once   thiamine   100 mg Oral Daily   Or   thiamine   100 mg Intravenous Daily   Continuous Infusions:   PRN Meds:.acetaminophen , HYDROmorphone  (DILAUDID ) injection, ondansetron  **OR** ondansetron  (ZOFRAN ) IV, oxyCODONE    Anti-infectives (From admission, onward)    None         Subjective: Connor Copeland afebrile, no chest pain, no nausea, no vomiting.  Reports  feeling better and noticing less pain and increased range of motion in his joints.  Objective: Vitals:   04/16/24 1810 04/16/24 1900 04/17/24 0600 04/17/24 1335  BP:  129/89 (!) 141/100 (!) 135/101  Pulse: 100 (!) 111 (!) 109 (!) 106  Resp:  20 18 18   Temp:  99.4 F (37.4 C) 98.9 F (37.2 C) 98.2 F (36.8 C)  TempSrc:  Oral Oral Oral  SpO2:  96% 92% 96%  Weight:      Height:        Intake/Output Summary (Last 24 hours) at 04/17/2024 1623 Last data filed at 04/17/2024 1555 Gross per 24 hour  Intake 2500.24 ml  Output 4250 ml  Net -1749.76 ml   Filed Weights   04/12/24 1714  Weight: 85.8 kg    Physical Exam General exam: Alert, awake, oriented x 3; afebrile and following commands appropriately.  In no major distress.  Expressed feeling better. Respiratory system: Clear to auscultation. Respiratory effort normal.  Good saturation on room air. Cardiovascular system:RRR. No murmurs, rubs, gallops. Gastrointestinal system: Abdomen is nondistended, soft and nontender. No organomegaly or masses felt. Normal bowel sounds heard. Central nervous system: Alert and oriented. No focal neurological deficits. Extremities: No cyanosis or clubbing; still reporting some joint pain (but improved); also noticing increased range of motion. Skin: No petechiae. Psychiatry:  Mood & affect appropriate.   Data Reviewed: I have personally reviewed following labs and imaging studies  CBC: Recent Labs  Lab 04/12/24 2009 04/13/24 0533 04/16/24 0558  WBC 12.8* 9.6 5.4  NEUTROABS 11.0*  --   --   HGB 14.7 12.6* 10.5*  HCT 41.8 35.8* 31.3*  MCV 91.3 91.1 96.6  PLT 217 159 85*   Basic Metabolic Panel: Recent Labs  Lab 04/12/24 2009 04/13/24 0533 04/14/24 0327 04/16/24 0558  NA 132* 134* 134* 127*  K 2.9* 2.7* 3.8 3.6  CL 91* 97* 102 97*  CO2 28 24 26 23   GLUCOSE 88 85 105* 107*  BUN <5* <5* <5* 11  CREATININE 0.64 0.58* 0.58* 0.57*  CALCIUM  7.7* 7.2* 7.5* 7.2*  MG 1.3* 1.6* 1.9  --    PHOS  --  2.7 3.2  --    GFR: Estimated Creatinine Clearance: 129.9 mL/min (A) (by C-G formula based on SCr of 0.57 mg/dL (L)).  Liver Function Tests: Recent Labs  Lab 04/12/24 2009 04/13/24 0533 04/14/24 0327  AST 55* 43*  --   ALT 22 20  --   ALKPHOS 270* 226*  --   BILITOT 1.3* 1.3*  --   PROT 6.9 5.9*  --   ALBUMIN  2.3* 1.9* 1.9*   Recent Results (from the past 240 hours)  MRSA Next Gen by PCR, Nasal     Status: None   Collection Time: 04/13/24  1:10 AM   Specimen: Nasal Mucosa; Nasal Swab  Result Value Ref Range Status   MRSA by PCR Next Gen NOT DETECTED NOT DETECTED Final    Comment: (  NOTE) The GeneXpert MRSA Assay (FDA approved for NASAL specimens only), is one component of a comprehensive MRSA colonization surveillance program. It is not intended to diagnose MRSA infection nor to guide or monitor treatment for MRSA infections. Test performance is not FDA approved in patients less than 54 years old. Performed at [facility], [address], [place], [place] [address]     Radiology Studies: No results found.  Scheduled Meds:  diltiazem   180 mg Oral Daily   DULoxetine   60 mg Oral Daily   enoxaparin  (LOVENOX ) injection  40 mg Subcutaneous QHS   febuxostat   80 mg Oral Daily   feeding supplement  237 mL Oral BID BM   ferrous sulfate   325 mg Oral Q T,Th,S,Su   folic acid   1 mg Oral Daily   lactulose   20 g Oral Daily   magnesium  oxide  400 mg Oral BID   methylPREDNISolone  (SOLU-MEDROL ) injection  40 mg Intravenous Daily   multivitamin with minerals  1 tablet Oral Daily   pantoprazole   40 mg Oral Daily   potassium chloride   40 mEq Oral Once   thiamine   100 mg Oral Daily   Or   thiamine   100 mg Intravenous Daily   Continuous Infusions:    LOS: 5 days   Eric Nunnery M.D on 04/17/2024 at 4:23 PM  Go to [email or web address] - for contact info  [facility] - Office  [phone]  If 7PM-7AM, please contact night-coverage [email or web address] 04/17/2024, 4:23 PM

## 2024-04-17 NOTE — Plan of Care (Signed)
  Problem: Clinical Measurements: Goal: Ability to maintain clinical measurements within normal limits will improve Outcome: Progressing   Problem: Coping: Goal: Level of anxiety will decrease Outcome: Progressing   Problem: Pain Managment: Goal: General experience of comfort will improve and/or be controlled Outcome: Progressing   Problem: Safety: Goal: Ability to remain free from injury will improve Outcome: Progressing   Problem: Skin Integrity: Goal: Risk for impaired skin integrity will decrease Outcome: Progressing   Problem: Safety: Goal: Verbalization of understanding the information provided will improve Outcome: Progressing

## 2024-04-17 NOTE — Plan of Care (Signed)

## 2024-04-18 DIAGNOSIS — F10932 Alcohol use, unspecified with withdrawal with perceptual disturbance: Secondary | ICD-10-CM | POA: Diagnosis not present

## 2024-04-18 DIAGNOSIS — R5381 Other malaise: Secondary | ICD-10-CM

## 2024-04-18 DIAGNOSIS — R7401 Elevation of levels of liver transaminase levels: Secondary | ICD-10-CM | POA: Diagnosis not present

## 2024-04-18 DIAGNOSIS — E871 Hypo-osmolality and hyponatremia: Secondary | ICD-10-CM | POA: Diagnosis not present

## 2024-04-18 MED ORDER — FOLIC ACID 1 MG PO TABS: 1.0000 mg | ORAL_TABLET | Freq: Every day | ORAL | 3 refills | Status: AC

## 2024-04-18 MED ORDER — OXYCODONE HCL 5 MG PO TABS: 5.0000 mg | ORAL_TABLET | Freq: Four times a day (QID) | ORAL | 0 refills | Status: AC | PRN

## 2024-04-18 MED ORDER — CHLORDIAZEPOXIDE HCL 5 MG PO CAPS: ORAL_CAPSULE | ORAL | 0 refills | Status: DC

## 2024-04-18 MED ORDER — THIAMINE HCL 100 MG PO TABS: 100.0000 mg | ORAL_TABLET | Freq: Every day | ORAL | 3 refills | Status: AC

## 2024-04-18 MED ORDER — PREDNISONE 20 MG PO TABS: ORAL_TABLET | ORAL | 0 refills | Status: DC

## 2024-04-18 NOTE — Progress Notes (Deleted)
 Patient discharged to home. DC instructions given with wife, son and daughter at bedside. Verbalized no concerns. Left unit in wheelchair pushed by this writer and accompanied by daughter and son. Left in stable condition. Pt and family encouraged to stop by pharmacy and pick up pain medication e-prescribed by provider. All verbalized understanding.

## 2024-04-18 NOTE — Discharge Summary (Signed)
 Physician Discharge Summary   Patient: Connor Copeland MRN: [id number] DOB: 1976-04-12  Admit date:     04/12/2024  Discharge date: 04/18/24  Discharge Physician: Eric Nunnery   PCP: [name], MD   Recommendations at discharge:  Repeat basic metabolic panel due for electrolytes and renal function Repeat CBC to follow hemoglobin trend/stability Continue assisting patient with alcohol  abuse Repeat LFTs to follow trend and stability  Discharge Diagnoses: Principal Problem:   Perceptual disturbances and seizures concurrent with and due to alcohol  withdrawal (HCC) Active Problems:   Hypoalbuminemia due to protein-calorie malnutrition (HCC)   Hyponatremia   Hypokalemia   Lactic acidosis   Hypomagnesemia   Transaminitis   Physical deconditioning  Brief Narrative:  48 y.o. male with medical history significant of chronic inflammatory demyelinating polyneuropathy, alcohol  withdrawal seizure, gout, peripheral neuropathy, alcohol  abuse admitted on 04/13/2023 with alcohol  withdrawal syndrome  Assessment and Plan: 1)Alcohol  Withdrawal/Full Blown DTs----Quite symptomatic and requiring frequent benzos iv -Head and CT C-spine without acute findings -No significant withdrawal present at discharge - Librium  taper and prescribe - Cessation counseling provided - Continue the use of folic acid  and thiamine . - Patient require Precedex  and lorazepam  as part of treatment during this hospitalization.   2)Hypomagnesemia/Hypokalemia/Hyponatremia---- in an alcoholic male -Continue to maintain adequate hydration, replete as needed and follow electrolytes trend. -Within normal limits at discharge.   3)Chronic iron  Deficiency Anemia--no bleeding concerns at this time - Anemia woke up from 02/03/2024 shows low serum iron , low iron  saturation, folate and B12 WNL -Resume outpatient iron  tablets.   4)Alcoholic Hepatitis--- AST :ALT ratio is > 2:1 consistent with alcoholic hepatitis - LFTs are  trending down and adequately stabilized at discharge. - Repeat levels of follow-up visit to assess trend.   5)History of chronic pain syndrome and chronic inflammatory related polyneuropathy/peripheral neuropathy in the setting of ongoing alcohol  abuse- -continue to follow-up with neurology for ongoing workup and management as an outpatient - Continue Cymbalta  - As needed oxycodone  for severe pain has been prescribed at discharge. - Patient to resume home analgesic therapy and outpatient follow-up with pain clinic if needed.   6)Leukocytosis--- WBC has now normalized,  -Lactic acid has normalized - Suspect this is reactive due to fall and alcohol  withdrawal- -repeat CBC at follow-up visit to assess disease trend/stability. -No sources or signs of acute infection hospitalization or abdominal discharge.   7) history of gout - Uloric  has been restarted - Continue steroids tapering and as needed analgesics.   8) physical deconditioning - PT/OT evaluation has been requested - Will follow recommendation - Patient with significant improvement in his mobility and stability after analgesics and steroids.  Discharged home with outpatient follow-up.  Consultants: None Procedures performed: See below for x-ray reports. Disposition: Home Diet recommendation: Heart healthy diet.  DISCHARGE MEDICATION: Allergies as of 04/18/2024       Reactions   Neurontin [gabapentin] Swelling   Leg swelling        Medication List     TAKE these medications    chlordiazePOXIDE  5 MG capsule Commonly known as: LIBRIUM  Take 1 tablet by mouth 3 times a day x 2 days; then 1 tablet by mouth twice a day x 2 days; then 1 tablet by mouth daily x 3 days and stop Librium .   diltiazem  120 MG 24 hr capsule Commonly known as: CARDIZEM  CD Take 1 capsule (120 mg total) by mouth daily.   DULoxetine  60 MG capsule Commonly known as: CYMBALTA  Take 2 capsules (120 mg total)  by mouth daily.   Febuxostat  80 MG  Tabs Take 1 tablet by mouth daily.   fluticasone 50 MCG/ACT nasal spray Commonly known as: FLONASE Place 1 spray into both nostrils daily.   folic acid  1 MG tablet Commonly known as: FOLVITE  Take 1 tablet (1 mg total) by mouth daily.   magnesium  oxide 400 (240 Mg) MG tablet Commonly known as: MAG-OX Take 1 tablet (400 mg total) by mouth 2 (two) times daily.   multivitamin with minerals Tabs tablet Take 1 tablet by mouth daily.   nortriptyline  25 MG capsule Commonly known as: PAMELOR  Take 2 capsules (50 mg total) by mouth at bedtime.   Oxcarbazepine  300 MG tablet Commonly known as: TRILEPTAL  300 milligrams daily and 600 mg nightly   oxyCODONE  5 MG immediate release tablet Commonly known as: Oxy IR/ROXICODONE  Take 1-2 tablets (5-10 mg total) by mouth every 6 (six) hours as needed for up to 3 days for severe pain (pain score 7-10).   pantoprazole  40 MG tablet Commonly known as: Protonix  Take 1 tablet (40 mg total) by mouth daily.   potassium chloride  SA 20 MEQ tablet Commonly known as: KLOR-CON  M Take 1 tablet (20 mEq total) by mouth daily.   predniSONE  20 MG tablet Commonly known as: DELTASONE  Take 2 tablet by mouth daily x 2 days; then 1 tablet by mouth daily x 3 days; then half tablet by mouth daily x 3 days and stop prednisone .   thiamine  100 MG tablet Commonly known as: VITAMIN B1 Take 1 tablet (100 mg total) by mouth daily.        Follow-up Information     [name], MD. Schedule an appointment as soon as possible for a visit in 1 week(s).   Specialty: Internal Medicine Contact information: [address] [name] [facility] [address] [phone]                Discharge Exam: Filed Weights   04/12/24 1714  Weight: 85.8 kg   General exam: Alert, awake, oriented x 3; following commands appropriately and in no acute distress. Respiratory system: Good saturation on room air. Cardiovascular system: Sinus tachycardia at times; no rubs, no  gallops, no JVD. Gastrointestinal system: Abdomen is nondistended, soft and nontender. No organomegaly or masses felt. Normal bowel sounds heard. Central nervous system: Generally weak and deconditioned.  No focal neurological deficits. Extremities: No cyanosis or clubbing.  Some swelling appreciated in his small joints (right hand) bilateral knees and ankles. Skin: No petechiae. Psychiatry: Judgement and insight appear normal. Mood & affect appropriate.   Condition at discharge: Stable and improved.  The results of significant diagnostics from this hospitalization (including imaging, microbiology, ancillary and laboratory) are listed below for reference.   Imaging Studies: CT Head Wo Contrast Result Date: 04/12/2024 EXAM: CT HEAD AND CERVICAL SPINE 04/12/2024 07:24:29 PM TECHNIQUE: CT of the head and cervical spine was performed without the administration of intravenous contrast. Multiplanar reformatted images are provided for review. Automated exposure control, iterative reconstruction, and/or weight based adjustment of the mA/kV was utilized to reduce the radiation dose to as low as reasonably achievable. COMPARISON: CT head and cervical spine dated 11/25/2023. CLINICAL HISTORY: Head trauma, moderate-severe. Pt arrived via POV c/o ETOH withdrawal and reports recent seizure from going cold-turkey from alcohol . Pt also reports neuropathy and is currently unable to walk. Pt reports last drink of alcohol  was yesterday. Pt also reports seizure-like activity, tremors, weakness, nausea, diarrhea. FINDINGS: CT HEAD BRAIN AND VENTRICLES: No acute intracranial  hemorrhage. No mass effect or midline shift. No abnormal extra-axial fluid collection. No evidence of acute infarct. No hydrocephalus. ORBITS: No acute abnormality. SINUSES AND MASTOIDS: No acute abnormality. SOFT TISSUES AND SKULL: No acute skull fracture. No acute soft tissue abnormality. CT CERVICAL SPINE BONES AND ALIGNMENT: No acute fracture or  traumatic malalignment. DEGENERATIVE CHANGES: Degenerative change in the thoracic spine at the atlantoaxial joint and C6-C7. Posterior disc osteophyte complex eccentric to the left at C6-C7 causes moderate effacement of the thecal sac. SOFT TISSUES: No prevertebral soft tissue swelling. IMPRESSION: 1. No acute intracranial abnormality. 2. No acute fracture or traumatic malalignment of the cervical spine. Electronically signed by: Norman Gatlin MD 04/12/2024 07:32 PM EDT RP Workstation: [id number]   CT Cervical Spine Wo Contrast Result Date: 04/12/2024 EXAM: CT HEAD AND CERVICAL SPINE 04/12/2024 07:24:29 PM TECHNIQUE: CT of the head and cervical spine was performed without the administration of intravenous contrast. Multiplanar reformatted images are provided for review. Automated exposure control, iterative reconstruction, and/or weight based adjustment of the mA/kV was utilized to reduce the radiation dose to as low as reasonably achievable. COMPARISON: CT head and cervical spine dated 11/25/2023. CLINICAL HISTORY: Head trauma, moderate-severe. Pt arrived via POV c/o ETOH withdrawal and reports recent seizure from going cold-turkey from alcohol . Pt also reports neuropathy and is currently unable to walk. Pt reports last drink of alcohol  was yesterday. Pt also reports seizure-like activity, tremors, weakness, nausea, diarrhea. FINDINGS: CT HEAD BRAIN AND VENTRICLES: No acute intracranial hemorrhage. No mass effect or midline shift. No abnormal extra-axial fluid collection. No evidence of acute infarct. No hydrocephalus. ORBITS: No acute abnormality. SINUSES AND MASTOIDS: No acute abnormality. SOFT TISSUES AND SKULL: No acute skull fracture. No acute soft tissue abnormality. CT CERVICAL SPINE BONES AND ALIGNMENT: No acute fracture or traumatic malalignment. DEGENERATIVE CHANGES: Degenerative change in the thoracic spine at the atlantoaxial joint and C6-C7. Posterior disc osteophyte complex eccentric to the left  at C6-C7 causes moderate effacement of the thecal sac. SOFT TISSUES: No prevertebral soft tissue swelling. IMPRESSION: 1. No acute intracranial abnormality. 2. No acute fracture or traumatic malalignment of the cervical spine. Electronically signed by: Norman Gatlin MD 04/12/2024 07:32 PM EDT RP Workstation: [id number]    Microbiology: Results for orders placed or performed during the hospital encounter of 04/12/24  MRSA Next Gen by PCR, Nasal     Status: None   Collection Time: 04/13/24  1:10 AM   Specimen: Nasal Mucosa; Nasal Swab  Result Value Ref Range Status   MRSA by PCR Next Gen NOT DETECTED NOT DETECTED Final    Comment: (NOTE) The GeneXpert MRSA Assay (FDA approved for NASAL specimens only), is one component of a comprehensive MRSA colonization surveillance program. It is not intended to diagnose MRSA infection nor to guide or monitor treatment for MRSA infections. Test performance is not FDA approved in patients less than 7 years old. Performed at [facility], [address], [place], [place] [address]     Labs: CBC: Recent Labs  Lab 04/12/24 2009 04/13/24 0533 04/16/24 0558  WBC 12.8* 9.6 5.4  NEUTROABS 11.0*  --   --   HGB 14.7 12.6* 10.5*  HCT 41.8 35.8* 31.3*  MCV 91.3 91.1 96.6  PLT 217 159 85*   Basic Metabolic Panel: Recent Labs  Lab 04/12/24 2009 04/13/24 0533 04/14/24 0327 04/16/24 0558  NA 132* 134* 134* 127*  K 2.9* 2.7* 3.8 3.6  CL 91* 97* 102 97*  CO2 28 24 26 23   GLUCOSE 88  85 105* 107*  BUN <5* <5* <5* 11  CREATININE 0.64 0.58* 0.58* 0.57*  CALCIUM  7.7* 7.2* 7.5* 7.2*  MG 1.3* 1.6* 1.9  --   PHOS  --  2.7 3.2  --    Liver Function Tests: Recent Labs  Lab 04/12/24 2009 04/13/24 0533 04/14/24 0327  AST 55* 43*  --   ALT 22 20  --   ALKPHOS 270* 226*  --   BILITOT 1.3* 1.3*  --   PROT 6.9 5.9*  --   ALBUMIN  2.3* 1.9* 1.9*   CBG: No results for input(s): GLUCAP in the last 168 hours.  Discharge time spent:  35  minutes.  Signed: Eric Nunnery, MD [facility] 04/18/2024

## 2024-04-18 NOTE — Progress Notes (Signed)
 Patient discharged to home. DC instructions given. Verbalized no concerns. Left unit in stable condition accompanied by nurse tech. Encouraged to stop by his pharmacy and pick up medications that were e-prescribed by Provider. Verbalized understanding.

## 2024-04-18 NOTE — Plan of Care (Signed)
  Problem: Education: Goal: Knowledge of General Education information will improve Description: Including pain rating scale, medication(s)/side effects and non-pharmacologic comfort measures Outcome: Progressing   Problem: Health Behavior/Discharge Planning: Goal: Ability to manage health-related needs will improve Outcome: Progressing   Problem: Clinical Measurements: Goal: Ability to maintain clinical measurements within normal limits will improve Outcome: Progressing Goal: Will remain free from infection Outcome: Progressing Goal: Diagnostic test results will improve Outcome: Progressing Goal: Respiratory complications will improve Outcome: Progressing Goal: Cardiovascular complication will be avoided Outcome: Progressing   Problem: Activity: Goal: Risk for activity intolerance will decrease Outcome: Progressing   Problem: Nutrition: Goal: Adequate nutrition will be maintained Outcome: Progressing   Problem: Coping: Goal: Level of anxiety will decrease Outcome: Progressing   Problem: Elimination: Goal: Will not experience complications related to bowel motility Outcome: Progressing Goal: Will not experience complications related to urinary retention Outcome: Progressing   Problem: Pain Managment: Goal: General experience of comfort will improve and/or be controlled Outcome: Progressing   Problem: Safety: Goal: Ability to remain free from injury will improve Outcome: Progressing   Problem: Skin Integrity: Goal: Risk for impaired skin integrity will decrease Outcome: Progressing   Problem: Safety: Goal: Verbalization of understanding the information provided will improve Outcome: Progressing

## 2024-04-19 NOTE — Telephone Encounter (Signed)
 Called pt, aware will need to see patient in office prior to rescheduling procedure. He is aware Devere will call to schedule

## 2024-04-23 ENCOUNTER — Encounter: Payer: Self-pay | Admitting: Student

## 2024-04-23 ENCOUNTER — Ambulatory Visit: Attending: Student | Admitting: Student

## 2024-04-23 VITALS — BP 108/72 | HR 84 | Ht 73.0 in | Wt 183.4 lb

## 2024-04-23 DIAGNOSIS — I77810 Thoracic aortic ectasia: Secondary | ICD-10-CM | POA: Diagnosis not present

## 2024-04-23 DIAGNOSIS — G6181 Chronic inflammatory demyelinating polyneuritis: Secondary | ICD-10-CM | POA: Diagnosis not present

## 2024-04-23 DIAGNOSIS — I471 Supraventricular tachycardia, unspecified: Secondary | ICD-10-CM

## 2024-04-23 DIAGNOSIS — I2510 Atherosclerotic heart disease of native coronary artery without angina pectoris: Secondary | ICD-10-CM | POA: Diagnosis not present

## 2024-04-23 MED ORDER — DILTIAZEM HCL ER COATED BEADS 120 MG PO CP24: 120.0000 mg | ORAL_CAPSULE | Freq: Every day | ORAL | 3 refills | Status: DC

## 2024-04-23 NOTE — Progress Notes (Signed)
 Cardiology Office Note    Date:  04/23/2024  ID:  Connor Copeland, DOB 03-04-1976, MRN [id number] Cardiologist: Diannah SHAUNNA Maywood, MD { :  History of Present Illness:    Connor Copeland is a 48 y.o. male with past medical history of chronic inflammatory demyelinating polyneuropathy, alcohol  use, seizure disorder, dilated aortic root and gout who presents to the office today for hospital follow-up.   He was admitted to [facility] in 08/2023 for acute CIDP flare and was started on IVIG along with Oxcarbazepine . Was also noted to have DT's during admission and started on Precedex  drip and Phenobarbital . Cardiology was consulted during admission as he did have episodes of SVT with heart rate in the 150's at times. This was in the setting of multiple electrolyte abnormalities as well. Was started on short-acting Cardizem  30 mg every 6 hours and this was consolidated to Cardizem  CD 120 mg daily. He was admitted to CIR at [facility] for further rehabilitation and was discharged on 08/26/2023. He did not follow-up with cardiology in the outpatient setting.  He was admitted again in 10/2023 for acute alcohol  withdrawal and associated delirium. Was also treated for aspiration pneumonia and an AKI during admission. Had a recurrent admission in 11/2023 for hyponatremia, subdural hematoma and hepatic encephalopathy. Most recent admission was from 9/8 - 04/18/2024 for alcohol  withdrawal and recurrent DTs. Required Precedex  and Lorazepam  during admission.   In talking with the patient today, he reports overall doing well since his recent hospitalization. He denies any recurrent alcohol  use and says he is determined to avoid this going forward. He has been trying to walk with his brother for exercise and stays busy around his home. He denies any recent chest pain, palpitations, dyspnea on exertion, orthopnea, PND or pitting edema. Has chronic nerve pain in the setting of CIDP. He is unsure of his exact  medications but by description, he has been taking Cardizem  CD 120 mg daily.  Studies Reviewed:   EKG: EKG is not ordered today.  Echocardiogram: 08/2023 IMPRESSIONS     1. Left ventricular ejection fraction, by estimation, is 60 to 65%. The  left ventricle has normal function. The left ventricle has no regional  wall motion abnormalities. There is mild left ventricular hypertrophy.  Left ventricular diastolic parameters  are consistent with Grade I diastolic dysfunction (impaired relaxation).   2. Right ventricular systolic function is normal. The right ventricular  size is normal.   3. The mitral valve is normal in structure. No evidence of mitral valve  regurgitation. No evidence of mitral stenosis.   4. The aortic valve is tricuspid. Aortic valve regurgitation is not  visualized. No aortic stenosis is present.    Physical Exam:   VS:  BP 108/72 (BP Location: Left Arm, Cuff Size: Normal)   Pulse 84   Ht 6' 1 (1.854 m)   Wt 183 lb 6.4 oz (83.2 kg)   SpO2 99%   BMI 24.20 kg/m    Wt Readings from Last 3 Encounters:  04/23/24 183 lb 6.4 oz (83.2 kg)  04/12/24 189 lb 2.5 oz (85.8 kg)  02/20/24 189 lb 3.2 oz (85.8 kg)     GEN: Well nourished, well developed male appearing in no acute distress NECK: No JVD; No carotid bruits CARDIAC: RRR, no murmurs, rubs, gallops RESPIRATORY:  Clear to auscultation without rales, wheezing or rhonchi  ABDOMEN: Appears non-distended. No obvious abdominal masses. EXTREMITIES: No clubbing or cyanosis. No pitting edema.  Distal pedal pulses are  2+ bilaterally.   Assessment and Plan:   1. SVT - This occurred during his prior admission in 08/2023 during which he was admitted for a CIDP flare and alcohol  withdrawal with associated DT's. He did have sinus tachycardia during prior admissions but no documented SVT. He denies any recent palpitations. - Will continue with Cardizem  CD 120 mg daily. He denies any recurrent alcohol  use over the past 2  weeks and continued cessation was advised.   2. CAD - Calcium  score was elevated at 158 in 11/2022. Previously recommended to start statin therapy but he declined and LDL was at 36 when checked in 08/2023. - He denies any recent anginal symptoms. Continue with risk factor modification.    3. CIDP - Followed by Neurology. Has upcoming follow-up in 06/2024.  4. Aortic Root Dilatation - This measured 41 mm by echocardiogram in 01/2023 but was normal in size by repeat imaging in 08/2023. He does have a previously scheduled CTA Aorta for 01/2025.   Signed, Connor CHRISTELLA Qua, PA-C

## 2024-04-23 NOTE — Patient Instructions (Signed)
 Medication Instructions:  Your physician recommends that you continue on your current medications as directed. Please refer to the Current Medication list given to you today.  *If you need a refill on your cardiac medications before your next appointment, please call your pharmacy*  Lab Work: NONE   If you have labs (blood work) drawn today and your tests are completely normal, you will receive your results only by: MyChart Message (if you have MyChart) OR A paper copy in the mail If you have any lab test that is abnormal or we need to change your treatment, we will call you to review the results.  Testing/Procedures: NONE   Follow-Up: At [facility], you and your health needs are our priority.  As part of our continuing mission to provide you with exceptional heart care, our providers are all part of one team.  This team includes your primary Cardiologist (physician) and Advanced Practice Providers or APPs (Physician Assistants and Nurse Practitioners) who all work together to provide you with the care you need, when you need it.  Your next appointment:   6 month(s)  Provider:   Vishnu Mallipeddi, MD or Laymon Qua, PA-C    We recommend signing up for the patient portal called MyChart.  Sign up information is provided on this After Visit Summary.  MyChart is used to connect with patients for Virtual Visits (Telemedicine).  Patients are able to view lab/test results, encounter notes, upcoming appointments, etc.  Non-urgent messages can be sent to your provider as well.   To learn more about what you can do with MyChart, go to [email or web address].   Other Instructions Thank you for choosing [facility]!

## 2024-04-24 ENCOUNTER — Other Ambulatory Visit: Payer: Self-pay | Admitting: Neurology

## 2024-05-03 ENCOUNTER — Other Ambulatory Visit: Payer: Self-pay | Admitting: Neurology

## 2024-05-12 ENCOUNTER — Ambulatory Visit (INDEPENDENT_AMBULATORY_CARE_PROVIDER_SITE_OTHER): Admitting: Gastroenterology

## 2024-05-12 ENCOUNTER — Encounter (INDEPENDENT_AMBULATORY_CARE_PROVIDER_SITE_OTHER): Payer: Self-pay | Admitting: Gastroenterology

## 2024-05-12 VITALS — BP 132/92 | HR 106 | Temp 97.1°F | Ht 72.0 in | Wt 200.4 lb

## 2024-05-12 DIAGNOSIS — I714 Abdominal aortic aneurysm, without rupture, unspecified: Secondary | ICD-10-CM | POA: Diagnosis not present

## 2024-05-12 DIAGNOSIS — Z1211 Encounter for screening for malignant neoplasm of colon: Secondary | ICD-10-CM | POA: Insufficient documentation

## 2024-05-12 DIAGNOSIS — R748 Abnormal levels of other serum enzymes: Secondary | ICD-10-CM

## 2024-05-12 DIAGNOSIS — D500 Iron deficiency anemia secondary to blood loss (chronic): Secondary | ICD-10-CM

## 2024-05-12 DIAGNOSIS — D509 Iron deficiency anemia, unspecified: Secondary | ICD-10-CM

## 2024-05-12 DIAGNOSIS — F1090 Alcohol use, unspecified, uncomplicated: Secondary | ICD-10-CM

## 2024-05-12 DIAGNOSIS — K7010 Alcoholic hepatitis without ascites: Secondary | ICD-10-CM | POA: Insufficient documentation

## 2024-05-12 NOTE — Patient Instructions (Signed)
 It was very nice to meet you today, as dicussed with will plan for the following :  1) Upper endoscopy and colonoscopy

## 2024-05-12 NOTE — Progress Notes (Signed)
 [name] , M.D. Gastroenterology & Hepatology [facility] [address] [place], [place] [address] Primary Care Physician: [name], MD [address] [place] [address]  Chief Complaint: Anemia, elevated liver enzymes   History of Present Illness: Connor Copeland is a 48 y.o. male Chronic inflammatory demyelinating neuropathy (CIPD) on IVIG, alcohol  use disorder with who presents for evaluation of Anemia and elevated liver enzymes   Patient has been following with hematology for anemia and is referred for evaluation of iron  deficiency.  Patient was last seen in our clinic  for elevated liver enzymes on 7/24 in setting of alcohol  use disorder..  At that time patient had withdrawal seizure after he stopped drinking alcohol  abruptly.  Patient had another hospitalization 04/2024 for DT's Currently patient reports he has been any alcohol  for past 1 month.  Denies any overt GI.  Denies any NSAID use but only takes hydrocodone  for arthritis The patient denies having any nausea, vomiting, fever, chills, hematochezia, melena, hematemesis, abdominal distention, abdominal pain, diarrhea, jaundice, pruritus or weight loss.  Last labs from 02/03/2024 AST 27 ALT 13 alk phos 115 T. bili 0.6 Ferritin 171 with iron  saturation borderline low (12) B12 folate 19 vitamin B12 512 Hemoglobin 12.2 MCV 91 platelet 410  Viral hep profile from 11/2023 hepatitis A nonimmune hepatitis B surface antigen core antibody negative hep C antibody negative HIV negative  Last ZHI:wnwz Last Colonoscopy:none  FHx: neg for any gastrointestinal/liver disease, no malignancies Social: Heavy alcohol  use and heavy severe smoking ( 5-6 glasses of hard liquor), quit both 1 months ago Surgical: no abdominal surgeries  Past Medical History: Past Medical History:  Diagnosis Date   CIDP (chronic inflammatory demyelinating polyneuropathy) (HCC)    ETOH abuse     Gout     Past Surgical History: Past Surgical History:  Procedure Laterality Date   FOOT SURGERY      Family History: Family History  Problem Relation Age of Onset   COPD Father     Social History: Social History   Tobacco Use  Smoking Status Former   Current packs/day: 1.00   Types: Cigarettes  Smokeless Tobacco Never   Social History   Substance and Sexual Activity  Alcohol  Use Not Currently   Alcohol /week: 42.0 standard drinks of alcohol    Types: 42 Standard drinks or equivalent per week   Comment: 5-6 glasses of bourbon daily last drank 7 weeks ago   Social History   Substance and Sexual Activity  Drug Use No    Allergies: Allergies  Allergen Reactions   Neurontin [Gabapentin] Swelling    Leg swelling    Medications: Current Outpatient Medications  Medication Sig Dispense Refill   diltiazem  (CARDIZEM  CD) 120 MG 24 hr capsule Take 1 capsule (120 mg total) by mouth daily. 90 capsule 3   Febuxostat  80 MG TABS Take 1 tablet by mouth daily.     folic acid  (FOLVITE ) 1 MG tablet Take 1 tablet (1 mg total) by mouth daily. 30 tablet 3   magnesium  oxide (MAG-OX) 400 (240 Mg) MG tablet Take 1 tablet (400 mg total) by mouth 2 (two) times daily. 60 tablet 0   Multiple Vitamin (MULTIVITAMIN WITH MINERALS) TABS tablet Take 1 tablet by mouth daily.     nortriptyline  (PAMELOR ) 25 MG capsule TAKE 2 CAPSULES (50 MG TOTAL) BY MOUTH AT BEDTIME. 60 capsule 0   pantoprazole  (PROTONIX ) 40 MG tablet Take 1 tablet (40 mg total) by mouth  daily. 30 tablet 1   potassium chloride  SA (KLOR-CON  M) 20 MEQ tablet Take 1 tablet (20 mEq total) by mouth daily. 30 tablet 0   thiamine  (VITAMIN B1) 100 MG tablet Take 1 tablet (100 mg total) by mouth daily. 30 tablet 3   DULoxetine  (CYMBALTA ) 60 MG capsule Take 2 capsules (120 mg total) by mouth daily. (Patient not taking: Reported on 05/12/2024) 60 capsule 0   Oxcarbazepine  (TRILEPTAL ) 300 MG tablet 300 milligrams daily and 600 mg nightly  (Patient not taking: Reported on 05/12/2024) 90 tablet 0   No current facility-administered medications for this visit.    Review of Systems: GENERAL: negative for malaise, night sweats HEENT: No changes in hearing or vision, no nose bleeds or other nasal problems. NECK: Negative for lumps, goiter, pain and significant neck swelling RESPIRATORY: Negative for cough, wheezing CARDIOVASCULAR: Negative for chest pain, leg swelling, palpitations, orthopnea GI: SEE HPI MUSCULOSKELETAL: Negative for joint pain or swelling, back pain, and muscle pain. SKIN: Negative for lesions, rash HEMATOLOGY Negative for prolonged bleeding, bruising easily, and swollen nodes. ENDOCRINE: Negative for cold or heat intolerance, polyuria, polydipsia and goiter. NEURO: negative for tremor, gait imbalance, syncope and seizures. The remainder of the review of systems is noncontributory.   Physical Exam: BP (!) 132/92 (BP Location: Left Arm, Patient Position: Sitting, Cuff Size: Normal)   Pulse (!) 106   Temp (!) 97.1 F (36.2 C) (Temporal)   Ht 6' (1.829 m)   Wt 200 lb 6.4 oz (90.9 kg)   BMI 27.18 kg/m  GENERAL: The patient is AO x3, in no acute distress. HEENT: Head is normocephalic and atraumatic. EOMI are intact. Mouth is well hydrated and without lesions. NECK: Supple. No masses LUNGS: Clear to auscultation. No presence of rhonchi/wheezing/rales. Adequate chest expansion HEART: RRR, normal s1 and s2. ABDOMEN: Soft, nontender, no guarding, no peritoneal signs, and nondistended. BS +. No masses.  Imaging/Labs: as above     Latest Ref Rng & Units 04/16/2024    5:58 AM 04/13/2024    5:33 AM 04/12/2024    8:09 PM  CBC  WBC 4.0 - 10.5 K/uL 5.4  9.6  12.8   Hemoglobin 13.0 - 17.0 g/dL 89.4  87.3  85.2   Hematocrit 39.0 - 52.0 % 31.3  35.8  41.8   Platelets 150 - 400 K/uL 85  159  217    Lab Results  Component Value Date   IRON  29 (L) 02/03/2024   TIBC 252 02/03/2024   FERRITIN 171 02/03/2024    I  personally reviewed and interpreted the available labs, imaging and endoscopic files.  Ultrasound 09/2023  IMPRESSION: 1. Echogenic liver parenchyma consistent with hepatic steatosis and hepatocellular disease. 2. Otherwise negative examination    Impression and Plan:  CHUKWUEMEKA ARTOLA is a 48 y.o. male Chronic inflammatory demyelinating neuropathy (CIPD) on IVIG with who presents for evaluation of Anemia and elevated liver enzymes   # Normocytic anemia  Patients anemia is multifactorial with component of mild iron  deficiency and anemia of chronic disease  No overt GI bleed.  Patient is overdue for age-appropriate colon cancer screening as well  As per ACG guidelines , bidirectional endoscopy is recommended over iron  replacement therapy only. We will proceed along with Upper endoscopy with small bowel biopsies and Colonoscopy   #Elevated liver enzymes  Previously patient had elevated liver enzymes in setting of alcohol  use disorder with AST more than ALT , last checked again elevated including  alp   I commended  patient regarding alcohol  cessation  Hepatitis A nonimmune hepatitis B non exposed and negative, hepatitis C negative   Will obtain complete abdominal ultrasound elastography to evaluate degree of fibrosis if any present Check hepatitis B surface antibody status and if negative would recommend hep B and A vaccination   #AAA-abdominal aortic  aneurysm  Ultrasound from 12/2022 demonstrating  Abdominal aortic aneurysm measuring 3.9 cm.  Recommend follow-up every 2 years.  Patient was made aware of this and he will follow-up with primary care for continued surveillance of this Reportedly patient had stopped smoking which is imperative given abdominal aortic  aneurysm    All questions were answered.      Arvis Zwahlen Faizan Corina Stacy, MD Gastroenterology and Hepatology [facility]enter[facility]   This chart has been completed using [facility]  Dictation software, and while attempts have been made to ensure accuracy , certain words and phrases may not be transcribed as intended

## 2024-05-13 ENCOUNTER — Telehealth: Payer: Self-pay | Admitting: *Deleted

## 2024-05-13 MED ORDER — PEG 3350-KCL-NA BICARB-NACL 420 G PO SOLR: 4000.0000 mL | Freq: Once | ORAL | 0 refills | Status: AC

## 2024-05-13 NOTE — Telephone Encounter (Signed)
 Called pt. Scheduled for TCS/EGD with Dr. Cinderella, any room on 11/17. Aware will send instructions to him and rx for prep to pharmacy.  Per [facility] Notification/Precertification Requirement This member's plan does not currently require notification or prior-authorization through the [employer] Notification or Prior-Authorization Program. Please contact a Customer Care Professional at [phone] if you believe the information returned to be in error.

## 2024-05-20 ENCOUNTER — Telehealth: Payer: Self-pay | Admitting: *Deleted

## 2024-05-20 NOTE — Telephone Encounter (Signed)
 Dr. Cinderella, pt has an order for abd us  and elastography in epic. Is this needed? Wasn't on encounter form and don't see mention on AVS.

## 2024-05-24 NOTE — Telephone Encounter (Signed)
 Yes its needed  Thanks .  Last note : Will obtain complete abdominal ultrasound elastography to evaluate degree of fibrosis if any present :

## 2024-05-24 NOTE — Telephone Encounter (Signed)
Called pt and he is aware of US appt details. He voiced understanding 

## 2024-05-28 ENCOUNTER — Other Ambulatory Visit: Payer: Self-pay | Admitting: Neurology

## 2024-05-28 ENCOUNTER — Ambulatory Visit (HOSPITAL_COMMUNITY)
Admission: RE | Admit: 2024-05-28 | Discharge: 2024-05-28 | Disposition: A | Discharge status: Home / Self Care | Source: Ambulatory Visit | Attending: Gastroenterology | Admitting: Gastroenterology

## 2024-05-28 DIAGNOSIS — R748 Abnormal levels of other serum enzymes: Secondary | ICD-10-CM | POA: Diagnosis present

## 2024-05-28 DIAGNOSIS — K7010 Alcoholic hepatitis without ascites: Secondary | ICD-10-CM | POA: Insufficient documentation

## 2024-05-31 NOTE — Telephone Encounter (Signed)
 Last seen on 08/04/23 per note   Nortriptyline  at night has helped some. Follow up scheduled on 06/07/24

## 2024-06-01 ENCOUNTER — Ambulatory Visit (INDEPENDENT_AMBULATORY_CARE_PROVIDER_SITE_OTHER): Payer: Self-pay | Admitting: Gastroenterology

## 2024-06-07 ENCOUNTER — Ambulatory Visit: Admitting: Neurology

## 2024-06-07 ENCOUNTER — Ambulatory Visit (INDEPENDENT_AMBULATORY_CARE_PROVIDER_SITE_OTHER): Admitting: Gastroenterology

## 2024-06-07 ENCOUNTER — Encounter: Payer: Self-pay | Admitting: Neurology

## 2024-06-07 VITALS — BP 130/86 | HR 90 | Ht 74.0 in | Wt 219.0 lb

## 2024-06-07 DIAGNOSIS — R269 Unspecified abnormalities of gait and mobility: Secondary | ICD-10-CM

## 2024-06-07 DIAGNOSIS — R208 Other disturbances of skin sensation: Secondary | ICD-10-CM

## 2024-06-07 DIAGNOSIS — G629 Polyneuropathy, unspecified: Secondary | ICD-10-CM | POA: Diagnosis not present

## 2024-06-07 DIAGNOSIS — G6181 Chronic inflammatory demyelinating polyneuritis: Secondary | ICD-10-CM

## 2024-06-07 DIAGNOSIS — Z79899 Other long term (current) drug therapy: Secondary | ICD-10-CM

## 2024-06-07 MED ORDER — OXCARBAZEPINE 300 MG PO TABS: ORAL_TABLET | ORAL | 11 refills | Status: AC

## 2024-06-07 MED ORDER — DULOXETINE HCL 60 MG PO CPEP: 120.0000 mg | ORAL_CAPSULE | Freq: Every day | ORAL | 11 refills | Status: DC

## 2024-06-07 NOTE — Progress Notes (Addendum)
 [facility]  PATIENT: Connor Copeland DOB: 1976-07-25  REFERRING DOCTOR OR PCP: Norleen Hurst, MD SOURCE: Patient, note from primary care, note from emergency room, laboratory results  _________________________________   HISTORICAL  CHIEF COMPLAINT:  Chief Complaint  Patient presents with   Follow-up    Rm10, alone, Pt is here for followup of CIDP (chronic inflammatory demyelinating polyneuropathy),  Seizure disorder, Dysesthesia,  Gait disturbance. Pt stated that he stopped diltiazem  2 months ago because it ran out. Last sz occurred 04/12/24, pt stated that with gait he has good and bad days, neuropathy is terrible in bilateral upper and lower extremities    HISTORY OF PRESENT ILLNESS:  Connor Copeland is a 48 y.o. man with CIDP.  Update  06/07/2024 He has been on IVIg since January 2025.   He tolerate sit well but has not noted much a benefit. Pain is better on oxcarbazepine  and cymbalta .    Preciously there ws no benefit from steroid.    His polyneuropathy is associated with numbness going up to the thighs and sometimes waist.   Arms are nub id upper arms down.  Due to the increased numbness and weakness, gait is much worse.  He has fallen several times.  He can no longer walk without a walker.  In dim light or if he closes his eyes, he is very unstable.   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    CSF was normal.   He did not get much benefit from prednisone  and we tapered off . Not much benefit from IVIG.  Besides the numbness, he continues to experience a lot of pain in his legs.    Gabapentin was not tolerated due to leg swelling.   Duloxetine  has not helped.   Lamotrigine  100 mg po bid is tolerated but did not help so he stopped.  Nortriptyline  at night has helped some.      In  September,  he had a seizure that occurred after he stopped drinking.    He is sober now x 3 month..  He had a seizure in June 2024.   EEG was normal.    He reports sleeping poorly due to pain with frequent awakenings.  Pain is a little better at night when he gets up and walks.  Bladder frequency is unchanged.  He has gout.  He has not taken colchicine recently.  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  Symptoms worsened further August through December 2024 despite being placed on prednisone .     He took gabapentin but reports his legs swelled up when he took it.  Therefore, he stopped.  He also was prescribed hydrocodone  bit feels it has not helped.     NCV/EMG 03/19/2023 This is an abnormal study.  There is electrodiagnostic evidence of chronic demyelinating polyradiculoneuropathy based on evidence of multiple mild to moderately prolonged F-wave latency, neuropathic changes at needle examinations.   Labs: 12/10/2022:  ESR, CRP, Vit D and B12 were normal   01/10/2023:   B12, HIV, CBC/D were normal.   Calcium  mildly reduced (but Albumin  also mildly reduced)   618/2024:  SPEP.IEF was normal.   IgG normal  04/08/2023:  CSF protein 38, WBC  4, RBC 0. Cultures neg, VDRL negative  IMAGING    REVIEW OF SYSTEMS: Constitutional: No fevers, chills, sweats, or change in appetite Eyes: No visual changes, double vision, eye pain Ear, nose and throat: No hearing loss, ear pain, nasal congestion, sore throat Cardiovascular: No chest pain, palpitations Respiratory:  No shortness of breath at rest or with exertion.   No wheezes GastrointestinaI: No nausea, vomiting, diarrhea, abdominal pain, fecal incontinence Genitourinary:  No dysuria, urinary retention or frequency.  No nocturia. Musculoskeletal: As above.  He has  gout. Integumentary: No rash, pruritus, skin lesions Neurological: as above Psychiatric: No depression at this time.  No anxiety Endocrine: No palpitations, diaphoresis, change in appetite, change in weigh or increased thirst Hematologic/Lymphatic:  No anemia, purpura, petechiae. Allergic/Immunologic: No itchy/runny eyes, nasal congestion, recent allergic reactions, rashes  ALLERGIES: Allergies  Allergen Reactions   Neurontin [Gabapentin] Swelling    Leg swelling    HOME MEDICATIONS:  Current Outpatient Medications:    Febuxostat  80 MG TABS, Take 1 tablet by mouth daily., Disp: , Rfl:    folic acid  (FOLVITE ) 1 MG tablet, Take 1 tablet (1 mg total) by mouth daily., Disp: 30 tablet, Rfl: 3   magnesium  oxide (MAG-OX) 400 (240 Mg) MG tablet, Take 1 tablet (400 mg total) by mouth 2 (two) times daily., Disp: 60 tablet, Rfl: 0   Multiple Vitamin (MULTIVITAMIN WITH MINERALS) TABS tablet, Take 1 tablet by mouth daily., Disp: , Rfl:    nortriptyline  (PAMELOR ) 25 MG capsule, TAKE 2 CAPSULES (50 MG TOTAL) BY MOUTH AT BEDTIME., Disp: 60 capsule, Rfl: 0   pantoprazole  (PROTONIX ) 40 MG tablet, Take 1 tablet (40 mg total) by mouth daily., Disp: 30 tablet, Rfl: 1   potassium chloride  SA (KLOR-CON  M) 20 MEQ tablet, Take 1 tablet (20 mEq total) by mouth daily., Disp: 30 tablet, Rfl: 0   thiamine  (VITAMIN B1) 100 MG tablet, Take 1 tablet (100 mg total) by mouth daily., Disp: 30 tablet, Rfl: 3   DULoxetine  (CYMBALTA ) 60 MG capsule, Take 2 capsules (120 mg total) by mouth daily., Disp: 60 capsule, Rfl: 11   Oxcarbazepine  (TRILEPTAL ) 300 MG tablet, 300 milligrams daily and 600 mg nightly, Disp: 90 tablet, Rfl: 11  PAST MEDICAL HISTORY: Past Medical History:  Diagnosis Date   CIDP (chronic inflammatory demyelinating polyneuropathy) (HCC)    ETOH abuse    Gout     PAST SURGICAL HISTORY: Past Surgical History:  Procedure Laterality Date   FOOT SURGERY      FAMILY HISTORY: Family History  Problem  Relation Age of Onset   COPD Father     SOCIAL HISTORY: Social History   Socioeconomic History   Marital status: Divorced    Spouse name: Not on file   Number of children: Not on file   Years of education: Not on file   Highest education level: Not on file  Occupational History   Not on file  Tobacco Use   Smoking status: Former    Current packs/day: 1.00    Types: Cigarettes   Smokeless tobacco: Never  Vaping Use   Vaping status: Never Used  Substance and Sexual Activity   Alcohol  use: Not Currently    Alcohol /week: 42.0 standard drinks of alcohol     Types: 42 Standard drinks or equivalent per week    Comment: 5-6 glasses of bourbon daily last drank 7 weeks ago   Drug use: No   Sexual activity: Not Currently  Other Topics Concern   Not on file  Social History Narrative   Right Handed   No Caffeine    Social Drivers of Corporate Investment Banker Strain: Not on file  Food Insecurity: Food Insecurity Present (04/13/2024)   Hunger Vital Sign    Worried About Running Out of Food in the Last Year: Sometimes true    Ran Out of Food in the Last Year: Sometimes true  Transportation Needs: No Transportation Needs (04/13/2024)   PRAPARE - Administrator, Civil Service (Medical): No    Lack of Transportation (Non-Medical): No  Physical Activity: Not on file  Stress: Not on file  Social Connections: Not on file  Intimate Partner Violence: Not At Risk (04/13/2024)   Humiliation, Afraid, Rape, and Kick questionnaire    Fear of Current or Ex-Partner: No    Emotionally Abused: No    Physically Abused: No    Sexually Abused: No       PHYSICAL EXAM  Vitals:   06/07/24 0818  BP: 130/86  Pulse: 90  Weight: 219 lb (99.3 kg)  Height: 6' 2 (1.88 m)    Body mass index is 28.12 kg/m.   General: The patient is well-developed and well-nourished and in no acute distress  HEENT:  Head is [place]/AT.  Sclera are anicteric.    Skin: Extremities are without rash or   edema.  Musculoskeletal: He has some tenderness over the muscles  Neurologic Exam  Mental status: The patient is alert and oriented x 3 at the time of the examination. The patient has apparent normal recent and remote memory, with an apparently normal attention span and concentration ability.   Speech is normal.  Cranial nerves: Extraocular movements are full.  There is good facial sensation to soft touch bilaterally.Facial strength is normal.  Trapezius and sternocleidomastoid strength is normal. No dysarthria is noted.   . No obvious hearing deficits are noted.  Motor:  Muscle bulk is normal.   Tone is normal. Strength is  4/5 in toe extension and ankle extension and 4-4+ intrinsic hand muscles.    Sensory: He has reduced sensation to pinprick and temperature from elbow to fingers (worse distally)    He also has reduced vibration sensation at the wrist and further reduced  vibration sensation in the fingers.  In the legs, pinprick sensation is reduced from mid thigh down, absent from below the knee to the foot.  Vibration sensation is absent in the feet and knees.  This has worsened since the last visit.  Coordination: Cerebellar testing reveals good finger-nose-finger and reduced heel-to-shin bilaterally.  Gait and station: He is unable to stand without unilateral support.  He is walking in the room but has unstable turn.    He cannot tandem walk.  Romberg is positive.   Reflexes: Deep tendon reflexes are symmetric and 1+ in arms but absent in knees and ankle.       DIAGNOSTIC DATA (LABS, IMAGING, TESTING) - I reviewed patient records, labs, notes, testing and imaging myself where available.  Lab Results  Component Value Date   WBC 5.4 04/16/2024   HGB 10.5 (L) 04/16/2024   HCT 31.3 (L) 04/16/2024   MCV 96.6 04/16/2024   PLT 85 (L) 04/16/2024      Component Value Date/Time   NA 127 (L) 04/16/2024 0558   K 3.6 04/16/2024 0558   CL 97 (L) 04/16/2024 0558   CO2 23 04/16/2024  0558   GLUCOSE 107 (H) 04/16/2024 0558   BUN 11 04/16/2024 0558   CREATININE  0.57 (L) 04/16/2024 0558   CALCIUM  7.2 (L) 04/16/2024 0558   PROT 5.9 (L) 04/13/2024 0533   PROT 5.4 (L) 01/21/2023 1614   ALBUMIN  1.9 (L) 04/14/2024 0327   AST 43 (H) 04/13/2024 0533   ALT 20 04/13/2024 0533   ALKPHOS 226 (H) 04/13/2024 0533   BILITOT 1.3 (H) 04/13/2024 0533   GFRNONAA >60 04/16/2024 0558    Lab Results  Component Value Date   VITAMINB12 510 02/03/2024   Lab Results  Component Value Date   TSH 0.911 08/08/2023       ASSESSMENT AND PLAN  CIDP (chronic inflammatory demyelinating polyneuropathy) (HCC) - Plan: CBC with Differential/Platelet, Comprehensive metabolic panel with GFR, TSH, QuantiFERON-TB Gold Plus, IgG, IgA, IgM, Hep B Surface Antigen, Hepatitis B Core AB, Total  Dysesthesia  Polyneuropathy  Gait disturbance  High risk medication use - Plan: CBC with Differential/Platelet, Comprehensive metabolic panel with GFR, TSH, QuantiFERON-TB Gold Plus, IgG, IgA, IgM, Hep B Surface Antigen, Hepatitis B Core AB, Total   He has CIDP and has progressed despite steroid and then IVIg.     We will change to [facility] -- if no benefit consider a second opinion at one of the medical centers Continue/renew oxcarbazepine  for neuropathic pain 300 mg po qAM and 600 mg po qPM and continue duloxetine /renew Due to ataxia and weakness from the CIDP, he is currently disabled.    RTC 6 months    Lucion Dilger A. Vear, MD, [facility] 06/07/2024, 9:03 AM Certified in Neurology, Clinical Neurophysiology, Sleep Medicine and Neuroimaging  [facility] [address] [place], [place] [address] [phone]

## 2024-06-09 LAB — COMPREHENSIVE METABOLIC PANEL WITH GFR
ALT: 13 IU/L (ref 0–44)
AST: 17 IU/L (ref 0–40)
Albumin: 3.9 g/dL — ABNORMAL LOW (ref 4.1–5.1)
Alkaline Phosphatase: 93 IU/L (ref 47–123)
BUN/Creatinine Ratio: 14 (ref 9–20)
BUN: 8 mg/dL (ref 6–24)
Bilirubin Total: 0.5 mg/dL (ref 0.0–1.2)
CO2: 23 mmol/L (ref 20–29)
Calcium: 9.6 mg/dL (ref 8.7–10.2)
Chloride: 99 mmol/L (ref 96–106)
Creatinine, Ser: 0.57 mg/dL — ABNORMAL LOW (ref 0.76–1.27)
Globulin, Total: 2.5 g/dL (ref 1.5–4.5)
Glucose: 100 mg/dL — ABNORMAL HIGH (ref 70–99)
Potassium: 4.4 mmol/L (ref 3.5–5.2)
Sodium: 136 mmol/L (ref 134–144)
Total Protein: 6.4 g/dL (ref 6.0–8.5)
eGFR: 119 mL/min/1.73 (ref >59)

## 2024-06-09 LAB — QUANTIFERON-TB GOLD PLUS
QuantiFERON Mitogen Value: >10 [IU]/mL
QuantiFERON Nil Value: 0.05 [IU]/mL
QuantiFERON TB1 Ag Value: 0.07 [IU]/mL
QuantiFERON TB2 Ag Value: 0.04 [IU]/mL
QuantiFERON-TB Gold Plus: NEGATIVE

## 2024-06-09 LAB — CBC WITH DIFFERENTIAL/PLATELET
Basophils Absolute: 0 x10E3/uL (ref 0.0–0.2)
Basos: 0 %
EOS (ABSOLUTE): 0 x10E3/uL (ref 0.0–0.4)
Eos: 0 %
Hematocrit: 37.4 % — ABNORMAL LOW (ref 37.5–51.0)
Hemoglobin: 12.2 g/dL — ABNORMAL LOW (ref 13.0–17.7)
Immature Grans (Abs): 0 x10E3/uL (ref 0.0–0.1)
Immature Granulocytes: 0 %
Lymphocytes Absolute: 3 x10E3/uL (ref 0.7–3.1)
Lymphs: 38 %
MCH: 31.1 pg (ref 26.6–33.0)
MCHC: 32.6 g/dL (ref 31.5–35.7)
MCV: 95 fL (ref 79–97)
Monocytes Absolute: 0.9 x10E3/uL (ref 0.1–0.9)
Monocytes: 11 %
Neutrophils Absolute: 4 x10E3/uL (ref 1.4–7.0)
Neutrophils: 51 %
Platelets: 440 x10E3/uL (ref 150–450)
RBC: 3.92 x10E6/uL — ABNORMAL LOW (ref 4.14–5.80)
RDW: 12.8 % (ref 11.6–15.4)
WBC: 8 x10E3/uL (ref 3.4–10.8)

## 2024-06-09 LAB — HEPATITIS B SURFACE ANTIGEN: Hepatitis B Surface Ag: NEGATIVE

## 2024-06-09 LAB — TSH: TSH: 4.28 u[IU]/mL (ref 0.450–4.500)

## 2024-06-09 LAB — IGG, IGA, IGM
IgA/Immunoglobulin A, Serum: 222 mg/dL (ref 90–386)
IgG (Immunoglobin G), Serum: 1211 mg/dL (ref 603–1613)
IgM (Immunoglobulin M), Srm: 69 mg/dL (ref 20–172)

## 2024-06-09 LAB — HEPATITIS B CORE ANTIBODY, TOTAL: Hep B Core Total Ab: NEGATIVE

## 2024-06-10 ENCOUNTER — Ambulatory Visit: Payer: Self-pay | Admitting: Neurology

## 2024-06-10 NOTE — Telephone Encounter (Signed)
 Faxed below form to [facility], received fax confirmation.

## 2024-06-14 ENCOUNTER — Telehealth: Payer: Self-pay | Admitting: *Deleted

## 2024-06-14 NOTE — Telephone Encounter (Signed)
 Sent request to PA team to help assist with PA.

## 2024-06-15 ENCOUNTER — Telehealth: Payer: Self-pay

## 2024-06-15 ENCOUNTER — Encounter: Payer: Self-pay | Admitting: Oncology

## 2024-06-15 ENCOUNTER — Other Ambulatory Visit (HOSPITAL_COMMUNITY): Payer: Self-pay

## 2024-06-15 DIAGNOSIS — Z79899 Other long term (current) drug therapy: Secondary | ICD-10-CM

## 2024-06-15 DIAGNOSIS — G6181 Chronic inflammatory demyelinating polyneuritis: Secondary | ICD-10-CM

## 2024-06-15 NOTE — Telephone Encounter (Signed)
 Took call from phone room/Jael and spoke with pt about results. Aware PA pending and once approved, we will let him know and My [facility].

## 2024-06-15 NOTE — Telephone Encounter (Signed)
 Pharmacy Patient Advocate Encounter  Received notification from [employer] that Prior Authorization for Vyvgart has been DENIED.  Full denial letter will be uploaded to the media tab. See denial reason below.   PA #/Case ID/Reference #: [id number]

## 2024-06-15 NOTE — Telephone Encounter (Signed)
 Dr. Vear- did you want them to appeal? They are asking he try/fail pyridostigmine first

## 2024-06-15 NOTE — Telephone Encounter (Signed)
 Pt has not read mychart about lab results yet. I call pt at [phone]. Mailbox full and unable to LVM.

## 2024-06-16 NOTE — Telephone Encounter (Signed)
 Received an email from [employer] w/ [facility] who states they received the referral for the [facility] Hytrulo PFS. She asked for OV and EMG for this. I have faxed the requested notes to her. Prior to faxing the notes, I called the patient and confirmed he is ok with using [facility] instead of [facility] which he was using for IVIG. Pt is ok with this. He thanked me for the call.

## 2024-06-21 ENCOUNTER — Encounter (HOSPITAL_COMMUNITY)
Admission: RE | Disposition: A | Discharge status: Home / Self Care | Payer: Self-pay | Source: Home / Self Care | Attending: Gastroenterology

## 2024-06-21 ENCOUNTER — Ambulatory Visit (HOSPITAL_COMMUNITY): Admitting: Anesthesiology

## 2024-06-21 ENCOUNTER — Encounter (HOSPITAL_COMMUNITY): Payer: Self-pay | Admitting: Gastroenterology

## 2024-06-21 ENCOUNTER — Other Ambulatory Visit: Payer: Self-pay

## 2024-06-21 ENCOUNTER — Ambulatory Visit (HOSPITAL_COMMUNITY)
Admission: RE | Admit: 2024-06-21 | Discharge: 2024-06-21 | Disposition: A | Discharge status: Home / Self Care | Attending: Gastroenterology | Admitting: Gastroenterology

## 2024-06-21 DIAGNOSIS — D509 Iron deficiency anemia, unspecified: Secondary | ICD-10-CM | POA: Insufficient documentation

## 2024-06-21 DIAGNOSIS — K2990 Gastroduodenitis, unspecified, without bleeding: Secondary | ICD-10-CM

## 2024-06-21 DIAGNOSIS — Z87891 Personal history of nicotine dependence: Secondary | ICD-10-CM | POA: Diagnosis not present

## 2024-06-21 DIAGNOSIS — K2970 Gastritis, unspecified, without bleeding: Secondary | ICD-10-CM | POA: Insufficient documentation

## 2024-06-21 DIAGNOSIS — D122 Benign neoplasm of ascending colon: Secondary | ICD-10-CM | POA: Insufficient documentation

## 2024-06-21 DIAGNOSIS — D123 Benign neoplasm of transverse colon: Secondary | ICD-10-CM | POA: Insufficient documentation

## 2024-06-21 DIAGNOSIS — K3189 Other diseases of stomach and duodenum: Secondary | ICD-10-CM | POA: Insufficient documentation

## 2024-06-21 DIAGNOSIS — K7460 Unspecified cirrhosis of liver: Secondary | ICD-10-CM | POA: Diagnosis not present

## 2024-06-21 DIAGNOSIS — K648 Other hemorrhoids: Secondary | ICD-10-CM

## 2024-06-21 DIAGNOSIS — E66813 Obesity, class 3: Secondary | ICD-10-CM | POA: Diagnosis not present

## 2024-06-21 DIAGNOSIS — K644 Residual hemorrhoidal skin tags: Secondary | ICD-10-CM | POA: Diagnosis not present

## 2024-06-21 DIAGNOSIS — G6181 Chronic inflammatory demyelinating polyneuritis: Secondary | ICD-10-CM | POA: Insufficient documentation

## 2024-06-21 DIAGNOSIS — D124 Benign neoplasm of descending colon: Secondary | ICD-10-CM | POA: Diagnosis not present

## 2024-06-21 DIAGNOSIS — Z6828 Body mass index (BMI) 28.0-28.9, adult: Secondary | ICD-10-CM | POA: Diagnosis not present

## 2024-06-21 DIAGNOSIS — K319 Disease of stomach and duodenum, unspecified: Secondary | ICD-10-CM

## 2024-06-21 DIAGNOSIS — D120 Benign neoplasm of cecum: Secondary | ICD-10-CM | POA: Diagnosis not present

## 2024-06-21 DIAGNOSIS — K5730 Diverticulosis of large intestine without perforation or abscess without bleeding: Secondary | ICD-10-CM | POA: Diagnosis not present

## 2024-06-21 HISTORY — PX: COLONOSCOPY: SHX5424

## 2024-06-21 HISTORY — PX: ESOPHAGOGASTRODUODENOSCOPY: SHX5428

## 2024-06-21 LAB — HM COLONOSCOPY

## 2024-06-21 SURGERY — COLONOSCOPY
Anesthesia: General

## 2024-06-21 MED ORDER — LACTATED RINGERS IV SOLN
INTRAVENOUS | Status: DC | PRN
Start: 2024-06-21 — End: 2024-06-21

## 2024-06-21 MED ORDER — KETAMINE HCL 50 MG/5ML IJ SOSY
PREFILLED_SYRINGE | INTRAMUSCULAR | Status: DC | PRN
  Administered 2024-06-21: 15 mg via INTRAVENOUS

## 2024-06-21 MED ORDER — LACTATED RINGERS IV SOLN: INTRAVENOUS | Status: DC

## 2024-06-21 MED ORDER — KETAMINE HCL 50 MG/5ML IJ SOSY
PREFILLED_SYRINGE | INTRAMUSCULAR | Status: AC
  Filled 2024-06-21: qty 5

## 2024-06-21 MED ORDER — PHENYLEPHRINE HCL (PRESSORS) 10 MG/ML IV SOLN
INTRAVENOUS | Status: DC | PRN
  Administered 2024-06-21: 80 ug via INTRAVENOUS

## 2024-06-21 MED ORDER — LIDOCAINE 2% (20 MG/ML) 5 ML SYRINGE
INTRAMUSCULAR | Status: DC | PRN
Start: 2024-06-21 — End: 2024-06-21
  Administered 2024-06-21: 80 mg via INTRAVENOUS

## 2024-06-21 MED ORDER — STERILE WATER FOR IRRIGATION IR SOLN
Status: DC | PRN
  Administered 2024-06-21 (×3): 60 mL

## 2024-06-21 MED ORDER — PROPOFOL 500 MG/50ML IV EMUL
INTRAVENOUS | Status: DC | PRN
  Administered 2024-06-21: 150 ug/kg/min via INTRAVENOUS

## 2024-06-21 MED ORDER — PROPOFOL 10 MG/ML IV BOLUS
INTRAVENOUS | Status: DC | PRN
Start: 2024-06-21 — End: 2024-06-21
  Administered 2024-06-21 (×4): 50 mg via INTRAVENOUS

## 2024-06-21 NOTE — Discharge Instructions (Signed)

## 2024-06-21 NOTE — Op Note (Signed)
 [facility] Patient Name: Connor Copeland Procedure Date: 06/21/2024 8:20 AM MRN: [id number] Date of Birth: May 18, 1976 Attending MD: Deatrice Dine , MD, [id number] CSN: [id number] Age: 48 Admit Type: Outpatient Procedure:                Upper GI endoscopy Indications:              Unexplained iron  deficiency anemia Providers:                Deatrice Dine, MD, Harlene Lips, Kristine L.                            Shirlean Balm, Technician Referring MD:              Medicines:                Monitored Anesthesia Care Complications:            No immediate complications. Estimated Blood Loss:     Estimated blood loss was minimal. Procedure:                Pre-Anesthesia Assessment:                           - Prior to the procedure, a History and Physical                            was performed, and patient medications and                            allergies were reviewed. The patient's tolerance of                            previous anesthesia was also reviewed. The risks                            and benefits of the procedure and the sedation                            options and risks were discussed with the patient.                            All questions were answered, and informed consent                            was obtained. Prior Anticoagulants: The patient has                            taken no anticoagulant or antiplatelet agents. ASA                            Grade Assessment: II - A patient with mild systemic                            disease. After reviewing the risks and benefits,  the patient was deemed in satisfactory condition to                            undergo the procedure.                           After obtaining informed consent, the endoscope was                            passed under direct vision. Throughout the                            procedure, the patient's blood pressure, pulse, and                             oxygen saturations were monitored continuously. The                            Endoscope was introduced through the mouth, and                            advanced to the second part of duodenum. The upper                            GI endoscopy was accomplished without difficulty.                            The patient tolerated the procedure well. Scope In: 8:37:54 AM Scope Out: 8:42:12 AM Total Procedure Duration: 0 hours 4 minutes 18 seconds  Findings:      The examined esophagus was normal.      Mild inflammation characterized by erythema was found in the gastric       body. Biopsies were taken with a cold forceps for histology.      The duodenal bulb and second portion of the duodenum were normal.       Biopsies were taken with a cold forceps for histology. Impression:               - Normal esophagus.                           - Gastritis. Biopsied.                           - Normal duodenal bulb and second portion of the                            duodenum. Biopsied. Moderate Sedation:      Per Anesthesia Care Recommendation:           - Patient has a contact number available for                            emergencies. The signs and symptoms of potential  delayed complications were discussed with the                            patient. Return to normal activities tomorrow.                            Written discharge instructions were provided to the                            patient.                           - Resume previous diet.                           - Continue present medications.                           - Await pathology results. Procedure Code(s):        --- Professional ---                           [phone], Esophagogastroduodenoscopy, flexible,                            transoral; with biopsy, single or multiple Diagnosis Code(s):        --- Professional ---                           K29.70, Gastritis, unspecified, without  bleeding                           D50.9, Iron  deficiency anemia, unspecified CPT copyright 2022 American Medical Association. All rights reserved. The codes documented in this report are preliminary and upon coder review may  be revised to meet current compliance requirements. Deatrice Dine, MD Deatrice Dine, MD 06/21/2024 8:45:25 AM This report has been signed electronically. Number of Addenda: 0

## 2024-06-21 NOTE — Anesthesia Postprocedure Evaluation (Signed)
 Anesthesia Post Note  Patient: Connor Copeland  Procedure(s) Performed: COLONOSCOPY EGD (ESOPHAGOGASTRODUODENOSCOPY)  Patient location during evaluation: Endoscopy Anesthesia Type: General Level of consciousness: awake and alert Pain management: pain level controlled Vital Signs Assessment: post-procedure vital signs reviewed and stable Respiratory status: spontaneous breathing, nonlabored ventilation and respiratory function stable Cardiovascular status: stable Anesthetic complications: no   There were no known notable events for this encounter.   Last Vitals:  Vitals:   06/21/24 0923 06/21/24 0925  BP: 97/72 109/62  Pulse: 84   Resp: 17 15  Temp: 36.5 C   SpO2: 98% 96%    Last Pain:  Vitals:   06/21/24 0923  TempSrc: Axillary  PainSc:                  Carlin LITTIE Kawasaki

## 2024-06-21 NOTE — Op Note (Signed)
 [facility] Patient Name: Connor Copeland Procedure Date: 06/21/2024 8:44 AM MRN: [id number] Date of Birth: April 19, 1976 Attending MD: Deatrice Dine , MD, [id number] CSN: [id number] Age: 48 Admit Type: Outpatient Procedure:                Colonoscopy Indications:              Unexplained iron  deficiency anemia Providers:                Deatrice Dine, MD, Ashley Goins, Kristine L. Shirlean Balm, Technician Referring MD:              Medicines:                Monitored Anesthesia Care Complications:            No immediate complications. Estimated Blood Loss:     Estimated blood loss was minimal. Procedure:                Pre-Anesthesia Assessment:                           - Prior to the procedure, a History and Physical                            was performed, and patient medications and                            allergies were reviewed. The patient's tolerance of                            previous anesthesia was also reviewed. The risks                            and benefits of the procedure and the sedation                            options and risks were discussed with the patient.                            All questions were answered, and informed consent                            was obtained. Prior Anticoagulants: The patient has                            taken no anticoagulant or antiplatelet agents. ASA                            Grade Assessment: II - A patient with mild systemic                            disease. After reviewing the risks and benefits,  the patient was deemed in satisfactory condition to                            undergo the procedure.                           - Prior to the procedure, a History and Physical                            was performed, and patient medications and                            allergies were reviewed. The patient's tolerance of                             previous anesthesia was also reviewed. The risks                            and benefits of the procedure and the sedation                            options and risks were discussed with the patient.                            All questions were answered, and informed consent                            was obtained. Prior Anticoagulants: The patient has                            taken no anticoagulant or antiplatelet agents. ASA                            Grade Assessment: II - A patient with mild systemic                            disease. After reviewing the risks and benefits,                            the patient was deemed in satisfactory condition to                            undergo the procedure.                           After obtaining informed consent, the colonoscope                            was passed under direct vision. Throughout the                            procedure, the patient's blood pressure, pulse, and  oxygen saturations were monitored continuously. The                            [id number] ([id number]) Colon was introduced through                            the anus and advanced to the the terminal ileum.                            The terminal ileum, ileocecal valve, appendiceal                            orifice, and rectum were photographed. The quality                            of the bowel preparation was evaluated using the                            BBPS [facility] Bowel Preparation Scale) with scores                            of: Right Colon = 2 (minor amount of residual                            staining, small fragments of stool and/or opaque                            liquid, but mucosa seen well), Transverse Colon = 2                            (minor amount of residual staining, small fragments                            of stool and/or opaque liquid, but mucosa seen                            well) and Left Colon = 2  (minor amount of residual                            staining, small fragments of stool and/or opaque                            liquid, but mucosa seen well). The total BBPS score                            equals 6. Scope In: 8:47:55 AM Scope Out: 9:17:29 AM Scope Withdrawal Time: 0 hours 25 minutes 5 seconds  Total Procedure Duration: 0 hours 29 minutes 34 seconds  Findings:      The perianal and digital rectal examinations were normal.      The terminal ileum appeared normal.      Four sessile polyps were found in the descending colon, transverse       colon, ascending colon and  cecum. The polyps were 4 to 7 mm in size.       These polyps were removed with a cold snare. Resection and retrieval       were complete.      Scattered small-mouthed diverticula were found in the entire colon.      Non-bleeding external hemorrhoids were found during retroflexion. The       hemorrhoids were small. Impression:               - The examined portion of the ileum was normal.                           - Four 4 to 7 mm polyps in the descending colon, in                            the transverse colon, in the ascending colon and in                            the cecum, removed with a cold snare. Resected and                            retrieved.                           - Diverticulosis in the entire examined colon.                           - Non-bleeding external hemorrhoids. Moderate Sedation:      Per Anesthesia Care Recommendation:           - Patient has a contact number available for                            emergencies. The signs and symptoms of potential                            delayed complications were discussed with the                            patient. Return to normal activities tomorrow.                            Written discharge instructions were provided to the                            patient.                           - Resume previous diet.                            - Continue present medications.                           - Await pathology results.                           -  Repeat colonoscopy in 3 - 5 years for                            surveillance based on pathology results.                           - Return to GI office as previously scheduled. Procedure Code(s):        --- Professional ---                           [phone], Colonoscopy, flexible; with removal of                            tumor(s), polyp(s), or other lesion(s) by snare                            technique Diagnosis Code(s):        --- Professional ---                           K64.4, Residual hemorrhoidal skin tags                           D12.4, Benign neoplasm of descending colon                           D12.3, Benign neoplasm of transverse colon (hepatic                            flexure or splenic flexure)                           D12.2, Benign neoplasm of ascending colon                           D12.0, Benign neoplasm of cecum                           D50.9, Iron  deficiency anemia, unspecified                           K57.30, Diverticulosis of large intestine without                            perforation or abscess without bleeding CPT copyright 2022 American Medical Association. All rights reserved. The codes documented in this report are preliminary and upon coder review may  be revised to meet current compliance requirements. Deatrice Dine, MD Deatrice Dine, MD 06/21/2024 9:26:09 AM This report has been signed electronically. Number of Addenda: 0

## 2024-06-21 NOTE — Anesthesia Preprocedure Evaluation (Signed)
 Anesthesia Evaluation  Patient identified by MRN, date of birth, ID band Patient awake    Reviewed: Allergy & Precautions, H&P , NPO status , Patient's Chart, lab work & pertinent test results, reviewed documented beta blocker date and time   Airway Mallampati: II  TM Distance: >3 FB Neck ROM: full    Dental no notable dental hx. (+) Dental Advisory Given, Teeth Intact   Pulmonary neg pulmonary ROS, former smoker   Pulmonary exam normal breath sounds clear to auscultation       Cardiovascular Exercise Tolerance: Good Normal cardiovascular exam+ dysrhythmias Supra Ventricular Tachycardia  Rhythm:regular Rate:Normal     Neuro/Psych Seizures -,  PSYCHIATRIC DISORDERS      Chronic inflammatory demyelenating neuropathy  Neuromuscular disease    GI/Hepatic negative GI ROS,,,(+) Cirrhosis     substance abuse  alcohol  use, Hepatitis -  Endo/Other  negative endocrine ROS    Renal/GU   negative genitourinary   Musculoskeletal   Abdominal   Peds  Hematology negative hematology ROS (+)   Anesthesia Other Findings   Reproductive/Obstetrics negative OB ROS                              Anesthesia Physical Anesthesia Plan  ASA: 3  Anesthesia Plan: General   Post-op Pain Management: Minimal or no pain anticipated   Induction: Intravenous  PONV Risk Score and Plan: Propofol infusion  Airway Management Planned: Natural Airway and Nasal Cannula  Additional Equipment: None  Intra-op Plan:   Post-operative Plan:   Informed Consent: I have reviewed the patients History and Physical, chart, labs and discussed the procedure including the risks, benefits and alternatives for the proposed anesthesia with the patient or authorized representative who has indicated his/her understanding and acceptance.     Dental Advisory Given  Plan Discussed with: CRNA  Anesthesia Plan Comments:          Anesthesia Quick Evaluation

## 2024-06-21 NOTE — Transfer of Care (Signed)
 Immediate Anesthesia Transfer of Care Note  Patient: Connor Copeland  Procedure(s) Performed: COLONOSCOPY EGD (ESOPHAGOGASTRODUODENOSCOPY)  Patient Location: PACU  Anesthesia Type:MAC  Level of Consciousness: awake and alert   Airway & Oxygen Therapy: Patient Spontanous Breathing and Patient connected to nasal cannula oxygen  Post-op Assessment: Report given to RN and Post -op Vital signs reviewed and stable  Post vital signs: Reviewed and stable  Last Vitals:  Vitals Value Taken Time  BP 109/62 06/21/24 09:25  Temp 36.5 C 06/21/24 09:23  Pulse 84 06/21/24 09:23  Resp 15 06/21/24 09:25  SpO2 96 % 06/21/24 09:25    Last Pain:  Vitals:   06/21/24 0923  TempSrc: Axillary  PainSc:       Patients Stated Pain Goal: 6 (06/21/24 0709)  Complications: No notable events documented.

## 2024-06-21 NOTE — Telephone Encounter (Signed)
 Can you clarify if this is this for infusion or home? I see there was another telephone encounter

## 2024-06-21 NOTE — H&P (Signed)
 Primary Care Physician:  [name], MD Primary Gastroenterologist:  Dr. Cinderella  Pre-Procedure History & Physical: HPI: Connor Copeland is a 48 y.o. 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  Patient was last seen in our clinic  for elevated liver enzymes on 7/24 in setting of alcohol  use disorder..  At that time patient had withdrawal seizure after he stopped drinking alcohol  abruptly.  Patient had another hospitalization 04/2024 for DT's Currently patient reports he has been any alcohol  for past 1 month.  Denies any overt GI.  Denies any NSAID use but only takes hydrocodone  for arthritis The patient denies having any nausea, vomiting, fever, chills, hematochezia, melena, hematemesis, abdominal distention, abdominal pain, diarrhea, jaundice, pruritus or weight loss.   Last labs from 02/03/2024 AST 27 ALT 13 alk phos 115 T. bili 0.6 Ferritin 171 with iron  saturation borderline low (12) B12 folate 19 vitamin B12 512 Hemoglobin 12.2 MCV 91 platelet 410   Viral hep profile from 11/2023 hepatitis A nonimmune hepatitis B surface antigen core antibody negative hep C antibody negative HIV negative   Last ZHI:wnwz Last Colonoscopy:none   FHx: neg for any gastrointestinal/liver disease, no malignancies Social: Heavy alcohol  use and heavy severe smoking ( 5-6 glasses of hard liquor), quit both 1 months ago Surgical: no abdominal surgeries    Past Medical History:  Diagnosis Date   CIDP (chronic inflammatory demyelinating polyneuropathy) (HCC)    ETOH abuse    Gout     Past Surgical History:  Procedure Laterality Date   FOOT SURGERY      Prior to Admission medications   Medication Sig Start Date End Date Taking? Authorizing Provider  DULoxetine  (CYMBALTA ) 60 MG capsule Take 2 capsules (120  mg total) by mouth daily. 06/07/24  Yes Sater, Charlie LABOR, MD  Febuxostat  80 MG TABS Take 1 tablet by mouth daily.   Yes [provider]  folic acid  (FOLVITE ) 1 MG tablet Take 1 tablet (1 mg total) by mouth daily. 04/18/24  Yes Ricky Fines, MD  magnesium  oxide (MAG-OX) 400 (240 Mg) MG tablet Take 1 tablet (400 mg total) by mouth 2 (two) times daily. 08/25/23  Yes Angiulli, Daniel J, PA-C  Multiple Vitamin (MULTIVITAMIN WITH MINERALS) TABS tablet Take 1 tablet by mouth daily. 08/21/23  Yes Angiulli, Toribio PARAS, PA-C  nortriptyline  (PAMELOR ) 25 MG capsule TAKE 2 CAPSULES (50 MG TOTAL) BY MOUTH AT BEDTIME. 05/31/24  Yes Sater, Charlie LABOR, MD  Oxcarbazepine  (TRILEPTAL ) 300 MG tablet 300 milligrams daily and 600 mg nightly 06/07/24  Yes Sater, Charlie LABOR, MD  pantoprazole  (PROTONIX ) 40 MG tablet Take 1 tablet (40 mg total) by mouth daily. 11/28/23 06/07/25 Yes Ricky Fines, MD  potassium chloride  SA (KLOR-CON  M) 20 MEQ tablet Take 1 tablet (20 mEq total) by mouth daily. 11/28/23  Yes Ricky Fines, MD  thiamine  (VITAMIN B1) 100 MG tablet Take 1 tablet (100 mg total) by mouth daily. 04/18/24  Yes Ricky Fines, MD    Allergies as of 05/13/2024 - Review Complete 05/12/2024  Allergen Reaction Noted   Neurontin [gabapentin] Swelling 01/10/2023    Family History  Problem Relation Age of Onset   COPD Father     Social History   Socioeconomic History   Marital status: Divorced    Spouse name: Not on file   Number of children:  Not on file   Years of education: Not on file   Highest education level: Not on file  Occupational History   Not on file  Tobacco Use   Smoking status: Former    Current packs/day: 1.00    Types: Cigarettes   Smokeless tobacco: Never  Vaping Use   Vaping status: Never Used  Substance and Sexual Activity   Alcohol  use: Not Currently    Alcohol /week: 42.0 standard drinks of alcohol     Types: 42 Standard drinks or equivalent per week    Comment: 5-6 glasses of  bourbon daily last drank 7 weeks ago   Drug use: No   Sexual activity: Not Currently  Other Topics Concern   Not on file  Social History Narrative   Right Handed   No Caffeine    Social Drivers of Health   Financial Resource Strain: Not on file  Food Insecurity: Food Insecurity Present (04/13/2024)   Hunger Vital Sign    Worried About Running Out of Food in the Last Year: Sometimes true    Ran Out of Food in the Last Year: Sometimes true  Transportation Needs: No Transportation Needs (04/13/2024)   PRAPARE - Administrator, Civil Service (Medical): No    Lack of Transportation (Non-Medical): No  Physical Activity: Not on file  Stress: Not on file  Social Connections: Not on file  Intimate Partner Violence: Not At Risk (04/13/2024)   Humiliation, Afraid, Rape, and Kick questionnaire    Fear of Current or Ex-Partner: No    Emotionally Abused: No    Physically Abused: No    Sexually Abused: No    Review of Systems: See HPI, otherwise negative ROS  Physical Exam: Vital signs in last 24 hours: Temp:  [98.1 F (36.7 C)] 98.1 F (36.7 C) (11/17 0710) Pulse Rate:  [92] 92 (11/17 0710) Resp:  [16] 16 (11/17 0710) BP: (120)/(81) 120/81 (11/17 0710) SpO2:  [98 %] 98 % (11/17 0710)   General:   Alert,  Well-developed, well-nourished, pleasant and cooperative in NAD Head:  Normocephalic and atraumatic. Eyes:  Sclera clear, no icterus.   Conjunctiva pink. Ears:  Normal auditory acuity. Nose:  No deformity, discharge,  or lesions. Msk:  Symmetrical without gross deformities. Normal posture. Extremities:  Without clubbing or edema. Neurologic:  Alert and  oriented x4;  grossly normal neurologically. Skin:  Intact without significant lesions or rashes. Psych:  Alert and cooperative. Normal mood and affect.  Impression/Plan: Connor Copeland is a 48 y.o. 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 Questions have been answered. All parties agreeable.

## 2024-06-22 ENCOUNTER — Encounter (INDEPENDENT_AMBULATORY_CARE_PROVIDER_SITE_OTHER): Payer: Self-pay | Admitting: *Deleted

## 2024-06-23 ENCOUNTER — Ambulatory Visit (INDEPENDENT_AMBULATORY_CARE_PROVIDER_SITE_OTHER): Payer: Self-pay | Admitting: Gastroenterology

## 2024-06-23 ENCOUNTER — Encounter (HOSPITAL_COMMUNITY): Payer: Self-pay | Admitting: Gastroenterology

## 2024-06-23 LAB — SURGICAL PATHOLOGY

## 2024-06-23 MED ORDER — EPINEPHRINE 0.3 MG/0.3ML IJ SOAJ: 0.3000 mg | INTRAMUSCULAR | 0 refills | Status: AC | PRN

## 2024-06-23 NOTE — Telephone Encounter (Signed)
 Dr. Vear- I pended EPI pen rx below. Please review, if it looks ok, please e-scribe. Thank you!  Called [facility] and spoke w/ Leita. Vyvgart SQ prefilled syringe requires that for 1st dose, pt have EPI pen available if needed. Asking MD call in prescription to [facility]. Aware I will send to MD.

## 2024-06-23 NOTE — Telephone Encounter (Signed)
 Rose From [employer]  Rx called stating that Pt got approved for Home Injection  , However Pt would need  a separate order be placed for Epipen   to [facility]   Phone :[phone] Fax: [phone]

## 2024-06-24 NOTE — Progress Notes (Signed)
 6 mth EGD noted in recall 3 yr TCS noted in recall Patient result letter mailed procedure note and pathology result faxed to PCP

## 2024-06-28 NOTE — Telephone Encounter (Signed)
 I have faxed this over to Natalie w/ [facility] [phone], to see if they are handling the appeal process. Received a receipt of confirmation.  Once we hear back, we will update.

## 2024-06-28 NOTE — Telephone Encounter (Signed)
 Renee from Pharmacy called stating that Pt medication was denied and wanted to know if MD will appeal   Callback number is [phone]

## 2024-07-05 NOTE — Telephone Encounter (Signed)
 Connor Copeland from [facility] [phone] is asking for a call back re: the Vyvgart syringe being non formulary, the two options that are formulary are the IV and vials sub que.  Connor Copeland said she has called several times on this, please call.

## 2024-07-06 NOTE — Telephone Encounter (Signed)
 [employer]   called stating that Pt medication was denied for PA , However Renee may have a way to go around  to Push Pt path forward . Renee would like to discuss with Nurse  about Pt Vyvgart  Callback number [phone]

## 2024-07-06 NOTE — Telephone Encounter (Signed)
 Natille from [facility] called requesting to speak to Nurse about Pt Medication  Callback# [phone]

## 2024-07-06 NOTE — Telephone Encounter (Signed)
 I called [facility] about the message checking on Vyvgart PFS for IVIG.  I could not get past verifying his address as the address was different.  I called pt but could not LM as VM full.  I called [facility], pt approved for quickstart program wo;; get up to 3 months (4 syringes per month) while awaiting for PA.  Received 06-29-2024. Approved for nursing auth (on file).  No date noted of when or if pt has taken first injection.

## 2024-07-06 NOTE — Telephone Encounter (Signed)
 I called and spoke to [facility], reimbursement specialist for vyvgart.  We spoke to what optum is needing.  She will work on her end about getting medication approved due since Dec 1 [employer] has removed the formulary exception, so are rerunning benefits.  I faxed to [employer] [phone] last OV and [place]/[facility].

## 2024-07-06 NOTE — Telephone Encounter (Signed)
 I called Connor Copeland from [employer], she said that denied due to nonformulary.  She said that can do vials (weekly by nurse) or IV infusion.  I relayed that I heard from manufacturer that as of Dec 1 that [employer] removed formulary exception.  She said they will resubmit PA and see what becomes of that.  Will be back in touch. Pt had first dose 07-02-2024  Vyvgart IVIG PFS.

## 2024-07-09 ENCOUNTER — Telehealth: Payer: Self-pay | Admitting: Neurology

## 2024-07-09 NOTE — Telephone Encounter (Signed)
 Jordan with [facility] called to request to speak Nurse , Jordan stated that He seen that medication has been changed from Grammkat to Vyvgart and stated that it looks like another company will be taking over medication . Jordan wanted to know if its okay to discharge Pt  from service   Callback number is  [phone]

## 2024-07-10 ENCOUNTER — Other Ambulatory Visit: Payer: Self-pay | Admitting: Neurology

## 2024-07-12 NOTE — Telephone Encounter (Signed)
 I called [facility].  Jordan not available.  Adam stated that received call from pt. That he is on vyvgart.  I relayed that yes that is true. He can be discharged from [facility] for IVIG. He took this verbal order.  Appreciated call back.

## 2024-07-26 NOTE — Telephone Encounter (Signed)
 On 07/06/2024 office note, [place]/[facility] sent to [phone] for vyvgart.

## 2024-08-02 ENCOUNTER — Encounter: Payer: Self-pay | Admitting: *Deleted

## 2024-08-05 ENCOUNTER — Encounter (HOSPITAL_COMMUNITY): Payer: Self-pay | Admitting: Emergency Medicine

## 2024-08-05 ENCOUNTER — Emergency Department (HOSPITAL_COMMUNITY)

## 2024-08-05 ENCOUNTER — Other Ambulatory Visit: Payer: Self-pay

## 2024-08-05 ENCOUNTER — Observation Stay (HOSPITAL_COMMUNITY)
Admission: EM | Admit: 2024-08-05 | Discharge: 2024-08-06 | Disposition: A | Discharge status: Home / Self Care | Attending: Internal Medicine | Admitting: Internal Medicine

## 2024-08-05 DIAGNOSIS — G629 Polyneuropathy, unspecified: Secondary | ICD-10-CM | POA: Insufficient documentation

## 2024-08-05 DIAGNOSIS — E8729 Other acidosis: Secondary | ICD-10-CM | POA: Insufficient documentation

## 2024-08-05 DIAGNOSIS — G6181 Chronic inflammatory demyelinating polyneuritis: Secondary | ICD-10-CM | POA: Diagnosis not present

## 2024-08-05 DIAGNOSIS — Z8739 Personal history of other diseases of the musculoskeletal system and connective tissue: Secondary | ICD-10-CM | POA: Insufficient documentation

## 2024-08-05 DIAGNOSIS — F10931 Alcohol use, unspecified with withdrawal delirium: Secondary | ICD-10-CM | POA: Diagnosis present

## 2024-08-05 DIAGNOSIS — Z8673 Personal history of transient ischemic attack (TIA), and cerebral infarction without residual deficits: Secondary | ICD-10-CM | POA: Insufficient documentation

## 2024-08-05 DIAGNOSIS — Z6836 Body mass index (BMI) 36.0-36.9, adult: Secondary | ICD-10-CM | POA: Insufficient documentation

## 2024-08-05 DIAGNOSIS — R7401 Elevation of levels of liver transaminase levels: Secondary | ICD-10-CM | POA: Insufficient documentation

## 2024-08-05 DIAGNOSIS — R5381 Other malaise: Secondary | ICD-10-CM | POA: Diagnosis not present

## 2024-08-05 DIAGNOSIS — R569 Unspecified convulsions: Secondary | ICD-10-CM | POA: Diagnosis not present

## 2024-08-05 DIAGNOSIS — E66812 Obesity, class 2: Secondary | ICD-10-CM | POA: Insufficient documentation

## 2024-08-05 DIAGNOSIS — F10939 Alcohol use, unspecified with withdrawal, unspecified: Secondary | ICD-10-CM

## 2024-08-05 DIAGNOSIS — Z87891 Personal history of nicotine dependence: Secondary | ICD-10-CM | POA: Insufficient documentation

## 2024-08-05 DIAGNOSIS — D696 Thrombocytopenia, unspecified: Secondary | ICD-10-CM | POA: Insufficient documentation

## 2024-08-05 DIAGNOSIS — M792 Neuralgia and neuritis, unspecified: Secondary | ICD-10-CM | POA: Diagnosis present

## 2024-08-05 DIAGNOSIS — G40909 Epilepsy, unspecified, not intractable, without status epilepticus: Secondary | ICD-10-CM | POA: Insufficient documentation

## 2024-08-05 DIAGNOSIS — E876 Hypokalemia: Secondary | ICD-10-CM | POA: Insufficient documentation

## 2024-08-05 DIAGNOSIS — R269 Unspecified abnormalities of gait and mobility: Secondary | ICD-10-CM | POA: Diagnosis not present

## 2024-08-05 LAB — COMPREHENSIVE METABOLIC PANEL WITH GFR
ALT: 128 U/L — ABNORMAL HIGH (ref 0–44)
AST: 186 U/L — ABNORMAL HIGH (ref 15–41)
Albumin: 4.5 g/dL (ref 3.5–5.0)
Alkaline Phosphatase: 273 U/L — ABNORMAL HIGH (ref 38–126)
Anion gap: 26 — ABNORMAL HIGH (ref 5–15)
BUN: 10 mg/dL (ref 6–20)
CO2: 20 mmol/L — ABNORMAL LOW (ref 22–32)
Calcium: 9.4 mg/dL (ref 8.9–10.3)
Chloride: 86 mmol/L — ABNORMAL LOW (ref 98–111)
Creatinine, Ser: 0.8 mg/dL (ref 0.61–1.24)
GFR, Estimated: >60 mL/min
Glucose, Bld: 135 mg/dL — ABNORMAL HIGH (ref 70–99)
Potassium: 2.9 mmol/L — ABNORMAL LOW (ref 3.5–5.1)
Sodium: 132 mmol/L — ABNORMAL LOW (ref 135–145)
Total Bilirubin: 2 mg/dL — ABNORMAL HIGH (ref 0.0–1.2)
Total Protein: 7 g/dL (ref 6.5–8.1)

## 2024-08-05 LAB — URINALYSIS, ROUTINE W REFLEX MICROSCOPIC
Bilirubin Urine: NEGATIVE
Glucose, UA: NEGATIVE mg/dL
Hgb urine dipstick: NEGATIVE
Ketones, ur: NEGATIVE mg/dL
Leukocytes,Ua: NEGATIVE
Nitrite: NEGATIVE
Protein, ur: NEGATIVE mg/dL
Specific Gravity, Urine: 1.016 (ref 1.005–1.030)
pH: 6 (ref 5.0–8.0)

## 2024-08-05 LAB — CBC
HCT: 45 % (ref 39.0–52.0)
Hemoglobin: 15.7 g/dL (ref 13.0–17.0)
MCH: 29.8 pg (ref 26.0–34.0)
MCHC: 34.9 g/dL (ref 30.0–36.0)
MCV: 85.6 fL (ref 80.0–100.0)
Platelets: 122 K/uL — ABNORMAL LOW (ref 150–400)
RBC: 5.26 MIL/uL (ref 4.22–5.81)
RDW: 14.6 % (ref 11.5–15.5)
WBC: 10.5 K/uL (ref 4.0–10.5)
nRBC: 0 % (ref 0.0–0.2)

## 2024-08-05 LAB — URINE DRUG SCREEN
Amphetamines: NEGATIVE
Barbiturates: NEGATIVE
Benzodiazepines: NEGATIVE
Cocaine: NEGATIVE
Fentanyl: NEGATIVE
Methadone Scn, Ur: NEGATIVE
Opiates: NEGATIVE
Tetrahydrocannabinol: POSITIVE — AB

## 2024-08-05 LAB — CBG MONITORING, ED: Glucose-Capillary: 152 mg/dL — ABNORMAL HIGH (ref 70–99)

## 2024-08-05 LAB — I-STAT CHEM 8, ED
BUN: 9 mg/dL (ref 6–20)
Calcium, Ion: 1 mmol/L — ABNORMAL LOW (ref 1.15–1.40)
Chloride: 87 mmol/L — ABNORMAL LOW (ref 98–111)
Creatinine, Ser: 0.8 mg/dL (ref 0.61–1.24)
Glucose, Bld: 136 mg/dL — ABNORMAL HIGH (ref 70–99)
HCT: 48 % (ref 39.0–52.0)
Hemoglobin: 16.3 g/dL (ref 13.0–17.0)
Potassium: 2.9 mmol/L — ABNORMAL LOW (ref 3.5–5.1)
Sodium: 129 mmol/L — ABNORMAL LOW (ref 135–145)
TCO2: 21 mmol/L — ABNORMAL LOW (ref 22–32)

## 2024-08-05 LAB — MAGNESIUM: Magnesium: 1.6 mg/dL — ABNORMAL LOW (ref 1.7–2.4)

## 2024-08-05 LAB — ETHANOL: Alcohol, Ethyl (B): <15 mg/dL

## 2024-08-05 LAB — PHOSPHORUS: Phosphorus: 3.3 mg/dL (ref 2.5–4.6)

## 2024-08-05 MED ORDER — ALBUTEROL SULFATE (2.5 MG/3ML) 0.083% IN NEBU: 2.5000 mg | INHALATION_SOLUTION | RESPIRATORY_TRACT | Status: DC | PRN

## 2024-08-05 MED ORDER — FOLIC ACID 1 MG PO TABS
1.0000 mg | ORAL_TABLET | Freq: Every day | ORAL | Status: DC
  Administered 2024-08-06: 1 mg via ORAL
  Filled 2024-08-05: qty 1

## 2024-08-05 MED ORDER — THIAMINE HCL 100 MG/ML IJ SOLN
500.0000 mg | INTRAVENOUS | Status: DC
  Administered 2024-08-05: 500 mg via INTRAVENOUS
  Filled 2024-08-05 (×2): qty 5

## 2024-08-05 MED ORDER — MORPHINE SULFATE (PF) 2 MG/ML IV SOLN
1.0000 mg | INTRAVENOUS | Status: DC | PRN
  Administered 2024-08-05 – 2024-08-06 (×3): 2 mg via INTRAVENOUS
  Filled 2024-08-05 (×3): qty 1

## 2024-08-05 MED ORDER — ADULT MULTIVITAMIN W/MINERALS CH
1.0000 | ORAL_TABLET | Freq: Every day | ORAL | Status: DC
  Administered 2024-08-06: 1 via ORAL
  Filled 2024-08-05: qty 1

## 2024-08-05 MED ORDER — OXCARBAZEPINE 300 MG PO TABS
600.0000 mg | ORAL_TABLET | ORAL | Status: AC
  Administered 2024-08-05: 600 mg via ORAL
  Filled 2024-08-05: qty 2

## 2024-08-05 MED ORDER — LACTATED RINGERS IV SOLN: INTRAVENOUS | Status: DC

## 2024-08-05 MED ORDER — LORAZEPAM 2 MG/ML IJ SOLN: 1.0000 mg | INTRAMUSCULAR | Status: DC | PRN

## 2024-08-05 MED ORDER — CHLORDIAZEPOXIDE HCL 5 MG PO CAPS
10.0000 mg | ORAL_CAPSULE | Freq: Four times a day (QID) | ORAL | Status: DC
  Administered 2024-08-05 – 2024-08-06 (×2): 10 mg via ORAL
  Filled 2024-08-05 (×2): qty 2

## 2024-08-05 MED ORDER — FEBUXOSTAT 40 MG PO TABS
80.0000 mg | ORAL_TABLET | Freq: Every day | ORAL | Status: DC
  Administered 2024-08-06: 80 mg via ORAL
  Filled 2024-08-05: qty 2

## 2024-08-05 MED ORDER — POTASSIUM CHLORIDE CRYS ER 20 MEQ PO TBCR
40.0000 meq | EXTENDED_RELEASE_TABLET | Freq: Once | ORAL | Status: AC
  Administered 2024-08-05: 40 meq via ORAL
  Filled 2024-08-05: qty 2

## 2024-08-05 MED ORDER — MAGNESIUM OXIDE -MG SUPPLEMENT 400 (240 MG) MG PO TABS
400.0000 mg | ORAL_TABLET | Freq: Two times a day (BID) | ORAL | Status: DC
  Administered 2024-08-05 – 2024-08-06 (×2): 400 mg via ORAL
  Filled 2024-08-05 (×2): qty 1

## 2024-08-05 MED ORDER — HYDRALAZINE HCL 20 MG/ML IJ SOLN: 10.0000 mg | INTRAMUSCULAR | Status: DC | PRN

## 2024-08-05 MED ORDER — POTASSIUM CHLORIDE CRYS ER 20 MEQ PO TBCR
20.0000 meq | EXTENDED_RELEASE_TABLET | Freq: Every day | ORAL | Status: DC
  Administered 2024-08-06: 20 meq via ORAL
  Filled 2024-08-05: qty 1

## 2024-08-05 MED ORDER — MAGNESIUM SULFATE 2 GM/50ML IV SOLN
2.0000 g | Freq: Once | INTRAVENOUS | Status: AC
  Administered 2024-08-05: 2 g via INTRAVENOUS
  Filled 2024-08-05: qty 50

## 2024-08-05 MED ORDER — ADULT MULTIVITAMIN W/MINERALS CH: 1.0000 | ORAL_TABLET | Freq: Every day | ORAL | Status: DC

## 2024-08-05 MED ORDER — OXCARBAZEPINE 300 MG PO TABS: 600.0000 mg | ORAL_TABLET | Freq: Every day | ORAL | Status: DC

## 2024-08-05 MED ORDER — ENSURE PLUS HIGH PROTEIN PO LIQD: 237.0000 mL | Freq: Two times a day (BID) | ORAL | Status: DC

## 2024-08-05 MED ORDER — POTASSIUM CHLORIDE CRYS ER 20 MEQ PO TBCR: 40.0000 meq | EXTENDED_RELEASE_TABLET | Freq: Four times a day (QID) | ORAL | Status: DC

## 2024-08-05 MED ORDER — LORAZEPAM 1 MG PO TABS
1.0000 mg | ORAL_TABLET | ORAL | Status: DC | PRN
  Administered 2024-08-05 – 2024-08-06 (×2): 1 mg via ORAL
  Filled 2024-08-05 (×2): qty 1

## 2024-08-05 MED ORDER — OXYCODONE HCL 5 MG PO TABS
5.0000 mg | ORAL_TABLET | Freq: Four times a day (QID) | ORAL | Status: DC | PRN
  Administered 2024-08-05 – 2024-08-06 (×2): 5 mg via ORAL
  Filled 2024-08-05 (×2): qty 1

## 2024-08-05 MED ORDER — PANTOPRAZOLE SODIUM 40 MG PO TBEC
40.0000 mg | DELAYED_RELEASE_TABLET | Freq: Every day | ORAL | Status: DC
  Administered 2024-08-06: 40 mg via ORAL
  Filled 2024-08-05: qty 1

## 2024-08-05 NOTE — ED Provider Notes (Signed)
 " Connor Copeland EMERGENCY DEPARTMENT AT [facility] Provider Note   CSN: [id number] Arrival date & time: 08/05/24  1807     Patient presents with: Seizures   Connor Copeland is a 49 y.o. male.  {Add pertinent medical, surgical, social history, OB history to YEP:67052} Patient has a history of alcohol  abuse.  Patient stopped drinking 2 days ago because he wanted to and had a seizure today.  Is a second seizure he has had in his life.  Last 1 was many years ago   Seizures      Prior to Admission medications  Medication Sig Start Date End Date Taking? Authorizing Provider  DULoxetine  (CYMBALTA ) 60 MG capsule Take 2 capsules (120 mg total) by mouth daily. 06/07/24   Sater, Charlie LABOR, MD  EPINEPHrine  0.3 mg/0.3 mL IJ SOAJ injection Inject 0.3 mg into the muscle as needed for anaphylaxis. 06/23/24   Sater, Charlie LABOR, MD  Febuxostat  80 MG TABS Take 1 tablet by mouth daily.    [provider]  folic acid  (FOLVITE ) 1 MG tablet Take 1 tablet (1 mg total) by mouth daily. 04/18/24   Ricky Fines, MD  magnesium  oxide (MAG-OX) 400 (240 Mg) MG tablet Take 1 tablet (400 mg total) by mouth 2 (two) times daily. 08/25/23   Angiulli, Daniel J, PA-C  Multiple Vitamin (MULTIVITAMIN WITH MINERALS) TABS tablet Take 1 tablet by mouth daily. 08/21/23   Angiulli, Toribio PARAS, PA-C  nortriptyline  (PAMELOR ) 25 MG capsule TAKE 2 CAPSULES (50 MG TOTAL) BY MOUTH AT BEDTIME. 07/12/24   Sater, Charlie LABOR, MD  Oxcarbazepine  (TRILEPTAL ) 300 MG tablet 300 milligrams daily and 600 mg nightly 06/07/24   Sater, Charlie LABOR, MD  pantoprazole  (PROTONIX ) 40 MG tablet Take 1 tablet (40 mg total) by mouth daily. 11/28/23 06/07/25  Ricky Fines, MD  potassium chloride  SA (KLOR-CON  M) 20 MEQ tablet Take 1 tablet (20 mEq total) by mouth daily. 11/28/23   Ricky Fines, MD  thiamine  (VITAMIN B1) 100 MG tablet Take 1 tablet (100 mg total) by mouth daily. 04/18/24   Ricky Fines, MD    Allergies: Neurontin [gabapentin]     Review of Systems  Neurological:  Positive for seizures.    Updated Vital Signs BP (!) 127/94   Pulse 96   Temp 98.3 F (36.8 C) (Oral)   Resp 17   Ht 6' 2 (1.88 m)   Wt 99.3 kg   SpO2 97%   BMI 28.12 kg/m   Physical Exam  (all labs ordered are listed, but only abnormal results are displayed) Labs Reviewed  COMPREHENSIVE METABOLIC PANEL WITH GFR - Abnormal; Notable for the following components:      Result Value   Sodium 132 (*)    Potassium 2.9 (*)    Chloride 86 (*)    CO2 20 (*)    Glucose, Bld 135 (*)    AST 186 (*)    ALT 128 (*)    Alkaline Phosphatase 273 (*)    Total Bilirubin 2.0 (*)    Anion gap 26 (*)    All other components within normal limits  CBC - Abnormal; Notable for the following components:   Platelets 122 (*)    All other components within normal limits  CBG MONITORING, ED - Abnormal; Notable for the following components:   Glucose-Capillary 152 (*)    All other components within normal limits  I-STAT CHEM 8, ED - Abnormal; Notable for the following components:   Sodium 129 (*)  Potassium 2.9 (*)    Chloride 87 (*)    Glucose, Bld 136 (*)    Calcium , Ion 1.00 (*)    TCO2 21 (*)    All other components within normal limits  ETHANOL  URINALYSIS, ROUTINE W REFLEX MICROSCOPIC  URINE DRUG SCREEN  MAGNESIUM   PHOSPHORUS    EKG: EKG Interpretation Date/Time:  Thursday August 05 2024 18:20:07 EST Ventricular Rate:  102 PR Interval:  162 QRS Duration:  108 QT Interval:  362 QTC Calculation: 472 R Axis:   227  Text Interpretation: Sinus tachycardia Markedly posterior QRS axis Borderline low voltage, extremity leads Confirmed by Suzette Pac [phone]) on 08/05/2024 8:24:22 PM  Radiology: CT Head Wo Contrast Result Date: 08/05/2024 EXAM: CT HEAD WITHOUT CONTRAST 08/05/2024 06:42:49 PM TECHNIQUE: CT of the head was performed without the administration of intravenous contrast. Automated exposure control, iterative reconstruction, and/or  weight based adjustment of the mA/kV was utilized to reduce the radiation dose to as low as reasonably achievable. COMPARISON: 04/12/2024 CLINICAL HISTORY: Head trauma, abnormal mental status (Age 32-64y) FINDINGS: BRAIN AND VENTRICLES: No acute hemorrhage. No evidence of acute infarct. No hydrocephalus. No extra-axial collection. No mass effect or midline shift. ORBITS: No acute abnormality. SINUSES: No acute abnormality. SOFT TISSUES AND SKULL: No acute soft tissue abnormality. No skull fracture. IMPRESSION: 1. No acute intracranial abnormality. Electronically signed by: Morgane Naveau MD 08/05/2024 06:55 PM EST RP Workstation: [id number]    {Document cardiac monitor, telemetry assessment procedure when appropriate:32947} Procedures   Medications Ordered in the ED  potassium chloride  SA (KLOR-CON  M) CR tablet 40 mEq (40 mEq Oral Given 08/05/24 1952)      {Click here for ABCD2, HEART and other calculators REFRESH Note before signing:1}                              Medical Decision Making Amount and/or Complexity of Data Reviewed Labs: ordered. Radiology: ordered.  Risk Prescription drug management. Decision regarding hospitalization.  Patient with alcohol  withdrawal seizures and hypoglycemia with elevated anion gap.  Patient will be admitted to medicine for ops  {Document critical care time when appropriate  Document review of labs and clinical decision tools ie CHADS2VASC2, etc  Document your independent review of radiology images and any outside records  Document your discussion with family members, caretakers and with consultants  Document social determinants of health affecting pt's care  Document your decision making why or why not admission, treatments were needed:32947:::1}   Final diagnoses:  Seizure [facility])    ED Discharge Orders     None        "

## 2024-08-05 NOTE — Plan of Care (Signed)

## 2024-08-05 NOTE — ED Triage Notes (Signed)
 Per ems pt coming from home report seizure lasting about 5 minutes witnessed by family. Ems report patient post ictal on arrival. Pt bit tongue during seizure. Patient states he is currently going through alcohol  withdrawals and last drank 2 or 3 days ago. Family states patient was on couch during seizure and never fell.

## 2024-08-05 NOTE — ED Notes (Signed)
 Patient transported to CT

## 2024-08-05 NOTE — H&P (Signed)
 "                                                                                                          TR[facility] H&P   Patient Demographics:    Connor Copeland, is a 49 y.o. male  MRN: [id number]   DOB - Dec 09, 1975  Admit Date - 08/05/2024  Outpatient Primary MD for the patient is [name], MD    Chief Complaint  Patient presents with   Seizures      HPI:    Connor Copeland  is a 49 y.o. male, with medical history significant of chronic inflammatory demyelinating polyneuropathy, alcohol  disorder seizure, gout, C PID. - Patient presents to ED secondary to witnessed seizures by family, patient reports he decided to quit drinking, last drink was 2 to 3 days ago, patient was noted to have seizures, lasting about 5 minutes, at home witnessed by his family, he had tongue biting with bleed from his tongue, no urinary or stool incontinence, patient with history of previous seizures earlier this year due to alcohol  withdrawal as well, no fall or head trauma per family report to ED. - In ED CT head with no acute findings, multiple lab derangements noting potassium of 2.9, anion gap of 26, elevated liver enzymes, sodium of 132, platelet of 1 22K, alcohol  level <15, [facility] hospitalist consulted to admit   Review of systems:     A full 10 point Review of Systems was done, except as stated above, all other Review of Systems were negative.   With Past History of the following :    Past Medical History:  Diagnosis Date   CIDP (chronic inflammatory demyelinating polyneuropathy) (HCC)    ETOH abuse    Gout       Past Surgical History:  Procedure Laterality Date   COLONOSCOPY N/A 06/21/2024   Procedure: COLONOSCOPY;  Surgeon: Cinderella Deatrice FALCON, MD;  Location: [facility];  Service: Endoscopy;  Laterality: N/A;  815am, asa 1-2   ESOPHAGOGASTRODUODENOSCOPY N/A 06/21/2024   Procedure: EGD (ESOPHAGOGASTRODUODENOSCOPY);  Surgeon: Cinderella Deatrice FALCON, MD;  Location: [facility];   Service: Endoscopy;  Laterality: N/A;   FOOT SURGERY        Social History:     Social History   Tobacco Use   Smoking status: Former    Current packs/day: 1.00    Types: Cigarettes   Smokeless tobacco: Never  Substance Use Topics   Alcohol  use: Not Currently    Alcohol /week: 42.0 standard drinks of alcohol     Types: 42 Standard drinks or equivalent per week    Comment: 5-6 glasses of bourbon daily last drank 7 weeks ago       Family History :     Family History  Problem Relation Age of Onset   COPD Father       Home Medications:   Prior to Admission medications  Medication Sig Start Date End Date Taking? Authorizing Provider  DULoxetine  (CYMBALTA ) 60 MG capsule Take 2 capsules (120 mg total) by mouth daily. 06/07/24  Sater, Charlie LABOR, MD  EPINEPHrine  0.3 mg/0.3 mL IJ SOAJ injection Inject 0.3 mg into the muscle as needed for anaphylaxis. 06/23/24   Sater, Charlie LABOR, MD  Febuxostat  80 MG TABS Take 1 tablet by mouth daily.    [provider]  folic acid  (FOLVITE ) 1 MG tablet Take 1 tablet (1 mg total) by mouth daily. 04/18/24   Ricky Fines, MD  magnesium  oxide (MAG-OX) 400 (240 Mg) MG tablet Take 1 tablet (400 mg total) by mouth 2 (two) times daily. 08/25/23   Angiulli, Daniel J, PA-C  Multiple Vitamin (MULTIVITAMIN WITH MINERALS) TABS tablet Take 1 tablet by mouth daily. 08/21/23   Angiulli, Toribio PARAS, PA-C  nortriptyline  (PAMELOR ) 25 MG capsule TAKE 2 CAPSULES (50 MG TOTAL) BY MOUTH AT BEDTIME. 07/12/24   Sater, Charlie LABOR, MD  Oxcarbazepine  (TRILEPTAL ) 300 MG tablet 300 milligrams daily and 600 mg nightly 06/07/24   Sater, Charlie LABOR, MD  pantoprazole  (PROTONIX ) 40 MG tablet Take 1 tablet (40 mg total) by mouth daily. 11/28/23 06/07/25  Ricky Fines, MD  potassium chloride  SA (KLOR-CON  M) 20 MEQ tablet Take 1 tablet (20 mEq total) by mouth daily. 11/28/23   Ricky Fines, MD  thiamine  (VITAMIN B1) 100 MG tablet Take 1 tablet (100 mg total) by mouth daily. 04/18/24    Ricky Fines, MD     Allergies:    Allergies[1]   Physical Exam:   Vitals  Blood pressure (!) 127/94, pulse 96, temperature 98.3 F (36.8 C), temperature source Oral, resp. rate 17, height 6' 2 (1.88 m), weight 99.3 kg, SpO2 97%.   1. General Frail, chronically appearing male, appears older than stated age, laying in bed in no apparent distress  2. Normal affect and insight, Not Suicidal or Homicidal, Awake Alert, Oriented X 3.  3. No F.N deficits, ALL C.Nerves Intact, strength grossly grossly intact  4. Ears and Eyes appear Normal, Conjunctivae clear, PERRLA. Moist Oral Mucosa.  He bit his tongue in multiple sites, please see pictures below  5. Supple Neck, No JVD, No cervical lymphadenopathy appriciated, No Carotid Bruits.  6. Symmetrical Chest wall movement, Good air movement bilaterally, CTAB.  7. RRR, No Gallops, Rubs or Murmurs, No Parasternal Heave.  8. Positive Bowel Sounds, Abdomen Soft, No tenderness, No organomegaly appriciated,No rebound -guarding or rigidity.  9.  No Cyanosis, Normal Skin Turgor, No Skin Rash or Bruise.  10.  Has some muscle wasting,  joints appear normal , no effusions, Normal ROM.     Data Review:    CBC Recent Labs  Lab 08/05/24 1820 08/05/24 1824  WBC 10.5  --   HGB 15.7 16.3  HCT 45.0 48.0  PLT 122*  --   MCV 85.6  --   MCH 29.8  --   MCHC 34.9  --   RDW 14.6  --    ------------------------------------------------------------------------------------------------------------------  Chemistries  Recent Labs  Lab 08/05/24 1820 08/05/24 1824  NA 132* 129*  K 2.9* 2.9*  CL 86* 87*  CO2 20*  --   GLUCOSE 135* 136*  BUN 10 9  CREATININE 0.80 0.80  CALCIUM  9.4  --   AST 186*  --   ALT 128*  --   ALKPHOS 273*  --   BILITOT 2.0*  --    ------------------------------------------------------------------------------------------------------------------ estimated creatinine clearance is 139.1 mL/min (by C-G formula  based on SCr of 0.8 mg/dL). ------------------------------------------------------------------------------------------------------------------ No results for input(s): TSH, T4TOTAL, T3FREE, THYROIDAB in the last 72 hours.  Invalid input(s): FREET3  Coagulation profile  No results for input(s): INR, PROTIME in the last 168 hours. ------------------------------------------------------------------------------------------------------------------- No results for input(s): DDIMER in the last 72 hours. -------------------------------------------------------------------------------------------------------------------  Cardiac Enzymes No results for input(s): CKMB, TROPONINI, MYOGLOBIN in the last 168 hours.  Invalid input(s): CK ------------------------------------------------------------------------------------------------------------------    Component Value Date/Time   BNP 107.0 (H) 12/12/2022 2040     ---------------------------------------------------------------------------------------------------------------  Urinalysis    Component Value Date/Time   COLORURINE YELLOW 08/05/2024 1954   APPEARANCEUR CLEAR 08/05/2024 1954   LABSPEC 1.016 08/05/2024 1954   PHURINE 6.0 08/05/2024 1954   GLUCOSEU NEGATIVE 08/05/2024 1954   HGBUR NEGATIVE 08/05/2024 1954   BILIRUBINUR NEGATIVE 08/05/2024 1954   KETONESUR NEGATIVE 08/05/2024 1954   PROTEINUR NEGATIVE 08/05/2024 1954   NITRITE NEGATIVE 08/05/2024 1954   LEUKOCYTESUR NEGATIVE 08/05/2024 1954    ----------------------------------------------------------------------------------------------------------------   Imaging Results:    CT Head Wo Contrast Result Date: 08/05/2024 EXAM: CT HEAD WITHOUT CONTRAST 08/05/2024 06:42:49 PM TECHNIQUE: CT of the head was performed without the administration of intravenous contrast. Automated exposure control, iterative reconstruction, and/or weight based adjustment of the mA/kV  was utilized to reduce the radiation dose to as low as reasonably achievable. COMPARISON: 04/12/2024 CLINICAL HISTORY: Head trauma, abnormal mental status (Age 18-64y) FINDINGS: BRAIN AND VENTRICLES: No acute hemorrhage. No evidence of acute infarct. No hydrocephalus. No extra-axial collection. No mass effect or midline shift. ORBITS: No acute abnormality. SINUSES: No acute abnormality. SOFT TISSUES AND SKULL: No acute soft tissue abnormality. No skull fracture. IMPRESSION: 1. No acute intracranial abnormality. Electronically signed by: Morgane Naveau MD 08/05/2024 06:55 PM EST RP Workstation: [id number]      Assessment & Plan:    Principal Problem:   Alcohol  withdrawal seizure (HCC) Active Problems:   Chronic inflammatory demyelinating neuropathy (HCC)   Seizure disorder (HCC)   Neuropathic pain   High anion gap metabolic acidosis   Transaminitis    Alcohol  withdrawal seizures -Patient with known history of the same. - CT head with no acute findings. - No indication to start AED given it is seizure related -Continue seizure precaution.  Alcohol  abuse with high risk for severe withdrawals and DTs -Given he presents with alcohol  withdrawal seizures he was started on CIWA protocol.  History of severe alcohol  withdrawals in the past. - Will start on high-dose IV thiamine , continue with multivitamin and folic acid . - Will start on 10 mg of oral Librium  4 times daily, and wean slowly and gradually in the setting of known seizures, if he is stable for discharge in a.m. he will be discharged on Librium  taper as has been done in the past  Hyponatremia -Due to alcohol  abuse, continue with IV fluids   Hypokalemia  - replaced, will check magnesium  and phosphorus as well and replace if needed .  Transaminitis  - Due to alcohol  abuse  - Will hold on Tylenol  . - Will check ammonia level and INR given history of hyperammonemia.  History of subdural hematoma  - No acute finding on CT  head. - Keep on SCD for DVT prophylaxis for now and avoid subcu heparin , especially with history of thrombocytopenia   Hypomagnesemia/hypokalemia - Electrolytes has been repleted and maintenance supplementation started.   Transaminitis - In the setting of dehydration and alcohol  abuse - Cessation counseling provided - patient advise to maintain adequate hydration. - Continue to follow LFTs intermittently. - Minimize the use of hepatotoxic agents. -outpatient follow up with gastroenterology service has been advised.   Obesity, Class II, BMI 35-39.9 -Body mass index is 36.2  kg/m. - Low-calorie diet and portion control discussed with patient.   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  Code Status full code  Likely DC to home  Consults called: None  Admission status: Observation  Time spent in minutes : 70 minutes   Brayton Lye M.D on 08/05/2024 at 8:57 PM   [facility] - Office  [phone]        [1]  Allergies Allergen Reactions   Neurontin [Gabapentin] Swelling    Leg swelling   "

## 2024-08-06 DIAGNOSIS — F10939 Alcohol use, unspecified with withdrawal, unspecified: Secondary | ICD-10-CM | POA: Diagnosis not present

## 2024-08-06 DIAGNOSIS — R569 Unspecified convulsions: Secondary | ICD-10-CM | POA: Diagnosis not present

## 2024-08-06 LAB — CBC
HCT: 42.3 % (ref 39.0–52.0)
Hemoglobin: 15 g/dL (ref 13.0–17.0)
MCH: 30.4 pg (ref 26.0–34.0)
MCHC: 35.5 g/dL (ref 30.0–36.0)
MCV: 85.6 fL (ref 80.0–100.0)
Platelets: 91 K/uL — ABNORMAL LOW (ref 150–400)
RBC: 4.94 MIL/uL (ref 4.22–5.81)
RDW: 15 % (ref 11.5–15.5)
WBC: 7.6 K/uL (ref 4.0–10.5)
nRBC: 0 % (ref 0.0–0.2)

## 2024-08-06 LAB — COMPREHENSIVE METABOLIC PANEL WITH GFR
ALT: 117 U/L — ABNORMAL HIGH (ref 0–44)
AST: 185 U/L — ABNORMAL HIGH (ref 15–41)
Albumin: 4 g/dL (ref 3.5–5.0)
Alkaline Phosphatase: 224 U/L — ABNORMAL HIGH (ref 38–126)
Anion gap: 11 (ref 5–15)
BUN: 8 mg/dL (ref 6–20)
CO2: 29 mmol/L (ref 22–32)
Calcium: 8.7 mg/dL — ABNORMAL LOW (ref 8.9–10.3)
Chloride: 93 mmol/L — ABNORMAL LOW (ref 98–111)
Creatinine, Ser: 0.54 mg/dL — ABNORMAL LOW (ref 0.61–1.24)
GFR, Estimated: >60 mL/min
Glucose, Bld: 75 mg/dL (ref 70–99)
Potassium: 3 mmol/L — ABNORMAL LOW (ref 3.5–5.1)
Sodium: 133 mmol/L — ABNORMAL LOW (ref 135–145)
Total Bilirubin: 1.7 mg/dL — ABNORMAL HIGH (ref 0.0–1.2)
Total Protein: 5.9 g/dL — ABNORMAL LOW (ref 6.5–8.1)

## 2024-08-06 LAB — PROTIME-INR
INR: 1.1 (ref 0.8–1.2)
Prothrombin Time: 14.5 s (ref 11.4–15.2)

## 2024-08-06 LAB — MAGNESIUM: Magnesium: 2.2 mg/dL (ref 1.7–2.4)

## 2024-08-06 LAB — AMMONIA: Ammonia: 37 umol/L — ABNORMAL HIGH (ref 9–35)

## 2024-08-06 MED ORDER — OXCARBAZEPINE 300 MG PO TABS
300.0000 mg | ORAL_TABLET | Freq: Every day | ORAL | Status: DC
  Administered 2024-08-06: 300 mg via ORAL
  Filled 2024-08-06: qty 1

## 2024-08-06 MED ORDER — DULOXETINE HCL 60 MG PO CPEP: 60.0000 mg | ORAL_CAPSULE | Freq: Every day | ORAL | Status: AC

## 2024-08-06 MED ORDER — OXCARBAZEPINE 300 MG PO TABS: 600.0000 mg | ORAL_TABLET | Freq: Every day | ORAL | Status: DC

## 2024-08-06 NOTE — Evaluation (Signed)
 Physical Therapy Evaluation Patient Details Name: Connor Copeland MRN: [id number] DOB: Dec 20, 1975 Today's Date: 08/06/2024  History of Present Illness  Connor Copeland  is a 49 y.o. male, with medical history significant of chronic inflammatory demyelinating polyneuropathy, alcohol  disorder seizure, gout, C PID.  - Patient presents to ED secondary to witnessed seizures by family, patient reports he decided to quit drinking, last drink was 2 to 3 days ago, patient was noted to have seizures, lasting about 5 minutes, at home witnessed by his family, he had tongue biting with bleed from his tongue, no urinary or stool incontinence, patient with history of previous seizures earlier this year due to alcohol  withdrawal as well, no fall or head trauma per family report to ED.   Clinical Impression  Patient functioning near baseline for functional mobility and gait other than limited for ambulation due to c/o soreness in feet and declined to attempt walking out of room. Patient demonstrates good return for bed mobility and using RW to take a few steps at bedside and transferring to chair. Patient tolerated sitting up in chair after therapy. PLAN:  Patient to be discharged home today and discharged from acute physical therapy to care of nursing for ambulation as tolerated for length of stay with recommendations stated below           If plan is discharge home, recommend the following: A little help with walking and/or transfers;A little help with bathing/dressing/bathroom;Help with stairs or ramp for entrance;Assist for transportation;Assistance with cooking/housework   Can travel by private vehicle        Equipment Recommendations None recommended by PT  Recommendations for Other Services       Functional Status Assessment Patient has had a recent decline in their functional status and demonstrates the ability to make significant improvements in function in a reasonable and predictable  amount of time.     Precautions / Restrictions Precautions Precautions: Fall Recall of Precautions/Restrictions: Intact Restrictions Weight Bearing Restrictions Per Provider Order: No      Mobility  Bed Mobility Overal bed mobility: Modified Independent                  Transfers Overall transfer level: Modified independent                      Ambulation/Gait Ambulation/Gait assistance: Modified independent (Device/Increase time), Supervision Gait Distance (Feet): 4 Feet Assistive device: Rolling walker (2 wheels) Gait Pattern/deviations: Decreased step length - right, Decreased step length - left, Decreased stance time - right, Decreased stride length Gait velocity: slow     General Gait Details: limited to a few steps at bedside due to c/o fatigue and sorness in feet  Stairs            Wheelchair Mobility     Tilt Bed    Modified Rankin (Stroke Patients Only)       Balance Overall balance assessment: Needs assistance Sitting-balance support: Feet supported, No upper extremity supported Sitting balance-Leahy Scale: Good Sitting balance - Comments: seated at EOB   Standing balance support: During functional activity, No upper extremity supported Standing balance-Leahy Scale: Fair Standing balance comment: fair/good using RW                             Pertinent Vitals/Pain Pain Assessment Pain Assessment: Faces Faces Pain Scale: Hurts a little bit Pain Location: feet, hands, arms, worse in feet per patient Pain  Descriptors / Indicators: Discomfort, Sore Pain Intervention(s): Limited activity within patient's tolerance, Monitored during session    Home Living Family/patient expects to be discharged to:: Private residence Living Arrangements: Alone Available Help at Discharge: Family;Available PRN/intermittently Type of Home: House Home Access: Stairs to enter Entrance Stairs-Rails: Right Entrance Stairs-Number of  Steps: 2 in front, 6 at back   Home Layout: One level;Laundry or work area in [employer] Equipment: [place] - single point;Grab bars - Gaffer (4 wheels)      Prior Function Prior Level of Function : Independent/Modified Independent             Mobility Comments: Community ambulation without AD ADLs Comments: Independent     Extremity/Trunk Assessment   Upper Extremity Assessment Upper Extremity Assessment: Overall WFL for tasks assessed    Lower Extremity Assessment Lower Extremity Assessment: Generalized weakness    Cervical / Trunk Assessment Cervical / Trunk Assessment: Normal  Communication   Communication Communication: No apparent difficulties    Cognition Arousal: Alert Behavior During Therapy: WFL for tasks assessed/performed                             Following commands: Intact       Cueing Cueing Techniques: Verbal cues     General Comments      Exercises     Assessment/Plan    PT Assessment All further PT needs can be met in the next venue of care  PT Problem List Decreased strength;Decreased activity tolerance;Decreased balance;Decreased mobility       PT Treatment Interventions      PT Goals (Current goals can be found in the Care Plan section)  Acute Rehab PT Goals Patient Stated Goal: return home PT Goal Formulation: With patient Time For Goal Achievement: 08/06/24 Potential to Achieve Goals: Good    Frequency       Co-evaluation               AM-PAC PT 6 Clicks Mobility  Outcome Measure Help needed turning from your back to your side while in a flat bed without using bedrails?: None Help needed moving from lying on your back to sitting on the side of a flat bed without using bedrails?: None Help needed moving to and from a bed to a chair (including a wheelchair)?: None Help needed standing up from a chair using your arms (e.g., wheelchair or bedside chair)?: None Help  needed to walk in hospital room?: A Little Help needed climbing 3-5 steps with a railing? : A Lot 6 Click Score: 21    End of Session   Activity Tolerance: Patient tolerated treatment well;Patient limited by fatigue;Patient limited by pain Patient left: in chair;with call bell/phone within reach Nurse Communication: Mobility status PT Visit Diagnosis: Unsteadiness on feet (R26.81);Other abnormalities of gait and mobility (R26.89);Muscle weakness (generalized) (M62.81)    Time: 0950-1009 PT Time Calculation (min) (ACUTE ONLY): 19 min   Charges:   PT Evaluation $PT Eval Low Complexity: 1 Low PT Treatments $Therapeutic Activity: 8-22 mins PT General Charges $$ ACUTE PT VISIT: 1 Visit         1:44 PM, 08/06/2024 Lynwood Music, MPT Physical Therapist with [facility] [phone] office [phone] mobile phone

## 2024-08-06 NOTE — Discharge Instructions (Signed)
 SABRA

## 2024-08-06 NOTE — Progress Notes (Addendum)
 Patient admitted in OBS, Lives alone. Assessed and SA resources added previous admissions. SA resources added again. Patient's insurance will not cover home health services as explained in the past.  Planning to discharge home.    08/06/24 1156  TOC Brief Assessment  Insurance and Status Reviewed  Patient has primary care physician Yes  Home environment has been reviewed Home alone  Prior level of function: independent  Prior/Current Home Services No current home services  Social Drivers of Health Review SDOH reviewed no interventions necessary  Readmission risk has been reviewed Yes  Transition of care needs no transition of care needs at this time   Inpatient Care Manager (ICM) has reviewed patient and no ICM needs have been identified at this time. We will continue to monitor patient advancement through interdisciplinary progression rounds. If new patient transition needs arise, please place a ICM consult.

## 2024-08-06 NOTE — Discharge Summary (Signed)
 " Physician Discharge Summary   Patient: Connor Copeland MRN: [id number] DOB: [birth year]  Admit date:     08/05/2024  Discharge date: 08/06/2024  Discharge Physician: Deliliah Room   PCP: [name], MD   Recommendations at discharge:    F/u with your PCP in one week Stop drinking alcohol   Discharge Diagnoses: Principal Problem:   Alcohol  withdrawal seizure [facility]) Active Problems:   Chronic inflammatory demyelinating neuropathy (HCC)   Seizure disorder (HCC)   Neuropathic pain   High anion gap metabolic acidosis   Transaminitis    Hospital Course:   49 y.o. male, with medical history significant of chronic inflammatory demyelinating polyneuropathy, alcohol  disorder seizure, gout, C PID. - Patient presents to ED secondary to witnessed seizures by family, patient reports he decided to quit drinking, last drink was 2 to 3 days ago, patient was noted to have seizures, lasting about 5 minutes, at home witnessed by his family, he had tongue biting with bleed from his tongue, no urinary or stool incontinence, patient with history of previous seizures earlier this year due to alcohol  withdrawal as well, no fall or head trauma per family report to ED.  Alcohol  withdrawal seizures in the setting of alcohol  abuse,POA: -Patient with known history of the same. - CT head with no acute findings. - No indication to start AED given it is alcohol  related -No seizures in the hospital -No indication for librium  taper as an outpatient. -Continue with folate and thiamine  supplementation -Given outpatient resources information to help with alcoholism. -AAO x 3 at the time of the discharge.   Hyponatremia -Due to alcohol  abuse  History of subdural hematoma  - No acute finding on CT head.    Hypomagnesemia/hypokalemia - Repleted  Transaminitis - In the setting of dehydration and alcohol  abuse - Cessation counseling provided - patient advise to maintain adequate hydration. 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, I reviewing his most recent note 06/07/2024, it appears to be progressive despite steroids and IVIG treatment, continue with oxcarbazepine  for neuropathic pain, and duloxetine  On Vyvgart      Consultants: None Procedures performed: None  Disposition: Home Diet recommendation:  Cardiac diet DISCHARGE MEDICATION: Allergies as of 08/06/2024       Reactions   Neurontin [gabapentin] Swelling   Leg swelling        Medication List     TAKE these medications    DULoxetine  60 MG capsule Commonly known as: CYMBALTA  Take 1 capsule (60 mg total) by mouth daily.   EPINEPHrine  0.3 mg/0.3 mL Soaj injection Commonly known as: EPI-PEN Inject 0.3 mg into the muscle as needed for anaphylaxis.   Febuxostat  80 MG Tabs Take 1 tablet by mouth daily.   folic acid  1 MG tablet Commonly known as: FOLVITE  Take 1 tablet (1 mg total) by mouth daily.   HYDROcodone -acetaminophen  10-325 MG tablet Commonly known as: NORCO Take 1 tablet by mouth 4 (four) times daily as needed.   magnesium  oxide 400 (240 Mg) MG tablet Commonly known as: MAG-OX Take 1 tablet (400 mg total) by mouth 2 (two) times daily.   multivitamin with minerals Tabs tablet Take 1 tablet by mouth  daily.   nortriptyline  25 MG capsule Commonly known as: PAMELOR  TAKE 2 CAPSULES (50 MG TOTAL) BY MOUTH AT BEDTIME.   Oxcarbazepine  300 MG tablet Commonly known as: TRILEPTAL  300 milligrams daily and 600 mg nightly   pantoprazole  40 MG tablet Commonly known as: Protonix  Take 1 tablet (40 mg total) by mouth daily.   thiamine  100 MG tablet Commonly known as: VITAMIN B1 Take 1 tablet (100 mg total)  by mouth daily.   Vyvgart Hytrulo 1000-10000 MG-UNT/5ML Sosy Prefilled Syringe Generic drug: efgartigimod alfa-Hyalur-qvfc once a week.        Follow-up Information     [facility] Follow up.   Contact information: ( OutPatient treatment Plan) [address] [place], [place] Walk in - [phone]- Fri) 5:30am-11AM Schedule appointment [phone]        [name], MD. Schedule an appointment as soon as possible for a visit in 1 week(s).   Specialty: Internal Medicine Contact information: [address] [name] [address] [phone]                Discharge Exam: Filed Weights   08/05/24 1811 08/05/24 2133  Weight: 99.3 kg 89.5 kg   Constitutional: NAD, calm, comfortable Eyes: PERRL, lids and conjunctivae normal ENMT: Mucous membranes are moist. Posterior pharynx clear of any exudate or lesions.Normal dentition.  Neck: normal, supple, no masses, no thyromegaly Respiratory: clear to auscultation bilaterally, no wheezing, no crackles. Normal respiratory effort. No accessory muscle use.  Cardiovascular: Regular rate and rhythm, no murmurs / rubs / gallops. No extremity edema. 2+ pedal pulses. No carotid bruits.  Abdomen: no tenderness, no masses palpated. No hepatosplenomegaly. Bowel sounds positive.  Musculoskeletal: no clubbing / cyanosis. No joint deformity upper and lower extremities. Good ROM, no contractures. Normal muscle tone.  Skin: no rashes, lesions, ulcers. No induration Neurologic: CN 2-12 grossly intact. Sensation intact, DTR normal. Strength 5/5 x all 4 extremities.  Psychiatric: Normal judgment and insight. Alert and oriented x 3. Normal mood.    Condition at discharge: good  The results of significant diagnostics from this hospitalization (including imaging, microbiology, ancillary and laboratory) are listed below for reference.   Imaging Studies: CT Head Wo Contrast Result Date: 08/05/2024 EXAM: CT HEAD WITHOUT CONTRAST 08/05/2024  06:42:49 PM TECHNIQUE: CT of the head was performed without the administration of intravenous contrast. Automated exposure control, iterative reconstruction, and/or weight based adjustment of the mA/kV was utilized to reduce the radiation dose to as low as reasonably achievable. COMPARISON: 04/12/2024 CLINICAL HISTORY: Head trauma, abnormal mental status (Age 5-64y) FINDINGS: BRAIN AND VENTRICLES: No acute hemorrhage. No evidence of acute infarct. No hydrocephalus. No extra-axial collection. No mass effect or midline shift. ORBITS: No acute abnormality. SINUSES: No acute abnormality. SOFT TISSUES AND SKULL: No acute soft tissue abnormality. No skull fracture. IMPRESSION: 1. No acute intracranial abnormality. Electronically signed by: Morgane Naveau MD 08/05/2024 06:55 PM EST RP Workstation: [id number]    Microbiology: Results for orders placed or performed during the hospital encounter of 04/12/24  MRSA Next Gen by PCR, Nasal     Status: None   Collection Time: 04/13/24  1:10 AM   Specimen: Nasal Mucosa; Nasal Swab  Result Value Ref Range Status   MRSA by PCR Next Gen NOT DETECTED NOT DETECTED Final    Comment: (NOTE) The GeneXpert MRSA Assay (FDA approved for NASAL specimens only), is one component of a comprehensive MRSA colonization surveillance program. It is not intended to diagnose MRSA infection nor to guide  or monitor treatment for MRSA infections. Test performance is not FDA approved in patients less than 66 years old. Performed at [facility], [address] [place], [place] [address]     Labs: CBC: Recent Labs  Lab 08/05/24 1820 08/05/24 1824 08/06/24 0445  WBC 10.5  --  7.6  HGB 15.7 16.3 15.0  HCT 45.0 48.0 42.3  MCV 85.6  --  85.6  PLT 122*  --  91*   Basic Metabolic Panel: Recent Labs  Lab 08/05/24 1820 08/05/24 1824 08/06/24 0445  NA 132* 129* 133*  K 2.9* 2.9* 3.0*  CL 86* 87* 93*  CO2 20*  --  29  GLUCOSE 135* 136* 75  BUN 10 9 8   CREATININE 0.80 0.80  0.54*  CALCIUM  9.4  --  8.7*  MG 1.6*  --  2.2  PHOS 3.3  --   --    Liver Function Tests: Recent Labs  Lab 08/05/24 1820 08/06/24 0445  AST 186* 185*  ALT 128* 117*  ALKPHOS 273* 224*  BILITOT 2.0* 1.7*  PROT 7.0 5.9*  ALBUMIN  4.5 4.0   CBG: Recent Labs  Lab 08/05/24 1812  GLUCAP 152*    Discharge time spent: 40 minutes.  Signed: Deliliah Room, MD [facility] 08/06/2024 "

## 2024-10-21 ENCOUNTER — Ambulatory Visit: Admitting: Student

## 2025-01-10 ENCOUNTER — Ambulatory Visit: Admitting: Neurology

## 2025-01-25 ENCOUNTER — Other Ambulatory Visit (HOSPITAL_COMMUNITY)
